# Patient Record
Sex: Female | Born: 1940 | Race: White | Hispanic: No | Marital: Married | Smoking: Former smoker
Health system: Southern US, Community
[De-identification: ages and names within clinical notes are randomized; demographics above are authoritative.]

## PROBLEM LIST (undated history)

## (undated) DIAGNOSIS — E785 Hyperlipidemia, unspecified: Secondary | ICD-10-CM

## (undated) DIAGNOSIS — N200 Calculus of kidney: Secondary | ICD-10-CM

## (undated) DIAGNOSIS — Z87442 Personal history of urinary calculi: Secondary | ICD-10-CM

## (undated) DIAGNOSIS — M8580 Other specified disorders of bone density and structure, unspecified site: Secondary | ICD-10-CM

## (undated) DIAGNOSIS — Z955 Presence of coronary angioplasty implant and graft: Secondary | ICD-10-CM

## (undated) DIAGNOSIS — N8110 Cystocele, unspecified: Secondary | ICD-10-CM

## (undated) DIAGNOSIS — K5792 Diverticulitis of intestine, part unspecified, without perforation or abscess without bleeding: Secondary | ICD-10-CM

## (undated) DIAGNOSIS — T8859XA Other complications of anesthesia, initial encounter: Secondary | ICD-10-CM

## (undated) DIAGNOSIS — M1990 Unspecified osteoarthritis, unspecified site: Secondary | ICD-10-CM

## (undated) DIAGNOSIS — H43819 Vitreous degeneration, unspecified eye: Secondary | ICD-10-CM

## (undated) DIAGNOSIS — T4145XA Adverse effect of unspecified anesthetic, initial encounter: Secondary | ICD-10-CM

## (undated) DIAGNOSIS — C50919 Malignant neoplasm of unspecified site of unspecified female breast: Secondary | ICD-10-CM

## (undated) DIAGNOSIS — G43909 Migraine, unspecified, not intractable, without status migrainosus: Secondary | ICD-10-CM

## (undated) DIAGNOSIS — K2970 Gastritis, unspecified, without bleeding: Secondary | ICD-10-CM

## (undated) DIAGNOSIS — K649 Unspecified hemorrhoids: Secondary | ICD-10-CM

## (undated) DIAGNOSIS — I2510 Atherosclerotic heart disease of native coronary artery without angina pectoris: Secondary | ICD-10-CM

## (undated) DIAGNOSIS — I639 Cerebral infarction, unspecified: Secondary | ICD-10-CM

## (undated) DIAGNOSIS — K219 Gastro-esophageal reflux disease without esophagitis: Secondary | ICD-10-CM

## (undated) DIAGNOSIS — N281 Cyst of kidney, acquired: Secondary | ICD-10-CM

## (undated) DIAGNOSIS — H269 Unspecified cataract: Secondary | ICD-10-CM

## (undated) DIAGNOSIS — D1720 Benign lipomatous neoplasm of skin and subcutaneous tissue of unspecified limb: Secondary | ICD-10-CM

## (undated) DIAGNOSIS — I38 Endocarditis, valve unspecified: Secondary | ICD-10-CM

## (undated) DIAGNOSIS — K529 Noninfective gastroenteritis and colitis, unspecified: Secondary | ICD-10-CM

## (undated) HISTORY — DX: Hyperlipidemia, unspecified: E78.5

## (undated) HISTORY — DX: Unspecified hemorrhoids: K64.9

## (undated) HISTORY — PX: PARTIAL HYSTERECTOMY: SHX80

## (undated) HISTORY — DX: Cerebral infarction, unspecified: I63.9

## (undated) HISTORY — PX: EYE SURGERY: SHX253

## (undated) HISTORY — DX: Other specified disorders of bone density and structure, unspecified site: M85.80

## (undated) HISTORY — DX: Malignant neoplasm of unspecified site of unspecified female breast: C50.919

## (undated) HISTORY — PX: CORONARY ANGIOPLASTY: SHX604

## (undated) HISTORY — DX: Cystocele, unspecified: N81.10

## (undated) HISTORY — DX: Vitreous degeneration, unspecified eye: H43.819

## (undated) HISTORY — PX: UPPER GASTROINTESTINAL ENDOSCOPY: SHX188

## (undated) HISTORY — PX: CARDIAC CATHETERIZATION: SHX172

## (undated) HISTORY — DX: Migraine, unspecified, not intractable, without status migrainosus: G43.909

## (undated) HISTORY — DX: Unspecified cataract: H26.9

## (undated) HISTORY — DX: Noninfective gastroenteritis and colitis, unspecified: K52.9

## (undated) HISTORY — PX: ABDOMINAL HYSTERECTOMY: SHX81

## (undated) HISTORY — DX: Calculus of kidney: N20.0

## (undated) HISTORY — PX: BREAST CYST ASPIRATION: SHX578

## (undated) HISTORY — PX: COLONOSCOPY: SHX174

## (undated) HISTORY — DX: Presence of coronary angioplasty implant and graft: Z95.5

## (undated) HISTORY — PX: FOOT FRACTURE SURGERY: SHX645

## (undated) HISTORY — DX: Gastritis, unspecified, without bleeding: K29.70

## (undated) HISTORY — DX: Diverticulitis of intestine, part unspecified, without perforation or abscess without bleeding: K57.92

## (undated) HISTORY — PX: CATARACT EXTRACTION: SUR2

## (undated) HISTORY — DX: Benign lipomatous neoplasm of skin and subcutaneous tissue of unspecified limb: D17.20

## (undated) HISTORY — DX: Unspecified osteoarthritis, unspecified site: M19.90

## (undated) HISTORY — DX: Gastro-esophageal reflux disease without esophagitis: K21.9

## (undated) HISTORY — PX: BREAST SURGERY: SHX581

## (undated) HISTORY — DX: Cyst of kidney, acquired: N28.1

## (undated) HISTORY — DX: Endocarditis, valve unspecified: I38

---

## 2004-07-28 ENCOUNTER — Ambulatory Visit: Payer: Self-pay | Admitting: Internal Medicine

## 2004-12-07 ENCOUNTER — Ambulatory Visit: Payer: Self-pay

## 2005-06-08 ENCOUNTER — Ambulatory Visit: Payer: Self-pay

## 2005-06-22 ENCOUNTER — Ambulatory Visit: Payer: Self-pay | Admitting: General Surgery

## 2005-08-14 DIAGNOSIS — C50919 Malignant neoplasm of unspecified site of unspecified female breast: Secondary | ICD-10-CM

## 2005-08-14 HISTORY — PX: OTHER SURGICAL HISTORY: SHX169

## 2005-08-14 HISTORY — DX: Malignant neoplasm of unspecified site of unspecified female breast: C50.919

## 2005-08-14 HISTORY — PX: BREAST BIOPSY: SHX20

## 2005-10-11 ENCOUNTER — Ambulatory Visit: Payer: Self-pay | Admitting: General Surgery

## 2006-01-10 ENCOUNTER — Ambulatory Visit: Payer: Self-pay | Admitting: General Surgery

## 2006-01-30 ENCOUNTER — Ambulatory Visit: Payer: Self-pay | Admitting: Orthopedic Surgery

## 2006-02-15 ENCOUNTER — Ambulatory Visit: Payer: Self-pay

## 2006-08-13 ENCOUNTER — Ambulatory Visit: Payer: Self-pay | Admitting: Internal Medicine

## 2007-08-15 DIAGNOSIS — D1720 Benign lipomatous neoplasm of skin and subcutaneous tissue of unspecified limb: Secondary | ICD-10-CM

## 2007-08-15 HISTORY — DX: Benign lipomatous neoplasm of skin and subcutaneous tissue of unspecified limb: D17.20

## 2007-08-15 HISTORY — PX: BREAST LUMPECTOMY: SHX2

## 2007-09-05 ENCOUNTER — Ambulatory Visit: Payer: Self-pay | Admitting: Internal Medicine

## 2007-10-09 ENCOUNTER — Ambulatory Visit: Payer: Self-pay | Admitting: Unknown Physician Specialty

## 2008-01-01 ENCOUNTER — Ambulatory Visit: Payer: Self-pay | Admitting: Physician Assistant

## 2008-01-23 ENCOUNTER — Ambulatory Visit: Payer: Self-pay | Admitting: Surgery

## 2008-02-10 ENCOUNTER — Ambulatory Visit: Payer: Self-pay | Admitting: General Surgery

## 2008-02-12 ENCOUNTER — Ambulatory Visit: Payer: Self-pay | Admitting: Internal Medicine

## 2008-03-02 ENCOUNTER — Ambulatory Visit: Payer: Self-pay | Admitting: Internal Medicine

## 2008-03-14 ENCOUNTER — Ambulatory Visit: Payer: Self-pay | Admitting: Internal Medicine

## 2008-03-26 ENCOUNTER — Other Ambulatory Visit: Payer: Self-pay

## 2008-03-27 ENCOUNTER — Ambulatory Visit: Payer: Self-pay | Admitting: Vascular Surgery

## 2008-04-14 ENCOUNTER — Ambulatory Visit: Payer: Self-pay | Admitting: Internal Medicine

## 2008-05-14 ENCOUNTER — Ambulatory Visit: Payer: Self-pay | Admitting: Internal Medicine

## 2008-06-14 ENCOUNTER — Ambulatory Visit: Payer: Self-pay | Admitting: Internal Medicine

## 2008-07-14 ENCOUNTER — Ambulatory Visit: Payer: Self-pay | Admitting: Internal Medicine

## 2008-08-14 ENCOUNTER — Ambulatory Visit: Payer: Self-pay | Admitting: Internal Medicine

## 2008-09-14 ENCOUNTER — Ambulatory Visit: Payer: Self-pay | Admitting: Internal Medicine

## 2008-10-12 ENCOUNTER — Ambulatory Visit: Payer: Self-pay | Admitting: Internal Medicine

## 2008-11-12 ENCOUNTER — Ambulatory Visit: Payer: Self-pay | Admitting: Internal Medicine

## 2008-12-12 ENCOUNTER — Ambulatory Visit: Payer: Self-pay | Admitting: Internal Medicine

## 2009-01-12 ENCOUNTER — Ambulatory Visit: Payer: Self-pay | Admitting: Internal Medicine

## 2009-02-11 ENCOUNTER — Ambulatory Visit: Payer: Self-pay | Admitting: Internal Medicine

## 2009-03-14 ENCOUNTER — Ambulatory Visit: Payer: Self-pay | Admitting: Internal Medicine

## 2009-04-14 ENCOUNTER — Ambulatory Visit: Payer: Self-pay | Admitting: Internal Medicine

## 2009-05-14 ENCOUNTER — Ambulatory Visit: Payer: Self-pay | Admitting: Internal Medicine

## 2009-05-26 ENCOUNTER — Ambulatory Visit: Payer: Self-pay | Admitting: Internal Medicine

## 2009-06-14 ENCOUNTER — Ambulatory Visit: Payer: Self-pay | Admitting: Internal Medicine

## 2009-07-14 ENCOUNTER — Ambulatory Visit: Payer: Self-pay | Admitting: Radiation Oncology

## 2009-07-14 ENCOUNTER — Ambulatory Visit: Payer: Self-pay | Admitting: Internal Medicine

## 2009-08-14 ENCOUNTER — Ambulatory Visit: Payer: Self-pay | Admitting: Radiation Oncology

## 2009-08-26 ENCOUNTER — Ambulatory Visit: Payer: Self-pay | Admitting: Internal Medicine

## 2009-09-14 ENCOUNTER — Ambulatory Visit: Payer: Self-pay | Admitting: Internal Medicine

## 2009-10-12 ENCOUNTER — Ambulatory Visit: Payer: Self-pay | Admitting: Internal Medicine

## 2009-11-12 ENCOUNTER — Ambulatory Visit: Payer: Self-pay | Admitting: Internal Medicine

## 2009-12-12 ENCOUNTER — Ambulatory Visit: Payer: Self-pay | Admitting: Internal Medicine

## 2010-01-12 ENCOUNTER — Ambulatory Visit: Payer: Self-pay | Admitting: Internal Medicine

## 2010-02-11 ENCOUNTER — Ambulatory Visit: Payer: Self-pay | Admitting: Internal Medicine

## 2010-03-14 ENCOUNTER — Ambulatory Visit: Payer: Self-pay | Admitting: Internal Medicine

## 2010-04-14 ENCOUNTER — Ambulatory Visit: Payer: Self-pay | Admitting: Internal Medicine

## 2010-05-14 ENCOUNTER — Ambulatory Visit: Payer: Self-pay | Admitting: Internal Medicine

## 2010-06-14 ENCOUNTER — Ambulatory Visit: Payer: Self-pay | Admitting: Internal Medicine

## 2010-06-16 ENCOUNTER — Ambulatory Visit: Payer: Self-pay | Admitting: Internal Medicine

## 2010-07-14 ENCOUNTER — Ambulatory Visit: Payer: Self-pay | Admitting: Internal Medicine

## 2010-08-14 ENCOUNTER — Ambulatory Visit: Payer: Self-pay | Admitting: Internal Medicine

## 2010-09-14 ENCOUNTER — Ambulatory Visit: Payer: Self-pay | Admitting: Internal Medicine

## 2010-10-13 ENCOUNTER — Ambulatory Visit: Payer: Self-pay | Admitting: Internal Medicine

## 2010-10-26 ENCOUNTER — Ambulatory Visit: Payer: Self-pay | Admitting: Ophthalmology

## 2010-12-07 ENCOUNTER — Ambulatory Visit: Payer: Self-pay | Admitting: Ophthalmology

## 2010-12-27 ENCOUNTER — Ambulatory Visit: Payer: Self-pay | Admitting: Vascular Surgery

## 2011-01-05 ENCOUNTER — Ambulatory Visit: Payer: Self-pay | Admitting: Internal Medicine

## 2011-01-13 ENCOUNTER — Ambulatory Visit: Payer: Self-pay | Admitting: Internal Medicine

## 2011-02-12 ENCOUNTER — Ambulatory Visit: Payer: Self-pay | Admitting: Internal Medicine

## 2011-03-15 ENCOUNTER — Ambulatory Visit: Payer: Self-pay | Admitting: Internal Medicine

## 2011-04-15 ENCOUNTER — Ambulatory Visit: Payer: Self-pay | Admitting: Internal Medicine

## 2011-05-15 ENCOUNTER — Ambulatory Visit: Payer: Self-pay | Admitting: Internal Medicine

## 2011-06-22 ENCOUNTER — Ambulatory Visit: Payer: Self-pay | Admitting: Internal Medicine

## 2011-07-15 ENCOUNTER — Ambulatory Visit: Payer: Self-pay | Admitting: Internal Medicine

## 2011-09-13 ENCOUNTER — Ambulatory Visit: Payer: Self-pay | Admitting: Internal Medicine

## 2011-09-15 ENCOUNTER — Ambulatory Visit: Payer: Self-pay | Admitting: Internal Medicine

## 2011-12-21 ENCOUNTER — Ambulatory Visit: Payer: Self-pay | Admitting: Internal Medicine

## 2012-01-13 ENCOUNTER — Ambulatory Visit: Payer: Self-pay | Admitting: Internal Medicine

## 2012-04-08 ENCOUNTER — Ambulatory Visit: Payer: Self-pay | Admitting: Internal Medicine

## 2012-06-20 ENCOUNTER — Ambulatory Visit: Payer: Self-pay | Admitting: Internal Medicine

## 2012-07-14 ENCOUNTER — Ambulatory Visit: Payer: Self-pay | Admitting: Internal Medicine

## 2012-08-29 ENCOUNTER — Ambulatory Visit: Payer: Self-pay | Admitting: Internal Medicine

## 2012-11-14 ENCOUNTER — Ambulatory Visit: Payer: Self-pay | Admitting: Internal Medicine

## 2012-12-12 ENCOUNTER — Ambulatory Visit: Payer: Self-pay | Admitting: Internal Medicine

## 2013-05-14 ENCOUNTER — Ambulatory Visit: Payer: Self-pay | Admitting: Internal Medicine

## 2013-05-15 ENCOUNTER — Ambulatory Visit: Payer: Self-pay | Admitting: Internal Medicine

## 2013-06-14 ENCOUNTER — Ambulatory Visit: Payer: Self-pay | Admitting: Internal Medicine

## 2013-06-19 DIAGNOSIS — I38 Endocarditis, valve unspecified: Secondary | ICD-10-CM

## 2013-06-19 HISTORY — DX: Endocarditis, valve unspecified: I38

## 2013-08-28 ENCOUNTER — Ambulatory Visit: Payer: Self-pay | Admitting: Internal Medicine

## 2013-09-14 ENCOUNTER — Ambulatory Visit: Payer: Self-pay | Admitting: Internal Medicine

## 2013-10-23 DIAGNOSIS — D649 Anemia, unspecified: Secondary | ICD-10-CM | POA: Insufficient documentation

## 2013-10-23 DIAGNOSIS — B351 Tinea unguium: Secondary | ICD-10-CM | POA: Insufficient documentation

## 2013-10-23 DIAGNOSIS — G609 Hereditary and idiopathic neuropathy, unspecified: Secondary | ICD-10-CM | POA: Insufficient documentation

## 2013-10-23 DIAGNOSIS — I10 Essential (primary) hypertension: Secondary | ICD-10-CM | POA: Insufficient documentation

## 2013-10-23 DIAGNOSIS — E785 Hyperlipidemia, unspecified: Secondary | ICD-10-CM | POA: Insufficient documentation

## 2013-12-26 ENCOUNTER — Ambulatory Visit: Payer: Self-pay | Admitting: Cardiology

## 2013-12-30 ENCOUNTER — Ambulatory Visit: Payer: Self-pay | Admitting: Cardiology

## 2013-12-30 LAB — CK TOTAL AND CKMB (NOT AT ARMC)
CK, Total: 73 U/L
CK-MB: 2.3 ng/mL (ref 0.5–3.6)

## 2013-12-31 LAB — BASIC METABOLIC PANEL
Anion Gap: 4 — ABNORMAL LOW (ref 7–16)
BUN: 10 mg/dL (ref 7–18)
CALCIUM: 8.6 mg/dL (ref 8.5–10.1)
CHLORIDE: 111 mmol/L — AB (ref 98–107)
Co2: 28 mmol/L (ref 21–32)
Creatinine: 0.87 mg/dL (ref 0.60–1.30)
EGFR (African American): >60
EGFR (Non-African Amer.): >60
GLUCOSE: 115 mg/dL — AB (ref 65–99)
OSMOLALITY: 285 (ref 275–301)
POTASSIUM: 4.5 mmol/L (ref 3.5–5.1)
Sodium: 143 mmol/L (ref 136–145)

## 2013-12-31 LAB — FOLATE: FOLIC ACID: 34.8 ng/mL (ref 3.1–100.0)

## 2013-12-31 LAB — SEDIMENTATION RATE: Erythrocyte Sed Rate: 14 mm/hr (ref 0–30)

## 2013-12-31 LAB — TSH: Thyroid Stimulating Horm: 1.7 u[IU]/mL

## 2014-01-17 DIAGNOSIS — I059 Rheumatic mitral valve disease, unspecified: Secondary | ICD-10-CM | POA: Insufficient documentation

## 2014-01-19 ENCOUNTER — Ambulatory Visit: Payer: Self-pay | Admitting: Internal Medicine

## 2014-02-27 ENCOUNTER — Ambulatory Visit: Payer: Self-pay | Admitting: Internal Medicine

## 2014-03-14 ENCOUNTER — Ambulatory Visit: Payer: Self-pay | Admitting: Internal Medicine

## 2014-06-18 ENCOUNTER — Ambulatory Visit: Payer: Self-pay | Admitting: Internal Medicine

## 2014-07-06 ENCOUNTER — Ambulatory Visit: Payer: Self-pay | Admitting: Internal Medicine

## 2014-07-21 ENCOUNTER — Ambulatory Visit: Payer: Self-pay | Admitting: Internal Medicine

## 2014-07-22 DIAGNOSIS — I2510 Atherosclerotic heart disease of native coronary artery without angina pectoris: Secondary | ICD-10-CM | POA: Insufficient documentation

## 2014-08-28 ENCOUNTER — Ambulatory Visit: Payer: Self-pay | Admitting: Internal Medicine

## 2014-09-25 ENCOUNTER — Ambulatory Visit: Payer: Self-pay | Admitting: Internal Medicine

## 2014-10-13 ENCOUNTER — Ambulatory Visit
Admit: 2014-10-13 | Disposition: A | Discharge status: Home / Self Care | Payer: Self-pay | Attending: Internal Medicine | Admitting: Internal Medicine

## 2014-12-05 NOTE — Consult Note (Signed)
Referring Physician:  Teodoro Spray   Primary Care Physician:  Sharol Given, [address], [place], [place] [address], [phone]  Reason for Consult: Admit Date: 30-Dec-2013  Chief Complaint: confusion  Reason for Consult: confusion   History of Present Illness: History of Present Illness:   74 yo RHD F presents to [facility] secondary to chest pain and underwent cardiac cath with stenting.  Pt had confusion after the cath that initially improved but then fluctuated where she was trying to get out of the bed and was looking for people who were not there.  This started immediately after the cath but improved.  Pt has not returned to baseline mental status after cath.  There is no mention of weakness/numbness, loss of consciousness or shaking activity.  ROS:  General denies complaints   HEENT no complaints   Lungs no complaints   Cardiac no complaints   GI no complaints   GU no complaints   Musculoskeletal no complaints   Extremities no complaints   Skin no complaints   Neuro no complaints   Endocrine no complaints   Psych no complaints   Past Medical/Surgical Hx:  colitis:   anxiety:   HTN:   Migrane Headaches:   Subject to Nosebleed during the Winter:   Osteoporosis:   Diverticulitis:   Gastritis:   Hypercholesterolemia:   Cancer, Breast:   lt shoulder lipoma removal:   bladder tack:   hysterectomy:   lt. rotary cuff injury:   Cataract Extraction:   Hysterectomy and Bladder Tack 1998:   Past Medical/ Surgical Hx:  Past Medical History as above   Past Surgical History as above   Home Medications: Medication Instructions Last Modified Date/Time  aspirin 81 mg oral delayed release tablet 1 tab(s) orally once a day 20-May-15 06:38  ticagrelor 90 mg oral tablet 1 tab(s) orally every 12 hours 20-May-15 06:38  ticagrelor 90 mg oral tablet 1 tab(s) orally every 12 hours 20-May-15 06:38  ALPRAZolam 0.5 mg oral tablet 0.5  tab(s) 0.$RemoveB'25mg'NsWzudMy$  orally once (at bedtime) 19-May-15 13:39  gabapentin 300 mg oral capsule 1 cap(s) orally 3 times a day x 30 days 19-May-15 13:39  Fioricet oral tablet 1/2 tablet orally  as needed 19-May-15 13:39  Vitamin D3 1000 intl units oral tablet 1 tab(s) orally once a day  19-May-15 13:39  omeprazole 20 mg oral delayed release capsule 1 cap(s) orally 2 times a day 19-May-15 13:39  Muro 128 5% ophthalmic solution  to each affected eye (left eye at bedtime) 19-May-15 13:39  Zetia 10 mg oral tablet 1 tab(s) orally once a day 19-May-15 13:39   Allergies:  Zithromax: Hives, Rash  Metronidazole: Headaches  Lidocaine: Hives, Rash  Niacin: Headaches  Colestid: Headaches  Crestor: Headaches  Zocor: Headaches  Lipitor: Other  Mevacor: Other  Atelvia: Other  Allergies:  Allergies as above   Social/Family History: Employment Status: retired  Lives With: significant other  Living Arrangements: apartment  Social History: no tob, no EtOH, no illicits  Family History: no seizures or strokes   Vital Signs: **Vital Signs.:   20-May-15 14:00  Vital Signs Type Routine  Pulse Pulse 98  Pulse source if not from Vital Sign Device per cardiac monitor  Respirations Respirations 21  Systolic BP Systolic BP 443  Diastolic BP (mmHg) Diastolic BP (mmHg) 72  Mean BP 113  BP Source  if not from Vital Sign Device non-invasive  Pulse Ox % Pulse Ox % 96  Pulse  Ox Activity Level  At rest  Oxygen Delivery Room Air/ 21 %  Pulse Ox Heart Rate 96   Physical Exam: General: thin, anxious  HEENT: normocephalic, sclera nonicteric, oropharynx clear  Neck: supple, no JVD, no bruits  Chest: CTA B, no wheezing, good movement  Cardiac: RRR, no murmurs, no edema, 2+ pulses  Extremities: no C/C/E, FROM   Neurologic Exam: Mental Status: alert and oriented x 3, normal speech and language, follows complex commands  Cranial Nerves: PERRLA, EOMI, nl VF, face symmetric, tongue midline, shoulder shrug equal   Motor Exam: 5/5 B normal, tone, no tremor, mild head titubation  Deep Tendon Reflexes: 2+/4 B, plantars downgoing B, no Hoffman  Sensory Exam: pinprick, temperature, and vibration intact B  Coordination: FTN and HTS WNL, nl RAM   Lab Results: Routine Chem:  20-May-15 04:06   Glucose, Serum  115  BUN 10  Creatinine (comp) 0.87  Sodium, Serum 143  Potassium, Serum 4.5  Chloride, Serum  111  CO2, Serum 28  Calcium (Total), Serum 8.6  Anion Gap  4  Osmolality (calc) 285  eGFR (African American) >60  eGFR (Non-African American) >60 (eGFR values <86mL/min/1.73 m2 may be an indication of chronic kidney disease (CKD). Calculated eGFR is useful in patients with stable renal function. The eGFR calculation will not be reliable in acutely ill patients when serum creatinine is changing rapidly. It is not useful in  patients on dialysis. The eGFR calculation may not be applicable to patients at the low and high extremes of body sizes, pregnant women, and vegetarians.)  Cardiac:  19-May-15 20:10   CK, Total 73 (26-192 NOTE: NEW REFERENCE RANGE  09/15/2013)  CPK-MB, Serum 2.3 (Result(s) reported on 30 Dec 2013 at 08:38PM.)   Impression/Recommendations: Recommendations:   previous notes reviewed by me reviewed by me   Encephalopathy-  no clear etiology but it has improved,  concern for stroke after stenting or even seizure activity from sedation.   Will r/o toxic/metabolic causes as well. CAD-  stable after stenting MRI of brain w/o contrast EEG check TSH, B12/folate, esr continue re-assurance will follow  Electronic Signatures: Jamison Neighbor (MD)  (Signed 20-May-15 15:04)  Authored: REFERRING PHYSICIAN, Primary Care Physician, Consult, History of Present Illness, Review of Systems, PAST MEDICAL/SURGICAL HISTORY, HOME MEDICATIONS, ALLERGIES, Social/Family History, NURSING VITAL SIGNS, Physical Exam-, LAB RESULTS, Recommendations   Last Updated: 20-May-15 15:04 by Jamison Neighbor  (MD)

## 2015-03-02 ENCOUNTER — Encounter: Payer: Self-pay | Admitting: Obstetrics and Gynecology

## 2015-03-02 ENCOUNTER — Ambulatory Visit (INDEPENDENT_AMBULATORY_CARE_PROVIDER_SITE_OTHER): Payer: Medicare Other | Admitting: Obstetrics and Gynecology

## 2015-03-02 VITALS — BP 112/67 | HR 70 | Ht 63.0 in | Wt 93.7 lb

## 2015-03-02 DIAGNOSIS — K648 Other hemorrhoids: Secondary | ICD-10-CM | POA: Diagnosis not present

## 2015-03-02 DIAGNOSIS — K644 Residual hemorrhoidal skin tags: Secondary | ICD-10-CM

## 2015-03-03 NOTE — Progress Notes (Signed)
GYNECOLOGY PROGRESS NOTE  Subjective:    Patient ID: Kathleen Walls, female    DOB: 1941/01/13, 74 y.o.   MRN: [id number]  HPI  Patient is a 74 y.o. [id number] female who presents for complaints of hemorrhoid and possible vaginal bleeding.  Notes that she had been sick several weeks ago and was taking antibiotics and steroid taper.  Afterwards began noticing having to strain with stools.  Began taking stool softeners which alleviated constipation, however began noticing spotting when wiping. Denies dysuria, hematuria.  Notes that the tried to look with a mirror to see where blood was coming from and looked as though it was either coming from the perineum or anal region.  However was unsure if it could have been vaginal.  The following portions of the patient's history were reviewed and updated as appropriate: allergies, current medications, past family history, past medical history, past social history, past surgical history and problem list.  Review of Systems Pertinent items are noted in HPI.     Objective:   Blood pressure 112/67, pulse 70, height 5\' 3"  (1.6 m), weight 93 lb 11.2 oz (42.502 kg). General appearance: alert and no distress Abdomen: soft, non-tender; bowel sounds normal; no masses,  no organomegaly Pelvic: external genitalia normal, uterus surgically absent and vagina normal without discharge. Vagina with atrophic mucosa, but no blood in vault, no lacerations or masses.  Rectal: small external hemorrhoid noted. Extremities: extremities normal, atraumatic, no cyanosis or edema Neurologic: Grossly normal   Assessment:   Hemorrhoid  Plan:   Constipation currently relieved by stool softener.  Advised on increased fiber and Preparation H/Tuck's pads for hemorrhoid.  No vaginal bleeding noted.  Given reassurance.  Patient can f/u as needed.    Rubie Maid, MD [facility]

## 2015-05-06 ENCOUNTER — Inpatient Hospital Stay: Payer: Self-pay | Admitting: Internal Medicine

## 2015-05-06 ENCOUNTER — Ambulatory Visit: Payer: Self-pay

## 2015-05-11 ENCOUNTER — Encounter: Payer: Self-pay | Admitting: *Deleted

## 2015-05-13 ENCOUNTER — Inpatient Hospital Stay: Payer: Medicare Other | Admitting: Internal Medicine

## 2015-05-14 ENCOUNTER — Inpatient Hospital Stay: Payer: Medicare Other

## 2015-05-14 ENCOUNTER — Encounter: Payer: Self-pay | Admitting: Emergency Medicine

## 2015-05-14 ENCOUNTER — Emergency Department: Payer: Medicare Other

## 2015-05-14 ENCOUNTER — Inpatient Hospital Stay
Admission: EM | Admit: 2015-05-14 | Discharge: 2015-05-14 | DRG: 311 | Disposition: A | Discharge status: Home / Self Care | Payer: Medicare Other | Attending: Internal Medicine | Admitting: Internal Medicine

## 2015-05-14 ENCOUNTER — Inpatient Hospital Stay
Admit: 2015-05-14 | Discharge: 2015-05-14 | Disposition: A | Discharge status: Home / Self Care | Payer: Medicare Other | Attending: Internal Medicine | Admitting: Internal Medicine

## 2015-05-14 ENCOUNTER — Other Ambulatory Visit: Payer: Self-pay

## 2015-05-14 DIAGNOSIS — M8580 Other specified disorders of bone density and structure, unspecified site: Secondary | ICD-10-CM | POA: Diagnosis present

## 2015-05-14 DIAGNOSIS — G43909 Migraine, unspecified, not intractable, without status migrainosus: Secondary | ICD-10-CM | POA: Diagnosis present

## 2015-05-14 DIAGNOSIS — Z888 Allergy status to other drugs, medicaments and biological substances status: Secondary | ICD-10-CM

## 2015-05-14 DIAGNOSIS — F419 Anxiety disorder, unspecified: Secondary | ICD-10-CM | POA: Diagnosis present

## 2015-05-14 DIAGNOSIS — Z79899 Other long term (current) drug therapy: Secondary | ICD-10-CM

## 2015-05-14 DIAGNOSIS — Z853 Personal history of malignant neoplasm of breast: Secondary | ICD-10-CM | POA: Diagnosis not present

## 2015-05-14 DIAGNOSIS — K219 Gastro-esophageal reflux disease without esophagitis: Secondary | ICD-10-CM | POA: Diagnosis present

## 2015-05-14 DIAGNOSIS — Z881 Allergy status to other antibiotic agents status: Secondary | ICD-10-CM | POA: Diagnosis not present

## 2015-05-14 DIAGNOSIS — I208 Other forms of angina pectoris: Secondary | ICD-10-CM

## 2015-05-14 DIAGNOSIS — E785 Hyperlipidemia, unspecified: Secondary | ICD-10-CM | POA: Diagnosis present

## 2015-05-14 DIAGNOSIS — G609 Hereditary and idiopathic neuropathy, unspecified: Secondary | ICD-10-CM | POA: Diagnosis present

## 2015-05-14 DIAGNOSIS — H269 Unspecified cataract: Secondary | ICD-10-CM | POA: Diagnosis present

## 2015-05-14 DIAGNOSIS — I10 Essential (primary) hypertension: Secondary | ICD-10-CM | POA: Diagnosis present

## 2015-05-14 DIAGNOSIS — Z955 Presence of coronary angioplasty implant and graft: Secondary | ICD-10-CM | POA: Diagnosis not present

## 2015-05-14 DIAGNOSIS — I2510 Atherosclerotic heart disease of native coronary artery without angina pectoris: Secondary | ICD-10-CM | POA: Diagnosis present

## 2015-05-14 DIAGNOSIS — I25119 Atherosclerotic heart disease of native coronary artery with unspecified angina pectoris: Secondary | ICD-10-CM | POA: Diagnosis present

## 2015-05-14 DIAGNOSIS — Z7982 Long term (current) use of aspirin: Secondary | ICD-10-CM | POA: Diagnosis not present

## 2015-05-14 DIAGNOSIS — I248 Other forms of acute ischemic heart disease: Secondary | ICD-10-CM | POA: Diagnosis not present

## 2015-05-14 DIAGNOSIS — M1990 Unspecified osteoarthritis, unspecified site: Secondary | ICD-10-CM | POA: Diagnosis present

## 2015-05-14 DIAGNOSIS — I2089 Other forms of angina pectoris: Secondary | ICD-10-CM

## 2015-05-14 DIAGNOSIS — Z87891 Personal history of nicotine dependence: Secondary | ICD-10-CM | POA: Diagnosis not present

## 2015-05-14 DIAGNOSIS — R079 Chest pain, unspecified: Secondary | ICD-10-CM | POA: Diagnosis present

## 2015-05-14 DIAGNOSIS — I214 Non-ST elevation (NSTEMI) myocardial infarction: Secondary | ICD-10-CM | POA: Diagnosis not present

## 2015-05-14 HISTORY — DX: Atherosclerotic heart disease of native coronary artery without angina pectoris: I25.10

## 2015-05-14 LAB — NM MYOCAR MULTI W/SPECT W/WALL MOTION / EF
CHL CUP NUCLEAR SDS: 0
CHL CUP RESTING HR STRESS: 70 {beats}/min
LV dias vol: 35 mL
LV sys vol: 23 mL
Peak HR: 99 {beats}/min
SRS: 1
SSS: 0
TID: 0.7

## 2015-05-14 LAB — BASIC METABOLIC PANEL
Anion gap: 6 (ref 5–15)
BUN: 18 mg/dL (ref 6–20)
CHLORIDE: 106 mmol/L (ref 101–111)
CO2: 29 mmol/L (ref 22–32)
CREATININE: 0.77 mg/dL (ref 0.44–1.00)
Calcium: 9.7 mg/dL (ref 8.9–10.3)
GFR calc Af Amer: >60 mL/min (ref >60)
GFR calc non Af Amer: >60 mL/min (ref >60)
Glucose, Bld: 99 mg/dL (ref 65–99)
POTASSIUM: 4.1 mmol/L (ref 3.5–5.1)
SODIUM: 141 mmol/L (ref 135–145)

## 2015-05-14 LAB — TROPONIN I
TROPONIN I: 0.06 ng/mL — AB (ref <0.031)
TROPONIN I: 0.05 ng/mL — AB (ref <0.031)
Troponin I: 0.07 ng/mL — ABNORMAL HIGH (ref <0.031)

## 2015-05-14 LAB — CBC
HEMATOCRIT: 44.2 % (ref 35.0–47.0)
Hemoglobin: 14.1 g/dL (ref 12.0–16.0)
MCH: 25.7 pg — AB (ref 26.0–34.0)
MCHC: 31.9 g/dL — ABNORMAL LOW (ref 32.0–36.0)
MCV: 80.5 fL (ref 80.0–100.0)
Platelets: 251 10*3/uL (ref 150–440)
RBC: 5.49 MIL/uL — AB (ref 3.80–5.20)
RDW: 15.2 % — ABNORMAL HIGH (ref 11.5–14.5)
WBC: 7.3 10*3/uL (ref 3.6–11.0)

## 2015-05-14 LAB — FIBRIN DERIVATIVES D-DIMER (ARMC ONLY): Fibrin derivatives D-dimer (ARMC): 349 (ref 0–499)

## 2015-05-14 MED ORDER — ADULT MULTIVITAMIN W/MINERALS CH
1.0000 | ORAL_TABLET | Freq: Every day | ORAL | Status: DC
  Administered 2015-05-14: 1 via ORAL
  Filled 2015-05-14: qty 1

## 2015-05-14 MED ORDER — TECHNETIUM TC 99M SESTAMIBI - CARDIOLITE
30.0000 | Freq: Once | INTRAVENOUS | Status: AC | PRN
  Administered 2015-05-14: 30.42 via INTRAVENOUS

## 2015-05-14 MED ORDER — ASPIRIN EC 81 MG PO TBEC
81.0000 mg | DELAYED_RELEASE_TABLET | Freq: Every day | ORAL | Status: DC
  Administered 2015-05-14: 81 mg via ORAL
  Filled 2015-05-14: qty 1

## 2015-05-14 MED ORDER — ACETAMINOPHEN 325 MG PO TABS: 650.0000 mg | ORAL_TABLET | Freq: Four times a day (QID) | ORAL | Status: DC | PRN

## 2015-05-14 MED ORDER — PANTOPRAZOLE SODIUM 40 MG PO TBEC
40.0000 mg | DELAYED_RELEASE_TABLET | Freq: Every day | ORAL | Status: DC
  Administered 2015-05-14: 40 mg via ORAL
  Filled 2015-05-14: qty 1

## 2015-05-14 MED ORDER — ENOXAPARIN SODIUM 60 MG/0.6ML ~~LOC~~ SOLN: 1.0000 mg/kg | Freq: Two times a day (BID) | SUBCUTANEOUS | Status: DC

## 2015-05-14 MED ORDER — METOPROLOL TARTRATE 25 MG PO TABS
12.5000 mg | ORAL_TABLET | Freq: Two times a day (BID) | ORAL | Status: DC
  Administered 2015-05-14: 12.5 mg via ORAL
  Filled 2015-05-14: qty 1

## 2015-05-14 MED ORDER — DOCUSATE SODIUM 100 MG PO CAPS
100.0000 mg | ORAL_CAPSULE | Freq: Every day | ORAL | Status: DC
  Administered 2015-05-14: 100 mg via ORAL
  Filled 2015-05-14: qty 1

## 2015-05-14 MED ORDER — SODIUM CHLORIDE 0.9 % IJ SOLN: 3.0000 mL | INTRAMUSCULAR | Status: DC | PRN

## 2015-05-14 MED ORDER — ALPRAZOLAM 0.25 MG PO TABS: 0.1250 mg | ORAL_TABLET | Freq: Every day | ORAL | Status: DC

## 2015-05-14 MED ORDER — ENOXAPARIN SODIUM 100 MG/ML ~~LOC~~ SOLN
SUBCUTANEOUS | Status: AC
  Filled 2015-05-14: qty 1

## 2015-05-14 MED ORDER — NITROGLYCERIN 0.4 MG SL SUBL
SUBLINGUAL_TABLET | SUBLINGUAL | Status: AC
  Administered 2015-05-14: 0.4 mg via SUBLINGUAL
  Filled 2015-05-14: qty 1

## 2015-05-14 MED ORDER — CALCIUM CARBONATE ANTACID 500 MG PO CHEW
1.0000 | CHEWABLE_TABLET | Freq: Every day | ORAL | Status: DC
  Administered 2015-05-14: 200 mg via ORAL
  Filled 2015-05-14: qty 1

## 2015-05-14 MED ORDER — SODIUM CHLORIDE 0.9 % IJ SOLN: 3.0000 mL | Freq: Two times a day (BID) | INTRAMUSCULAR | Status: DC

## 2015-05-14 MED ORDER — EZETIMIBE 10 MG PO TABS
10.0000 mg | ORAL_TABLET | Freq: Every day | ORAL | Status: DC
  Administered 2015-05-14: 10 mg via ORAL
  Filled 2015-05-14 (×2): qty 1

## 2015-05-14 MED ORDER — ASPIRIN 81 MG PO CHEW
324.0000 mg | CHEWABLE_TABLET | Freq: Once | ORAL | Status: AC
  Administered 2015-05-14: 324 mg via ORAL

## 2015-05-14 MED ORDER — NITROGLYCERIN 0.4 MG SL SUBL
0.4000 mg | SUBLINGUAL_TABLET | SUBLINGUAL | Status: DC | PRN
  Administered 2015-05-14: 0.4 mg via SUBLINGUAL

## 2015-05-14 MED ORDER — ASPIRIN 81 MG PO CHEW
CHEWABLE_TABLET | ORAL | Status: AC
  Administered 2015-05-14: 324 mg via ORAL
  Filled 2015-05-14: qty 4

## 2015-05-14 MED ORDER — SODIUM CHLORIDE 0.9 % IJ SOLN
3.0000 mL | Freq: Two times a day (BID) | INTRAMUSCULAR | Status: DC
  Administered 2015-05-14: 3 mL via INTRAVENOUS

## 2015-05-14 MED ORDER — ASPIRIN EC 81 MG PO TBEC: 81.0000 mg | DELAYED_RELEASE_TABLET | Freq: Every day | ORAL | Status: DC

## 2015-05-14 MED ORDER — INFLUENZA VAC SPLIT QUAD 0.5 ML IM SUSY: 0.5000 mL | PREFILLED_SYRINGE | INTRAMUSCULAR | Status: DC

## 2015-05-14 MED ORDER — GABAPENTIN 300 MG PO CAPS
300.0000 mg | ORAL_CAPSULE | Freq: Three times a day (TID) | ORAL | Status: DC
  Administered 2015-05-14 (×2): 300 mg via ORAL
  Filled 2015-05-14 (×2): qty 1

## 2015-05-14 MED ORDER — REGADENOSON 0.4 MG/5ML IV SOLN
0.4000 mg | Freq: Once | INTRAVENOUS | Status: AC
  Administered 2015-05-14: 0.4 mg via INTRAVENOUS
  Filled 2015-05-14: qty 5

## 2015-05-14 MED ORDER — SODIUM CHLORIDE 0.9 % IV SOLN: 250.0000 mL | INTRAVENOUS | Status: DC | PRN

## 2015-05-14 MED ORDER — TECHNETIUM TC 99M SESTAMIBI - CARDIOLITE
13.0000 | Freq: Once | INTRAVENOUS | Status: AC | PRN
  Administered 2015-05-14: 12.04 via INTRAVENOUS

## 2015-05-14 MED ORDER — ACETAMINOPHEN 650 MG RE SUPP: 650.0000 mg | Freq: Four times a day (QID) | RECTAL | Status: DC | PRN

## 2015-05-14 MED ORDER — ENOXAPARIN SODIUM 40 MG/0.4ML ~~LOC~~ SOLN
40.0000 mg | Freq: Once | SUBCUTANEOUS | Status: AC
  Administered 2015-05-14: 40 mg via SUBCUTANEOUS
  Filled 2015-05-14: qty 0.4

## 2015-05-14 NOTE — ED Provider Notes (Signed)
[facility] Emergency Department Provider Note  ____________________________________________  Time seen: 4:00 AM  I have reviewed the triage vital signs and the nursing notes.   HISTORY  Chief Complaint Chest Pain      HPI Kathleen Walls is a 74 y.o. female presents with acute onset of left chest pain on awakening approximately one hour ago. Patient states that the pain is nonradiating. Patient does admit to dyspnea no diaphoresis no nausea or vomiting. Patient states current pain score is 7 out of 10. Of note patient has a history of a cardiac stent performed by Dr. Ubaldo Glassing as well as valvular heart disease.     Past Medical History  Diagnosis Date  . Migraine   . Diverticulitis   . Gastritis   . Hyperlipemia   . Colitis   . GERD (gastroesophageal reflux disease)   . Osteoarthritis   . Hemorrhoid   . 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 Kidney cysts     Left  . Kidney stones   . 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 Breast cancer 2009     2009 breast cancer stage IIA T2 N0, ER positive, HER-2 negative, 2.5 cm primary tumor, poorly differentiated, status post wide local excision, one sentinel lymph node negative, high-risk Oncotype score 48  . Lipoma of shoulder 2009    left  . Hemorrhoids   . Arthritis   . Osteopenia   . Cataracts, both eyes   . Detached vitreous humor     Patient Active Problem List   Diagnosis Date Noted  . Arteriosclerosis of coronary artery 07/22/2014  . Disorder of mitral valve 01/17/2014  . Absolute anemia 10/23/2013  . Benign hypertension 10/23/2013  . Dermatophytic onychia 10/23/2013  . HLD (hyperlipidemia) 10/23/2013  . Idiopathic peripheral neuropathy 10/23/2013    Past Surgical History  Procedure Laterality Date  . Benign breast biopsy  2007  . Partial hysterectomy    . Upper gastrointestinal endoscopy    . Colonoscopy       Current Outpatient Rx  Name  Route  Sig  Dispense  Refill  . ALPRAZolam (XANAX) 0.25 MG tablet      TAKE ONE-HALF TABLET BY MOUTH AT BEDTIME         . aspirin EC 81 MG tablet   Oral   Take by mouth.         . Azelaic Acid 15 % cream   Topical   Apply topically.         . butalbital-aspirin-caffeine (FIORINAL) 50-325-40 MG per capsule   Oral   Take by mouth.         . calcium carbonate (TUMS - DOSED IN MG ELEMENTAL CALCIUM) 500 MG chewable tablet   Oral   Chew by mouth.         . Cholecalciferol (VITAMIN D-1000 MAX ST) 1000 UNITS tablet   Oral   Take by mouth.         . docusate sodium (STOOL SOFTENER) 100 MG capsule   Oral   Take by mouth.         . ezetimibe (ZETIA) 10 MG tablet   Oral   Take by mouth.         . gabapentin (NEURONTIN) 300 MG capsule      TAKE ONE CAPSULE BY MOUTH THREE TIMES DAILY         .  Multiple Vitamin (MULTI-VITAMINS) TABS   Oral   Take by mouth.         Marland Kitchen omeprazole (PRILOSEC) 20 MG capsule   Oral   Take 20 mg by mouth 2 (two) times daily before a meal.           Allergies Alendronate sodium; Atorvastatin; Azithromycin; Ciprofloxacin; Colestipol; Lidocaine; Lovastatin; Metronidazole; Niacin; Risedronate sodium; Rosuvastatin; and Simvastatin  Family History  Problem Relation Age of Onset  . Heart disease Mother   . Diabetes Father   . Heart disease Father   . Diabetes Sister   . Diabetes Brother   . Cervical cancer Cousin   . Lung cancer Paternal Aunt   . Prostate cancer Maternal Uncle   . Leukemia    . Hypertension      Social History Social History  Substance Use Topics  . Smoking status: Former Smoker    Types: Cigarettes    Quit date: 08/14/1993  . Smokeless tobacco: Never Used  . Alcohol Use: No    Review of Systems  Constitutional: Negative for fever. Eyes: Negative for visual changes. ENT: Negative for sore throat. Cardiovascular: Positive for chest pain. Respiratory: Negative  for shortness of breath. Gastrointestinal: Negative for abdominal pain, vomiting and diarrhea. Genitourinary: Negative for dysuria. Musculoskeletal: Negative for back pain. Skin: Negative for rash. Neurological: Negative for headaches, focal weakness or numbness.   10-point ROS otherwise negative.  ____________________________________________   PHYSICAL EXAM:  VITAL SIGNS: ED Triage Vitals  Enc Vitals Group     BP 05/14/15 0407 150/98 mmHg     Pulse Rate 05/14/15 0407 72     Resp 05/14/15 0407 18     Temp 05/14/15 0407 97.6 F (36.4 C)     Temp Source 05/14/15 0407 Oral     SpO2 05/14/15 0407 98 %     Weight 05/14/15 0404 93 lb (42.185 kg)     Height 05/14/15 0404 $RemoveBefor'5\' 3"'WHQKsqSEecaH$  (1.6 m)     Head Cir --      Peak Flow --      Pain Score 05/14/15 0407 5     Pain Loc --      Pain Edu? --      Excl. in [place]? --      Constitutional: Alert and oriented. Well appearing and in no distress. Eyes: Conjunctivae are normal. PERRL. Normal extraocular movements. ENT   Head: Normocephalic and atraumatic.   Nose: No congestion/rhinnorhea.   Mouth/Throat: Mucous membranes are moist.   Neck: No stridor. Cardiovascular: Normal rate, regular rhythm. Normal and symmetric distal pulses are present in all extremities. No murmurs, rubs, or gallops. Respiratory: Normal respiratory effort without tachypnea nor retractions. Breath sounds are clear and equal bilaterally. No wheezes/rales/rhonchi. Gastrointestinal: Soft and nontender. No distention. There is no CVA tenderness. Genitourinary: deferred Musculoskeletal: Nontender with normal range of motion in all extremities. No joint effusions.  No lower extremity tenderness nor edema. Neurologic:  Normal speech and language. No gross focal neurologic deficits are appreciated. Speech is normal.  Skin:  Skin is warm, dry and intact. No rash noted. Psychiatric: Mood and affect are normal. Speech and behavior are normal. Patient exhibits  appropriate insight and judgment.  ____________________________________________    LABS (pertinent positives/negatives)  Labs Reviewed  CBC - Abnormal; Notable for the following:    RBC 5.49 (*)    MCH 25.7 (*)    MCHC 31.9 (*)    RDW 15.2 (*)    All other components within normal limits  TROPONIN I - Abnormal; Notable for the following:    Troponin I 0.07 (*)    All other components within normal limits  BASIC METABOLIC PANEL  FIBRIN DERIVATIVES D-DIMER ([facility] ONLY)     ____________________________________________   EKG  ED ECG REPORT I, BROWN, [place] N, the attending physician, personally viewed and interpreted this ECG.   Date: 05/14/2015  EKG Time: 4:07 AM  Rate: 77  Rhythm: Normal sinus rhythm  Axis: Normal  Intervals: Normal  ST&T Change: None   ____________________________________________    RADIOLOGY      DG Chest Port 1 View (Final result) Result time: 05/14/15 04:42:34   Final result by Rad Results In Interface (05/14/15 04:42:34)   Narrative:   CLINICAL DATA: Chest pain  EXAM: PORTABLE CHEST 1 VIEW  COMPARISON: 07/21/2014 CT  FINDINGS: Normal heart size and aortic contour. Mass at the right cardiophrenic sulcus is an incidental pericardial cyst based on 2015 CT. Generous lung volumes with no emphysema on previous CT. There is no edema, consolidation, effusion, or pneumothorax.  There are 2 rounded densities overlapping the proximal right humerus which are not seen on 2011 chest x-ray. These are favored artifactual but indeterminate.  IMPRESSION: 1. No evidence of acute cardiopulmonary disease. 2. 2 densities over the right humerus are favored artifactual, but dedicated humerus radiography is recommended to exclude new sclerotic lesions.   Electronically Signed By: Monte Fantasia M.D. On: 05/14/2015 04:42       ____________________________________________   PROCEDURES    Critical Care performed: CRITICAL  CARE Performed by: Marjean Donna N   Total critical care time: 30 minutes  Critical care time was exclusive of separately billable procedures and treating other patients.  Critical care was necessary to treat or prevent imminent or life-threatening deterioration.  Critical care was time spent personally by me on the following activities: development of treatment plan with patient and/or surrogate as well as nursing, discussions with consultants, evaluation of patient's response to treatment, examination of patient, obtaining history from patient or surrogate, ordering and performing treatments and interventions, ordering and review of laboratory studies, ordering and review of radiographic studies, pulse oximetry and re-evaluation of patient's condition.   ____________________________________________   INITIAL IMPRESSION / ASSESSMENT AND PLAN / ED COURSE  Pertinent labs & imaging results that were available during my care of the patient were reviewed by me and considered in my medical decision making (see chart for details).    ____________________________________________   FINAL CLINICAL IMPRESSION(S) / ED DIAGNOSES  Final diagnoses:  NSTEMI (non-ST elevated myocardial infarction)        Gregor Hams, MD 05/14/15 [id number]

## 2015-05-14 NOTE — Progress Notes (Signed)
Kathleen Walls is a 74 y.o. female  Chest pain   SUBJECTIVE:  Pt admitted with CP and SOB with troponin=0.07. Currently pain-free. Has hx of PTCA. EKG non-acute. CXR stable.  ______________________________________________________________________  ROS: Review of systems is unremarkable for any active cardiac,respiratory, GI, GU, hematologic, neurologic or psychiatric systems, 10 systems reviewed.  $RemoveBe'@CMEDLIST'IlwHktCjj$ @  Past Medical History  Diagnosis Date  . Migraine   . Diverticulitis   . Gastritis   . Hyperlipemia   . Colitis   . GERD (gastroesophageal reflux disease)   . Osteoarthritis   . Hemorrhoid   . 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 Kidney cysts     Left  . Kidney stones   . 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 Breast cancer 2009     2009 breast cancer stage IIA T2 N0, ER positive, HER-2 negative, 2.5 cm primary tumor, poorly differentiated, status post wide local excision, one sentinel lymph node negative, high-risk Oncotype score 48  . Lipoma of shoulder 2009    left  . Hemorrhoids   . Arthritis   . Osteopenia   . Cataracts, both eyes   . Detached vitreous humor   . Coronary artery disease   . Hypertension     Past Surgical History  Procedure Laterality Date  . Benign breast biopsy  2007  . Partial hysterectomy    . Upper gastrointestinal endoscopy    . Colonoscopy    . Abdominal hysterectomy    . Breast surgery    . Eye surgery    . Coronary angioplasty      PHYSICAL EXAM:  BP 159/70 mmHg  Pulse 72  Temp(Src) 98 F (36.7 C) (Oral)  Resp 18  Ht $R'5\' 3"'or$  (1.6 m)  Wt 43.3 kg (95 lb 7.4 oz)  BMI 16.91 kg/m2  SpO2 99%  Wt Readings from Last 3 Encounters:  05/14/15 43.3 kg (95 lb 7.4 oz)  03/02/15 42.502 kg (93 lb 11.2 oz)            Constitutional: NAD Neck: supple, no thyromegaly Respiratory: CTA, no rales or wheezes Cardiovascular: RRR, no murmur, no gallop Abdomen:  soft, good BS, nontender Extremities: no edema Neuro: alert and oriented, no focal motor or sensory deficits  ASSESSMENT/PLAN:  Labs and imaging studies were reviewed  Will follow enzymes. Echo and Cardiology consult today. Plans per Cardiology.

## 2015-05-14 NOTE — Progress Notes (Signed)
Discharge instructions explained to pt/ verbalized an understanding/ iv and tele removed/ transported off uint via wheelchair.

## 2015-05-14 NOTE — H&P (Signed)
[facility] NAME: Kathleen Walls    MR#:  [id number]  DATE OF BIRTH:  29-Nov-1940  DATE OF ADMISSION:  05/14/2015  PRIMARY CARE PHYSICIAN: Idelle Crouch, MD   REQUESTING/REFERRING PHYSICIAN: Dr. Owens Shark  CHIEF COMPLAINT:   Chief Complaint  Patient presents with  . Chest Pain    HISTORY OF PRESENT ILLNESS:  Kathleen Walls  is a 74 y.o. female with a known history of coronary artery disease status post RCA stent, hypertension, hyperlipidemia, idiopathic peripheral neuropathy presents to the emergency room with the complaints of acute onset of chest pain that woke her up from sleep. Chest pain is pressure type in nature, no radiation of pain, associated with shortness of breath and nausea. No associated palpitations, dizziness, vomitings. Evaluation in the ED revealed stable vital signs and lab work was significant for elevated troponin of 0.07. Chest x-ray negative for acute cardiopulmonary pathology. EKG normal sinus rhythm with ventricular rate of 77 bpm. Patient received aspirin and sublingual nitroglycerin following which her chest pain resolved completely and remained chest pain-free at this time. Patient was also started on therapeutic dose of subcutaneous Lovenox and hospitalist service was consulted for further management.  PAST MEDICAL HISTORY:   Past Medical History  Diagnosis Date  . Migraine   . Diverticulitis   . Gastritis   . Hyperlipemia   . Colitis   . GERD (gastroesophageal reflux disease)   . Osteoarthritis   . Hemorrhoid   . 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 Kidney cysts     Left  . Kidney stones   . 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 Breast cancer 2009     2009 breast cancer stage IIA T2 N0, ER positive, HER-2 negative, 2.5 cm primary tumor, poorly differentiated, status post wide local excision, one sentinel lymph node  negative, high-risk Oncotype score 48  . Lipoma of shoulder 2009    left  . Hemorrhoids   . Arthritis   . Osteopenia   . Cataracts, both eyes   . Detached vitreous humor   . Coronary artery disease   . Hypertension     PAST SURGICAL HISTORY:   Past Surgical History  Procedure Laterality Date  . Benign breast biopsy  2007  . Partial hysterectomy    . Upper gastrointestinal endoscopy    . Colonoscopy    . Abdominal hysterectomy    . Breast surgery    . Eye surgery    . Coronary angioplasty      SOCIAL HISTORY:   Social History  Substance Use Topics  . Smoking status: Former Smoker    Types: Cigarettes    Quit date: 08/14/1993  . Smokeless tobacco: Never Used  . Alcohol Use: No    FAMILY HISTORY:   Family History  Problem Relation Age of Onset  . Heart disease Mother   . Diabetes Father   . Heart disease Father   . Diabetes Sister   . Diabetes Brother   . Cervical cancer Cousin   . Lung cancer Paternal Aunt   . Prostate cancer Maternal Uncle   . Leukemia    . Hypertension      DRUG ALLERGIES:   Allergies  Allergen Reactions  . Alendronate Sodium     Other reaction(s): Unknown  . Atorvastatin     Other reaction(s): Muscle Pain  .  Azithromycin Hives  . Ciprofloxacin     Other reaction(s): Headache  . Colestipol     Other reaction(s): Unknown  . Lidocaine Hives  . Lovastatin     Other reaction(s): Headache  . Metronidazole     Other reaction(s): Headache  . Niacin     Other reaction(s): Unknown  . Risedronate Sodium Other (See Comments)  . Rosuvastatin     Other reaction(s): Unknown  . Simvastatin     Other reaction(s): Headache    REVIEW OF SYSTEMS:   Review of Systems  Constitutional: Negative for fever, chills and malaise/fatigue.  HENT: Negative for ear pain, hearing loss, nosebleeds, sore throat and tinnitus.   Eyes: Negative for blurred vision, double vision, pain, discharge and redness.  Respiratory: Positive for shortness of  breath. Negative for cough, hemoptysis, sputum production and wheezing.   Cardiovascular: Positive for chest pain. Negative for palpitations, orthopnea and leg swelling.  Gastrointestinal: Positive for nausea. Negative for vomiting, abdominal pain, diarrhea, constipation, blood in stool and melena.  Genitourinary: Negative for dysuria, urgency, frequency and hematuria.  Musculoskeletal: Negative for back pain, joint pain and neck pain.  Skin: Negative for itching and rash.  Neurological: Negative for dizziness, tingling, sensory change, focal weakness and seizures.  Endo/Heme/Allergies: Does not bruise/bleed easily.  Psychiatric/Behavioral: Negative for depression. The patient is not nervous/anxious.     MEDICATIONS AT HOME:   Prior to Admission medications   Medication Sig Start Date End Date Taking? Authorizing Arraya Buck  ALPRAZolam Duanne Moron) 0.25 MG tablet TAKE ONE-HALF TABLET BY MOUTH AT BEDTIME 01/17/15  Yes Historical Deziyah Arvin, MD  aspirin EC 81 MG tablet Take by mouth.   Yes Historical Svetlana Bagby, MD  Azelaic Acid 15 % cream Apply topically.   Yes Historical Trianna Lupien, MD  butalbital-aspirin-caffeine [facility]) 50-325-40 MG per capsule Take by mouth.   Yes Historical Zohaib Heeney, MD  calcium carbonate (TUMS - DOSED IN MG ELEMENTAL CALCIUM) 500 MG chewable tablet Chew by mouth.   Yes Historical Sani Madariaga, MD  Cholecalciferol (VITAMIN D-1000 MAX ST) 1000 UNITS tablet Take by mouth.   Yes Historical Seleena Reimers, MD  docusate sodium (STOOL SOFTENER) 100 MG capsule Take by mouth.   Yes Historical Nyjai Graff, MD  ezetimibe (ZETIA) 10 MG tablet Take by mouth. 03/01/15  Yes Historical Jamyron Redd, MD  gabapentin (NEURONTIN) 300 MG capsule TAKE ONE CAPSULE BY MOUTH THREE TIMES DAILY 01/09/15  Yes Historical Law Corsino, MD  Multiple Vitamin (MULTI-VITAMINS) TABS Take by mouth.   Yes Historical Rocco Kerkhoff, MD  omeprazole (PRILOSEC) 20 MG capsule Take 20 mg by mouth 2 (two) times daily before a meal.   Yes Historical  Kalasia Crafton, MD      VITAL SIGNS:  Blood pressure 185/71, pulse 69, temperature 97.6 F (36.4 C), temperature source Oral, resp. rate 21, height $RemoveBe'5\' 3"'GKHarbUkU$  (1.6 m), weight 43.3 kg (95 lb 7.4 oz), SpO2 100 %.  PHYSICAL EXAMINATION:  Physical Exam  Constitutional: She is oriented to person, place, and time. She appears well-developed and well-nourished. No distress.  HENT:  Head: Normocephalic and atraumatic.  Right Ear: External ear normal.  Left Ear: External ear normal.  Nose: Nose normal.  Mouth/Throat: Oropharynx is clear and moist. No oropharyngeal exudate.  Eyes: EOM are normal. Pupils are equal, round, and reactive to light. No scleral icterus.  Neck: Normal range of motion. Neck supple. No JVD present. No thyromegaly present.  Cardiovascular: Normal rate, regular rhythm, normal heart sounds and intact distal pulses.  Exam reveals no friction rub.   No murmur heard. Respiratory:  Effort normal and breath sounds normal. No respiratory distress. She has no wheezes. She has no rales. She exhibits no tenderness.  GI: Soft. Bowel sounds are normal. She exhibits no distension and no mass. There is no tenderness. There is no rebound and no guarding.  Musculoskeletal: Normal range of motion. She exhibits no edema.  Lymphadenopathy:    She has no cervical adenopathy.  Neurological: She is alert and oriented to person, place, and time. She has normal reflexes. She displays normal reflexes. No cranial nerve deficit. She exhibits normal muscle tone.  Skin: Skin is warm. No rash noted. No erythema.  Psychiatric: She has a normal mood and affect. Her behavior is normal. Thought content normal.   LABORATORY PANEL:   CBC  Recent Labs Lab 05/14/15 0415  WBC 7.3  HGB 14.1  HCT 44.2  PLT 251   ------------------------------------------------------------------------------------------------------------------  Chemistries   Recent Labs Lab 05/14/15 0415  NA 141  K 4.1  CL 106  CO2 29   GLUCOSE 99  BUN 18  CREATININE 0.77  CALCIUM 9.7   ------------------------------------------------------------------------------------------------------------------  Cardiac Enzymes  Recent Labs Lab 05/14/15 0415  TROPONINI 0.07*   ------------------------------------------------------------------------------------------------------------------  RADIOLOGY:  Dg Chest Port 1 View  05/14/2015   CLINICAL DATA:  Chest pain  EXAM: PORTABLE CHEST 1 VIEW  COMPARISON:  07/21/2014 CT  FINDINGS: Normal heart size and aortic contour. Mass at the right cardiophrenic sulcus is an incidental pericardial cyst based on 2015 CT. Generous lung volumes with no emphysema on previous CT. There is no edema, consolidation, effusion, or pneumothorax.  There are 2 rounded densities overlapping the proximal right humerus which are not seen on 2011 chest x-ray. These are favored artifactual but indeterminate.  IMPRESSION: 1. No evidence of acute cardiopulmonary disease. 2. 2 densities over the right humerus are favored artifactual, but dedicated humerus radiography is recommended to exclude new sclerotic lesions.   Electronically Signed   By: Monte Fantasia M.D.   On: 05/14/2015 04:42    EKG:   Orders placed or performed during the hospital encounter of 05/14/15  . ED EKG  . ED EKG  Normal sinus rhythm with ventricular rate of 77 bpm.  IMPRESSION AND PLAN:   1. Left-sided chest pain with associated shortness of breath, elevated troponin of 0.07-non-STEMI. 2. Coronary artery disease status post RCA stent in the past. 3. Hypertension. 4. Hyperlipidemia, on Zetia. History of statin intolerance. 5. Idiopathic peripheral neuropathy, on gabapentin. Plan: Admit to telemetry, continue therapeutic Lovenox, aspirin, nitroglycerin when necessary, start low-dose metoprolol, cycle cardiac enzymes. Order echocardiogram and cardiology consultation for further evaluation and advice. Continue Zetia and other home  medications.  DVT prophylaxis: Subcutaneous Lovenox    All the records are reviewed and case discussed with ED Somaly Marteney. Management plans discussed with the patient, family and they are in agreement.  CODE STATUS: Full code  TOTAL TIME TAKING CARE OF THIS PATIENT: 50 minutes.    Azucena Freed N M.D on 05/14/2015 at 6:12 AM  Between 7am to 6pm - Pager - [phone]  After 6pm go to [email or web address] - password [email or web address]  Office  [phone]  CC: Primary care physician; Idelle Crouch, MD

## 2015-05-14 NOTE — Progress Notes (Signed)
Skin checked w Clois Dupes RN

## 2015-05-14 NOTE — Progress Notes (Signed)
Per Dr. Clayborn Bigness - stress test neg- Dr. Doy Hutching paged to make aware- waiting for call back

## 2015-05-14 NOTE — Discharge Instructions (Signed)
For any further symptoms contact Dr Doy Hutching

## 2015-05-14 NOTE — Care Management (Signed)
Order present for care management consult.  Patient presents from home where she lives with her husband.   Prior to this admission, patient independent in all adls, denies  problems accessing medical care or obtaining  medications.  Occasionally drives.  Does not have chronic home 02.  Has history of cardiac stents and has a leaky valve.  Cardiology consult is pending.  Currently on heparin drip for slightly elevated troponin.  At present there are no discharge needs.

## 2015-05-14 NOTE — Progress Notes (Signed)
Dr. Sabra Heck paged to make aware of neg stress test- states he would look at it

## 2015-05-14 NOTE — Consult Note (Signed)
Reason for Consult: angina coronary disease borderline Referring Physician: Dr  Kathleen Walls [name] is an 74 y.o. female.  HPI:  Patient who known coronary disease history of PCI and stent to the RCA hypertension hyperlipidemia in the previous breast cancer with chemotherapy subsequent idiopathic peripheral neuropathy presents with chest pressure tightness shortness of breath. The patient came to emergency room was found have borderline troponins EKG was nondiagnostic patient subsequently became pain free on nitroglycerin and was admitted for further evaluation placed on anticoagulation cardiologist recommend further evaluation.  Past Medical History  Diagnosis Date  . Migraine   . Diverticulitis   . Gastritis   . Hyperlipemia   . Colitis   . GERD (gastroesophageal reflux disease)   . Osteoarthritis   . Hemorrhoid   . 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 Kidney cysts     Left  . Kidney stones   . 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 Breast cancer 2009     2009 breast cancer stage IIA T2 N0, ER positive, HER-2 negative, 2.5 cm primary tumor, poorly differentiated, status post wide local excision, one sentinel lymph node negative, high-risk Oncotype score 48  . Lipoma of shoulder 2009    left  . Hemorrhoids   . Arthritis   . Osteopenia   . Cataracts, both eyes   . Detached vitreous humor   . Coronary artery disease   . Hypertension     Past Surgical History  Procedure Laterality Date  . Benign breast biopsy  2007  . Partial hysterectomy    . Upper gastrointestinal endoscopy    . Colonoscopy    . Abdominal hysterectomy    . Breast surgery    . Eye surgery    . Coronary angioplasty      Family History  Problem Relation Age of Onset  . Heart disease Mother   . Diabetes Father   . Heart disease Father   . Diabetes Sister   . Diabetes Brother   . Cervical cancer Cousin   . Lung  cancer Paternal Aunt   . Prostate cancer Maternal Uncle   . Leukemia    . Hypertension      Social History:  reports that she quit smoking about 21 years ago. Her smoking use included Cigarettes. She has never used smokeless tobacco. She reports that she does not drink alcohol or use illicit drugs.  Allergies:  Allergies  Allergen Reactions  . Alendronate Sodium     Other reaction(s): Unknown  . Atorvastatin     Other reaction(s): Muscle Pain  . Azithromycin Hives  . Ciprofloxacin     Other reaction(s): Headache  . Colestipol     Other reaction(s): Unknown  . Lidocaine Hives  . Lovastatin     Other reaction(s): Headache  . Metronidazole     Other reaction(s): Headache  . Niacin     Other reaction(s): Unknown  . Risedronate Sodium Other (See Comments)  . Rosuvastatin     Other reaction(s): Unknown  . Simvastatin     Other reaction(s): Headache    Medications: I have reviewed the patient's current medications.  Results for orders placed or performed during the hospital encounter of 05/14/15 (from the past 48 hour(s))  Basic metabolic panel     Status: None   Collection Time: 05/14/15  4:15 AM  Result Value Ref Range  Sodium 141 135 - 145 mmol/L   Potassium 4.1 3.5 - 5.1 mmol/L   Chloride 106 101 - 111 mmol/L   CO2 29 22 - 32 mmol/L   Glucose, Bld 99 65 - 99 mg/dL   BUN 18 6 - 20 mg/dL   Creatinine, Ser 0.77 0.44 - 1.00 mg/dL   Calcium 9.7 8.9 - 10.3 mg/dL   GFR calc non Af Amer >60 >60 mL/min   GFR calc Af Amer >60 >60 mL/min    Comment: (NOTE) The eGFR has been calculated using the CKD EPI equation. This calculation has not been validated in all clinical situations. eGFR's persistently <60 mL/min signify possible Chronic Kidney Disease.    Anion gap 6 5 - 15  CBC     Status: Abnormal   Collection Time: 05/14/15  4:15 AM  Result Value Ref Range   WBC 7.3 3.6 - 11.0 K/uL   RBC 5.49 (H) 3.80 - 5.20 MIL/uL   Hemoglobin 14.1 12.0 - 16.0 g/dL   HCT 44.2 35.0 -  47.0 %   MCV 80.5 80.0 - 100.0 fL   MCH 25.7 (L) 26.0 - 34.0 pg   MCHC 31.9 (L) 32.0 - 36.0 g/dL   RDW 15.2 (H) 11.5 - 14.5 %   Platelets 251 150 - 440 K/uL  Troponin I     Status: Abnormal   Collection Time: 05/14/15  4:15 AM  Result Value Ref Range   Troponin I 0.07 (H) <0.031 ng/mL    Comment: READ BACK AND VERIFIED Kathleen Walls 05/14/2015 0447 [facility]        PERSISTENTLY INCREASED TROPONIN VALUES IN THE RANGE OF 0.04-0.49 ng/mL CAN BE SEEN IN:       -UNSTABLE ANGINA       -CONGESTIVE HEART FAILURE       -MYOCARDITIS       -CHEST TRAUMA       -ARRYHTHMIAS       -LATE PRESENTING MYOCARDIAL INFARCTION       -COPD   CLINICAL FOLLOW-UP RECOMMENDED.   Fibrin derivatives D-Dimer ([facility] only)     Status: None   Collection Time: 05/14/15  4:15 AM  Result Value Ref Range   Fibrin derivatives D-dimer ([facility]) 349 0 - 499    Comment: <> Exclusion of Venous Thromboembolism (VTE) - OUTPATIENTS ONLY        (Emergency Department or [place])             0-499 ng/ml (FEU)  : With a low to intermediate pretest                                        probability for VTE this test result                                        excludes the diagnosis of VTE.           > 499 ng/ml (FEU)  : VTE not excluded.  Additional work up                                   for VTE is required.   <>  Testing on Inpatients and Evaluation of Disseminated Intravascular  Coagulation (DIC)             Reference Range:   0-499 ng/ml (FEU)   Troponin I     Status: Abnormal   Collection Time: 05/14/15  7:07 AM  Result Value Ref Range   Troponin I 0.06 (H) <0.031 ng/mL    Comment: RESULTS PREVIOUSLY CALLED TO Kathleen Walls AT 0447 ON 05/14/15 BY [facility]...[place]        PERSISTENTLY INCREASED TROPONIN VALUES IN THE RANGE OF 0.04-0.49 ng/mL CAN BE SEEN IN:       -UNSTABLE ANGINA       -CONGESTIVE HEART FAILURE       -MYOCARDITIS       -CHEST TRAUMA       -ARRYHTHMIAS       -LATE PRESENTING MYOCARDIAL INFARCTION        -COPD   CLINICAL FOLLOW-UP RECOMMENDED.     Dg Chest Port 1 View  05/14/2015   CLINICAL DATA:  Chest pain  EXAM: PORTABLE CHEST 1 VIEW  COMPARISON:  07/21/2014 CT  FINDINGS: Normal heart size and aortic contour. Mass at the right cardiophrenic sulcus is an incidental pericardial cyst based on 2015 CT. Generous lung volumes with no emphysema on previous CT. There is no edema, consolidation, effusion, or pneumothorax.  There are 2 rounded densities overlapping the proximal right humerus which are not seen on 2011 chest x-ray. These are favored artifactual but indeterminate.  IMPRESSION: 1. No evidence of acute cardiopulmonary disease. 2. 2 densities over the right humerus are favored artifactual, but dedicated humerus radiography is recommended to exclude new sclerotic lesions.   Electronically Signed   By: Monte Fantasia M.D.   On: 05/14/2015 04:42    Review of Systems  HENT: Positive for congestion.   Eyes: Negative.   Respiratory: Positive for shortness of breath.   Cardiovascular: Positive for chest pain, orthopnea and leg swelling.  Gastrointestinal: Positive for heartburn.  Genitourinary: Negative.   Musculoskeletal: Negative.   Skin: Negative.   Neurological: Positive for weakness.  Endo/Heme/Allergies: Negative.   Psychiatric/Behavioral: Negative.    Blood pressure 130/64, pulse 63, temperature 98.5 F (36.9 C), temperature source Oral, resp. rate 18, height _0  (1.6 m), weight 41.776 kg (92 lb 1.6 oz), SpO2 93 %. Physical Exam  Constitutional: She is oriented to person, place, and time. She appears well-developed and well-nourished.  HENT:  Head: Normocephalic and atraumatic.  Eyes: Conjunctivae and EOM are normal. Pupils are equal, round, and reactive to light.  Neck: Normal range of motion. Neck supple.  Cardiovascular: Normal rate and regular rhythm.   Murmur heard. Respiratory: Effort normal and breath sounds normal.  GI: Soft. Bowel sounds are normal.   Musculoskeletal: Normal range of motion.  Neurological: She is alert and oriented to person, place, and time. She has normal reflexes.  Skin: Skin is warm and dry.  Psychiatric: She has a normal mood and affect.    Assessment/Plan:  angina  GERD  hyperlipidemia  coronary artery disease  history of PCI and stent  history of breast cancer  anxiety  borderline troponin  hypertension  neuropathy . PLAN  agree with admit for rule out for myocardial infarction  follow-up borderline troponins  continue medical therapy including nitroglycerin for symptoms beta-blockers aspirin  continue Zetia for lipid management  Protonix for reflux symptoms  hypertension continue metoprolol  agree with gabapentin for neuropathy  history of breast cancer appears to be reasonably stable  agree with echocardiogram for assessment of LV function  [place] scan Myoview for evaluation of ischemia.  CALLWOOD,DWAYNE D. 05/14/2015, 1:28 PM

## 2015-05-14 NOTE — Progress Notes (Signed)
*  PRELIMINARY RESULTS* Echocardiogram 2D Echocardiogram has been performed.  Laqueta Jean Hege 05/14/2015, 8:47 AM

## 2015-05-14 NOTE — ED Notes (Signed)
Pt in with co chest pain tonight that woke her up

## 2015-05-17 NOTE — Discharge Summary (Signed)
Kathleen Walls, is a 74 y.o. female  DOB [birth year]  MRN [id number].  Admission date:  05/14/2015  Admitting Physician  Juluis Mire, MD  Discharge Date:  05/17/2015   Primary MD  Elisheba Mcdonnell D, MD  Recommendations for primary care physician for things to follow:     Admission Diagnosis  NSTEMI (non-ST elevated myocardial infarction) [facility]) [I21.4]   Discharge Diagnosis  Non-cardiac CP Principal Problem:   Chest pain Active Problems:   Benign hypertension   Arteriosclerosis of coronary artery   HLD (hyperlipidemia)      Past Medical History  Diagnosis Date  . Migraine   . Diverticulitis   . Gastritis   . Hyperlipemia   . Colitis   . GERD (gastroesophageal reflux disease)   . Osteoarthritis   . Hemorrhoid   . 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 Kidney cysts     Left  . Kidney stones   . 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 Breast cancer 2009     2009 breast cancer stage IIA T2 N0, ER positive, HER-2 negative, 2.5 cm primary tumor, poorly differentiated, status post wide local excision, one sentinel lymph node negative, high-risk Oncotype score 48  . Lipoma of shoulder 2009    left  . Hemorrhoids   . Arthritis   . Osteopenia   . Cataracts, both eyes   . Detached vitreous humor   . Coronary artery disease   . Hypertension     Past Surgical History  Procedure Laterality Date  . Benign breast biopsy  2007  . Partial hysterectomy    . Upper gastrointestinal endoscopy    . Colonoscopy    . Abdominal hysterectomy    . Breast surgery    . Eye surgery    . Coronary angioplasty         History of present illness and  Hospital Course:     Kindly see H&P for history of present illness and admission details, please review complete Labs, Consult reports and Test reports for all details in  brief  HPI  from the history and physical done on the day of admission    Hospital Course    Pt admitted with CP. Troponin slightly elevated. Felt to be from demand ischemia, not NSTEMI. Myoview negative. Cleared by Cardiology.  Discharge Condition: stable   Follow UP  Follow-up Information    Follow up with Vanetta Rule D, MD In 1 week.   Specialty:  Internal Medicine   Contact information:   [place] [address] [phone]         Discharge Instructions  and  Discharge Medications     Discharge Instructions    Diet - low sodium heart healthy    Complete by:  As directed      Increase activity slowly    Complete by:  As directed  Medication List    TAKE these medications        ALPRAZolam 0.25 MG tablet  Commonly known as:  XANAX  TAKE ONE-HALF TABLET BY MOUTH AT BEDTIME     aspirin EC 81 MG tablet  Take by mouth.     Azelaic Acid 15 % cream  Apply topically.     butalbital-aspirin-caffeine 50-325-40 MG capsule  Commonly known as:  FIORINAL  Take by mouth.     calcium carbonate 500 MG chewable tablet  Commonly known as:  TUMS - dosed in mg elemental calcium  Chew by mouth.     gabapentin 300 MG capsule  Commonly known as:  NEURONTIN  TAKE ONE CAPSULE BY MOUTH THREE TIMES DAILY     MULTI-VITAMINS Tabs  Take by mouth.     omeprazole 20 MG capsule  Commonly known as:  PRILOSEC  Take 20 mg by mouth 2 (two) times daily before a meal.     STOOL SOFTENER 100 MG capsule  Generic drug:  docusate sodium  Take by mouth.     VITAMIN D-1000 MAX ST 1000 UNITS tablet  Generic drug:  Cholecalciferol  Take by mouth.     ZETIA 10 MG tablet  Generic drug:  ezetimibe  Take by mouth.          Diet and Activity recommendation: See Discharge Instructions above   Consults obtained - Cardiology   Major procedures and Radiology Reports - PLEASE review detailed and final reports for all details, in brief -       Nm Myocar Multi W/spect W/wall Motion / Ef  05/14/2015    There was no ST segment deviation noted during stress.  Blood pressure demonstrated a blunted response to exercise.  No evidence of stress-induced myocardial ischemia  Ejection fraction greater than 75%  Borderline apical defect with evidence of coronary ischemia  This is a low risk study.  The left ventricular ejection fraction is hyperdynamic (>65%).  Nuclear stress EF: 75%.   No evidence of stress-induced myocardial ischemia with this Lexiscan  consider further evaluation possibly cardiac catheter of the patient's  symptoms persist or worsen Have the patient follow-up with primary cardiologist   Dg Chest Port 1 View  05/14/2015   CLINICAL DATA:  Chest pain  EXAM: PORTABLE CHEST 1 VIEW  COMPARISON:  07/21/2014 CT  FINDINGS: Normal heart size and aortic contour. Mass at the right cardiophrenic sulcus is an incidental pericardial cyst based on 2015 CT. Generous lung volumes with no emphysema on previous CT. There is no edema, consolidation, effusion, or pneumothorax.  There are 2 rounded densities overlapping the proximal right humerus which are not seen on 2011 chest x-ray. These are favored artifactual but indeterminate.  IMPRESSION: 1. No evidence of acute cardiopulmonary disease. 2. 2 densities over the right humerus are favored artifactual, but dedicated humerus radiography is recommended to exclude new sclerotic lesions.   Electronically Signed   By: Monte Fantasia M.D.   On: 05/14/2015 04:42    Micro Results     No results found for this or any previous visit (from the past 240 hour(s)).     Today   Subjective:   Kathleen Walls today has no headache,no chest abdominal pain,no new weakness tingling or numbness, feels much better wants to go home today.   Objective:   Blood pressure 134/63, pulse 78, temperature 98.5 F (36.9 C), temperature source Oral, resp. rate 18, height $RemoveBe'5\' 3"'adcNYKCGW$  (1.6 m), weight 41.776 kg (92  lb 1.6  oz), SpO2 96 %.  No intake or output data in the 24 hours ending 05/17/15 1257  Exam Awake Alert, Oriented x 3, No new F.N deficits, Normal affect [place].AT,PERRAL Supple Neck,No JVD, No cervical lymphadenopathy appriciated.  Symmetrical Chest wall movement, Good air movement bilaterally, CTAB RRR,No Gallops,Rubs or new Murmurs, No Parasternal Heave +ve B.Sounds, Abd Soft, Non tender, No organomegaly appriciated, No rebound -guarding or rigidity. No Cyanosis, Clubbing or edema, No new Rash or bruise  Data Review   CBC w Diff:  Lab Results  Component Value Date   WBC 7.3 05/14/2015   HGB 14.1 05/14/2015   HCT 44.2 05/14/2015   PLT 251 05/14/2015    CMP:  Lab Results  Component Value Date   NA 141 05/14/2015   NA 143 12/31/2013   K 4.1 05/14/2015   K 4.5 12/31/2013   CL 106 05/14/2015   CL 111* 12/31/2013   CO2 29 05/14/2015   CO2 28 12/31/2013   BUN 18 05/14/2015   BUN 10 12/31/2013   CREATININE 0.77 05/14/2015   CREATININE 0.87 12/31/2013  .   Total Time in preparing paper work, data evaluation and todays exam - 35 minutes  Erendira Crabtree D M.D on 05/17/2015 at 12:57 PM

## 2015-05-20 ENCOUNTER — Encounter: Payer: Self-pay | Admitting: Internal Medicine

## 2015-05-20 ENCOUNTER — Inpatient Hospital Stay: Payer: Medicare Other | Attending: Internal Medicine | Admitting: Internal Medicine

## 2015-05-20 VITALS — BP 139/72 | HR 64 | Temp 97.0°F | Resp 16 | Wt 93.7 lb

## 2015-05-20 DIAGNOSIS — Z87891 Personal history of nicotine dependence: Secondary | ICD-10-CM | POA: Diagnosis not present

## 2015-05-20 DIAGNOSIS — E785 Hyperlipidemia, unspecified: Secondary | ICD-10-CM | POA: Diagnosis not present

## 2015-05-20 DIAGNOSIS — I2510 Atherosclerotic heart disease of native coronary artery without angina pectoris: Secondary | ICD-10-CM | POA: Insufficient documentation

## 2015-05-20 DIAGNOSIS — Z853 Personal history of malignant neoplasm of breast: Secondary | ICD-10-CM | POA: Insufficient documentation

## 2015-05-20 DIAGNOSIS — M8580 Other specified disorders of bone density and structure, unspecified site: Secondary | ICD-10-CM | POA: Insufficient documentation

## 2015-05-20 DIAGNOSIS — Z170 Estrogen receptor positive status [ER+]: Secondary | ICD-10-CM | POA: Diagnosis not present

## 2015-05-20 DIAGNOSIS — I10 Essential (primary) hypertension: Secondary | ICD-10-CM | POA: Diagnosis not present

## 2015-05-20 DIAGNOSIS — Z79899 Other long term (current) drug therapy: Secondary | ICD-10-CM | POA: Insufficient documentation

## 2015-05-20 DIAGNOSIS — K219 Gastro-esophageal reflux disease without esophagitis: Secondary | ICD-10-CM | POA: Insufficient documentation

## 2015-05-20 DIAGNOSIS — C50412 Malignant neoplasm of upper-outer quadrant of left female breast: Secondary | ICD-10-CM

## 2015-05-20 NOTE — Progress Notes (Signed)
[place] OFFICE PROGRESS NOTE  Patient Care Team: Idelle Crouch, MD as PCP - General (Internal Medicine)   SUMMARY OF ONCOLOGIC HISTORY:  2009- LEFT BREAST CA STAGE II A [T2-2.5CM; N0] ER/PR POS; Her- 2 NEU NEG ONCOTYPE- 48 [high risk];s/p Lumepc & SLNBx-NEG; s/p RT; s/p chemo;  AI x 3 years [stopped in 2012- intolerance]  INTERVAL HISTORY:  This is my first interaction with the patient since I joined the practice September 2016. I reviewed the patient's prior chart/pertinent labs/imaging in detail; findings are summarized.  A very pleasant 74 year old female patient with above history of stage II breast cancer is here for follow-up. Patient is not currently on any adjuvant antihormone therapy.  Patient had a recent visit to the emergency room for chest pain; no cardiac etiology was found.  Patient denies any unusual shortness of breath or cough or unusual bone pain. Her appetite is fair. No unusual weight loss.  REVIEW OF SYSTEMS:  A complete 10 point review of system is done which is negative except mentioned above/history of present illness.   PAST MEDICAL HISTORY :  Past Medical History  Diagnosis Date  . Migraine   . Diverticulitis   . Gastritis   . Hyperlipemia   . Colitis   . GERD (gastroesophageal reflux disease)   . Osteoarthritis   . Hemorrhoid   . 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 Kidney cysts     Left  . Kidney stones   . 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 Breast cancer [facility]) 2009     2009 breast cancer stage IIA T2 N0, ER positive, HER-2 negative, 2.5 cm primary tumor, poorly differentiated, status post wide local excision, one sentinel lymph node negative, high-risk Oncotype score 48  . Lipoma of shoulder 2009    left  . Hemorrhoids   . Arthritis   . Osteopenia   . Cataracts, both eyes   . Detached vitreous humor   . Coronary artery disease   .  Hypertension   . Stented coronary artery     PAST SURGICAL HISTORY :   Past Surgical History  Procedure Laterality Date  . Benign breast biopsy  2007  . Partial hysterectomy    . Upper gastrointestinal endoscopy    . Colonoscopy    . Abdominal hysterectomy    . Breast surgery    . Eye surgery    . Coronary angioplasty      FAMILY HISTORY :   Family History  Problem Relation Age of Onset  . Heart disease Mother   . Diabetes Father   . Heart disease Father   . Diabetes Sister   . Diabetes Brother   . Cervical cancer Cousin   . Lung cancer Paternal Aunt   . Prostate cancer Maternal Uncle   . Leukemia    . Hypertension      SOCIAL HISTORY:   Social History  Substance Use Topics  . Smoking status: Former Smoker    Types: Cigarettes    Quit date: 08/14/1993  . Smokeless tobacco: Never Used  . Alcohol Use: No    ALLERGIES:  is allergic to alendronate sodium; atorvastatin; azithromycin; ciprofloxacin; colestipol; lidocaine; lovastatin; metronidazole; niacin; risedronate sodium; rosuvastatin; and simvastatin.  MEDICATIONS:  Current Outpatient Prescriptions  Medication Sig Dispense Refill  . ALPRAZolam (XANAX) 0.25 MG tablet TAKE ONE-HALF TABLET BY MOUTH AT  BEDTIME    . aspirin EC 81 MG tablet Take by mouth.    . Azelaic Acid 15 % cream Apply topically.    . butalbital-aspirin-caffeine (FIORINAL) 50-325-40 MG per capsule Take by mouth.    . calcium carbonate (TUMS - DOSED IN MG ELEMENTAL CALCIUM) 500 MG chewable tablet Chew by mouth.    . Cholecalciferol (VITAMIN D-1000 MAX ST) 1000 UNITS tablet Take by mouth.    . docusate sodium (STOOL SOFTENER) 100 MG capsule Take by mouth.    . ezetimibe (ZETIA) 10 MG tablet Take by mouth.    . gabapentin (NEURONTIN) 300 MG capsule TAKE ONE CAPSULE BY MOUTH THREE TIMES DAILY    . Multiple Vitamin (MULTI-VITAMINS) TABS Take by mouth.    Marland Kitchen omeprazole (PRILOSEC) 20 MG capsule Take 20 mg by mouth 2 (two) times daily before a meal.      No current facility-administered medications for this visit.    PHYSICAL EXAMINATION: ECOG PERFORMANCE STATUS: 0 - Asymptomatic  BP 139/72 mmHg  Pulse 64  Temp(Src) 97 F (36.1 C) (Tympanic)  Resp 16  Wt 93 lb 11.1 oz (42.5 kg)  Filed Weights   05/20/15 1046  Weight: 93 lb 11.1 oz (42.5 kg)    GENERAL: Well-nourished well-developed; Alert, no distress and comfortable.  Thin built. She is accompanied by her husband. EYES: no pallor or icterus OROPHARYNX: no thrush or ulceration; good dentition  NECK: supple, no masses felt LYMPH:  no palpable lymphadenopathy in the cervical, axillary or inguinal regions LUNGS: clear to auscultation and  No wheeze or crackles HEART/CVS: regular rate & rhythm and no murmurs; No lower extremity edema ABDOMEN:abdomen soft, non-tender and normal bowel sounds Musculoskeletal:no cyanosis of digits and no clubbing  PSYCH: alert & oriented x 3 with fluent speech NEURO: no focal motor/sensory deficits SKIN:  no rashes or significant lesions Breast exam [in presence of husband]- right breast- no skin changes no nipple changes no masses felt. Left breast- lumpectomy scar noted otherwise no masses felt.  LABORATORY DATA:  I have reviewed the data as listed    Component Value Date/Time   NA 141 05/14/2015 0415   NA 143 12/31/2013 0406   K 4.1 05/14/2015 0415   K 4.5 12/31/2013 0406   CL 106 05/14/2015 0415   CL 111* 12/31/2013 0406   CO2 29 05/14/2015 0415   CO2 28 12/31/2013 0406   GLUCOSE 99 05/14/2015 0415   GLUCOSE 115* 12/31/2013 0406   BUN 18 05/14/2015 0415   BUN 10 12/31/2013 0406   CREATININE 0.77 05/14/2015 0415   CREATININE 0.87 12/31/2013 0406   CALCIUM 9.7 05/14/2015 0415   CALCIUM 8.6 12/31/2013 0406   GFRNONAA >60 05/14/2015 0415   GFRNONAA >60 12/31/2013 0406   GFRAA >60 05/14/2015 0415   GFRAA >60 12/31/2013 0406    No results found for: SPEP, UPEP  Lab Results  Component Value Date   WBC 7.3 05/14/2015   HGB 14.1  05/14/2015   HCT 44.2 05/14/2015   MCV 80.5 05/14/2015   PLT 251 05/14/2015      Chemistry      Component Value Date/Time   NA 141 05/14/2015 0415   NA 143 12/31/2013 0406   K 4.1 05/14/2015 0415   K 4.5 12/31/2013 0406   CL 106 05/14/2015 0415   CL 111* 12/31/2013 0406   CO2 29 05/14/2015 0415   CO2 28 12/31/2013 0406   BUN 18 05/14/2015 0415   BUN 10 12/31/2013 0406   CREATININE  0.77 05/14/2015 0415   CREATININE 0.87 12/31/2013 0406      Component Value Date/Time   CALCIUM 9.7 05/14/2015 0415   CALCIUM 8.6 12/31/2013 0406       RADIOGRAPHIC STUDIES: I have personally reviewed the radiological images as listed and agreed with the findings in the report. No results found.   ASSESSMENT & PLAN:   # Breast cancer- stage II ER/PR positive HER-2/neu negative high risk Oncotype status post chemotherapy [2009]; adjuvant hormone therapy until 2012. Clinically, based on history/review of system and physical findings; no evidence of any recurrence noted. Given the poor tolerance to antihormone therapy, patient currently is not on any antihormone therapy.   # I reviewed the most recent mammogram December 2015 no evidence of any recurrence. Patient will keep up with a mammogram again December 2016.   Follow-up in 6 months.  No orders of the defined types were placed in this encounter.   All questions were answered. The patient knows to call the clinic with any problems, questions or concerns. No barriers to learning was detected. I spent 15 minutes counseling the patient face to face. The total time spent in the appointment was 30 minutes and more than 50% was on counseling and review of test results     Cammie Sickle, MD 05/20/2015 11:09 AM

## 2015-06-22 ENCOUNTER — Encounter: Payer: Self-pay | Admitting: Certified Registered Nurse Anesthetist

## 2015-06-22 ENCOUNTER — Encounter
Admission: RE | Disposition: A | Discharge status: Home / Self Care | Payer: Self-pay | Source: Ambulatory Visit | Attending: Cardiology

## 2015-06-22 ENCOUNTER — Ambulatory Visit
Admission: RE | Admit: 2015-06-22 | Discharge: 2015-06-22 | Disposition: A | Discharge status: Home / Self Care | Payer: Medicare Other | Source: Ambulatory Visit | Attending: Cardiology | Admitting: Cardiology

## 2015-06-22 ENCOUNTER — Encounter: Payer: Self-pay | Admitting: *Deleted

## 2015-06-22 DIAGNOSIS — I2510 Atherosclerotic heart disease of native coronary artery without angina pectoris: Secondary | ICD-10-CM | POA: Diagnosis not present

## 2015-06-22 DIAGNOSIS — Z87891 Personal history of nicotine dependence: Secondary | ICD-10-CM | POA: Insufficient documentation

## 2015-06-22 DIAGNOSIS — I119 Hypertensive heart disease without heart failure: Secondary | ICD-10-CM | POA: Diagnosis not present

## 2015-06-22 DIAGNOSIS — E785 Hyperlipidemia, unspecified: Secondary | ICD-10-CM | POA: Insufficient documentation

## 2015-06-22 DIAGNOSIS — Z79899 Other long term (current) drug therapy: Secondary | ICD-10-CM | POA: Diagnosis not present

## 2015-06-22 DIAGNOSIS — Z955 Presence of coronary angioplasty implant and graft: Secondary | ICD-10-CM | POA: Diagnosis not present

## 2015-06-22 DIAGNOSIS — Z9010 Acquired absence of unspecified breast and nipple: Secondary | ICD-10-CM | POA: Insufficient documentation

## 2015-06-22 DIAGNOSIS — Z9849 Cataract extraction status, unspecified eye: Secondary | ICD-10-CM | POA: Insufficient documentation

## 2015-06-22 DIAGNOSIS — K5730 Diverticulosis of large intestine without perforation or abscess without bleeding: Secondary | ICD-10-CM | POA: Diagnosis not present

## 2015-06-22 DIAGNOSIS — R079 Chest pain, unspecified: Secondary | ICD-10-CM | POA: Diagnosis present

## 2015-06-22 DIAGNOSIS — R0602 Shortness of breath: Secondary | ICD-10-CM | POA: Insufficient documentation

## 2015-06-22 DIAGNOSIS — M810 Age-related osteoporosis without current pathological fracture: Secondary | ICD-10-CM | POA: Diagnosis not present

## 2015-06-22 DIAGNOSIS — Z90710 Acquired absence of both cervix and uterus: Secondary | ICD-10-CM | POA: Diagnosis not present

## 2015-06-22 DIAGNOSIS — I259 Chronic ischemic heart disease, unspecified: Secondary | ICD-10-CM | POA: Diagnosis not present

## 2015-06-22 DIAGNOSIS — E010 Iodine-deficiency related diffuse (endemic) goiter: Secondary | ICD-10-CM | POA: Diagnosis not present

## 2015-06-22 DIAGNOSIS — K219 Gastro-esophageal reflux disease without esophagitis: Secondary | ICD-10-CM | POA: Diagnosis not present

## 2015-06-22 DIAGNOSIS — F419 Anxiety disorder, unspecified: Secondary | ICD-10-CM | POA: Diagnosis not present

## 2015-06-22 DIAGNOSIS — G629 Polyneuropathy, unspecified: Secondary | ICD-10-CM | POA: Insufficient documentation

## 2015-06-22 DIAGNOSIS — Z7982 Long term (current) use of aspirin: Secondary | ICD-10-CM | POA: Insufficient documentation

## 2015-06-22 DIAGNOSIS — K210 Gastro-esophageal reflux disease with esophagitis: Secondary | ICD-10-CM | POA: Diagnosis not present

## 2015-06-22 DIAGNOSIS — I739 Peripheral vascular disease, unspecified: Secondary | ICD-10-CM | POA: Insufficient documentation

## 2015-06-22 DIAGNOSIS — Z833 Family history of diabetes mellitus: Secondary | ICD-10-CM | POA: Insufficient documentation

## 2015-06-22 DIAGNOSIS — Z8249 Family history of ischemic heart disease and other diseases of the circulatory system: Secondary | ICD-10-CM | POA: Insufficient documentation

## 2015-06-22 DIAGNOSIS — Z9889 Other specified postprocedural states: Secondary | ICD-10-CM | POA: Insufficient documentation

## 2015-06-22 DIAGNOSIS — Z853 Personal history of malignant neoplasm of breast: Secondary | ICD-10-CM | POA: Insufficient documentation

## 2015-06-22 HISTORY — PX: CARDIAC CATHETERIZATION: SHX172

## 2015-06-22 SURGERY — LEFT HEART CATH
Anesthesia: Moderate Sedation

## 2015-06-22 MED ORDER — SODIUM CHLORIDE 0.9 % IJ SOLN: 3.0000 mL | INTRAMUSCULAR | Status: DC | PRN

## 2015-06-22 MED ORDER — MIDAZOLAM HCL 2 MG/2ML IJ SOLN
INTRAMUSCULAR | Status: AC
Start: 2015-06-22 — End: 2015-06-22
  Filled 2015-06-22: qty 2

## 2015-06-22 MED ORDER — IOHEXOL 300 MG/ML  SOLN
INTRAMUSCULAR | Status: DC | PRN
  Administered 2015-06-22: 80 mL via INTRA_ARTERIAL
  Administered 2015-06-22: 30 mL via INTRA_ARTERIAL

## 2015-06-22 MED ORDER — SODIUM CHLORIDE 0.9 % IV SOLN
INTRAVENOUS | Status: DC
  Administered 2015-06-22: 07:00:00 via INTRAVENOUS

## 2015-06-22 MED ORDER — SODIUM CHLORIDE 0.9 % IJ SOLN
10.0000 mL | Freq: Once | INTRAMUSCULAR | Status: AC
  Administered 2015-06-22: 10 mL via INTRAVENOUS

## 2015-06-22 MED ORDER — BUTALBITAL-APAP-CAFFEINE 50-325-40 MG PO TABS
1.0000 | ORAL_TABLET | Freq: Once | ORAL | Status: DC
  Filled 2015-06-22: qty 1

## 2015-06-22 MED ORDER — HEPARIN (PORCINE) IN NACL 2-0.9 UNIT/ML-% IJ SOLN
INTRAMUSCULAR | Status: AC
  Filled 2015-06-22: qty 1000

## 2015-06-22 MED ORDER — SODIUM CHLORIDE 0.9 % IJ SOLN: 3.0000 mL | Freq: Two times a day (BID) | INTRAMUSCULAR | Status: DC

## 2015-06-22 MED ORDER — SODIUM CHLORIDE 0.9 % WEIGHT BASED INFUSION: 3.0000 mL/kg/h | INTRAVENOUS | Status: DC

## 2015-06-22 MED ORDER — SODIUM CHLORIDE 0.9 % IV SOLN: 250.0000 mL | INTRAVENOUS | Status: DC | PRN

## 2015-06-22 MED ORDER — MIDAZOLAM HCL 2 MG/2ML IJ SOLN
INTRAMUSCULAR | Status: DC | PRN
  Administered 2015-06-22: 0.5 mg via INTRAVENOUS

## 2015-06-22 MED ORDER — FENTANYL CITRATE (PF) 100 MCG/2ML IJ SOLN
INTRAMUSCULAR | Status: AC
  Filled 2015-06-22: qty 2

## 2015-06-22 SURGICAL SUPPLY — 9 items
CATH INFINITI 5FR ANG PIGTAIL (CATHETERS) ×3 IMPLANT
CATH INFINITI 5FR JL4 (CATHETERS) ×3 IMPLANT
CATH INFINITI JR4 5F (CATHETERS) ×3 IMPLANT
DEVICE CLOSURE MYNXGRIP 5F (Vascular Products) ×3 IMPLANT
KIT MANI 3VAL PERCEP (MISCELLANEOUS) ×3 IMPLANT
NEEDLE PERC 18GX7CM (NEEDLE) ×3 IMPLANT
PACK CARDIAC CATH (CUSTOM PROCEDURE TRAY) ×3 IMPLANT
SHEATH AVANTI 5FR X 11CM (SHEATH) ×3 IMPLANT
WIRE EMERALD 3MM-J .035X150CM (WIRE) ×3 IMPLANT

## 2015-06-22 NOTE — OR Nursing (Signed)
Pt complaining of right behind eye flashing, resembles migraine, hasn't had migraine in years. Dr Ubaldo Glassing notified, fiorinal ordered. encouraged to eat waiting for med from pharmacy.

## 2015-06-22 NOTE — OR Nursing (Signed)
Pt reports flashing gone after ate. Declined fioicet. Ambulated to bathroom with assistance. Voided without difficulty.

## 2015-06-22 NOTE — Discharge Instructions (Signed)
Angiogram, Care After °Refer to this sheet in the next few weeks. These instructions provide you with information about caring for yourself after your procedure. Your health care provider may also give you more specific instructions. Your treatment has been planned according to current medical practices, but problems sometimes occur. Call your health care provider if you have any problems or questions after your procedure. °WHAT TO EXPECT AFTER THE PROCEDURE °After your procedure, it is typical to have the following: °· Bruising at the catheter insertion site that usually fades within 1-2 weeks. °· Blood collecting in the tissue (hematoma) that may be painful to the touch. It should usually decrease in size and tenderness within 1-2 weeks. °HOME CARE INSTRUCTIONS °· Take medicines only as directed by your health care provider. °· You may shower 24-48 hours after the procedure or as directed by your health care provider. Remove the bandage (dressing) and gently wash the site with plain soap and water. Pat the area dry with a clean towel. Do not rub the site, because this may cause bleeding. °· Do not take baths, swim, or use a hot tub until your health care provider approves. °· Check your insertion site every day for redness, swelling, or drainage. °· Do not apply powder or lotion to the site. °· Do not lift over 10 lb (4.5 kg) for 5 days after your procedure or as directed by your health care provider. °· Ask your health care provider when it is okay to: °¨ Return to work or school. °¨ Resume usual physical activities or sports. °¨ Resume sexual activity. °· Do not drive home if you are discharged the same day as the procedure. Have someone else drive you. °· You may drive 24 hours after the procedure unless otherwise instructed by your health care provider. °· Do not operate machinery or power tools for 24 hours after the procedure or as directed by your health care provider. °· If your procedure was done as an  outpatient procedure, which means that you went home the same day as your procedure, a responsible adult should be with you for the first 24 hours after you arrive home. °· Keep all follow-up visits as directed by your health care provider. This is important. °SEEK MEDICAL CARE IF: °· You have a fever. °· You have chills. °· You have increased bleeding from the catheter insertion site. Hold pressure on the site. °SEEK IMMEDIATE MEDICAL CARE IF: °· You have unusual pain at the catheter insertion site. °· You have redness, warmth, or swelling at the catheter insertion site. °· You have drainage (other than a small amount of blood on the dressing) from the catheter insertion site. °· The catheter insertion site is bleeding, and the bleeding does not stop after 30 minutes of holding steady pressure on the site. °· The area near or just beyond the catheter insertion site becomes pale, cool, tingly, or numb. °  °This information is not intended to replace advice given to you by your health care provider. Make sure you discuss any questions you have with your health care provider. °  °Document Released: 02/16/2005 Document Revised: 08/21/2014 Document Reviewed: 01/01/2013 °[employer]vier Interactive Patient Education ©2016 [employer] ° °

## 2015-06-22 NOTE — H&P (Signed)
Chief Complaint  Patient presents with  . Follow-up  4 weeks  . Shortness of Breath  had it 2x one pretty bad  Date of Service: 06/21/2015 Date of Birth: [birth year] PCP: Idelle Crouch, MD, MD  History of Present Illness: Kathleen Walls is a 74 y.o.female patient who returns for follow-up visit. Has a history of coronary disease status post PCI. She also has some peripheral vascular disease and is being followed by vascular surgery for this. She is currently on aspirin after being taken off of tachy had were lower. She has been evaluated for episodic shortness of breath episodes with a functional study an echocardiogram. These have not show significant change. She continues to have episodic symptoms. She is had several in the last several weeks will somewhat which were pretty bad. We had discussed previously S consideration the going the back to cardiac catheterization to further evaluate her coronary anatomy given her persistent episodic symptoms which were concerning for possible ischemia. The Past Medical and Surgical History  Past Medical History Past Medical History  Diagnosis Date  . Anxiety  . Breast cancer  Radiation therapy, chemotherapy  . Chronic gastritis  . Coronary artery disease  pci of rca with des  . Diverticulosis of sigmoid colon  per colonoscopy 2009  . Duodenitis  per EGD 2009  . Erosive gastritis  per EGD 2009  . GERD (gastroesophageal reflux disease)  . History of colitis  . History of hemorrhoids  . Hyperlipidemia  . Injury of left rotator cuff  . Internal hemorrhoids  per colonoscopy 2009  . Migraine headache  . Mitral regurgitation  per echocardiogram read by Dr. Rusty Aus 07/02/2013  . Neuropathy  . Osteoporosis  Atelvia  . Other esophagitis  LA grade A reflux esophagitis per EGD 2009  . Thyromegaly  History of   Past Surgical History She has a past surgical history that includes Hysterectomy Vaginal; SHOULDER LIPOMA REMOVAL (Left); BLADDER  TACK; Mastectomy partial / lumpectomy; Cataract extraction; Hysterectomy; stent placed Rt aortic artery; and Coronary angioplasty.   Medications and Allergies  Current Medications  Current Outpatient Prescriptions  Medication Sig Dispense Refill  . ALPRAZolam (XANAX) 0.25 MG tablet TAKE ONE-HALF TABLET BY MOUTH AT BEDTIME 45 tablet 0  . aspirin 81 MG EC tablet Take 81 mg by mouth once daily.  Marland Kitchen azelaic acid (FINACEA) 15 % topical gel Apply topically 2 (two) times daily. After skin is thoroughly washed and patted dry, gently but thoroughly massage a thin film of azelaic acid cream into the affected area twice daily, in the morning and evening.  . butalbital-aspirin-caffeine [facility]) 50-325-40 mg capsule Take 1 capsule by mouth every 4 (four) hours as needed for Headache.  . calcium carbonate 500 mg calcium (1,250 mg) chewable tablet Take 1,000 mg of elemental by mouth once daily.  . cholecalciferol (CHOLECALCIFEROL) 1,000 unit tablet Take 1,000 Units by mouth once daily.  Marland Kitchen docusate (COLACE) 100 MG capsule Take 100 mg by mouth once daily as needed for Constipation.  Marland Kitchen ezetimibe (ZETIA) 10 mg tablet Take 1 tablet (10 mg total) by mouth once daily. 90 tablet 2  . gabapentin (NEURONTIN) 300 MG capsule TAKE ONE CAPSULE BY MOUTH THREE TIMES DAILY 270 capsule 0  . metoprolol succinate (TOPROL-XL) 25 MG XL tablet Take 1 tablet (25 mg total) by mouth once daily. 30 tablet 1  . multivitamin tablet Take 1 tablet by mouth once daily.  . nitroGLYcerin (NITROSTAT) 0.4 MG SL tablet Place 1 tablet (0.4 mg  total) under the tongue every 5 (five) minutes as needed for Chest pain. May take up to 3 doses. 25 tablet 0  . omeprazole (PRILOSEC) 20 MG DR capsule TAKE ONE CAPSULE BY MOUTH TWICE DAILY 180 capsule 0   No current facility-administered medications for this visit.   Allergies: Atelvia [risedronate]; Azithromycin; Ciprofloxacin; Colestipol; Crestor [rosuvastatin]; Fosamax [alendronate]; Lidocaine; Lipitor  [atorvastatin]; Metronidazole; Mevacor [lovastatin]; Niacin; and Zocor [simvastatin]  Social and Family History  Social History reports that she has quit smoking. She does not have any smokeless tobacco history on file. She reports that she does not drink alcohol.  Family History Family History  Problem Relation Age of Onset  . Coronary artery disease  . Diabetes mellitus  . Aneurysm Mother  . Heart disease Mother  . Diabetes type II Father  . Heart disease Father  . Diabetes type II Sister  . Diabetes type II Brother   Review of Systems  Review of Systems  Constitutional: Negative for chills, diaphoresis, fever, malaise/fatigue and weight loss.  HENT: Negative for congestion, ear discharge, hearing loss and tinnitus.  Eyes: Negative for blurred vision.  Respiratory: Positive for shortness of breath. Negative for cough, hemoptysis, sputum production and wheezing.  Cardiovascular: Negative for palpitations, orthopnea, claudication, leg swelling and PND.  Gastrointestinal: Negative for abdominal pain, blood in stool, constipation, diarrhea, heartburn, melena, nausea and vomiting.  Genitourinary: Negative for dysuria, frequency, hematuria and urgency.  Musculoskeletal: Negative for back pain, falls, joint pain and myalgias.  Skin: Negative for itching and rash.  Neurological: Negative for dizziness, tingling, focal weakness, loss of consciousness, weakness and headaches.  Endo/Heme/Allergies: Negative for polydipsia. Does not bruise/bleed easily.  Psychiatric/Behavioral: Negative for depression, memory loss and substance abuse. The patient is nervous/anxious.    Physical Examination   Vitals: Visit Vitals  . BP 120/70 (BP Location: Left upper arm, Patient Position: Sitting, BP Cuff Size: Child)  . Pulse 72  . Resp 12  . Ht 160 cm (5\' 3" )  . Wt (!) 43.6 kg (96 lb 3.2 oz)  . LMP (LMP Unknown)  . BMI 17.04 kg/m2   Ht:160 cm (5\' 3" ) Wt:(!) 43.6 kg (96 lb 3.2 oz) HYI:FOYD  surface area is 1.39 meters squared. Body mass index is 17.04 kg/(m^2).  Wt Readings from Last 3 Encounters:  06/21/15 (!) 43.6 kg (96 lb 3.2 oz)  05/20/15 (!) (P) 43.1 kg (95 lb)  05/18/15 (!) 42.2 kg (93 lb)   BP Readings from Last 3 Encounters:  06/21/15 120/70  05/20/15 (P) 124/70  05/18/15 110/70  general: Caucasian female in no acute distress  LUNGS Breath Sounds: Normal Percussion: Normal  CARDIOVASCULAR JVP CV wave: no HJR: no Elevation at 90 degrees: None Carotid Pulse: normal pulsation bilaterally Bruit: None Apex: apical impulse normal  Auscultation Rhythm: normal sinus rhythm S1: normal S2: normal Clicks: no Rub: no Murmurs: no murmurs  Gallop: None ABDOMEN Liver enlargement: no Pulsatile aorta: no Ascites: no Bruits: no  EXTREMITIES Clubbing: no Edema: trace to 1+ bilateral pedal edema Pulses: peripheral pulses symmetrical Femoral Bruits: no Amputation: no SKIN Rash: no Cyanosis: no Embolic phemonenon: no Bruising: no NEURO Alert and Oriented to person, place and time: yes Non focal: yes LABS Last 3 CBC results: Lab Results  Component Value Date  WBC 7.3 06/21/2015  WBC 5.7 01/06/2015  WBC 8.0 08/31/2014   Lab Results  Component Value Date  HGB 13.3 06/21/2015  HGB 13.0 01/06/2015  HGB 13.4 08/31/2014   Lab Results  Component Value Date  HCT 43.2 06/21/2015  HCT 40.4 01/06/2015  HCT 41.8 08/31/2014   Lab Results  Component Value Date  PLT 253 06/21/2015  PLT 249 01/06/2015  PLT 271 08/31/2014   Lab Results  Component Value Date  CREATININE 0.8 01/06/2015  BUN 14 01/06/2015  NA 142 01/06/2015  K 4.1 01/06/2015  CL 106 01/06/2015  CO2 34.1 (H) 01/06/2015   Lab Results  Component Value Date  HDL 64.5 01/06/2015  HDL 66.5 08/31/2014  HDL 61.4 02/16/2014   Lab Results  Component Value Date  LDLCALC 177 (H) 01/06/2015  LDLCALC 190 (H) 08/31/2014  LDLCALC 173 (H) 02/16/2014   Lab Results  Component Value  Date  TRIG 86 01/06/2015  TRIG 136 08/31/2014  TRIG 170 02/16/2014   Lab Results  Component Value Date  ALT 15 01/06/2015  AST 19 01/06/2015  ALKPHOS 56 01/06/2015   Lab Results  Component Value Date  TSH 1.246 07/22/2014     Assessment and Plan   74 y.o. female with  ICD-10-CM ICD-9-CM  1. Benign hypertension-blood pressure currently controlled with current regimen. Will follow I10 401.1  2. Coronary artery disease involving native coronary artery of native heart without angina pectoris-status post PCI. Continues to have further symptoms. Functional study was unremarkable bladder symptoms persist limiting her activity. Risk and benefits of relook cardiac catheterization were explained to the patient and she agrees to proceed. Will proceed left cardiac catheterization to evaluate coronary anatomy. I25.10 414.01  3. Hyperlipidemia, unspecified hyperlipidemia type-continue with Zetia E78.5 272.4   Return in about 1 week (around 06/28/2015).  These notes generated with voice recognition software. I apologize for typographical errors.  Sydnee Levans, MD

## 2015-07-06 ENCOUNTER — Other Ambulatory Visit: Payer: Self-pay | Admitting: Internal Medicine

## 2015-07-06 DIAGNOSIS — R0789 Other chest pain: Secondary | ICD-10-CM

## 2015-07-07 ENCOUNTER — Other Ambulatory Visit: Payer: Self-pay | Admitting: Internal Medicine

## 2015-07-07 DIAGNOSIS — Z853 Personal history of malignant neoplasm of breast: Secondary | ICD-10-CM

## 2015-07-21 ENCOUNTER — Ambulatory Visit
Admission: RE | Admit: 2015-07-21 | Discharge: 2015-07-21 | Disposition: A | Discharge status: Home / Self Care | Payer: Medicare Other | Source: Ambulatory Visit | Attending: Internal Medicine | Admitting: Internal Medicine

## 2015-07-21 DIAGNOSIS — R0789 Other chest pain: Secondary | ICD-10-CM | POA: Diagnosis present

## 2015-07-21 DIAGNOSIS — R911 Solitary pulmonary nodule: Secondary | ICD-10-CM | POA: Diagnosis not present

## 2015-07-28 ENCOUNTER — Ambulatory Visit
Admission: RE | Admit: 2015-07-28 | Discharge: 2015-07-28 | Disposition: A | Discharge status: Home / Self Care | Payer: Medicare Other | Source: Ambulatory Visit | Attending: Internal Medicine | Admitting: Internal Medicine

## 2015-07-28 ENCOUNTER — Other Ambulatory Visit: Payer: Self-pay | Admitting: Internal Medicine

## 2015-07-28 DIAGNOSIS — Z853 Personal history of malignant neoplasm of breast: Secondary | ICD-10-CM

## 2015-09-29 ENCOUNTER — Encounter: Payer: Self-pay | Admitting: Obstetrics and Gynecology

## 2015-10-27 ENCOUNTER — Encounter: Payer: Self-pay | Admitting: Obstetrics and Gynecology

## 2015-10-27 ENCOUNTER — Ambulatory Visit (INDEPENDENT_AMBULATORY_CARE_PROVIDER_SITE_OTHER): Payer: Medicare Other | Admitting: Obstetrics and Gynecology

## 2015-10-27 VITALS — BP 107/62 | HR 67 | Ht 63.0 in | Wt 97.8 lb

## 2015-10-27 DIAGNOSIS — Z853 Personal history of malignant neoplasm of breast: Secondary | ICD-10-CM | POA: Diagnosis not present

## 2015-10-27 DIAGNOSIS — N8110 Cystocele, unspecified: Secondary | ICD-10-CM

## 2015-10-27 DIAGNOSIS — N816 Rectocele: Secondary | ICD-10-CM | POA: Diagnosis not present

## 2015-10-27 DIAGNOSIS — Z01419 Encounter for gynecological examination (general) (routine) without abnormal findings: Secondary | ICD-10-CM | POA: Diagnosis not present

## 2015-10-27 DIAGNOSIS — R636 Underweight: Secondary | ICD-10-CM | POA: Diagnosis not present

## 2015-10-27 DIAGNOSIS — N819 Female genital prolapse, unspecified: Secondary | ICD-10-CM

## 2015-10-27 NOTE — Progress Notes (Signed)
ANNUAL PREVENTATIVE CARE GYNECOLOGY  ENCOUNTER NOTE  Subjective:       Kathleen Walls is a 75 y.o. G50P2002 female here for a routine annual gynecologic exam. The patient is sexually active. The patient is not taking hormone replacement therapy. Patient denies post-menopausal vaginal bleeding. The patient wears seatbelts: yes. The patient participates in regular exercise: no. Has the patient ever been transfused or tattooed?: no. The patient reports that there is not domestic violence in her life. Current complaints: 1.  None.  Does report recent cardiac stent placement several months ago.    Gynecologic History No LMP recorded. Patient has had a hysterectomy. Contraception: status post hysterectomy Last Pap: 08/2012, normal.  Last mammogram: 07/2015. Results were: BIRADS 2 - Benign Last Colonoscopy: 2009. Diverticulosis in right sigmoid colon found.  Last Dexa Scan: patient unsure, but states it has been over 2-3 years.    Obstetric History OB History  Gravida Para Term Preterm AB SAB TAB Ectopic Multiple Living  _0 # Outcome Date GA Lbr Len/2nd Weight Sex Delivery Anes PTL Lv  2 Term 1966 [redacted]w[redacted]d  M Vag-Spont     1 Term [id number] M Vag-Spont         Past Medical History  Diagnosis Date  . Migraine   . Diverticulitis   . Gastritis   . Hyperlipemia   . Colitis   . GERD (gastroesophageal reflux disease)   . Osteoarthritis   . Hemorrhoid   . 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 Kidney cysts     Left  . Kidney stones   . Lipoma of shoulder 2009    left  . Hemorrhoids   . Arthritis   . Osteopenia   . Cataracts, both eyes   . Detached vitreous humor   . Coronary artery disease   . Hypertension   . Stented coronary artery   . 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     2009 breast cancer stage IIA T2 N0, ER positive, HER-2 negative, 2.5 cm  primary tumor, poorly differentiated, status post wide local excision, one sentinel lymph node negative, high-risk Oncotype score 48    Family History  Problem Relation Age of Onset  . Heart disease Mother   . Diabetes Father   . Heart disease Father   . Diabetes Sister   . Diabetes Brother   . Cervical cancer Cousin   . Breast cancer Cousin   . Lung cancer Paternal Aunt   . Prostate cancer Maternal Uncle   . Leukemia    . Hypertension      Past Surgical History  Procedure Laterality Date  . Benign breast biopsy  2007  . Partial hysterectomy    . Upper gastrointestinal endoscopy    . Colonoscopy    . Abdominal hysterectomy    . Breast surgery    . Eye surgery    . Coronary angioplasty    . Cardiac catheterization N/A 06/22/2015    Procedure: Left Heart Cath and Coronary Angiography;  Surgeon: KeTeodoro SprayMD;  [facility];  Service: Cardiovascular;  Laterality: N/A;  . Breast biopsy Left 2007    positive  . Breast cyst aspiration Right     neg  . Foot fracture surgery  Left   . Cardiac catheterization    . Cataract extraction      Social History   Social History  . Marital Status: Married    Spouse Name: N/A  . Number of Children: N/A  . Years of Education: N/A   Occupational History  . Not on file.   Social History Main Topics  . Smoking status: Former Smoker    Types: Cigarettes    Quit date: 08/14/1993  . Smokeless tobacco: Never Used  . Alcohol Use: No  . Drug Use: No  . Sexual Activity: Yes    Birth Control/ Protection: None, Post-menopausal   Other Topics Concern  . Not on file   Social History Narrative    Current Outpatient Prescriptions on File Prior to Visit  Medication Sig Dispense Refill  . ALPRAZolam (XANAX) 0.25 MG tablet TAKE ONE-HALF TABLET BY MOUTH AT BEDTIME    . aspirin EC 81 MG tablet Take by mouth.    . Azelaic Acid 15 % cream Apply topically.    . butalbital-aspirin-caffeine (FIORINAL) 50-325-40 MG per  capsule Take by mouth.    . calcium carbonate (TUMS - DOSED IN MG ELEMENTAL CALCIUM) 500 MG chewable tablet Chew by mouth.    . Cholecalciferol (VITAMIN D-1000 MAX ST) 1000 UNITS tablet Take by mouth.    . docusate sodium (STOOL SOFTENER) 100 MG capsule Take by mouth.    . ezetimibe (ZETIA) 10 MG tablet Take by mouth.    . gabapentin (NEURONTIN) 300 MG capsule TAKE ONE CAPSULE BY MOUTH THREE TIMES DAILY    . Multiple Vitamin (MULTI-VITAMINS) TABS Take by mouth.    Marland Kitchen omeprazole (PRILOSEC) 20 MG capsule Take 20 mg by mouth 2 (two) times daily before a meal.     No current facility-administered medications on file prior to visit.    Allergies  Allergen Reactions  . Alendronate     Other reaction(s): Unknown  . Alendronate Sodium     Other reaction(s): Unknown  . Atorvastatin     Other reaction(s): Muscle Pain Other reaction(s): Muscle Pain  . Azithromycin Hives  . Ciprofloxacin     Other reaction(s): Headache  . Colestipol     Other reaction(s): Unknown  . Lidocaine Hives  . Lovastatin     Other reaction(s): Headache Other reaction(s): Headache  . Metronidazole     Other reaction(s): Headache  . Niacin     Other reaction(s): Unknown  . Risedronate     Other reaction(s): Unknown  . Risedronate Sodium Other (See Comments)  . Rosuvastatin     Other reaction(s): Unknown Other reaction(s): Unknown  . Simvastatin     Other reaction(s): Headache Other reaction(s): Headache     Review of Systems ROS Review of Systems - General ROS: negative for - chills, fatigue, fever, hot flashes, night sweats, weight gain or weight loss Psychological ROS: negative for - anxiety, decreased libido, depression, mood swings, physical abuse or sexual abuse Ophthalmic ROS: negative for - blurry vision, eye pain or loss of vision ENT ROS: negative for - headaches, hearing change, visual changes or vocal changes Allergy and Immunology ROS: negative for - hives, itchy/watery eyes or seasonal  allergies Hematological and Lymphatic ROS: negative for - bleeding problems, bruising, swollen lymph nodes or weight loss Endocrine ROS: negative for - galactorrhea, hair pattern changes, hot flashes, malaise/lethargy, mood swings, palpitations, polydipsia/polyuria, skin changes, temperature intolerance or unexpected weight changes Breast ROS: negative for - new or changing breast lumps or nipple discharge Respiratory ROS: negative  for - cough or shortness of breath Cardiovascular ROS: negative for - chest pain, irregular heartbeat, palpitations or shortness of breath Gastrointestinal ROS: no abdominal pain, change in bowel habits, or black or bloody stools Genito-Urinary ROS: no dysuria, trouble voiding, or hematuria Musculoskeletal ROS: negative for - joint pain or joint stiffness Neurological ROS: negative for - bowel and bladder control changes Dermatological ROS: negative for rash and skin lesion changes   Objective:   BP 107/62 mmHg  Pulse 67  Ht _0  (1.6 m)  Wt 97 lb 12.8 oz (44.362 kg)  BMI 17.33 kg/m2 CONSTITUTIONAL: Well-developed, well-nourished female in no acute distress.  PSYCHIATRIC: Normal mood and affect. Normal behavior. Normal judgment and thought content. [place]: Alert and oriented to person, place, and time. Normal muscle tone coordination. No cranial nerve deficit noted. HENT:  Normocephalic, atraumatic, External right and left ear normal. Oropharynx is clear and moist EYES: Conjunctivae and EOM are normal. Pupils are equal, round, and reactive to light. No scleral icterus.  NECK: Normal range of motion, supple, no masses.  Normal thyroid.  SKIN: Skin is warm and dry. No rash noted. Not diaphoretic. No erythema. No pallor. CARDIOVASCULAR: Normal heart rate noted, regular rhythm, no murmur. RESPIRATORY: Clear to auscultation bilaterally. Effort and breath sounds normal, no problems with respiration noted. BREASTS: Symmetric in size. No masses, skin changes,  nipple drainage, or lymphadenopathy. ABDOMEN: Soft, normal bowel sounds, no distention noted.  No tenderness, rebound or guarding.  BLADDER: Normal PELVIC:  Bladder no bladder distension noted  Urethra: normal appearing urethra with no masses, tenderness or lesions (small caruncle noted)  Vulva: normal appearing vulva with no masses, tenderness or lesions  Vagina: atrophic and Pelvic Floor Exam cystocele Grade 1-2, rectocele Grade 1, vaginal prolapse Grade 2  Cervix: not indicated and surgically absent  Uterus: not indicated and surgically absent, vaginal cuff well healed  Adnexa: normal adnexa in size, nontender and no masses  RV: External Exam NormaI, No Rectal Masses and Normal Sphincter tone  MUSCULOSKELETAL: Normal range of motion. No tenderness.  No cyanosis, clubbing, or edema.  2+ distal pulses. LYMPHATIC: No Axillary, Supraclavicular, or Inguinal Adenopathy.    Assessment:   Annual gynecologic examination 75 y.o. Underweight Vaginal vault prolapse (cystocele and rectocele) Vaginal atrophy H/o breast cancer  Plan:  Pap: Not needed.  Patient beyond age 63, and h/o hysterectomy.  Mammogram: completed 07/2015. Benign.  Stool Guaiac Testing:  Not Ordered.  Has h/o Colonoscopy in 2009.  Labs: To be performed by PCP (patient notes in upcoming future appointment) Routine preventative health maintenance measures emphasized: Exercise/Diet/Weight control, Alcohol/Substance use risks and Stress Management  Vaginal vault prolapse - patient relatively asymptomatic (no bowel or urinary disturbances, incontinence, vaginal bulge).  No treatment desired currently.  Vaginal atrophy - patient without complaints, no intervention necessary currently.    Return to [place], MD  [facility]

## 2015-10-27 NOTE — Patient Instructions (Signed)
Health Maintenance, Female Adopting a healthy lifestyle and getting preventive care can go a long way to promote health and wellness. Talk with your health care provider about what schedule of regular examinations is right for you. This is a good chance for you to check in with your provider about disease prevention and staying healthy. In between checkups, there are plenty of things you can do on your own. Experts have done a lot of research about which lifestyle changes and preventive measures are most likely to keep you healthy. Ask your health care provider for more information. WEIGHT AND DIET  Eat a healthy diet  Be sure to include plenty of vegetables, fruits, low-fat dairy products, and lean protein.  Do not eat a lot of foods high in solid fats, added sugars, or salt.  Get regular exercise. This is one of the most important things you can do for your health.  Most adults should exercise for at least 150 minutes each week. The exercise should increase your heart rate and make you sweat (moderate-intensity exercise).  Most adults should also do strengthening exercises at least twice a week. This is in addition to the moderate-intensity exercise.  Maintain a healthy weight  Body mass index (BMI) is a measurement that can be used to identify possible weight problems. It estimates body fat based on height and weight. Your health care provider can help determine your BMI and help you achieve or maintain a healthy weight.  For females 20 years of age and older:   A BMI below 18.5 is considered underweight.  A BMI of 18.5 to 24.9 is normal.  A BMI of 25 to 29.9 is considered overweight.  A BMI of 30 and above is considered obese.  Watch levels of cholesterol and blood lipids  You should start having your blood tested for lipids and cholesterol at 75 years of age, then have this test every 5 years.  You may need to have your cholesterol levels checked more often if:  Your lipid  or cholesterol levels are high.  You are older than 75 years of age.  You are at high risk for heart disease.  CANCER SCREENING   Lung Cancer  Lung cancer screening is recommended for adults 55-80 years old who are at high risk for lung cancer because of a history of smoking.  A yearly low-dose CT scan of the lungs is recommended for people who:  Currently smoke.  Have quit within the past 15 years.  Have at least a 30-pack-year history of smoking. A pack year is smoking an average of one pack of cigarettes a day for 1 year.  Yearly screening should continue until it has been 15 years since you quit.  Yearly screening should stop if you develop a health problem that would prevent you from having lung cancer treatment.  Breast Cancer  Practice breast self-awareness. This means understanding how your breasts normally appear and feel.  It also means doing regular breast self-exams. Let your health care provider know about any changes, no matter how small.  If you are in your 20s or 30s, you should have a clinical breast exam (CBE) by a health care provider every 1-3 years as part of a regular health exam.  If you are 40 or older, have a CBE every year. Also consider having a breast X-ray (mammogram) every year.  If you have a family history of breast cancer, talk to your health care provider about genetic screening.  If you   are at high risk for breast cancer, talk to your health care provider about having an MRI and a mammogram every year.  Breast cancer gene (BRCA) assessment is recommended for women who have family members with BRCA-related cancers. BRCA-related cancers include:  Breast.  Ovarian.  Tubal.  Peritoneal cancers.  Results of the assessment will determine the need for genetic counseling and BRCA1 and BRCA2 testing. Cervical Cancer Your health care provider may recommend that you be screened regularly for cancer of the pelvic organs (ovaries, uterus, and  vagina). This screening involves a pelvic examination, including checking for microscopic changes to the surface of your cervix (Pap test). You may be encouraged to have this screening done every 3 years, beginning at age 29.  For women ages 87-65, health care providers may recommend pelvic exams and Pap testing every 3 years, or they may recommend the Pap and pelvic exam, combined with testing for human papilloma virus (HPV), every 5 years. Some types of HPV increase your risk of cervical cancer. Testing for HPV may also be done on women of any age with unclear Pap test results.  Other health care providers may not recommend any screening for nonpregnant women who are considered low risk for pelvic cancer and who do not have symptoms. Ask your health care provider if a screening pelvic exam is right for you.  If you have had past treatment for cervical cancer or a condition that could lead to cancer, you need Pap tests and screening for cancer for at least 20 years after your treatment. If Pap tests have been discontinued, your risk factors (such as having a new sexual partner) need to be reassessed to determine if screening should resume. Some women have medical problems that increase the chance of getting cervical cancer. In these cases, your health care provider may recommend more frequent screening and Pap tests. Colorectal Cancer  This type of cancer can be detected and often prevented.  Routine colorectal cancer screening usually begins at 75 years of age and continues through 75 years of age.  Your health care provider may recommend screening at an earlier age if you have risk factors for colon cancer.  Your health care provider may also recommend using home test kits to check for hidden blood in the stool.  A small camera at the end of a tube can be used to examine your colon directly (sigmoidoscopy or colonoscopy). This is done to check for the earliest forms of colorectal  cancer.  Routine screening usually begins at age 55.  Direct examination of the colon should be repeated every 5-10 years through 75 years of age. However, you may need to be screened more often if early forms of precancerous polyps or small growths are found. Skin Cancer  Check your skin from head to toe regularly.  Tell your health care provider about any new moles or changes in moles, especially if there is a change in a mole's shape or color.  Also tell your health care provider if you have a mole that is larger than the size of a pencil eraser.  Always use sunscreen. Apply sunscreen liberally and repeatedly throughout the day.  Protect yourself by wearing long sleeves, pants, a wide-brimmed hat, and sunglasses whenever you are outside. HEART DISEASE, DIABETES, AND HIGH BLOOD PRESSURE   High blood pressure causes heart disease and increases the risk of stroke. High blood pressure is more likely to develop in:  People who have blood pressure in the high end  of the normal range (130-139/85-89 mm Hg).  People who are overweight or obese.  People who are African American.  If you are 38-23 years of age, have your blood pressure checked every 3-5 years. If you are 61 years of age or older, have your blood pressure checked every year. You should have your blood pressure measured twice--once when you are at a hospital or clinic, and once when you are not at a hospital or clinic. Record the average of the two measurements. To check your blood pressure when you are not at a hospital or clinic, you can use:  An automated blood pressure machine at a pharmacy.  A home blood pressure monitor.  If you are between 45 years and 39 years old, ask your health care provider if you should take aspirin to prevent strokes.  Have regular diabetes screenings. This involves taking a blood sample to check your fasting blood sugar level.  If you are at a normal weight and have a low risk for diabetes,  have this test once every three years after 75 years of age.  If you are overweight and have a high risk for diabetes, consider being tested at a younger age or more often. PREVENTING INFECTION  Hepatitis B  If you have a higher risk for hepatitis B, you should be screened for this virus. You are considered at high risk for hepatitis B if:  You were born in a country where hepatitis B is common. Ask your health care provider which countries are considered high risk.  Your parents were born in a high-risk country, and you have not been immunized against hepatitis B (hepatitis B vaccine).  You have HIV or AIDS.  You use needles to inject street drugs.  You live with someone who has hepatitis B.  You have had sex with someone who has hepatitis B.  You get hemodialysis treatment.  You take certain medicines for conditions, including cancer, organ transplantation, and autoimmune conditions. Hepatitis C  Blood testing is recommended for:  Everyone born from [birth year] through 1965.  Anyone with known risk factors for hepatitis C. Sexually transmitted infections (STIs)  You should be screened for sexually transmitted infections (STIs) including gonorrhea and chlamydia if:  You are sexually active and are younger than 75 years of age.  You are older than 75 years of age and your health care provider tells you that you are at risk for this type of infection.  Your sexual activity has changed since you were last screened and you are at an increased risk for chlamydia or gonorrhea. Ask your health care provider if you are at risk.  If you do not have HIV, but are at risk, it may be recommended that you take a prescription medicine daily to prevent HIV infection. This is called pre-exposure prophylaxis (PrEP). You are considered at risk if:  You are sexually active and do not regularly use condoms or know the HIV status of your partner(s).  You take drugs by injection.  You are sexually  active with a partner who has HIV. Talk with your health care provider about whether you are at high risk of being infected with HIV. If you choose to begin PrEP, you should first be tested for HIV. You should then be tested every 3 months for as long as you are taking PrEP.  PREGNANCY   If you are premenopausal and you may become pregnant, ask your health care provider about preconception counseling.  If you may  become pregnant, take 400 to 800 micrograms (mcg) of folic acid every day.  If you want to prevent pregnancy, talk to your health care provider about birth control (contraception). OSTEOPOROSIS AND MENOPAUSE   Osteoporosis is a disease in which the bones lose minerals and strength with aging. This can result in serious bone fractures. Your risk for osteoporosis can be identified using a bone density scan.  If you are 61 years of age or older, or if you are at risk for osteoporosis and fractures, ask your health care provider if you should be screened.  Ask your health care provider whether you should take a calcium or vitamin D supplement to lower your risk for osteoporosis.  Menopause may have certain physical symptoms and risks.  Hormone replacement therapy may reduce some of these symptoms and risks. Talk to your health care provider about whether hormone replacement therapy is right for you.  HOME CARE INSTRUCTIONS   Schedule regular health, dental, and eye exams.  Stay current with your immunizations.   Do not use any tobacco products including cigarettes, chewing tobacco, or electronic cigarettes.  If you are pregnant, do not drink alcohol.  If you are breastfeeding, limit how much and how often you drink alcohol.  Limit alcohol intake to no more than 1 drink per day for nonpregnant women. One drink equals 12 ounces of beer, 5 ounces of wine, or 1 ounces of hard liquor.  Do not use street drugs.  Do not share needles.  Ask your health care provider for help if  you need support or information about quitting drugs.  Tell your health care provider if you often feel depressed.  Tell your health care provider if you have ever been abused or do not feel safe at home.   This information is not intended to replace advice given to you by your health care provider. Make sure you discuss any questions you have with your health care provider.   Document Released: 02/13/2011 Document Revised: 08/21/2014 Document Reviewed: 07/02/2013 Elsevier Interactive Patient Education [employer].

## 2015-11-18 ENCOUNTER — Inpatient Hospital Stay: Payer: Medicare Other | Attending: Internal Medicine | Admitting: Internal Medicine

## 2015-11-18 VITALS — BP 126/62 | HR 58 | Temp 97.0°F | Resp 18 | Ht 63.5 in | Wt 95.9 lb

## 2015-11-18 DIAGNOSIS — Z8669 Personal history of other diseases of the nervous system and sense organs: Secondary | ICD-10-CM | POA: Insufficient documentation

## 2015-11-18 DIAGNOSIS — K219 Gastro-esophageal reflux disease without esophagitis: Secondary | ICD-10-CM | POA: Diagnosis not present

## 2015-11-18 DIAGNOSIS — M1990 Unspecified osteoarthritis, unspecified site: Secondary | ICD-10-CM

## 2015-11-18 DIAGNOSIS — I2510 Atherosclerotic heart disease of native coronary artery without angina pectoris: Secondary | ICD-10-CM | POA: Diagnosis not present

## 2015-11-18 DIAGNOSIS — Z853 Personal history of malignant neoplasm of breast: Secondary | ICD-10-CM

## 2015-11-18 DIAGNOSIS — M8580 Other specified disorders of bone density and structure, unspecified site: Secondary | ICD-10-CM | POA: Diagnosis not present

## 2015-11-18 DIAGNOSIS — Z803 Family history of malignant neoplasm of breast: Secondary | ICD-10-CM

## 2015-11-18 DIAGNOSIS — Z87891 Personal history of nicotine dependence: Secondary | ICD-10-CM | POA: Insufficient documentation

## 2015-11-18 DIAGNOSIS — I10 Essential (primary) hypertension: Secondary | ICD-10-CM | POA: Insufficient documentation

## 2015-11-18 DIAGNOSIS — Z801 Family history of malignant neoplasm of trachea, bronchus and lung: Secondary | ICD-10-CM | POA: Diagnosis not present

## 2015-11-18 DIAGNOSIS — Z79899 Other long term (current) drug therapy: Secondary | ICD-10-CM | POA: Insufficient documentation

## 2015-11-18 DIAGNOSIS — Z9221 Personal history of antineoplastic chemotherapy: Secondary | ICD-10-CM | POA: Insufficient documentation

## 2015-11-18 DIAGNOSIS — Z7982 Long term (current) use of aspirin: Secondary | ICD-10-CM | POA: Diagnosis not present

## 2015-11-18 DIAGNOSIS — Z808 Family history of malignant neoplasm of other organs or systems: Secondary | ICD-10-CM

## 2015-11-18 DIAGNOSIS — C50912 Malignant neoplasm of unspecified site of left female breast: Secondary | ICD-10-CM

## 2015-11-18 DIAGNOSIS — E785 Hyperlipidemia, unspecified: Secondary | ICD-10-CM | POA: Insufficient documentation

## 2015-11-18 DIAGNOSIS — Z923 Personal history of irradiation: Secondary | ICD-10-CM

## 2015-11-18 DIAGNOSIS — Z8719 Personal history of other diseases of the digestive system: Secondary | ICD-10-CM

## 2015-11-18 DIAGNOSIS — Z87442 Personal history of urinary calculi: Secondary | ICD-10-CM | POA: Insufficient documentation

## 2015-11-18 NOTE — Progress Notes (Signed)
RN Chaperoned provider with Breast Exam.   

## 2015-11-18 NOTE — Progress Notes (Signed)
Patient ambulates without assistance, accompanied by husband.  Patient denies  Pain or discomfort, vitals documented.  Medication record updated.

## 2015-11-18 NOTE — Progress Notes (Signed)
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 Patient denies new lumps or bumps.  Patient denies any unusual shortness of breath or cough or unusual bone pain. Her appetite is fair. No unusual weight loss. No nausea no vomiting. Chronic tingling and numbness in the feet.  REVIEW OF SYSTEMS:  A complete 10 point review of system is done which is negative except mentioned above/history of present illness.   PAST MEDICAL HISTORY :  Past Medical History  Diagnosis Date  . Migraine   . Diverticulitis   . Gastritis   . Hyperlipemia   . Colitis   . GERD (gastroesophageal reflux disease)   . Osteoarthritis   . Hemorrhoid   . 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 Kidney cysts     Left  . Kidney stones   . Lipoma of shoulder 2009    left  . Hemorrhoids   . Arthritis   . Osteopenia   . Cataracts, both eyes   . Detached vitreous humor   . Coronary artery disease   . Hypertension   . Stented coronary artery   . 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 Breast cancer [facility]) 2007     2009 breast cancer stage IIA T2 N0, ER positive, HER-2 negative, 2.5 cm primary tumor, poorly differentiated, status post wide local excision, one sentinel lymph node negative, high-risk Oncotype score 48    PAST SURGICAL HISTORY :   Past Surgical History  Procedure Laterality Date  . Benign breast biopsy  2007  . Partial hysterectomy    . Upper  gastrointestinal endoscopy    . Colonoscopy    . Abdominal hysterectomy    . Breast surgery    . Eye surgery    . Coronary angioplasty    . Cardiac catheterization N/A 06/22/2015    Procedure: Left Heart Cath and Coronary Angiography;  Surgeon: Teodoro Spray, MD;  Location: [facility];  Service: Cardiovascular;  Laterality: N/A;  . Breast biopsy Left 2007    positive  . Breast cyst aspiration Right     neg  . Foot fracture surgery Left   . Cardiac catheterization    . Cataract extraction      FAMILY HISTORY :   Family History  Problem Relation Age of Onset  . Heart disease Mother   . Diabetes Father   . Heart disease Father   . Diabetes Sister   . Diabetes Brother   . Cervical cancer Cousin   . Breast cancer Cousin   . Lung cancer Paternal Aunt   . Prostate cancer Maternal Uncle   . Leukemia    . Hypertension      SOCIAL HISTORY:   Social History  Substance Use Topics  . Smoking status: Former Smoker    Types: Cigarettes    Quit date: 08/14/1993  . Smokeless tobacco: Never Used  . Alcohol  Use: No    ALLERGIES:  is allergic to alendronate; alendronate sodium; atorvastatin; azithromycin; ciprofloxacin; colestipol; lidocaine; lovastatin; metronidazole; niacin; risedronate; risedronate sodium; rosuvastatin; and simvastatin.  MEDICATIONS:  Current Outpatient Prescriptions  Medication Sig Dispense Refill  . ALPRAZolam (XANAX) 0.25 MG tablet TAKE ONE-HALF TABLET BY MOUTH AT BEDTIME    . aspirin EC 81 MG tablet Take by mouth.    . Azelaic Acid 15 % cream Apply topically. Reported on 11/18/2015    . butalbital-aspirin-caffeine (FIORINAL) 50-325-40 MG per capsule Take by mouth.    . calcium carbonate (TUMS - DOSED IN MG ELEMENTAL CALCIUM) 500 MG chewable tablet Chew by mouth.    . Cholecalciferol (VITAMIN D-1000 MAX ST) 1000 UNITS tablet Take by mouth.    . docusate sodium (STOOL SOFTENER) 100 MG capsule Take by mouth.    . ezetimibe (ZETIA) 10 MG tablet Take by  mouth.    . gabapentin (NEURONTIN) 300 MG capsule TAKE ONE CAPSULE BY MOUTH THREE TIMES DAILY    . metoprolol succinate (TOPROL-XL) 25 MG 24 hr tablet Take by mouth.    . Multiple Vitamin (MULTI-VITAMINS) TABS Take by mouth.    . nitroGLYCERIN (NITROSTAT) 0.4 MG SL tablet Place under the tongue.    Marland Kitchen omeprazole (PRILOSEC) 20 MG capsule Take 20 mg by mouth 2 (two) times daily before a meal.     No current facility-administered medications for this visit.    PHYSICAL EXAMINATION: ECOG PERFORMANCE STATUS: 0 - Asymptomatic  BP 126/62 mmHg  Pulse 58  Temp(Src) 97 F (36.1 C) (Tympanic)  Ht 5' 3.5" (1.613 m)  Wt 95 lb 14.4 oz (43.5 kg)  BMI 16.72 kg/m2  Filed Weights   11/18/15 0955  Weight: 95 lb 14.4 oz (43.5 kg)    GENERAL: Well-nourished well-developed; Alert, no distress and comfortable.  Thin built. She is accompanied by her husband. EYES: no pallor or icterus OROPHARYNX: no thrush or ulceration; good dentition  NECK: supple, no masses felt LYMPH:  no palpable lymphadenopathy in the cervical, axillary or inguinal regions LUNGS: clear to auscultation and  No wheeze or crackles HEART/CVS: regular rate & rhythm and no murmurs; No lower extremity edema ABDOMEN:abdomen soft, non-tender and normal bowel sounds Musculoskeletal:no cyanosis of digits and no clubbing  PSYCH: alert & oriented x 3 with fluent speech NEURO: no focal motor/sensory deficits SKIN:  no rashes or significant lesions Breast exam [in presence of husband]- right breast- no skin changes no nipple changes no masses felt. Left breast- lumpectomy scar noted otherwise no masses felt.  LABORATORY DATA:  I have reviewed the data as listed    Component Value Date/Time   NA 141 05/14/2015 0415   NA 143 12/31/2013 0406   K 4.1 05/14/2015 0415   K 4.5 12/31/2013 0406   CL 106 05/14/2015 0415   CL 111* 12/31/2013 0406   CO2 29 05/14/2015 0415   CO2 28 12/31/2013 0406   GLUCOSE 99 05/14/2015 0415   GLUCOSE 115*  12/31/2013 0406   BUN 18 05/14/2015 0415   BUN 10 12/31/2013 0406   CREATININE 0.77 05/14/2015 0415   CREATININE 0.87 12/31/2013 0406   CALCIUM 9.7 05/14/2015 0415   CALCIUM 8.6 12/31/2013 0406   GFRNONAA >60 05/14/2015 0415   GFRNONAA >60 12/31/2013 0406   GFRAA >60 05/14/2015 0415   GFRAA >60 12/31/2013 0406    No results found for: SPEP, UPEP  Lab Results  Component Value Date   WBC 7.3 05/14/2015   HGB 14.1 05/14/2015  HCT 44.2 05/14/2015   MCV 80.5 05/14/2015   PLT 251 05/14/2015      Chemistry      Component Value Date/Time   NA 141 05/14/2015 0415   NA 143 12/31/2013 0406   K 4.1 05/14/2015 0415   K 4.5 12/31/2013 0406   CL 106 05/14/2015 0415   CL 111* 12/31/2013 0406   CO2 29 05/14/2015 0415   CO2 28 12/31/2013 0406   BUN 18 05/14/2015 0415   BUN 10 12/31/2013 0406   CREATININE 0.77 05/14/2015 0415   CREATININE 0.87 12/31/2013 0406      Component Value Date/Time   CALCIUM 9.7 05/14/2015 0415   CALCIUM 8.6 12/31/2013 0406     ASSESSMENT & PLAN:   # Breast cancer- stage II ER/PR positive HER-2/neu negative high risk Oncotype status post chemotherapy [2009]; adjuvant hormone therapy until 2012.   Clinically, based on history/review of system and physical findings; no evidence of any recurrence noted.  Mammogram 2016 December negative- reviewed the mammogram with the patient.  # Recommend follow-up in one year; mammogram will be ordered through her primary care physician. She'll call us if she has any concerning signs and symptoms in between.  No labs.      Cammie Sickle, MD 11/18/2015 10:06 AM

## 2015-12-22 DIAGNOSIS — E010 Iodine-deficiency related diffuse (endemic) goiter: Secondary | ICD-10-CM | POA: Insufficient documentation

## 2016-04-26 ENCOUNTER — Ambulatory Visit: Payer: Medicare Other | Attending: Orthopedic Surgery | Admitting: Occupational Therapy

## 2016-04-26 DIAGNOSIS — M25541 Pain in joints of right hand: Secondary | ICD-10-CM | POA: Insufficient documentation

## 2016-04-26 DIAGNOSIS — M6281 Muscle weakness (generalized): Secondary | ICD-10-CM | POA: Diagnosis present

## 2016-04-26 DIAGNOSIS — M65331 Trigger finger, right middle finger: Secondary | ICD-10-CM | POA: Diagnosis not present

## 2016-04-26 NOTE — Patient Instructions (Signed)
Contrast  Wear MC block splint on 3rd to wear with sustained and tight grip - repetitive grip  Ice massage if needed ofr 3rd A1 pulley   Ed on joint protection principles - hand out provided

## 2016-04-26 NOTE — Therapy (Signed)
[place] [facility] [address] [place], [address] Phone: [phone]   Fax:  [phone]  Occupational Therapy Treatment  Patient Details  Name: Kathleen Walls MRN: [id number] Date of Birth: 1941/04/17 Referring Provider: Rudene Christians  Encounter Date: 04/26/2016      OT End of Session - 04/26/16 1120    Visit Number 1   Number of Visits 6   Date for OT Re-Evaluation 05/17/16   OT Start Time 0808   OT Stop Time 0905   OT Time Calculation (min) 57 min   Activity Tolerance Patient tolerated treatment well   Behavior During Therapy [facility] for tasks assessed/performed      Past Medical History:  Diagnosis Date  . Arthritis   . Breast cancer [facility]) 2007    2009 breast cancer stage IIA T2 N0, ER positive, HER-2 negative, 2.5 cm primary tumor, poorly differentiated, status post wide local excision, one sentinel lymph node negative, high-risk Oncotype score 48  . Cataracts, both eyes   . Colitis   . Coronary artery disease   . Detached vitreous humor   . Diverticulitis   . 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 Gastritis   . GERD (gastroesophageal reflux disease)   . 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 Hemorrhoid   . Hemorrhoids   . Hyperlipemia   . Hypertension   . 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 Kidney cysts    Left  . Kidney stones   . Lipoma of shoulder 2009   left  . Migraine   . Osteoarthritis   . Osteopenia   . Stented coronary artery     Past Surgical History:  Procedure Laterality Date  . ABDOMINAL HYSTERECTOMY    . benign breast biopsy  2007  . BREAST BIOPSY Left 2007   positive  . BREAST CYST ASPIRATION Right    neg  . BREAST SURGERY    . CARDIAC CATHETERIZATION N/A 06/22/2015   Procedure: Left Heart Cath and Coronary Angiography;  Surgeon: Teodoro Spray, MD;  Location: [facility];  Service: Cardiovascular;  Laterality: N/A;  .  CARDIAC CATHETERIZATION    . CATARACT EXTRACTION    . COLONOSCOPY    . CORONARY ANGIOPLASTY    . EYE SURGERY    . FOOT FRACTURE SURGERY Left   . PARTIAL HYSTERECTOMY    . UPPER GASTROINTESTINAL ENDOSCOPY      There were no vitals filed for this visit.      Subjective Assessment - 04/26/16 1111    Subjective  My finger pain started about middle July - we came back  for 2 month trip with RV , was unpacking , cleaning , pulling weed on garden - probable using my hand a lot - and I have the new shelve in my closet and I hit my middle knuckle a lot agains it - hurts on outside - after shot better - but pain still there    Patient Stated Goals Want the pain better - that I can use my hand to wring washcloth , make tight grip , vacuum, unpack dishwasher, open carton   Currently in Pain? Yes   Pain Score 3    Pain Location Hand   Pain Orientation Right   Pain Descriptors / Indicators Aching            [facility] OT Assessment - 04/26/16 0001  Assessment   Diagnosis R 2nd and 3rd digit trigger finger   Referring Provider Rudene Christians   Onset Date 02/26/16     Home  Environment   Lives With Spouse     Prior Function   Vocation Retired   Leisure R hand dominant , like to travel with RV, read,  cooking, on tablet      AROM   Overall AROM Comments R hand MC's and PIP's AROM WNL but pain at 3rd dorsal MC during composite fist and MC flexion and PIP extention      Strength   Right Hand Grip (lbs) 25   Right Hand Lateral Pinch 7 lbs   Right Hand 3 Point Pinch 6 lbs   Left Hand Grip (lbs) 28   Left Hand Lateral Pinch 9 lbs   Left Hand 3 Point Pinch 7 lbs             Paraifin done to R hand to decrease pain with AROM - did show decrease pain  Reviewed home program  And fitted and custom MC block splint for 3rd to wear during composite tight or repetitive grip              OT Education - 04/26/16 1119    Education provided Yes   Education Details finding and HEP    Person(s) Educated Patient   Methods Explanation;Demonstration;Tactile cues;Verbal cues;Handout   Comprehension Verbal cues required;Returned demonstration;Verbalized understanding          OT Short Term Goals - 04/26/16 1130      OT SHORT TERM GOAL #1   Title Pt albe to make full fist with no increase pain or symptoms at 3rd digit   Baseline pain about 3/10    Time 2   Period Weeks   Status New     OT SHORT TERM GOAL #2   Title Pt verbalize 3 joint protection adaptations she is using during ADL's and IADL's to decrease symptoms    Baseline no knowledge on modifcations    Time 2   Period Weeks   Status New           OT Long Term Goals - 04/26/16 1132      OT LONG TERM GOAL #1   Title Quickdash score improve with 5-10 points    Baseline Quidkdash score 25    Time 3   Period Weeks   Status New     OT LONG TERM GOAL #2   Title Grip and prehension strenght in R hand improve with at least 3-5 lbs to use hand to wring washcloth without pain    Baseline Grip 25 R , L 28 ; Lat R7, L 9; 3pointg R 6 , L 7 - with ulnar deviation of 2nd and 3rd    Time 3   Period Weeks   Status New               Plan - 04/26/16 1120    Clinical Impression Statement Pt present with diagnosis of R 2nd and 3rd trigger finger - pt had pain and tenderness over 3rd A1pulley - but report pain with AROM and composite fist over dorsal 3rd MC and proximal phalanges - pt report she has been hitting that pard of her hand  a few times on new shef  at home - during assessment pt  pain with composite fist - but AROM WNL - pt do show ulnar deivation  out of MC at 2nd and 3rd  digist - and increase deviation with loading prehension strength  - Pt R hand dominant but grip and prehension strength is decrease compare to L - and  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  - pt can benefit from OT /hand therapy    Rehab Potential Good   OT Frequency 2x / week   OT Duration 4 weeks   OT  Treatment/Interventions Self-care/ADL training;Contrast Bath;Parrafin;Iontophoresis;Ultrasound;Splinting;Patient/family education;Therapeutic exercises;Manual Therapy  Ionto with dexamethazone    Plan assess use of splint , modifying activities    OT Home Exercise Plan see pt instruction    Consulted and Agree with Plan of Care Patient      Patient will benefit from skilled therapeutic intervention in order to improve the following deficits and impairments:  Decreased knowledge of precautions, Pain, Impaired UE functional use, Decreased knowledge of use of DME, Decreased strength  Visit Diagnosis: Trigger middle finger, right - Plan: Ot plan of care cert/re-cert  Pain in joint of right hand - Plan: Ot plan of care cert/re-cert  Muscle weakness (generalized) - Plan: Ot plan of care cert/re-cert    Problem List Patient Active Problem List   Diagnosis Date Noted  . Breast cancer, left breast ([place]) 05/20/2015  . Chest pain 05/14/2015  . Arteriosclerosis of coronary artery 07/22/2014  . Disorder of mitral valve 01/17/2014  . Absolute anemia 10/23/2013  . Benign hypertension 10/23/2013  . Dermatophytic onychia 10/23/2013  . HLD (hyperlipidemia) 10/23/2013  . Idiopathic peripheral neuropathy ([place]) 10/23/2013    Rosalyn Gess OTR/L,CLT  04/26/2016, 11:39 AM  [place] [facility] [address] [place], [address] Phone: [phone]   Fax:  [phone]  Name: Kathleen Walls MRN: [id number] Date of Birth: March 19, 1941

## 2016-05-02 ENCOUNTER — Ambulatory Visit: Payer: Medicare Other | Admitting: Occupational Therapy

## 2016-05-02 DIAGNOSIS — M6281 Muscle weakness (generalized): Secondary | ICD-10-CM

## 2016-05-02 DIAGNOSIS — M25541 Pain in joints of right hand: Secondary | ICD-10-CM

## 2016-05-02 DIAGNOSIS — M65331 Trigger finger, right middle finger: Secondary | ICD-10-CM | POA: Diagnosis not present

## 2016-05-02 NOTE — Patient Instructions (Signed)
Contrast  Composite extention PROM done 10 reps  Tendon glides - but only gentle AROM - not force Still cont with joint  Protection - use larger joints, enlarge handles and avoid tight and sustained grip     Use  custom MC block splint for 3rd to  during composite tight or repetitive grip

## 2016-05-02 NOTE — Therapy (Signed)
[place] [facility] [address], [place], [address] Phone: [phone]   Fax:  [phone]  Occupational Therapy Treatment  Patient Details  Name: Kathleen Walls MRN: [id number] Date of Birth: 1940-09-20 Referring Provider: Rudene Christians  Encounter Date: 05/02/2016      OT End of Session - 05/02/16 1420    Visit Number 2   Number of Visits 6   Date for OT Re-Evaluation 05/17/16   OT Start Time 1219   OT Stop Time 1257   OT Time Calculation (min) 38 min   Activity Tolerance Patient tolerated treatment well   Behavior During Therapy [facility] for tasks assessed/performed      Past Medical History:  Diagnosis Date  . Arthritis   . Breast cancer [facility]) 2007    2009 breast cancer stage IIA T2 N0, ER positive, HER-2 negative, 2.5 cm primary tumor, poorly differentiated, status post wide local excision, one sentinel lymph node negative, high-risk Oncotype score 48  . Cataracts, both eyes   . Colitis   . Coronary artery disease   . Detached vitreous humor   . Diverticulitis   . 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 Gastritis   . GERD (gastroesophageal reflux disease)   . 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 Hemorrhoid   . Hemorrhoids   . Hyperlipemia   . Hypertension   . 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 Kidney cysts    Left  . Kidney stones   . Lipoma of shoulder 2009   left  . Migraine   . Osteoarthritis   . Osteopenia   . Stented coronary artery     Past Surgical History:  Procedure Laterality Date  . ABDOMINAL HYSTERECTOMY    . benign breast biopsy  2007  . BREAST BIOPSY Left 2007   positive  . BREAST CYST ASPIRATION Right    neg  . BREAST SURGERY    . CARDIAC CATHETERIZATION N/A 06/22/2015   Procedure: Left Heart Cath and Coronary Angiography;  Surgeon: Teodoro Spray, MD;  Location: [place] [facility];  Service: Cardiovascular;  Laterality: N/A;  .  CARDIAC CATHETERIZATION    . CATARACT EXTRACTION    . COLONOSCOPY    . CORONARY ANGIOPLASTY    . EYE SURGERY    . FOOT FRACTURE SURGERY Left   . PARTIAL HYSTERECTOMY    . UPPER GASTROINTESTINAL ENDOSCOPY      There were no vitals filed for this visit.      Subjective Assessment - 05/02/16 1412    Subjective  I could not wear the splint - made it feel worse I thought - but I did it some - still feeling pull over the top of my knuckle - this ring finger started bending while back and have this nodule on the  furthest  joint - that is tender - I try not to hit my middle knuckle on the shelves in cabinets  - not as tender in the palm I think - and no triggering    Patient Stated Goals Want the pain better - that I can use my hand to wring washcloth , make tight grip , vacuum, unpack dishwasher, open carton   Currently in Pain? Yes   Pain Score 3    Pain Location Finger (Comment which one)   Pain Orientation Right   Pain Descriptors / Indicators Aching   Aggravating Factors  making tight fist                      OT Treatments/Exercises (OP) - 05/02/16 0001      RUE Paraffin   Number Minutes Paraffin 10 Minutes   RUE Paraffin Location Hand   Comments At [facility] to decrease pain and increase ROM     Assess grip , prehension and AROM for digits flexion  AROM WNL , grip and lat grip same - 3 point increase with 2 lbs - not as tender over A1 pulley at 3rd , and also not over dorsal proximal phalanges   After Paraifin  Did Graston tools brushing , sweeping over palm , over A1 pulleys of all digits and  Volar digits - tightness over 5th and 2nd A1 pulleys , and proximal palm - but improve - and  Had less tightness with composite extention of digits and wrist   Composite extention PROM done 10 reps  Tendon glides - but only gentle AROM - not force Still cont with joint  Protection - use larger joints, enlarge handles and avoid tight and sustained grip  [place] at 20% , 3.3MHZ, and  1.0 intensity for 4 min over dorsal proximal phalanges of 3rd to decrease pain   Use  custom MC block splint for 3rd to  during composite tight or repetitive grip                OT Education - 05/02/16 1420    Education provided Yes   Education Details HEP   Person(s) Educated Patient;Spouse   Methods Explanation;Demonstration;Tactile cues;Verbal cues   Comprehension Verbal cues required;Returned demonstration;Verbalized understanding          OT Short Term Goals - 04/26/16 1130      OT SHORT TERM GOAL #1   Title Pt albe to make full fist with no increase pain or symptoms at 3rd digit   Baseline pain about 3/10    Time 2   Period Weeks   Status New     OT SHORT TERM GOAL #2   Title Pt verbalize 3 joint protection adaptations she is using during ADL's and IADL's to decrease symptoms    Baseline no knowledge on modifcations    Time 2   Period Weeks   Status New           OT Long Term Goals - 04/26/16 1132      OT LONG TERM GOAL #1   Title Quickdash score improve with 5-10 points    Baseline Quidkdash score 25    Time 3   Period Weeks   Status New     OT LONG TERM GOAL #2   Title Grip and prehension strenght in R hand improve with at least 3-5 lbs to use hand to wring washcloth without pain    Baseline Grip 25 R , L 28 ; Lat R7, L 9; 3pointg R 6 , L 7 - with ulnar deviation of 2nd and 3rd    Time 3   Period Weeks   Status New               Plan - 05/02/16 1421    Clinical Impression Statement Pt cont to have pain over dorsal 3rd MC during composite fist - show decrease extention on 4th at PIP and tender nodule on DIP - tenderness over 3rd A1pulley is better and 3 point grip on the R - pt to cont with same HEP to  decrease pain  - avoid tight or sustained grip ,  and bumping 3rd MC    Rehab Potential Good   OT Frequency 2x / week   OT Duration 4 weeks   OT Treatment/Interventions Self-care/ADL training;Contrast  Bath;Parrafin;Iontophoresis;Ultrasound;Splinting;Patient/family education;Therapeutic exercises;Manual Therapy   Plan assess how doing with HEP    OT Home Exercise Plan see pt instruction    Consulted and Agree with Plan of Care Patient      Patient will benefit from skilled therapeutic intervention in order to improve the following deficits and impairments:  Decreased knowledge of precautions, Pain, Impaired UE functional use, Decreased knowledge of use of DME, Decreased strength  Visit Diagnosis: Trigger middle finger, right  Pain in joint of right hand  Muscle weakness (generalized)    Problem List Patient Active Problem List   Diagnosis Date Noted  . Breast cancer, left breast ([place]) 05/20/2015  . Chest pain 05/14/2015  . Arteriosclerosis of coronary artery 07/22/2014  . Disorder of mitral valve 01/17/2014  . Absolute anemia 10/23/2013  . Benign hypertension 10/23/2013  . Dermatophytic onychia 10/23/2013  . HLD (hyperlipidemia) 10/23/2013  . Idiopathic peripheral neuropathy ([employer]) 10/23/2013    Markee Matera, Gwenette Greet 05/02/2016, 2:25 PM  [place] [facility] [address], [place], [address] Phone: [phone]   Fax:  [phone]  Name: Kathleen Walls MRN: [id number] Date of Birth: 03-Mar-1941

## 2016-05-04 ENCOUNTER — Ambulatory Visit: Payer: Medicare Other | Admitting: Occupational Therapy

## 2016-05-04 DIAGNOSIS — M25541 Pain in joints of right hand: Secondary | ICD-10-CM

## 2016-05-04 DIAGNOSIS — M65331 Trigger finger, right middle finger: Secondary | ICD-10-CM

## 2016-05-04 DIAGNOSIS — M6281 Muscle weakness (generalized): Secondary | ICD-10-CM

## 2016-05-04 NOTE — Therapy (Signed)
[place] [facility] [address] [place], [address] Phone: [phone]   Fax:  [phone]  Occupational Therapy Treatment  Patient Details  Name: Kathleen Walls MRN: [id number] Date of Birth: [birth year] Referring Provider: Rudene Christians  Encounter Date: 05/04/2016      OT End of Session - 05/04/16 0825    Visit Number 3   Number of Visits 6   Date for OT Re-Evaluation 05/17/16   OT Start Time 0810   OT Stop Time 0850   OT Time Calculation (min) 40 min   Activity Tolerance Patient tolerated treatment well   Behavior During Therapy [facility] for tasks assessed/performed      Past Medical History:  Diagnosis Date  . Arthritis   . Breast cancer [facility]) 2007    2009 breast cancer stage IIA T2 N0, ER positive, HER-2 negative, 2.5 cm primary tumor, poorly differentiated, status post wide local excision, one sentinel lymph node negative, high-risk Oncotype score 48  . Cataracts, both eyes   . Colitis   . Coronary artery disease   . Detached vitreous humor   . Diverticulitis   . 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 Gastritis   . GERD (gastroesophageal reflux disease)   . 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 Hemorrhoid   . Hemorrhoids   . Hyperlipemia   . Hypertension   . 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 Kidney cysts    Left  . Kidney stones   . Lipoma of shoulder 2009   left  . Migraine   . Osteoarthritis   . Osteopenia   . Stented coronary artery     Past Surgical History:  Procedure Laterality Date  . ABDOMINAL HYSTERECTOMY    . benign breast biopsy  2007  . BREAST BIOPSY Left 2007   positive  . BREAST CYST ASPIRATION Right    neg  . BREAST SURGERY    . CARDIAC CATHETERIZATION N/A 06/22/2015   Procedure: Left Heart Cath and Coronary Angiography;  Surgeon: Teodoro Spray, MD;  Location: [place] [facility];  Service: Cardiovascular;  Laterality: N/A;  .  CARDIAC CATHETERIZATION    . CATARACT EXTRACTION    . COLONOSCOPY    . CORONARY ANGIOPLASTY    . EYE SURGERY    . FOOT FRACTURE SURGERY Left   . PARTIAL HYSTERECTOMY    . UPPER GASTROINTESTINAL ENDOSCOPY      There were no vitals filed for this visit.      Subjective Assessment - 05/04/16 0824    Subjective  I think the pain is better - more stiffness - I think the knot  on the tip of finger even better today - still if I used it a lot still some pain    Patient Stated Goals Want the pain better - that I can use my hand to wring washcloth , make tight grip , vacuum, unpack dishwasher, open carton   Currently in Pain? Yes   Pain Score 2    Pain Orientation Right   Pain Descriptors / Indicators Aching                      OT Treatments/Exercises (OP) - 05/04/16 0001      RUE Paraffin   Number Minutes Paraffin 10 Minutes   RUE Paraffin Location Hand   Comments At [facility] to decrease pain and increase  ROM in 3rd digit and hand       After Paraifin  Tapping of digits - not increase pull - felt strain on 3rd - but no pain   Did Graston tools brushing , sweeping over palm , over A1 pulleys of all digits and  Volar digits - some tenderness ove  2nd A1 pulley this date    Digits extention tapping AROM done 5 reps  Tendon glides - but only gentle AROM - not force  Fabricated new MC block splint for 3rd - with splint wrap around volar aspect of 3rd proximal phalanges , and over 2nd prox phalanges - blocking composite fist - not cauring irriation to dorsal 3rd prox phalanges Still cont with joint  Protection - use larger joints, enlarge handles and avoid tight and sustained grip  [place] at 20% , 3.3MHZ, and 1.0 intensity for 5 min over dorsal proximal phalanges of 3rd  And 3rd A1 pulley to decrease pain   Use  custom MC block splint for 3rd to  during composite tight or repetitive grip - alternate between 2 - see what one helps the most              OT Education -  05/04/16 0825    Education provided Yes   Education Details HEP    Person(s) Educated Patient   Methods Demonstration;Explanation;Tactile cues   Comprehension Verbalized understanding;Returned demonstration          OT Short Term Goals - 04/26/16 1130      OT SHORT TERM GOAL #1   Title Pt albe to make full fist with no increase pain or symptoms at 3rd digit   Baseline pain about 3/10    Time 2   Period Weeks   Status New     OT SHORT TERM GOAL #2   Title Pt verbalize 3 joint protection adaptations she is using during ADL's and IADL's to decrease symptoms    Baseline no knowledge on modifcations    Time 2   Period Weeks   Status New           OT Long Term Goals - 04/26/16 1132      OT LONG TERM GOAL #1   Title Quickdash score improve with 5-10 points    Baseline Quidkdash score 25    Time 3   Period Weeks   Status New     OT LONG TERM GOAL #2   Title Grip and prehension strenght in R hand improve with at least 3-5 lbs to use hand to wring washcloth without pain    Baseline Grip 25 R , L 28 ; Lat R7, L 9; 3pointg R 6 , L 7 - with ulnar deviation of 2nd and 3rd    Time 3   Period Weeks   Status New               Plan - 05/04/16 1818    Clinical Impression Statement Pt pain in R 3rd MC improved but if she used her hand a while - pain still occur and if she put on MC block splint - pain better - was little tender over A1pulley at 3rd - did change her MC block splint and to use it before pain starts - and reinforce importance to use large joints , and avoid tight and sustained grips   Rehab Potential Good   OT Frequency 2x / week   OT Duration 2 weeks   OT Treatment/Interventions Self-care/ADL training;Contrast Bath;Parrafin;Iontophoresis;Ultrasound;Splinting;Patient/family  education;Therapeutic exercises;Manual Therapy   Plan assess how doing with splint use ,pain ?    OT Home Exercise Plan see pt instruction    Consulted and Agree with Plan of Care Patient       Patient will benefit from skilled therapeutic intervention in order to improve the following deficits and impairments:  Decreased knowledge of precautions, Pain, Impaired UE functional use, Decreased knowledge of use of DME, Decreased strength  Visit Diagnosis: Trigger middle finger, right  Pain in joint of right hand  Muscle weakness (generalized)    Problem List Patient Active Problem List   Diagnosis Date Noted  . Breast cancer, left breast ([place]) 05/20/2015  . Chest pain 05/14/2015  . Arteriosclerosis of coronary artery 07/22/2014  . Disorder of mitral valve 01/17/2014  . Absolute anemia 10/23/2013  . Benign hypertension 10/23/2013  . Dermatophytic onychia 10/23/2013  . HLD (hyperlipidemia) 10/23/2013  . Idiopathic peripheral neuropathy ([place]) 10/23/2013    Rosalyn Gess OTR/L,CLT 05/04/2016, 6:27 PM  [place] [facility] [address] [place], [address] Phone: [phone]   Fax:  [phone]  Name: CAMRON ESSMAN MRN: [id number] Date of Birth: 1940-10-10

## 2016-05-04 NOTE — Patient Instructions (Addendum)
Cont with contrast    Digits extention tapping AROM done 5 reps  Tendon glides - but only gentle AROM - not force   Still cont with joint  Protection - use larger joints, enlarge handles and avoid tight and sustained grip  Use  custom MC block splint for 3rd to  during composite tight or repetitive grip - alternate between 2 - see what one helps the most

## 2016-05-08 ENCOUNTER — Ambulatory Visit: Payer: Medicare Other | Admitting: Occupational Therapy

## 2016-05-08 DIAGNOSIS — M65331 Trigger finger, right middle finger: Secondary | ICD-10-CM

## 2016-05-08 DIAGNOSIS — M6281 Muscle weakness (generalized): Secondary | ICD-10-CM

## 2016-05-08 DIAGNOSIS — M25541 Pain in joints of right hand: Secondary | ICD-10-CM

## 2016-05-08 NOTE — Therapy (Signed)
[facility] [address] [place], [address] Phone: [phone]   Fax:  [phone]  Occupational Therapy Treatment  Patient Details  Name: Kathleen Walls MRN: [id number] Date of Birth: [birth year] Referring Provider: Rudene Christians  Encounter Date: 05/08/2016      OT End of Session - 05/08/16 1002    Visit Number 4   Number of Visits 6   Date for OT Re-Evaluation 05/17/16   OT Start Time 0947   OT Stop Time 1030   OT Time Calculation (min) 43 min   Activity Tolerance Patient tolerated treatment well   Behavior During Therapy [facility] for tasks assessed/performed      Past Medical History:  Diagnosis Date  . Arthritis   . Breast cancer [facility]) 2007    2009 breast cancer stage IIA T2 N0, ER positive, HER-2 negative, 2.5 cm primary tumor, poorly differentiated, status post wide local excision, one sentinel lymph node negative, high-risk Oncotype score 48  . Cataracts, both eyes   . Colitis   . Coronary artery disease   . Detached vitreous humor   . Diverticulitis   . 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 Gastritis   . GERD (gastroesophageal reflux disease)   . 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 Hemorrhoid   . Hemorrhoids   . Hyperlipemia   . Hypertension   . 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 Kidney cysts    Left  . Kidney stones   . Lipoma of shoulder 2009   left  . Migraine   . Osteoarthritis   . Osteopenia   . Stented coronary artery     Past Surgical History:  Procedure Laterality Date  . ABDOMINAL HYSTERECTOMY    . benign breast biopsy  2007  . BREAST BIOPSY Left 2007   positive  . BREAST CYST ASPIRATION Right    neg  . BREAST SURGERY    . CARDIAC CATHETERIZATION N/A 06/22/2015   Procedure: Left Heart Cath and Coronary Angiography;  Surgeon: Teodoro Spray, MD;  Location: [facility];  Service: Cardiovascular;  Laterality: N/A;  .  CARDIAC CATHETERIZATION    . CATARACT EXTRACTION    . COLONOSCOPY    . CORONARY ANGIOPLASTY    . EYE SURGERY    . FOOT FRACTURE SURGERY Left   . PARTIAL HYSTERECTOMY    . UPPER GASTROINTESTINAL ENDOSCOPY      There were no vitals filed for this visit.      Subjective Assessment - 05/08/16 1001    Subjective  Pain is better - I like the splint you made first for me - I did my exercises more since last time - so maybe that helped-    Patient Stated Goals Want the pain better - that I can use my hand to wring washcloth , make tight grip , vacuum, unpack dishwasher, open carton   Currently in Pain? No/denies            [facility] OT Assessment - 05/08/16 0001      Strength   Right Hand Grip (lbs) 25   Right Hand Lateral Pinch 10 lbs   Right Hand 3 Point Pinch 7 lbs   Left Hand Grip (lbs) 28   Left Hand Lateral Pinch 10 lbs   Left Hand 3 Point Pinch 7 lbs  OT Treatments/Exercises (OP) - 05/08/16 0001      RUE Paraffin   Number Minutes Paraffin 10 Minutes   RUE Paraffin Location Hand   Comments at [facility] to decrease pain       After Paraifin  Tapping of digits - no pain - and pull   Did Graston tools brushing , sweeping over palm , over A1 pulleys of all digits and Volar digits - no tenderness this date    Digits extention tapping AROM done 5 reps  Tendon glides - but only gentle AROM - not force - no pull this date and had full 90 degrees MC flexion with PIP extention - before could not isolate 90 degrees flexion  Pt to cont with wearing of  MC block splint for 3rd - blocking composite fist  Still cont with joint Protection - use larger joints, enlarge handles and avoid tight and sustained grip  [place] at 20% , 3.3MHZ, and 1.0 intensity for 5 min over dorsal proximal phalanges of 3rd  And 3rd A1 pulley to decrease pain              OT Education - 05/08/16 1002    Education provided Yes   Education Details HEP update    Person(s)  Educated Patient   Methods Explanation;Demonstration;Tactile cues;Verbal cues   Comprehension Verbalized understanding;Returned demonstration;Verbal cues required          OT Short Term Goals - 04/26/16 1130      OT SHORT TERM GOAL #1   Title Pt albe to make full fist with no increase pain or symptoms at 3rd digit   Baseline pain about 3/10    Time 2   Period Weeks   Status New     OT SHORT TERM GOAL #2   Title Pt verbalize 3 joint protection adaptations she is using during ADL's and IADL's to decrease symptoms    Baseline no knowledge on modifcations    Time 2   Period Weeks   Status New           OT Long Term Goals - 04/26/16 1132      OT LONG TERM GOAL #1   Title Quickdash score improve with 5-10 points    Baseline Quidkdash score 25    Time 3   Period Weeks   Status New     OT LONG TERM GOAL #2   Title Grip and prehension strenght in R hand improve with at least 3-5 lbs to use hand to wring washcloth without pain    Baseline Grip 25 R , L 28 ; Lat R7, L 9; 3pointg R 6 , L 7 - with ulnar deviation of 2nd and 3rd    Time 3   Period Weeks   Status New               Plan - 05/08/16 1003    Clinical Impression Statement Pt show decrease pain and tenderness over dorsal 3rd MC and proximal phalanges - and A1pulley at 3rd - pt showed increase lat and 3 point grip in R hand -  pt using 3rd MC block splint more but still using hands a lot - pt to cont with HEP and joint protection    Rehab Potential Good   OT Frequency 2x / week   OT Duration 2 weeks   OT Treatment/Interventions Self-care/ADL training;Contrast Bath;Parrafin;Iontophoresis;Ultrasound;Splinting;Patient/family education;Therapeutic exercises;Manual Therapy   OT Home Exercise Plan see pt instruction    Consulted and Agree with Plan  of Care Patient      Patient will benefit from skilled therapeutic intervention in order to improve the following deficits and impairments:  Decreased knowledge of  precautions, Pain, Impaired UE functional use, Decreased knowledge of use of DME, Decreased strength  Visit Diagnosis: Trigger middle finger, right  Pain in joint of right hand  Muscle weakness (generalized)    Problem List Patient Active Problem List   Diagnosis Date Noted  . Breast cancer, left breast ([place]) 05/20/2015  . Chest pain 05/14/2015  . Arteriosclerosis of coronary artery 07/22/2014  . Disorder of mitral valve 01/17/2014  . Absolute anemia 10/23/2013  . Benign hypertension 10/23/2013  . Dermatophytic onychia 10/23/2013  . HLD (hyperlipidemia) 10/23/2013  . Idiopathic peripheral neuropathy ([place]) 10/23/2013    Rosalyn Gess OTR/L,CLT  05/08/2016, 10:51 AM  [facility] [address] [place], [address] Phone: [phone]   Fax:  [phone]  Name: LARAY RIVKIN MRN: [id number] Date of Birth: 18-Jul-1941

## 2016-05-08 NOTE — Patient Instructions (Addendum)
   Digits extention tapping AROM done 5 reps  Tendon glides - but only gentle AROM - not force   Pt to cont with wearing of  MC block splint for 3rd - blocking composite fist  Still cont with joint Protection - use larger joints, enlarge handles and avoid tight and sustained grip

## 2016-05-11 ENCOUNTER — Ambulatory Visit: Payer: Medicare Other | Admitting: Occupational Therapy

## 2016-05-11 DIAGNOSIS — M65331 Trigger finger, right middle finger: Secondary | ICD-10-CM | POA: Diagnosis not present

## 2016-05-11 DIAGNOSIS — M25541 Pain in joints of right hand: Secondary | ICD-10-CM

## 2016-05-11 DIAGNOSIS — M6281 Muscle weakness (generalized): Secondary | ICD-10-CM

## 2016-05-11 NOTE — Patient Instructions (Addendum)
Contrast  Digits extention tapping Tendon glides -  not force - MC 90 and PIP 100   Pt to cont with wearing of  MC block splint for 3rd - blocking composite fist  Still cont with joint Protection - use larger joints, enlarge handles and avoid tight and sustained grip - reinforce for pt to cont 3-4 more wks

## 2016-05-11 NOTE — Therapy (Signed)
[facility] [address], [place], [address] Phone: [phone]   Fax:  [phone]  Occupational Therapy Treatment  Patient Details  Name: Kathleen Walls MRN: [id number] Date of Birth: 1940-11-07 Referring Provider: Rudene Christians  Encounter Date: 05/11/2016      OT End of Session - 05/11/16 1418    Visit Number 5   Number of Visits 6   Date for OT Re-Evaluation 05/17/16   OT Start Time 0950   OT Stop Time 1030   OT Time Calculation (min) 40 min   Activity Tolerance Patient tolerated treatment well   Behavior During Therapy [facility] for tasks assessed/performed      Past Medical History:  Diagnosis Date  . Arthritis   . Breast cancer [facility]) 2007    2009 breast cancer stage IIA T2 N0, ER positive, HER-2 negative, 2.5 cm primary tumor, poorly differentiated, status post wide local excision, one sentinel lymph node negative, high-risk Oncotype score 48  . Cataracts, both eyes   . Colitis   . Coronary artery disease   . Detached vitreous humor   . Diverticulitis   . 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 Gastritis   . GERD (gastroesophageal reflux disease)   . 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 Hemorrhoid   . Hemorrhoids   . Hyperlipemia   . Hypertension   . 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 Kidney cysts    Left  . Kidney stones   . Lipoma of shoulder 2009   left  . Migraine   . Osteoarthritis   . Osteopenia   . Stented coronary artery     Past Surgical History:  Procedure Laterality Date  . ABDOMINAL HYSTERECTOMY    . benign breast biopsy  2007  . BREAST BIOPSY Left 2007   positive  . BREAST CYST ASPIRATION Right    neg  . BREAST SURGERY    . CARDIAC CATHETERIZATION N/A 06/22/2015   Procedure: Left Heart Cath and Coronary Angiography;  Surgeon: Teodoro Spray, MD;  Location: [facility];  Service: Cardiovascular;  Laterality: N/A;  .  CARDIAC CATHETERIZATION    . CATARACT EXTRACTION    . COLONOSCOPY    . CORONARY ANGIOPLASTY    . EYE SURGERY    . FOOT FRACTURE SURGERY Left   . PARTIAL HYSTERECTOMY    . UPPER GASTROINTESTINAL ENDOSCOPY      There were no vitals filed for this visit.      Subjective Assessment - 05/11/16 0944    Subjective  Pain is better - here and there I feel it over the knuckle but also did feel it in L - did  a lot of packing RV - were leaving Sat - no tenderness in palm - and like to use little splint - trying to grip and pick up objects like you told me - I just do not like to sit still - I am always busy    Patient Stated Goals Want the pain better - that I can use my hand to wring washcloth , make tight grip , vacuum, unpack dishwasher, open carton   Currently in Pain? Yes   Pain Score 1    Pain Location Finger (Comment which one)   Pain Orientation Right   Pain Descriptors / Indicators Sore   Pain Frequency Occasional  [facility] OT Assessment - 05/11/16 0001      Strength   Right Hand Grip (lbs) 30   Right Hand Lateral Pinch 9 lbs   Right Hand 3 Point Pinch 7 lbs   Left Hand Grip (lbs) 35   Left Hand Lateral Pinch 11 lbs   Left Hand 3 Point Pinch 7 lbs     Right Hand AROM   R Long  MCP 0-90 90 Degrees   R Long PIP 0-100 100 Degrees                  OT Treatments/Exercises (OP) - 05/11/16 0001      RUE Paraffin   Number Minutes Paraffin 10 Minutes   RUE Paraffin Location Hand   Comments At [facility] to decrease pain       No pain over A1pulley at 3rd - and very little tenderness over 3rd [facility] /proximal phalanges No pain with tapping - only little strain [place] done  After Paraifin  Did Graston tools brushing , sweeping over palm , over A1 pulleys of all digits and Volar digits - no tenderness this date  Was with PROM behind 4th PIP for extention  AROM pushing into table - can get full extention   Digits extention tapping AROM done 5 reps  Tendon  glides -  not force - MC 90 and PIP 100   Pt to cont with wearing of  MC block splint for 3rd - blocking composite fist  Still cont with joint Protection - use larger joints, enlarge handles and avoid tight and sustained grip - reinforce for pt to cont 3-4 more wks  [place] at 20% , 3.3MHZ, and 1.0 intensity for 5 min over dorsal proximal phalanges of 3rd And 3rd A1 pulley to decrease pain              OT Education - 05/11/16 1418    Education provided Yes   Education Details discharge instruction and HEP    Person(s) Educated Patient;Spouse   Methods Explanation;Demonstration;Tactile cues;Verbal cues   Comprehension Verbal cues required;Returned demonstration;Verbalized understanding          OT Short Term Goals - 05/11/16 1420      OT SHORT TERM GOAL #1   Title Pt albe to make full fist with no increase pain or symptoms at 3rd digit   Baseline occasional slight pull 1/10    Status Partially Met     OT SHORT TERM GOAL #2   Title Pt verbalize 3 joint protection adaptations she is using during ADL's and IADL's to decrease symptoms    Status Achieved           OT Long Term Goals - 05/11/16 1420      OT LONG TERM GOAL #1   Title Quickdash score improve with 5-10 points    Baseline Quidkdash score 25 at eval - and now 13/50   Status Achieved     OT LONG TERM GOAL #2   Title Grip and prehension strenght in R hand improve with at least 3-5 lbs to use hand to wring washcloth without pain    Baseline see flowsheet - improve 10 lbs on R for grip    Status Achieved               Plan - 05/11/16 1419    Rehab Potential Good   OT Frequency Monthly   OT Duration Other (comment)   OT Treatment/Interventions Self-care/ADL training;Contrast Bath;Parrafin;Iontophoresis;Ultrasound;Splinting;Patient/family education;Therapeutic exercises;Manual Therapy  Plan pt out of town for month - pt to [place] if need to check on    [place] see pt instruction     Consulted and Agree with Plan of Care Patient      Patient will benefit from skilled therapeutic intervention in order to improve the following deficits and impairments:  Decreased knowledge of precautions, Pain, Impaired UE functional use, Decreased knowledge of use of DME, Decreased strength  Visit Diagnosis: Trigger middle finger, right  Pain in joint of right hand  Muscle weakness (generalized)    Problem List Patient Active Problem List   Diagnosis Date Noted  . Breast cancer, left breast ([place]) 05/20/2015  . Chest pain 05/14/2015  . Arteriosclerosis of coronary artery 07/22/2014  . Disorder of mitral valve 01/17/2014  . Absolute anemia 10/23/2013  . Benign hypertension 10/23/2013  . Dermatophytic onychia 10/23/2013  . HLD (hyperlipidemia) 10/23/2013  . Idiopathic peripheral neuropathy ([place]) 10/23/2013    Rosalyn Gess OTR/L,CLT 05/11/2016, 2:24 PM  [facility] [address], [place], [address] Phone: [phone]   Fax:  [phone]  Name: Kathleen Walls MRN: [id number] Date of Birth: [birth year]

## 2016-06-19 ENCOUNTER — Other Ambulatory Visit: Payer: Self-pay | Admitting: Internal Medicine

## 2016-07-14 ENCOUNTER — Other Ambulatory Visit: Payer: Self-pay | Admitting: Internal Medicine

## 2016-07-14 DIAGNOSIS — Z1231 Encounter for screening mammogram for malignant neoplasm of breast: Secondary | ICD-10-CM

## 2016-07-26 ENCOUNTER — Ambulatory Visit
Admission: RE | Admit: 2016-07-26 | Discharge: 2016-07-26 | Disposition: A | Discharge status: Home / Self Care | Payer: Medicare Other | Source: Ambulatory Visit | Attending: Internal Medicine | Admitting: Internal Medicine

## 2016-07-26 ENCOUNTER — Encounter: Payer: Self-pay | Admitting: Emergency Medicine

## 2016-07-26 ENCOUNTER — Observation Stay
Admission: EM | Admit: 2016-07-26 | Discharge: 2016-07-29 | Disposition: A | Discharge status: Home / Self Care | Payer: Medicare Other | Attending: Surgery | Admitting: Surgery

## 2016-07-26 ENCOUNTER — Other Ambulatory Visit: Payer: Self-pay | Admitting: Internal Medicine

## 2016-07-26 DIAGNOSIS — N8110 Cystocele, unspecified: Secondary | ICD-10-CM | POA: Insufficient documentation

## 2016-07-26 DIAGNOSIS — I10 Essential (primary) hypertension: Secondary | ICD-10-CM | POA: Diagnosis not present

## 2016-07-26 DIAGNOSIS — Z87891 Personal history of nicotine dependence: Secondary | ICD-10-CM | POA: Diagnosis not present

## 2016-07-26 DIAGNOSIS — Z881 Allergy status to other antibiotic agents status: Secondary | ICD-10-CM | POA: Diagnosis not present

## 2016-07-26 DIAGNOSIS — K219 Gastro-esophageal reflux disease without esophagitis: Secondary | ICD-10-CM | POA: Insufficient documentation

## 2016-07-26 DIAGNOSIS — R1031 Right lower quadrant pain: Secondary | ICD-10-CM

## 2016-07-26 DIAGNOSIS — K3580 Unspecified acute appendicitis: Principal | ICD-10-CM

## 2016-07-26 DIAGNOSIS — Z7982 Long term (current) use of aspirin: Secondary | ICD-10-CM | POA: Insufficient documentation

## 2016-07-26 DIAGNOSIS — Z923 Personal history of irradiation: Secondary | ICD-10-CM | POA: Diagnosis not present

## 2016-07-26 DIAGNOSIS — I2510 Atherosclerotic heart disease of native coronary artery without angina pectoris: Secondary | ICD-10-CM | POA: Insufficient documentation

## 2016-07-26 DIAGNOSIS — M8580 Other specified disorders of bone density and structure, unspecified site: Secondary | ICD-10-CM | POA: Insufficient documentation

## 2016-07-26 DIAGNOSIS — Z9221 Personal history of antineoplastic chemotherapy: Secondary | ICD-10-CM | POA: Insufficient documentation

## 2016-07-26 DIAGNOSIS — M1990 Unspecified osteoarthritis, unspecified site: Secondary | ICD-10-CM | POA: Diagnosis not present

## 2016-07-26 DIAGNOSIS — Z888 Allergy status to other drugs, medicaments and biological substances status: Secondary | ICD-10-CM | POA: Insufficient documentation

## 2016-07-26 DIAGNOSIS — Z853 Personal history of malignant neoplasm of breast: Secondary | ICD-10-CM | POA: Diagnosis not present

## 2016-07-26 DIAGNOSIS — Z884 Allergy status to anesthetic agent status: Secondary | ICD-10-CM | POA: Diagnosis not present

## 2016-07-26 DIAGNOSIS — D72829 Elevated white blood cell count, unspecified: Secondary | ICD-10-CM

## 2016-07-26 DIAGNOSIS — I700 Atherosclerosis of aorta: Secondary | ICD-10-CM | POA: Diagnosis not present

## 2016-07-26 DIAGNOSIS — Z87442 Personal history of urinary calculi: Secondary | ICD-10-CM | POA: Insufficient documentation

## 2016-07-26 DIAGNOSIS — K5730 Diverticulosis of large intestine without perforation or abscess without bleeding: Secondary | ICD-10-CM | POA: Diagnosis not present

## 2016-07-26 DIAGNOSIS — E785 Hyperlipidemia, unspecified: Secondary | ICD-10-CM | POA: Diagnosis not present

## 2016-07-26 DIAGNOSIS — Z955 Presence of coronary angioplasty implant and graft: Secondary | ICD-10-CM | POA: Diagnosis not present

## 2016-07-26 LAB — CBC
HCT: 36.9 % (ref 35.0–47.0)
HEMOGLOBIN: 12.1 g/dL (ref 12.0–16.0)
MCH: 26.5 pg (ref 26.0–34.0)
MCHC: 32.7 g/dL (ref 32.0–36.0)
MCV: 80.9 fL (ref 80.0–100.0)
Platelets: 240 10*3/uL (ref 150–440)
RBC: 4.56 MIL/uL (ref 3.80–5.20)
RDW: 14.9 % — ABNORMAL HIGH (ref 11.5–14.5)
WBC: 11.8 10*3/uL — ABNORMAL HIGH (ref 3.6–11.0)

## 2016-07-26 LAB — CREATININE, SERUM
CREATININE: 0.75 mg/dL (ref 0.44–1.00)
GFR calc Af Amer: >60 mL/min (ref >60)
GFR calc non Af Amer: >60 mL/min (ref >60)

## 2016-07-26 MED ORDER — ACETAMINOPHEN 325 MG PO TABS: 650.0000 mg | ORAL_TABLET | Freq: Four times a day (QID) | ORAL | Status: DC | PRN

## 2016-07-26 MED ORDER — ONDANSETRON 4 MG PO TBDP
4.0000 mg | ORAL_TABLET | Freq: Four times a day (QID) | ORAL | Status: DC | PRN
  Administered 2016-07-28: 4 mg via ORAL
  Filled 2016-07-26: qty 1

## 2016-07-26 MED ORDER — MORPHINE SULFATE (PF) 4 MG/ML IV SOLN: 4.0000 mg | INTRAVENOUS | Status: DC | PRN

## 2016-07-26 MED ORDER — KCL IN DEXTROSE-NACL 20-5-0.45 MEQ/L-%-% IV SOLN
INTRAVENOUS | Status: DC
  Administered 2016-07-27: via INTRAVENOUS
  Filled 2016-07-26 (×4): qty 1000

## 2016-07-26 MED ORDER — ASPIRIN EC 81 MG PO TBEC
81.0000 mg | DELAYED_RELEASE_TABLET | Freq: Every day | ORAL | Status: DC
  Administered 2016-07-27 – 2016-07-29 (×3): 81 mg via ORAL
  Filled 2016-07-26 (×3): qty 1

## 2016-07-26 MED ORDER — EZETIMIBE 10 MG PO TABS
10.0000 mg | ORAL_TABLET | Freq: Every day | ORAL | Status: DC
  Administered 2016-07-27 – 2016-07-29 (×3): 10 mg via ORAL
  Filled 2016-07-26 (×3): qty 1

## 2016-07-26 MED ORDER — ACETAMINOPHEN 650 MG RE SUPP: 650.0000 mg | Freq: Four times a day (QID) | RECTAL | Status: DC | PRN

## 2016-07-26 MED ORDER — HYDROCODONE-ACETAMINOPHEN 5-325 MG PO TABS: 1.0000 | ORAL_TABLET | ORAL | Status: DC | PRN

## 2016-07-26 MED ORDER — ALPRAZOLAM 0.25 MG PO TABS
0.1250 mg | ORAL_TABLET | Freq: Every day | ORAL | Status: DC
  Administered 2016-07-27 – 2016-07-28 (×3): 0.125 mg via ORAL
  Filled 2016-07-26 (×3): qty 1

## 2016-07-26 MED ORDER — PIPERACILLIN-TAZOBACTAM 3.375 G IVPB
3.3750 g | Freq: Three times a day (TID) | INTRAVENOUS | Status: DC
  Administered 2016-07-27 – 2016-07-29 (×8): 3.375 g via INTRAVENOUS
  Filled 2016-07-26 (×8): qty 50

## 2016-07-26 MED ORDER — CEFOXITIN SODIUM 2 G IV SOLR
2.0000 g | Freq: Once | INTRAVENOUS | Status: DC
  Filled 2016-07-26: qty 2

## 2016-07-26 MED ORDER — GABAPENTIN 300 MG PO CAPS
300.0000 mg | ORAL_CAPSULE | Freq: Three times a day (TID) | ORAL | Status: DC
  Administered 2016-07-27 – 2016-07-29 (×6): 300 mg via ORAL
  Filled 2016-07-26 (×6): qty 1

## 2016-07-26 MED ORDER — ENOXAPARIN SODIUM 40 MG/0.4ML ~~LOC~~ SOLN
40.0000 mg | SUBCUTANEOUS | Status: DC
  Administered 2016-07-26 – 2016-07-28 (×3): 40 mg via SUBCUTANEOUS
  Filled 2016-07-26 (×3): qty 0.4

## 2016-07-26 MED ORDER — PANTOPRAZOLE SODIUM 40 MG IV SOLR
40.0000 mg | Freq: Every day | INTRAVENOUS | Status: DC
  Administered 2016-07-26: 40 mg via INTRAVENOUS
  Filled 2016-07-26 (×2): qty 40

## 2016-07-26 MED ORDER — IOPAMIDOL (ISOVUE-300) INJECTION 61%
75.0000 mL | Freq: Once | INTRAVENOUS | Status: AC | PRN
  Administered 2016-07-26: 75 mL via INTRAVENOUS

## 2016-07-26 MED ORDER — METOPROLOL SUCCINATE ER 50 MG PO TB24
25.0000 mg | ORAL_TABLET | Freq: Every day | ORAL | Status: DC
  Administered 2016-07-27 – 2016-07-29 (×3): 25 mg via ORAL
  Filled 2016-07-26 (×3): qty 1

## 2016-07-26 MED ORDER — ONDANSETRON HCL 4 MG/2ML IJ SOLN
4.0000 mg | Freq: Four times a day (QID) | INTRAMUSCULAR | Status: DC | PRN
  Administered 2016-07-27: 4 mg via INTRAVENOUS
  Filled 2016-07-26: qty 2

## 2016-07-26 NOTE — H&P (Signed)
Kathleen Walls is a 75 y.o. female  with right lower quadrant abdominal pain.  HPI: She was in her usual state of good health until approximately 48 hours ago when she developed generalized abdominal pain. She had some mild anorexia but no nausea or vomiting. Her symptoms persisted with intermittent abdominal pain both right and left lower quadrants but today localized to the right lower quadrant. She presented to her primary care physician for further evaluation. Workup revealed slightly elevated white blood cell count of 16,000 and CT scan revealed a dilated appendix at the base with no appendicolith and some mild surrounding fluid. The question of early appendicitis was raised and the patient referred to the emergency room for further evaluation.  The patient has no previous similar symptoms. She does have history of diverticulitis on some abdominal discomfort in the past but she's not had any surgical intervention for that problem. She denies any history of hepatitis yellow jaundice pancreatitis peptic ulcer disease. She's never had any gallbladder problems that she is aware of. She has had hysterectomy in the past. She is multiple medical problems including breast cancer requiring lumpectomy radiation and chemotherapy. She's had coronary artery disease with stent placed in 2016. She is regularly followed by both cardiology and her internal medicine service. 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  She denies any fever or chills. She has been mildly constipated recently. The surgical service was consulted for possible early appendicitis.  Past Medical History:  Diagnosis Date  . Arthritis   . Breast cancer [facility]) 2007    2009 breast cancer stage IIA T2 N0, ER positive, HER-2 negative, 2.5 cm primary tumor, poorly differentiated, status post wide local excision, one sentinel lymph node negative, high-risk Oncotype score 48  . Cataracts, both eyes   . Colitis   . Coronary artery  disease   . Detached vitreous humor   . Diverticulitis   . 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 Gastritis   . GERD (gastroesophageal reflux disease)   . 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 Hemorrhoid   . Hemorrhoids   . Hyperlipemia   . Hypertension   . 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 Kidney cysts    Left  . Kidney stones   . Lipoma of shoulder 2009   left  . Migraine   . Osteoarthritis   . Osteopenia   . Stented coronary artery    Past Surgical History:  Procedure Laterality Date  . ABDOMINAL HYSTERECTOMY    . benign breast biopsy  2007  . BREAST BIOPSY Left 2007   positive  . BREAST CYST ASPIRATION Right    neg  . BREAST SURGERY    . CARDIAC CATHETERIZATION N/A 06/22/2015   Procedure: Left Heart Cath and Coronary Angiography;  Surgeon: Teodoro Spray, MD;  Location: [facility];  Service: Cardiovascular;  Laterality: N/A;  . CARDIAC CATHETERIZATION    . CATARACT EXTRACTION    . COLONOSCOPY    . CORONARY ANGIOPLASTY    . EYE SURGERY    . FOOT FRACTURE SURGERY Left   . PARTIAL HYSTERECTOMY    . UPPER GASTROINTESTINAL ENDOSCOPY     Social History   Social History  . Marital status: Married    Spouse name: N/A  . Number of children: N/A  . Years of education: N/A   Social History Main Topics  . Smoking status: Former Smoker  Types: Cigarettes    Quit date: 08/14/1993  . Smokeless tobacco: Never Used  . Alcohol use No  . Drug use: No  . Sexual activity: Yes    Birth control/ protection: None, Post-menopausal   Other Topics Concern  . None   Social History Narrative  . None     Review of Systems  Constitutional: Negative for chills, diaphoresis and fever.  HENT: Negative.   Eyes: Negative.   Respiratory: Negative for cough, sputum production, shortness of breath and wheezing.   Cardiovascular: Negative for chest pain, palpitations and orthopnea.  Gastrointestinal: Positive for abdominal  pain and constipation. Negative for diarrhea, heartburn, nausea and vomiting.  Genitourinary: Negative.   Musculoskeletal: Negative.   Skin: Negative.   Neurological: Negative for weakness.       Neuropathy in her hands and feet.  Psychiatric/Behavioral: Negative.       PHYSICAL EXAM: BP (!) 141/70 (BP Location: Right Arm)   Pulse 67   Temp 98 F (36.7 C) (Oral)   Resp 16   Ht '5\' 4"'$  (1.626 m)   Wt 41.7 kg (92 lb)   SpO2 99%   BMI 15.79 kg/m   Physical Exam  Constitutional: She is oriented to person, place, and time. She appears well-developed and well-nourished. No distress.  HENT:  Head: Normocephalic and atraumatic.  Eyes: EOM are normal. Pupils are equal, round, and reactive to light.  Neck: Normal range of motion. Neck supple.  Cardiovascular: Normal rate, regular rhythm and normal heart sounds.   No murmur heard. Pulmonary/Chest: Effort normal and breath sounds normal. No respiratory distress. She has no wheezes.  Abdominal: Soft. Bowel sounds are normal. She exhibits no distension and no mass. There is tenderness. There is guarding. There is no rebound.  Musculoskeletal: Normal range of motion. She exhibits no edema or deformity.  Neurological: She is alert and oriented to person, place, and time.  Skin: Skin is warm and dry. She is not diaphoretic.  Psychiatric: Her behavior is normal. Thought content normal.   Her abdomen is moderately tender in right lower quadrant. She has guarding but no rebound. She has active bowel sounds.  Impression/Plan: I have independently reviewed her CT scan. She does have some mild dilatation of the base of the appendix had almost 11 mm. There is some thickness in the base. There is no significant surrounding fluid free air or an appendicolith. She has no fever and her symptoms are minimal at the present time.  We discussed the options available to her. In this setting with no appendicolith and controllable symptoms of equal started on  antibiotic therapy and observe her for the next 12-24 hours. I discussed risk of perforation and the need for surgical intervention. The patient and her family are comfortable with this approach are in agreement with our plan. We will recheck her white blood cell count in the morning.   Dia Crawford III, MD  07/26/2016, 10:15 PM

## 2016-07-26 NOTE — ED Triage Notes (Signed)
Pt sent over from outpatient CT via Dr. Doy Hutching due to appendicitis.  Pt with complaints of RLQ radiating into LLQ since yesterday.  Pt denies any N/V/D.  Pt A/Ox4, vitals WDL, no immediate distress noted at this time.

## 2016-07-26 NOTE — ED Provider Notes (Signed)
[facility] Emergency Department Provider Note  ____________________________________________   First MD Initiated Contact with Patient 07/26/16 2104     (approximate)  I have reviewed the triage vital signs and the nursing notes.   HISTORY  Chief Complaint Abdominal Pain   HPI Kathleen Walls is a 75 y.o. female with a history of coronary artery disease on a daily aspirin who is presenting to the emergency department with 2 days of right lower quadrant abdominal pain. She says that the pain is intermittent and most that time she is just aware of the pain but is not in any sort of distress or severe discomfort. She denies any nausea vomiting or diarrhea. She was seen as an outpatient earlier today where she had blood work and a CAT scan which showed an early appendicitis. She was then told to come to the emergency department for further evaluation and likely admission and surgery.   Past Medical History:  Diagnosis Date  . Arthritis   . Breast cancer [facility]) 2007    2009 breast cancer stage IIA T2 N0, ER positive, HER-2 negative, 2.5 cm primary tumor, poorly differentiated, status post wide local excision, one sentinel lymph node negative, high-risk Oncotype score 48  . Cataracts, both eyes   . Colitis   . Coronary artery disease   . Detached vitreous humor   . Diverticulitis   . 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 Gastritis   . GERD (gastroesophageal reflux disease)   . 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 Hemorrhoid   . Hemorrhoids   . Hyperlipemia   . Hypertension   . 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 Kidney cysts    Left  . Kidney stones   . Lipoma of shoulder 2009   left  . Migraine   . Osteoarthritis   . Osteopenia   . Stented coronary artery     Patient Active Problem List   Diagnosis Date Noted  . Breast cancer, left breast ([place]) 05/20/2015  . Chest pain 05/14/2015  .  Arteriosclerosis of coronary artery 07/22/2014  . Disorder of mitral valve 01/17/2014  . Absolute anemia 10/23/2013  . Benign hypertension 10/23/2013  . Dermatophytic onychia 10/23/2013  . HLD (hyperlipidemia) 10/23/2013  . Idiopathic peripheral neuropathy 10/23/2013    Past Surgical History:  Procedure Laterality Date  . ABDOMINAL HYSTERECTOMY    . benign breast biopsy  2007  . BREAST BIOPSY Left 2007   positive  . BREAST CYST ASPIRATION Right    neg  . BREAST SURGERY    . CARDIAC CATHETERIZATION N/A 06/22/2015   Procedure: Left Heart Cath and Coronary Angiography;  Surgeon: Teodoro Spray, MD;  Location: [facility];  Service: Cardiovascular;  Laterality: N/A;  . CARDIAC CATHETERIZATION    . CATARACT EXTRACTION    . COLONOSCOPY    . CORONARY ANGIOPLASTY    . EYE SURGERY    . FOOT FRACTURE SURGERY Left   . PARTIAL HYSTERECTOMY    . UPPER GASTROINTESTINAL ENDOSCOPY      Prior to Admission medications   Medication Sig Start Date End Date Taking? Authorizing Provider  ALPRAZolam Duanne Moron) 0.25 MG tablet TAKE ONE-HALF TABLET BY MOUTH AT BEDTIME 01/17/15   Historical Provider, MD  aspirin EC 81 MG tablet Take by mouth.    Historical Provider, MD  Azelaic Acid 15 % cream Apply topically. Reported on 11/18/2015  Historical Provider, MD  butalbital-aspirin-caffeine [facility]) 50-325-40 MG per capsule Take by mouth.    Historical Provider, MD  calcium carbonate (TUMS - DOSED IN MG ELEMENTAL CALCIUM) 500 MG chewable tablet Chew by mouth.    Historical Provider, MD  Cholecalciferol (VITAMIN D-1000 MAX ST) 1000 UNITS tablet Take by mouth.    Historical Provider, MD  docusate sodium (STOOL SOFTENER) 100 MG capsule Take by mouth.    Historical Provider, MD  ezetimibe (ZETIA) 10 MG tablet Take by mouth. 03/01/15   Historical Provider, MD  gabapentin (NEURONTIN) 300 MG capsule TAKE ONE CAPSULE BY MOUTH THREE TIMES DAILY 01/09/15   Historical Provider, MD  metoprolol succinate (TOPROL-XL)  25 MG 24 hr tablet Take by mouth. 09/06/15   Historical Provider, MD  Multiple Vitamin (MULTI-VITAMINS) TABS Take by mouth.    Historical Provider, MD  omeprazole (PRILOSEC) 20 MG capsule Take 20 mg by mouth 2 (two) times daily before a meal.    Historical Provider, MD    Allergies Alendronate; Alendronate sodium; Atorvastatin; Azithromycin; Ciprofloxacin; Colestipol; Lidocaine; Lovastatin; Metronidazole; Niacin; Risedronate; Risedronate sodium; Rosuvastatin; and Simvastatin  Family History  Problem Relation Age of Onset  . Heart disease Mother   . Diabetes Father   . Heart disease Father   . Diabetes Sister   . Diabetes Brother   . Cervical cancer Cousin   . Breast cancer Cousin   . Lung cancer Paternal Aunt   . Prostate cancer Maternal Uncle   . Leukemia    . Hypertension      Social History Social History  Substance Use Topics  . Smoking status: Former Smoker    Types: Cigarettes    Quit date: 08/14/1993  . Smokeless tobacco: Never Used  . Alcohol use No    Review of Systems Constitutional: No fever/chills Eyes: No visual changes. ENT: No sore throat. Cardiovascular: Denies chest pain. Respiratory: Denies shortness of breath. Gastrointestinal: No nausea, no vomiting.  No diarrhea.  No constipation. Genitourinary: Negative for dysuria. Musculoskeletal: Negative for back pain. Skin: Negative for rash. Neurological: Negative for headaches, focal weakness or numbness.  10-point ROS otherwise negative.  ____________________________________________   PHYSICAL EXAM:  VITAL SIGNS: ED Triage Vitals  Enc Vitals Group     BP      Pulse      Resp      Temp      Temp src      SpO2      Weight      Height      Head Circumference      Peak Flow      Pain Score      Pain Loc      Pain Edu?      Excl. in [place]?     Constitutional: Alert and oriented. Well appearing and in no acute distress. Eyes: Conjunctivae are normal. PERRL. EOMI. Head: Atraumatic. Nose: No  congestion/rhinnorhea. Mouth/Throat: Mucous membranes are moist.   Neck: No stridor.   Cardiovascular: Normal rate, regular rhythm. Grossly normal heart sounds.  Good peripheral circulation. Respiratory: Normal respiratory effort.  No retractions. Lungs CTAB. Gastrointestinal: Soft  with moderate tenderness to palpation to the right lower quadrant over McBurney's point.  Musculoskeletal: No lower extremity tenderness nor edema.  No joint effusions. Neurologic:  Normal speech and language. No gross focal neurologic deficits are appreciated.  Skin:  Skin is warm, dry and intact. No rash noted. Psychiatric: Mood and affect are normal. Speech and behavior are normal.  ____________________________________________  LABS (all labs ordered are listed, but only abnormal results are displayed)  Labs Reviewed - No data to display ____________________________________________  EKG   ____________________________________________  RADIOLOGY  Outpatient ultrasound showing early appendicitis. ____________________________________________   PROCEDURES  Procedure(s) performed:   Procedures  Critical Care performed:   ____________________________________________   INITIAL IMPRESSION / ASSESSMENT AND PLAN / ED COURSE  Pertinent labs & imaging results that were available during my care of the patient were reviewed by me and considered in my medical decision making (see chart for details).    Clinical Course   ----------------------------------------- 9:14 PM on 07/26/2016 -----------------------------------------  Discussed case with Dr. Pat Patrick of the surgical service will evaluate the patient. The patient that she'll need to be admitted to the hospital for further management and likely surgery. She is understanding of this plan and willing to comply. Antibiotics ordered.   ____________________________________________   FINAL CLINICAL IMPRESSION(S) / ED DIAGNOSES  Acute  appendicitis.    NEW MEDICATIONS STARTED DURING THIS VISIT:  New Prescriptions   No medications on file     Note:  This document was prepared using Dragon voice recognition software and may include unintentional dictation errors.    Orbie Pyo, MD 07/26/16 2114

## 2016-07-26 NOTE — ED Notes (Signed)
Called floor to let them know patient on the way 

## 2016-07-27 DIAGNOSIS — K353 Acute appendicitis with localized peritonitis: Secondary | ICD-10-CM | POA: Diagnosis not present

## 2016-07-27 LAB — CBC
HEMATOCRIT: 37.3 % (ref 35.0–47.0)
HEMOGLOBIN: 12.3 g/dL (ref 12.0–16.0)
MCH: 26.7 pg (ref 26.0–34.0)
MCHC: 32.9 g/dL (ref 32.0–36.0)
MCV: 81.1 fL (ref 80.0–100.0)
Platelets: 242 10*3/uL (ref 150–440)
RBC: 4.6 MIL/uL (ref 3.80–5.20)
RDW: 14.9 % — AB (ref 11.5–14.5)
WBC: 10.1 10*3/uL (ref 3.6–11.0)

## 2016-07-27 LAB — BASIC METABOLIC PANEL
Anion gap: 6 (ref 5–15)
BUN: 12 mg/dL (ref 6–20)
CHLORIDE: 107 mmol/L (ref 101–111)
CO2: 29 mmol/L (ref 22–32)
Calcium: 8.9 mg/dL (ref 8.9–10.3)
Creatinine, Ser: 0.77 mg/dL (ref 0.44–1.00)
GFR calc Af Amer: >60 mL/min (ref >60)
GFR calc non Af Amer: >60 mL/min (ref >60)
GLUCOSE: 93 mg/dL (ref 65–99)
POTASSIUM: 3.9 mmol/L (ref 3.5–5.1)
SODIUM: 142 mmol/L (ref 135–145)

## 2016-07-27 MED ORDER — FAMOTIDINE 20 MG PO TABS
20.0000 mg | ORAL_TABLET | Freq: Every day | ORAL | Status: DC
  Administered 2016-07-27 – 2016-07-29 (×3): 20 mg via ORAL
  Filled 2016-07-27 (×3): qty 1

## 2016-07-27 NOTE — Care Management Obs Status (Signed)
[facility] NOTIFICATION   Patient Details  Name: Kathleen Walls MRN: [id number] Date of Birth: 26-Aug-1940   Medicare Observation Status Notification Given:  Yes    Jolly Mango, RN 07/27/2016, 2:58 PM

## 2016-07-27 NOTE — Progress Notes (Signed)
75 year old female with acute appendicitis. The patient had a long discussion with Dr. Pat Patrick and given her age and other comorbidities discussed attempting antibiotic treatment. The patient states that she feels much better the last night. She states she is not having any pain issues just sits here but still some pain when she gets up and moves around. She denies any nausea or vomiting and is passing gas and even had a soft normal bowel movement today.  Vitals:   07/27/16 0840 07/27/16 1154  BP: (!) 107/58 (!) 105/48  Pulse: 67 (!) 58  Resp: 18 18  Temp: 97.7 F (36.5 C) 98 F (36.7 C)   I/O last 3 completed shifts: In: 267.5 [I.V.:217.5; IV Piggyback:50] Out: -  No intake/output data recorded.   PE:  Gen: NAD Res: CTAB/L Cardio: RRR Abd: soft, non-distended, minimally tender in RLQ Ext: 2+ pulses, no edema  CBC Latest Ref Rng & Units 07/27/2016 07/26/2016 05/14/2015  WBC 3.6 - 11.0 K/uL 10.1 11.8(H) 7.3  Hemoglobin 12.0 - 16.0 g/dL 12.3 12.1 14.1  Hematocrit 35.0 - 47.0 % 37.3 36.9 44.2  Platelets 150 - 440 K/uL 242 240 251   CMP Latest Ref Rng & Units 07/27/2016 07/26/2016 05/14/2015  Glucose 65 - 99 mg/dL 93 - 99  BUN 6 - 20 mg/dL 12 - 18  Creatinine 0.44 - 1.00 mg/dL 0.77 0.75 0.77  Sodium 135 - 145 mmol/L 142 - 141  Potassium 3.5 - 5.1 mmol/L 3.9 - 4.1  Chloride 101 - 111 mmol/L 107 - 106  CO2 22 - 32 mmol/L 29 - 29  Calcium 8.9 - 10.3 mg/dL 8.9 - 9.48   A/P:  75 year old female with acute appendicitis started on Zosyn last night. She seems to be having some improvement with antibiotic treatment alone.The risks, benefits, complications, treatment options, and expected outcomes were discussed with the patient and her family.  The treatment of antibiotics alone was discussed giving a 20% chance that this could fail and surgery would be necessary.  Since that seems to be some improvement give her a clear liquid diet antibiotics. She was instructed if she were to start to have  increased pain in the right lower quadrant or to worsen that she would have to go to operating room.

## 2016-07-27 NOTE — Progress Notes (Signed)
Initial Nutrition Assessment  DOCUMENTATION CODES:   Not applicable  INTERVENTION:  1. Monitor for diet advancement per MD/NP/PA  NUTRITION DIAGNOSIS:   Inadequate oral intake related to poor appetite as evidenced by per patient/family report.  GOAL:   Patient will meet greater than or equal to 90% of their needs  MONITOR:   PO intake, I & O's, Labs, Weight trends, Supplement acceptance  REASON FOR ASSESSMENT:   Low [place]    ASSESSMENT:   She was in her usual state of good health until approximately 48 hours ago when she developed generalized abdominal pain. She had some mild anorexia but no nausea or vomiting. Her symptoms persisted with intermittent abdominal pain both right and left lower quadrants but today localized to the right lower quadrant.  Spoke with pt this morning. States she is feeling better. Denies any recent wt loss, reports this is her normal weight since she had chemo + XRT years ago. Per chart, exhibits a 5#/5.1% insignificant wt loss over 8 months. Advanced to clears now - tolerating. Reports eating 3 meals a day when she is in normal health - she and her husband often eat out. Nutrition-Focused physical exam completed. Findings are mild fat depletion, no muscle depletion, and no edema.   Labs and medications reviewed: D5 + 1/2 NS + KCL @ 23mL/hr --> 204 calories  Diet Order:  Diet clear liquid Room service appropriate? Yes; Fluid consistency: Thin  Skin:  Reviewed, no issues  Last BM:  12/13  Height:   Ht Readings from Last 1 Encounters:  07/26/16 5\' 4"  (1.626 m)    Weight:   Wt Readings from Last 1 Encounters:  07/26/16 92 lb (41.7 kg)    Ideal Body Weight:  54.54 kg  BMI:  Body mass index is 15.79 kg/m.  Estimated Nutritional Needs:   Kcal:  [id number] (MSJ x1.2-1.4)  Protein:  42-50 gm  Fluid:  >/= 1L  EDUCATION NEEDS:   No education needs identified at this time  Satira Anis. Eilan Mcinerny, MS, RD LDN Inpatient Clinical  Dietitian Pager [phone]

## 2016-07-27 NOTE — Progress Notes (Signed)
Subjective:   She feels better this evening. She's not been nauseated and did have a bowel movement. She's had no significant fever. White blood cell count is down.  Vital signs in last 24 hours: Temp:  [97.7 F (36.5 C)-98.3 F (36.8 C)] 98 F (36.7 C) (12/14 2010) Pulse Rate:  [58-70] 70 (12/14 2010) Resp:  [15-19] 18 (12/14 2010) BP: (102-150)/(46-72) 117/49 (12/14 2010) SpO2:  [94 %-99 %] 96 % (12/14 2010) Weight:  [41.7 kg (92 lb)] 41.7 kg (92 lb) (12/13 2152) Last BM Date: 07/27/16  Intake/Output from previous day: 12/13 0701 - 12/14 0700 In: 267.5 [I.V.:217.5; IV Piggyback:50] Out: -   Exam:  She has less abdominal pain. She has active bowel sounds and no guarding. Chest some minimal tenderness in right lower quadrant.  Lab Results:  CBC  Recent Labs  07/26/16 2254 07/27/16 0414  WBC 11.8* 10.1  HGB 12.1 12.3  HCT 36.9 37.3  PLT 240 242   CMP     Component Value Date/Time   NA 142 07/27/2016 0414   NA 143 12/31/2013 0406   K 3.9 07/27/2016 0414   K 4.5 12/31/2013 0406   CL 107 07/27/2016 0414   CL 111 (H) 12/31/2013 0406   CO2 29 07/27/2016 0414   CO2 28 12/31/2013 0406   GLUCOSE 93 07/27/2016 0414   GLUCOSE 115 (H) 12/31/2013 0406   BUN 12 07/27/2016 0414   BUN 10 12/31/2013 0406   CREATININE 0.77 07/27/2016 0414   CREATININE 0.87 12/31/2013 0406   CALCIUM 8.9 07/27/2016 0414   CALCIUM 8.6 12/31/2013 0406   GFRNONAA >60 07/27/2016 0414   GFRNONAA >60 12/31/2013 0406   GFRAA >60 07/27/2016 0414   GFRAA >60 12/31/2013 0406   PT/INR No results for input(s): LABPROT, INR in the last 72 hours.  Studies/Results: Ct Abdomen Pelvis W Contrast  Result Date: 07/26/2016 CLINICAL DATA:  75 year old female with a history of right lower quadrant pain for 2 days. Leukocytosis. History of breast cancer. EXAM: CT ABDOMEN AND PELVIS WITH CONTRAST TECHNIQUE: Multidetector CT imaging of the abdomen and pelvis was performed using the standard protocol following  bolus administration of intravenous contrast. CONTRAST:  43mL ISOVUE-300 IOPAMIDOL (ISOVUE-300) INJECTION 61% COMPARISON:  Chest CT 07/21/2014, abdominal CT 08/29/2012 FINDINGS: Lower chest: Low-density cystic structure partially imaged in the right lower pericardial region. No pericardial fluid/ thickening. Hepatobiliary: No focal liver abnormality is seen. No gallstones, gallbladder wall thickening, or biliary dilatation.Unremarkable gallbladder. Pancreas: No peripancreatic fluid or inflammatory changes. Spleen: Normal in size without focal abnormality. Adrenals/Urinary Tract: Right adrenal gland unremarkable.  Left adrenal gland unremarkable. Right kidney: No right-sided hydronephrosis. No nephrolithiasis. Left Kidney: No left-sided hydronephrosis or nephrolithiasis. Re- demonstration of parapelvic cysts. There is no evidence of left or right perinephric stranding. Course of the bilateral ureters unremarkable. Unremarkable appearance of the urinary bladder. Stomach/Bowel: Unremarkable appearance of stomach. Unremarkable small bowel. No abnormally distended small bowel or transition point. Colonic diverticula again evident. No with associated inflammatory changes. The base of the appendix is dilated/ thickened measuring as great as 10 mm-11 mm. Mild inflammatory changes at the base within the adjacent fat. Vascular/Lymphatic: Calcifications of the abdominal aorta extending into the iliac vasculature and femoral vasculature. Reproductive: Hysterectomy Other: Small fact containing left femoral hernia. Small fact containing umbilical hernia. Nonspecific pelvic floor laxity. Evidence of cystocele on the sagittal reformatted images. Musculoskeletal: No displaced fracture. Degenerative changes of the visualized thoracolumbar spine. Mild degenerative changes of the bilateral hips. IMPRESSION: Evidence of early  appendicitis with dilated appendix base to 10 mm- 11 mm and associated inflammatory changes. Recommend referral  for surgical evaluation. These results were called by telephone at the time of interpretation on 07/26/2016 at 8:40 pm to Dr. Caryl Comes who verbally acknowledged these results. Nonspecific pelvic floor laxity with evidence of cystocele. Diverticular disease without evidence of acute diverticulitis. Aortic atherosclerosis. Signed, Dulcy Fanny. Earleen Newport, DO [facility] Radiology Electronically Signed   By: Corrie Mckusick D.O.   On: 07/26/2016 20:40    Assessment/Plan: We will continue her IV antibiotics for another 24 hours and hopefully be able to convert to oral antibiotics. If that plan works we can possibly discharge her over the weekend on oral antibiotics for follow-up in the office next week. She is in agreement with this plan.

## 2016-07-28 LAB — CBC
HEMATOCRIT: 36.7 % (ref 35.0–47.0)
HEMOGLOBIN: 12.2 g/dL (ref 12.0–16.0)
MCH: 27.1 pg (ref 26.0–34.0)
MCHC: 33.2 g/dL (ref 32.0–36.0)
MCV: 81.4 fL (ref 80.0–100.0)
Platelets: 231 10*3/uL (ref 150–440)
RBC: 4.5 MIL/uL (ref 3.80–5.20)
RDW: 15.1 % — ABNORMAL HIGH (ref 11.5–14.5)
WBC: 6.6 10*3/uL (ref 3.6–11.0)

## 2016-07-28 NOTE — Progress Notes (Signed)
75 year old female with acute appendicitis. The patient has had much improvement with the antibiotics today. She is not having any pain at this time.  She tolerated the regular diet well.  She is a little confused about where she is and the year, which has happened previously in hospital stays.   Vitals:   07/28/16 0810 07/28/16 1353  BP: 124/67 (!) 104/53  Pulse: 61 80  Resp: 18 18  Temp: 98.1 F (36.7 C) 98.4 F (36.9 C)   I/O last 3 completed shifts: In: 3055 [P.O.:1420; I.V.:1435; IV Piggyback:200] Out: -  Total I/O In: 360 [P.O.:360] Out: -    PE:  Gen: NAD Res: CTAB/L Cardio: RRR Abd: soft, non-distended, non tender Ext: 2+ pulses, no edema  CBC Latest Ref Rng & Units 07/28/2016 07/27/2016 07/26/2016  WBC 3.6 - 11.0 K/uL 6.6 10.1 11.8(H)  Hemoglobin 12.0 - 16.0 g/dL 12.2 12.3 12.1  Hematocrit 35.0 - 47.0 % 36.7 37.3 36.9  Platelets 150 - 440 K/uL 231 242 240   CMP Latest Ref Rng & Units 07/27/2016 07/26/2016 05/14/2015  Glucose 65 - 99 mg/dL 93 - 99  BUN 6 - 20 mg/dL 12 - 18  Creatinine 0.44 - 1.00 mg/dL 0.77 0.75 0.77  Sodium 135 - 145 mmol/L 142 - 141  Potassium 3.5 - 5.1 mmol/L 3.9 - 4.1  Chloride 101 - 111 mmol/L 107 - 106  CO2 22 - 32 mmol/L 29 - 29  Calcium 8.9 - 10.3 mg/dL 8.9 - 9.57   A/P:  75 year old female with acute appendicitis treated with Zosyn.  She is doing well and seems to be resolving with antibiotics.  She is a little confused right now, but likely sundowning.  She will be d/c in AM on po antibiotics if confusion resolves.

## 2016-07-29 MED ORDER — AMOXICILLIN-POT CLAVULANATE 875-125 MG PO TABS: 1.0000 | ORAL_TABLET | Freq: Two times a day (BID) | ORAL | 0 refills | Status: DC

## 2016-07-29 NOTE — Progress Notes (Signed)
Shift assessment completed. Pt is oob to chair, alert and oriented to all but date, husband is present. Pt stating she feels much better, is anticipating discharge today. Lungs are clear bilat, pt is on room air, S1S2 asucultated, abdomen is soft, bs heard. Pt denied pain, stated she had some rectal discomfort from stools. PPP, no edema noted, teds on bilat. PIV #22 intact to RAC, site is free of redness and swelling.  Pt si able to make needs known.

## 2016-07-29 NOTE — Progress Notes (Signed)
This Probation officer gave pt her am meds, and removed piv #20 from [facility] with catheter intact, pt tolerated well. Husband present, this Probation officer briefly reviewed d/c instructions with he and patient, they verbalized understanding, and husband signed, received paperwork. Future follow up appts were discussed as well.

## 2016-07-29 NOTE — Plan of Care (Signed)
Problem: Bowel/Gastric: Goal: Will not experience complications related to bowel motility Outcome: Completed/Met Date Met: 07/29/16 Pt has met all goals for discharge.

## 2016-07-29 NOTE — Discharge Summary (Signed)
Physician Discharge Summary  Patient ID: PANSEY TRANA MRN: [id number] DOB/AGE: 09/23/1940 75 y.o.  Admit date: 07/26/2016 Discharge date: 07/29/2016  Admission Diagnoses: acute appendicitis  Discharge Diagnoses:  Active Problems:   Appendicitis, acute   Discharged Condition: good  Hospital Course: 75 yr old female with acute appendicitis.  She was treated with antibiotics alone and improved.  She is no longer having any pain in the abdomen.  She is up and moving around well and tolerating a regular diet.  She is having some diarrhea and was instructed to start taking some yogurt at home to help.  We will discharge her home today on Augmentin BID for 12 more days to start tonight.  She is to call if she gets fever, chills, N/V or pain comes back in the abdomen.  She will f/u with my partner Dr. Hampton Abbot on 12/19 in the [facility] clinic.   Consults: None  Significant Diagnostic Studies: CT scan  Treatments: antibiotics: Zosyn  Discharge Exam: Blood pressure 107/66, pulse 79, temperature 98.4 F (36.9 C), temperature source Oral, resp. rate 18, height 5\' 4"  (1.626 m), weight 92 lb (41.7 kg), SpO2 96 %. General appearance: alert, cooperative and no distress GI: soft, non-tender; bowel sounds normal; no masses,  no organomegaly Extremities: extremities normal, atraumatic, no cyanosis or edema  Disposition: 01-Home or Self Care  Discharge Instructions    Activity as tolerated - No restrictions    Complete by:  As directed    Call MD for:  persistant nausea and vomiting    Complete by:  As directed    Call MD for:  severe uncontrolled pain    Complete by:  As directed    Call MD for:  temperature >100.4    Complete by:  As directed    Diet general    Complete by:  As directed    Increase activity slowly    Complete by:  As directed    May shower / Bathe    Complete by:  As directed    No wound care    Complete by:  As directed      Allergies as of 07/29/2016    Reactions   Alendronate    Other reaction(s): Unknown   Alendronate Sodium    Other reaction(s): Unknown   Atorvastatin    Other reaction(s): Muscle Pain Other reaction(s): Muscle Pain   Azithromycin Hives   Ciprofloxacin    Other reaction(s): Headache   Colestipol    Other reaction(s): Unknown   Lidocaine Hives   Lovastatin    Other reaction(s): Headache Other reaction(s): Headache   Metronidazole    Other reaction(s): Headache   Niacin    Other reaction(s): Unknown   Risedronate    Other reaction(s): Unknown   Risedronate Sodium Other (See Comments)   Rosuvastatin    Other reaction(s): Unknown Other reaction(s): Unknown   Simvastatin    Other reaction(s): Headache Other reaction(s): Headache      Medication List    TAKE these medications   ALPRAZolam 0.25 MG tablet Commonly known as:  XANAX TAKE ONE-HALF TABLET BY MOUTH AT BEDTIME   amoxicillin-clavulanate 875-125 MG tablet Commonly known as:  AUGMENTIN Take 1 tablet by mouth 2 (two) times daily.   aspirin EC 81 MG tablet Take by mouth.   Azelaic Acid 15 % cream Apply topically. Reported on 11/18/2015   butalbital-aspirin-caffeine 50-325-40 MG capsule Commonly known as:  FIORINAL Take by mouth.   calcium carbonate 500 MG chewable tablet Commonly known  as:  TUMS - dosed in mg elemental calcium Chew by mouth.   gabapentin 300 MG capsule Commonly known as:  NEURONTIN TAKE ONE CAPSULE BY MOUTH THREE TIMES DAILY   metoprolol succinate 25 MG 24 hr tablet Commonly known as:  TOPROL-XL Take by mouth.   MULTI-VITAMINS Tabs Take by mouth.   omeprazole 20 MG capsule Commonly known as:  PRILOSEC Take 20 mg by mouth 2 (two) times daily before a meal.   STOOL SOFTENER 100 MG capsule Generic drug:  docusate sodium Take by mouth.   VITAMIN D-1000 MAX ST 1000 units tablet Generic drug:  Cholecalciferol Take by mouth.   ZETIA 10 MG tablet Generic drug:  ezetimibe Take by mouth.      Follow-up  [place], MD Follow up.   Specialty:  Surgery Why:  f/u Dr. Hampton Abbot on Tuesday 12/19 at 10:45am Contact information: [address]  [address] [place] [address] [phone]           Signed: Hubbard Robinson 07/29/2016, 9:13 AM

## 2016-08-01 ENCOUNTER — Ambulatory Visit (INDEPENDENT_AMBULATORY_CARE_PROVIDER_SITE_OTHER): Payer: Medicare Other | Admitting: Surgery

## 2016-08-01 ENCOUNTER — Encounter: Payer: Self-pay | Admitting: Surgery

## 2016-08-01 VITALS — BP 125/65 | HR 62 | Temp 97.8°F | Ht 63.5 in | Wt 92.0 lb

## 2016-08-01 DIAGNOSIS — Z8719 Personal history of other diseases of the digestive system: Secondary | ICD-10-CM | POA: Insufficient documentation

## 2016-08-01 NOTE — Progress Notes (Signed)
08/01/2016  Reason for Visit:  Follow-up for acute appendicitis  History of Present Illness: Kathleen Walls is a 75 y.o. female who was admitted to the hospital on 12/13 with acute appendicitis that was successfully treated conservatively with antibiotics and she was discharged on 12/16 tolerating a regular diet with a normalized white blood cell count. She presents today for follow-up appointment. She currently denies having any abdominal pain, nausea, vomiting. She has been tolerating a diet with no episodes of pain following her meals. She denies having any fevers at home. She is continued to take her antibiotics and has 10 more days of this.  Past Medical History: Past Medical History:  Diagnosis Date  . Arthritis   . Breast cancer [facility]) 2007    2009 breast cancer stage IIA T2 N0, ER positive, HER-2 negative, 2.5 cm primary tumor, poorly differentiated, status post wide local excision, one sentinel lymph node negative, high-risk Oncotype score 48  . Cataracts, both eyes   . Colitis   . Coronary artery disease   . Detached vitreous humor   . Diverticulitis   . 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 Gastritis   . GERD (gastroesophageal reflux disease)   . 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 Hemorrhoid   . Hemorrhoids   . Hyperlipemia   . Hypertension   . 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 Kidney cysts    Left  . Kidney stones   . Lipoma of shoulder 2009   left  . Migraine   . Osteoarthritis   . Osteopenia   . Stented coronary artery      Past Surgical History: Past Surgical History:  Procedure Laterality Date  . ABDOMINAL HYSTERECTOMY    . benign breast biopsy  2007  . BREAST BIOPSY Left 2007   positive  . BREAST CYST ASPIRATION Right    neg  . BREAST SURGERY    . CARDIAC CATHETERIZATION N/A 06/22/2015   Procedure: Left Heart Cath and Coronary Angiography;  Surgeon: Teodoro Spray, MD;  Location: [place] [facility];   Service: Cardiovascular;  Laterality: N/A;  . CARDIAC CATHETERIZATION    . CATARACT EXTRACTION    . COLONOSCOPY    . CORONARY ANGIOPLASTY    . EYE SURGERY    . FOOT FRACTURE SURGERY Left   . PARTIAL HYSTERECTOMY    . UPPER GASTROINTESTINAL ENDOSCOPY      Home Medications: Prior to Admission medications   Medication Sig Start Date End Date Taking? Authorizing Provider  ALPRAZolam Duanne Moron) 0.25 MG tablet TAKE ONE-HALF TABLET BY MOUTH AT BEDTIME 01/17/15  Yes Historical Provider, MD  amoxicillin-clavulanate (AUGMENTIN) 875-125 MG tablet Take 1 tablet by mouth 2 (two) times daily. 07/29/16  Yes Hubbard Robinson, MD  aspirin EC 81 MG tablet Take by mouth.   Yes Historical Provider, MD  calcium carbonate (TUMS - DOSED IN MG ELEMENTAL CALCIUM) 500 MG chewable tablet Chew by mouth.   Yes Historical Provider, MD  Cholecalciferol (VITAMIN D-1000 MAX ST) 1000 UNITS tablet Take by mouth.   Yes Historical Provider, MD  docusate sodium (STOOL SOFTENER) 100 MG capsule Take by mouth.   Yes Historical Provider, MD  ezetimibe (ZETIA) 10 MG tablet Take by mouth. 03/01/15  Yes Historical Provider, MD  gabapentin (NEURONTIN) 300 MG capsule TAKE ONE CAPSULE BY MOUTH THREE TIMES DAILY 01/09/15  Yes Historical Provider, MD  metoprolol succinate (TOPROL-XL) 25 MG 24 hr  tablet Take by mouth. 09/06/15  Yes Historical Provider, MD  Multiple Vitamin (MULTI-VITAMINS) TABS Take by mouth.   Yes Historical Provider, MD  omeprazole (PRILOSEC) 20 MG capsule Take 20 mg by mouth 2 (two) times daily before a meal.   Yes Historical Provider, MD  Azelaic Acid 15 % cream Apply topically. Reported on 11/18/2015    Historical Provider, MD  butalbital-aspirin-caffeine [facility]) 50-325-40 MG per capsule Take by mouth.    Historical Provider, MD    Allergies: Allergies  Allergen Reactions  . Alendronate     Other reaction(s): Unknown  . Alendronate Sodium     Other reaction(s): Unknown  . Atorvastatin     Other reaction(s):  Muscle Pain Other reaction(s): Muscle Pain  . Azithromycin Hives  . Ciprofloxacin     Other reaction(s): Headache  . Colestipol     Other reaction(s): Unknown  . Lidocaine Hives  . Lovastatin     Other reaction(s): Headache Other reaction(s): Headache  . Metronidazole     Other reaction(s): Headache  . Niacin     Other reaction(s): Unknown  . Risedronate     Other reaction(s): Unknown  . Risedronate Sodium Other (See Comments)  . Rosuvastatin     Other reaction(s): Unknown Other reaction(s): Unknown  . Simvastatin     Other reaction(s): Headache Other reaction(s): Headache    Social History:  reports that she quit smoking about 22 years ago. Her smoking use included Cigarettes. She has never used smokeless tobacco. She reports that she does not drink alcohol or use drugs.   Family History: Family History  Problem Relation Age of Onset  . Heart disease Mother   . Diabetes Father   . Heart disease Father   . Diabetes Sister   . Diabetes Brother   . Cervical cancer Cousin   . Breast cancer Cousin   . Lung cancer Paternal Aunt   . Prostate cancer Maternal Uncle   . Leukemia    . Hypertension      Review of Systems: Review of Systems  Constitutional: Negative for chills and fever.  HENT: Negative for hearing loss.   Eyes: Negative for blurred vision.  Respiratory: Negative for cough and shortness of breath.   Cardiovascular: Negative for chest pain and leg swelling.  Gastrointestinal: Negative for abdominal pain, heartburn, nausea and vomiting.  Genitourinary: Negative for dysuria.  Musculoskeletal: Negative for myalgias.  Skin: Negative for rash.  Neurological: Negative for dizziness.  Psychiatric/Behavioral: Negative for depression.  All other systems reviewed and are negative.   Physical Exam BP 125/65   Pulse 62   Temp 97.8 F (36.6 C) (Oral)   Ht 5' 3.5" (1.613 m)   Wt 41.7 kg (92 lb)   BMI 16.04 kg/m  CONSTITUTIONAL: No acute distress HEENT:   Normocephalic, atraumatic, extraocular motion intact. NECK: Trachea is midline, and there is no jugular venous distension.  RESPIRATORY:  Lungs are clear, and breath sounds are equal bilaterally. Normal respiratory effort without pathologic use of accessory muscles. CARDIOVASCULAR: Heart is regular without murmurs, gallops, or rubs. GI: The abdomen is soft, nondistended, nontender to palpation. MUSCULOSKELETAL:  Normal muscle strength and tone in all four extremities.  No peripheral edema or cyanosis. SKIN: Skin turgor is normal. There are no pathologic skin lesions.  NEUROLOGIC:  Motor and sensation is grossly normal.  Cranial nerves are grossly intact. PSYCH:  Alert and oriented to person, place and time. Affect is normal.  Laboratory Analysis: No results found for this or any previous  visit (from the past 24 hour(s)).  Imaging: No results found.  Assessment and Plan: This is a 75 y.o. female with a history of acute appendicitis, treated conservatively with antibiotics.  -Currently no clinical indications to suspect recurrence or worsening. No imaging or laboratory studies are needed at this point. The patient has been doing well and recovering well from her hospitalization. -Patient at this point is not interested in an interval appendectomy. Patient has been instructed regarding the symptoms and signs to look out for including fevers, chills, right lower quadrant abdominal pain, nausea, vomiting. Any symptoms such as these recur or worsen she should call us right away to be further evaluated. Otherwise at this point it is reasonable to continue with watchful waiting. -Patient may follow-up on an as-needed basis.   Melvyn Neth, [place]

## 2016-08-01 NOTE — Patient Instructions (Signed)
Please call our office if you have any questions or concerns.  

## 2016-08-21 ENCOUNTER — Other Ambulatory Visit: Payer: Self-pay | Admitting: Internal Medicine

## 2016-08-21 ENCOUNTER — Ambulatory Visit
Admission: RE | Admit: 2016-08-21 | Discharge: 2016-08-21 | Disposition: A | Discharge status: Home / Self Care | Payer: Medicare Other | Source: Ambulatory Visit | Attending: Internal Medicine | Admitting: Internal Medicine

## 2016-08-21 DIAGNOSIS — I700 Atherosclerosis of aorta: Secondary | ICD-10-CM | POA: Diagnosis not present

## 2016-08-21 DIAGNOSIS — R1084 Generalized abdominal pain: Secondary | ICD-10-CM | POA: Insufficient documentation

## 2016-08-21 DIAGNOSIS — K5730 Diverticulosis of large intestine without perforation or abscess without bleeding: Secondary | ICD-10-CM | POA: Diagnosis not present

## 2016-08-21 MED ORDER — IOPAMIDOL (ISOVUE-300) INJECTION 61%
75.0000 mL | Freq: Once | INTRAVENOUS | Status: AC | PRN
  Administered 2016-08-21: 75 mL via INTRAVENOUS

## 2016-08-22 ENCOUNTER — Ambulatory Visit
Admission: RE | Admit: 2016-08-22 | Discharge: 2016-08-22 | Disposition: A | Discharge status: Home / Self Care | Payer: Medicare Other | Source: Ambulatory Visit | Attending: Internal Medicine | Admitting: Internal Medicine

## 2016-08-22 DIAGNOSIS — Z1231 Encounter for screening mammogram for malignant neoplasm of breast: Secondary | ICD-10-CM | POA: Diagnosis not present

## 2016-09-11 ENCOUNTER — Ambulatory Visit (INDEPENDENT_AMBULATORY_CARE_PROVIDER_SITE_OTHER): Payer: Medicare Other | Admitting: Vascular Surgery

## 2016-09-11 ENCOUNTER — Encounter (INDEPENDENT_AMBULATORY_CARE_PROVIDER_SITE_OTHER): Payer: Self-pay | Admitting: Vascular Surgery

## 2016-09-11 ENCOUNTER — Ambulatory Visit (INDEPENDENT_AMBULATORY_CARE_PROVIDER_SITE_OTHER): Payer: Medicare Other

## 2016-09-11 ENCOUNTER — Other Ambulatory Visit (INDEPENDENT_AMBULATORY_CARE_PROVIDER_SITE_OTHER): Payer: Self-pay | Admitting: Vascular Surgery

## 2016-09-11 VITALS — BP 119/61 | HR 59 | Resp 15 | Ht 62.0 in | Wt 92.0 lb

## 2016-09-11 DIAGNOSIS — I6523 Occlusion and stenosis of bilateral carotid arteries: Secondary | ICD-10-CM | POA: Diagnosis not present

## 2016-09-11 DIAGNOSIS — I2510 Atherosclerotic heart disease of native coronary artery without angina pectoris: Secondary | ICD-10-CM | POA: Diagnosis not present

## 2016-09-11 DIAGNOSIS — I10 Essential (primary) hypertension: Secondary | ICD-10-CM

## 2016-09-11 DIAGNOSIS — E782 Mixed hyperlipidemia: Secondary | ICD-10-CM | POA: Diagnosis not present

## 2016-09-11 DIAGNOSIS — I6529 Occlusion and stenosis of unspecified carotid artery: Secondary | ICD-10-CM | POA: Insufficient documentation

## 2016-09-11 LAB — VAS US CAROTID
LCCAPDIAS: 28 cm/s
LCCAPSYS: 135 cm/s
LEFT ECA DIAS: -13 cm/s
Left CCA dist dias: 26 cm/s
Left CCA dist sys: 111 cm/s
Left ICA dist dias: -33 cm/s
Left ICA dist sys: -113 cm/s
Left ICA prox dias: -34 cm/s
Left ICA prox sys: -138 cm/s
RCCADSYS: -93 cm/s
RIGHT CCA MID DIAS: 24 cm/s
RIGHT ECA DIAS: -10 cm/s
Right CCA prox dias: 26 cm/s
Right CCA prox sys: 115 cm/s

## 2016-09-11 NOTE — Progress Notes (Signed)
MRN : [id number]  Kathleen Walls is a 76 y.o. (11/15/1940) female who presents with chief complaint of  Chief Complaint  Patient presents with  . Re-evaluation    1 year follow up carotid ultrasound  .  History of Present Illness: The patient is seen for follow up evaluation of carotid stenosis. The carotid stenosis followed by ultrasound.   The patient denies amaurosis fugax. There is no recent history of TIA symptoms or focal motor deficits. There is no prior documented CVA.  The patient is taking enteric-coated aspirin 81 mg daily.  There is no history of migraine headaches. There is no history of seizures.  The patient has a history of coronary artery disease, no recent episodes of angina or shortness of breath. The patient denies PAD or claudication symptoms. There is a history of hyperlipidemia which is being treated with a statin.    Carotid Duplex done today shows a 21-30% RICA, and <86% LICA.  No change compared to last study in 02/18/2015  Current Meds  Medication Sig  . ALPRAZolam (XANAX) 0.25 MG tablet TAKE ONE-HALF TABLET BY MOUTH AT BEDTIME  . amoxicillin-clavulanate (AUGMENTIN) 875-125 MG tablet Take 1 tablet by mouth 2 (two) times daily.  Marland Kitchen aspirin EC 81 MG tablet Take by mouth.  . Azelaic Acid 15 % cream Apply topically. Reported on 11/18/2015  . butalbital-aspirin-caffeine (FIORINAL) 50-325-40 MG per capsule Take by mouth.  . calcium carbonate (TUMS - DOSED IN MG ELEMENTAL CALCIUM) 500 MG chewable tablet Chew by mouth.  . Cholecalciferol (VITAMIN D-1000 MAX ST) 1000 UNITS tablet Take by mouth.  . docusate sodium (STOOL SOFTENER) 100 MG capsule Take by mouth.  . donepezil (ARICEPT) 5 MG tablet   . ezetimibe (ZETIA) 10 MG tablet Take by mouth.  . gabapentin (NEURONTIN) 300 MG capsule TAKE ONE CAPSULE BY MOUTH THREE TIMES DAILY  . metoprolol succinate (TOPROL-XL) 25 MG 24 hr tablet Take by mouth.  . Multiple Vitamin (MULTI-VITAMINS) TABS Take by mouth.  Marland Kitchen  omeprazole (PRILOSEC) 20 MG capsule Take 20 mg by mouth 2 (two) times daily before a meal.  . vancomycin (VANCOCIN) 125 MG capsule     Past Medical History:  Diagnosis Date  . Arthritis   . Breast cancer [facility]) 2009    2009 breast cancer stage IIA T2 N0, ER positive, HER-2 negative, 2.5 cm primary tumor, poorly differentiated, status post wide local excision, one sentinel lymph node negative, high-risk Oncotype score 48  . Cataracts, both eyes   . Colitis   . Coronary artery disease   . Detached vitreous humor   . Diverticulitis   . 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 Gastritis   . GERD (gastroesophageal reflux disease)   . 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 Hemorrhoid   . Hemorrhoids   . Hyperlipemia   . 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 Kidney cysts    Left  . Kidney stones   . Lipoma of shoulder 2009   left  . Migraine   . Osteoarthritis   . Osteopenia   . Stented coronary artery     Past Surgical History:  Procedure Laterality Date  . ABDOMINAL HYSTERECTOMY    . benign breast biopsy  2007  . BREAST BIOPSY Left 2007   positive  . BREAST CYST ASPIRATION Right    neg  . BREAST SURGERY    . CARDIAC CATHETERIZATION N/A 06/22/2015  Procedure: Left Heart Cath and Coronary Angiography;  Surgeon: Teodoro Spray, MD;  Location: [place] [facility];  Service: Cardiovascular;  Laterality: N/A;  . CARDIAC CATHETERIZATION    . CATARACT EXTRACTION    . COLONOSCOPY    . CORONARY ANGIOPLASTY    . EYE SURGERY    . FOOT FRACTURE SURGERY Left   . PARTIAL HYSTERECTOMY    . UPPER GASTROINTESTINAL ENDOSCOPY      Social History Social History  Substance Use Topics  . Smoking status: Former Smoker    Types: Cigarettes    Quit date: 08/14/1993  . Smokeless tobacco: Never Used  . Alcohol use No    Family History Family History  Problem Relation Age of Onset  . Heart disease Mother   . Diabetes Father   . Heart disease  Father   . Diabetes Sister   . Diabetes Brother   . Cervical cancer Cousin   . Breast cancer Cousin   . Lung cancer Paternal Aunt   . Prostate cancer Maternal Uncle   . Leukemia    . Hypertension    No family history of bleeding/clotting disorders, porphyria or autoimmune disease   Allergies  Allergen Reactions  . Alendronate     Other reaction(s): Unknown  . Alendronate Sodium     Other reaction(s): Unknown  . Atorvastatin     Other reaction(s): Muscle Pain Other reaction(s): Muscle Pain  . Azithromycin Hives  . Ciprofloxacin     Other reaction(s): Headache  . Colestipol     Other reaction(s): Unknown  . Lidocaine Hives  . Lovastatin     Other reaction(s): Headache Other reaction(s): Headache  . Metronidazole     Other reaction(s): Headache  . Niacin     Other reaction(s): Unknown  . Risedronate     Other reaction(s): Unknown  . Risedronate Sodium Other (See Comments)  . Rosuvastatin     Other reaction(s): Unknown Other reaction(s): Unknown  . Simvastatin     Other reaction(s): Headache Other reaction(s): Headache     REVIEW OF SYSTEMS (Negative unless checked)  Constitutional: _0 Weight loss  _1 Fever  _2 Chills Cardiac: _3 Chest pain   _4 Chest pressure   _5 Palpitations   _6 Shortness of breath when laying flat   _7 Shortness of breath with exertion. Vascular:  _8 Pain in legs with walking   _9 Pain in legs at rest  _10 History of DVT   _11 Phlebitis   _12 Swelling in legs   _13 Varicose veins   _14 Non-healing ulcers Pulmonary:   _15 Uses home oxygen   _16 Productive cough   _17 Hemoptysis   _18 Wheeze  _19 COPD   _20 Asthma Neurologic:  _21 Dizziness   _22 Seizures   _23 History of stroke   _24 History of TIA  _25 Aphasia   _26 Vissual changes   _27 Weakness or numbness in arm   _28 Weakness or numbness in leg Musculoskeletal:   _29 Joint swelling   _30 Joint pain   _31 Low back pain Hematologic:  _32 Easy bruising  _33 Easy bleeding   _34 Hypercoagulable state   _35 Anemic Gastrointestinal:  _36 Diarrhea   _37 Vomiting   _38 Gastroesophageal reflux/heartburn   _39 Difficulty swallowing. Genitourinary:  _40 Chronic kidney disease   _41 Difficult urination  _42 Frequent urination   _43 Blood in urine Skin:  _44 Rashes   _45 Ulcers  Psychological:  _46 History of anxiety   _47  History of major depression.  Physical Examination  Vitals:   09/11/16 1116 09/11/16 1117  BP: (!) 107/54 119/61  Pulse: 63 (!) 59  Resp: 15   Weight: 92 lb (41.7 kg)   Height: _48  (1.575 m)  Body mass index is 16.83 kg/m. Gen: WD/WN, NAD Head: [place]/AT, No temporalis wasting.  Ear/Nose/Throat: Hearing grossly intact, nares w/o erythema or drainage, poor dentition Eyes: PER, EOMI, sclera nonicteric.  Neck: Supple, no masses.  No bruit or JVD.  Pulmonary:  Good air movement, clear to auscultation bilaterally, no use of accessory muscles.  Cardiac: RRR, normal S1, S2, no Murmurs. Vascular:  Right carotid bruit Vessel Right Left  Radial Palpable Palpable  Ulnar Palpable Palpable  Brachial Palpable Palpable  Carotid Palpable Palpable  Gastrointestinal: soft, non-distended. No guarding/no peritoneal signs.  Musculoskeletal: M/S 5/5 throughout.  No deformity or atrophy.  Neurologic: CN 2-12 intact. Pain and light touch intact in extremities.  Symmetrical.  Speech is fluent. Motor exam as listed above. Psychiatric: Judgment intact, Mood & affect appropriate for pt's clinical situation. Dermatologic: No rashes or ulcers noted.  No changes consistent with cellulitis. Lymph : No Cervical lymphadenopathy, no lichenification or skin changes of chronic lymphedema.  CBC Lab Results  Component Value Date   WBC 6.6 07/28/2016   HGB 12.2 07/28/2016   HCT 36.7 07/28/2016   MCV 81.4 07/28/2016   PLT 231 07/28/2016    BMET    Component Value Date/Time   NA 142 07/27/2016 0414   NA 143 12/31/2013 0406   K 3.9 07/27/2016 0414   K 4.5 12/31/2013 0406   CL 107 07/27/2016 0414   CL 111 (H) 12/31/2013 0406   CO2 29 07/27/2016 0414   CO2 28 12/31/2013  0406   GLUCOSE 93 07/27/2016 0414   GLUCOSE 115 (H) 12/31/2013 0406   BUN 12 07/27/2016 0414   BUN 10 12/31/2013 0406   CREATININE 0.77 07/27/2016 0414   CREATININE 0.87 12/31/2013 0406   CALCIUM 8.9 07/27/2016 0414   CALCIUM 8.6 12/31/2013 0406   GFRNONAA >60 07/27/2016 0414   GFRNONAA >60 12/31/2013 0406   GFRAA >60 07/27/2016 0414   GFRAA >60 12/31/2013 0406   CrCl cannot be calculated (Patient's most recent lab result is older than the maximum 21 days allowed.).  COAG No results found for: INR, PROTIME  Radiology Ct Abdomen Pelvis W Contrast  Result Date: 08/21/2016 CLINICAL DATA:  Generalized abdominal pain with progressive left lower quadrant pain and diarrhea. EXAM: CT ABDOMEN AND PELVIS WITH CONTRAST TECHNIQUE: Multidetector CT imaging of the abdomen and pelvis was performed using the standard protocol following bolus administration of intravenous contrast. CONTRAST:  49m ISOVUE-300 IOPAMIDOL (ISOVUE-300) INJECTION 61% COMPARISON:  CT scan dated 07/26/2016 FINDINGS: Lower chest: Benign stable 5.3 cm right pericardial cyst. Heart appears normal. Lung bases are clear. Hepatobiliary: No focal liver abnormality is seen. No gallstones, gallbladder wall thickening, or biliary dilatation. Prominent right lobe of the liver. Pancreas: Unremarkable. No pancreatic ductal dilatation or surrounding inflammatory changes. Spleen: Normal in size without focal abnormality. Adrenals/Urinary Tract: Bilateral peripelvic cysts in the kidneys without evidence of obstruction. Adrenal glands and bladder are normal. Stomach/Bowel: The appendicitis noted on the prior study has completely resolved. The appendix now partially fills with contrast in the periappendiceal inflammation has completely resolved. The bowel now appears normal except for scattered diverticula in the distal colon. Vascular/Lymphatic: Extensive aortic atherosclerosis. Reproductive: Uterus has been removed. There appear to be small normal  appearing ovaries. Other: No abdominal wall hernia or abnormality. No abdominopelvic ascites. Musculoskeletal: No acute or significant osseous findings. IMPRESSION: 1. Resolution of appendicitis. 2. Diverticulosis of the distal colon.  No acute abnormalities. 3. Aortic atherosclerosis. Electronically Signed   By: JLorriane ShireM.D.  On: 08/21/2016 14:19   Mm Digital Screening Bilateral  Result Date: 08/22/2016 CLINICAL DATA:  Screening. EXAM: DIGITAL SCREENING BILATERAL MAMMOGRAM WITH CAD COMPARISON:  Previous exam(s). ACR Breast Density Category c: The breast tissue is heterogeneously dense, which may obscure small masses. FINDINGS: There are no findings suspicious for malignancy. Images were processed with CAD. IMPRESSION: No mammographic evidence of malignancy. A result letter of this screening mammogram will be mailed directly to the patient. RECOMMENDATION: Screening mammogram in one year. (Code:SM-B-01Y) BI-RADS CATEGORY  1: Negative. Electronically Signed   By: Abelardo Diesel M.D.   On: 08/22/2016 15:08    Assessment/Plan 1. Bilateral carotid artery stenosis Recommend:  Given the patient's asymptomatic subcritical stenosis no further invasive testing or surgery at this time.  Duplex ultrasound shows 09-92% RICA, and <78% LICA stenosis.  Continue antiplatelet therapy as prescribed Continue management of CAD, HTN and Hyperlipidemia Healthy heart diet,  encouraged exercise at least 4 times per week Follow up in 6 months with duplex ultrasound and physical exam based on >50% stenosis of the RICA carotid artery   - VAS US CAROTID; Future  2. Benign hypertension Continue antihypertensive medications as already ordered, these medications have been reviewed and there are no changes at this time.  3. Arteriosclerosis of coronary artery Continue cardiac and antihypertensive medications as already ordered and reviewed, no changes at this time.  Continue statin as ordered and reviewed, no  changes at this time  Nitrates PRN for chest pain  4. Mixed hyperlipidemia Continue statin as ordered and reviewed, no changes at this time     Hortencia Pilar, MD  09/11/2016 1:07 PM

## 2016-11-17 ENCOUNTER — Inpatient Hospital Stay: Payer: Medicare Other | Attending: Internal Medicine | Admitting: Internal Medicine

## 2016-11-17 DIAGNOSIS — Z87891 Personal history of nicotine dependence: Secondary | ICD-10-CM | POA: Diagnosis not present

## 2016-11-17 DIAGNOSIS — Z9223 Personal history of estrogen therapy: Secondary | ICD-10-CM | POA: Diagnosis not present

## 2016-11-17 DIAGNOSIS — M1990 Unspecified osteoarthritis, unspecified site: Secondary | ICD-10-CM | POA: Diagnosis not present

## 2016-11-17 DIAGNOSIS — Z87442 Personal history of urinary calculi: Secondary | ICD-10-CM | POA: Insufficient documentation

## 2016-11-17 DIAGNOSIS — K219 Gastro-esophageal reflux disease without esophagitis: Secondary | ICD-10-CM | POA: Diagnosis not present

## 2016-11-17 DIAGNOSIS — I2510 Atherosclerotic heart disease of native coronary artery without angina pectoris: Secondary | ICD-10-CM | POA: Insufficient documentation

## 2016-11-17 DIAGNOSIS — Z955 Presence of coronary angioplasty implant and graft: Secondary | ICD-10-CM | POA: Insufficient documentation

## 2016-11-17 DIAGNOSIS — Z7982 Long term (current) use of aspirin: Secondary | ICD-10-CM | POA: Diagnosis not present

## 2016-11-17 DIAGNOSIS — Z170 Estrogen receptor positive status [ER+]: Secondary | ICD-10-CM | POA: Diagnosis not present

## 2016-11-17 DIAGNOSIS — E785 Hyperlipidemia, unspecified: Secondary | ICD-10-CM | POA: Insufficient documentation

## 2016-11-17 DIAGNOSIS — Z79899 Other long term (current) drug therapy: Secondary | ICD-10-CM | POA: Insufficient documentation

## 2016-11-17 DIAGNOSIS — Z9221 Personal history of antineoplastic chemotherapy: Secondary | ICD-10-CM | POA: Diagnosis not present

## 2016-11-17 DIAGNOSIS — Z853 Personal history of malignant neoplasm of breast: Secondary | ICD-10-CM

## 2016-11-17 DIAGNOSIS — C50812 Malignant neoplasm of overlapping sites of left female breast: Secondary | ICD-10-CM

## 2016-11-17 NOTE — Progress Notes (Signed)
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 Patient denies new lumps or bumps.  She just returned from [place]. Patient denies any unusual shortness of breath or cough or unusual bone pain. Her appetite is fair. No unusual weight loss. No nausea no vomiting. Chronic tingling and numbness in the feet; This is not any worse.   REVIEW OF SYSTEMS:  A complete 10 point review of system is done which is negative except mentioned above/history of present illness.   PAST MEDICAL HISTORY :  Past Medical History:  Diagnosis Date  . Arthritis   . Breast cancer (HCC) 2009    2009 breast cancer stage IIA T2 N0, ER positive, HER-2 negative, 2.5 cm primary tumor, poorly differentiated, status post wide local excision, one sentinel lymph node negative, high-risk Oncotype score 48  . Cataracts, both eyes   . Colitis   . Coronary artery disease   . Detached vitreous humor   . Diverticulitis   . 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 Gastritis   . GERD (gastroesophageal reflux disease)   . 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 Hemorrhoid   . Hemorrhoids   . Hyperlipemia   . 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 Kidney cysts    Left  . Kidney stones   . Lipoma of shoulder 2009   left  . Migraine   . Osteoarthritis   . Osteopenia   . Stented coronary  artery     PAST SURGICAL HISTORY :   Past Surgical History:  Procedure Laterality Date  . ABDOMINAL HYSTERECTOMY    . benign breast biopsy  2007  . BREAST BIOPSY Left 2007   positive  . BREAST CYST ASPIRATION Right    neg  . BREAST SURGERY    . CARDIAC CATHETERIZATION N/A 06/22/2015   Procedure: Left Heart Cath and Coronary Angiography;  Surgeon: Kenneth A Fath, MD;  Location: [facility] INVASIVE CV LAB;  Service: Cardiovascular;  Laterality: N/A;  . CARDIAC CATHETERIZATION    . CATARACT EXTRACTION    . COLONOSCOPY    . CORONARY ANGIOPLASTY    . EYE SURGERY    . FOOT FRACTURE SURGERY Left   . PARTIAL HYSTERECTOMY    . UPPER GASTROINTESTINAL ENDOSCOPY      FAMILY HISTORY :   Family History  Problem Relation Age of Onset  . Heart disease Mother   . Diabetes Father   . Heart disease Father   . Diabetes Sister   . Diabetes Brother   . Cervical cancer Cousin   . Breast cancer Cousin   . Lung cancer Paternal Aunt   . Prostate cancer Maternal Uncle   . Leukemia    . Hypertension      SOCIAL HISTORY:   Social History  Substance Use Topics  .   Smoking status: Former Smoker    Types: Cigarettes    Quit date: 08/14/1993  . Smokeless tobacco: Never Used  . Alcohol use No    ALLERGIES:  is allergic to alendronate; alendronate sodium; atorvastatin; azithromycin; ciprofloxacin; colestipol; lidocaine; lovastatin; metronidazole; niacin; risedronate; risedronate sodium; rosuvastatin; and simvastatin.  MEDICATIONS:  Current Outpatient Prescriptions  Medication Sig Dispense Refill  . ALPRAZolam (XANAX) 0.25 MG tablet TAKE ONE-HALF TABLET BY MOUTH AT BEDTIME    . aspirin EC 81 MG tablet Take by mouth.    . Azelaic Acid 15 % cream Apply topically. Reported on 11/18/2015    . butalbital-aspirin-caffeine (FIORINAL) 50-325-40 MG per capsule Take by mouth.    . calcium carbonate (TUMS - DOSED IN MG ELEMENTAL CALCIUM) 500 MG chewable tablet Chew by mouth.    . Cholecalciferol (VITAMIN D-1000  MAX ST) 1000 UNITS tablet Take by mouth.    . docusate sodium (STOOL SOFTENER) 100 MG capsule Take by mouth.    . ezetimibe (ZETIA) 10 MG tablet Take by mouth.    . gabapentin (NEURONTIN) 300 MG capsule TAKE ONE CAPSULE BY MOUTH THREE TIMES DAILY    . metoprolol succinate (TOPROL-XL) 25 MG 24 hr tablet Take by mouth.    . Multiple Vitamin (MULTI-VITAMINS) TABS Take by mouth.    . omeprazole (PRILOSEC) 20 MG capsule Take 20 mg by mouth 2 (two) times daily before a meal.    . amoxicillin-clavulanate (AUGMENTIN) 875-125 MG tablet Take 1 tablet by mouth 2 (two) times daily. (Patient not taking: Reported on 11/17/2016) 24 tablet 0  . donepezil (ARICEPT) 5 MG tablet     . vancomycin (VANCOCIN) 125 MG capsule      No current facility-administered medications for this visit.     PHYSICAL EXAMINATION: ECOG PERFORMANCE STATUS: 0 - Asymptomatic  BP (!) 146/67 (BP Location: Right Arm, Patient Position: Sitting)   Pulse (!) 58   Temp (!) 96.6 F (35.9 C) (Tympanic)   Resp 18   Wt 97 lb 2 oz (44.1 kg)   BMI 17.76 kg/m   Filed Weights   11/17/16 1023  Weight: 97 lb 2 oz (44.1 kg)    GENERAL: Well-nourished well-developed; Alert, no distress and comfortable.  Thin built. She is accompanied by her husband. EYES: no pallor or icterus OROPHARYNX: no thrush or ulceration; good dentition  NECK: supple, no masses felt LYMPH:  no palpable lymphadenopathy in the cervical, axillary or inguinal regions LUNGS: clear to auscultation and  No wheeze or crackles HEART/CVS: regular rate & rhythm and no murmurs; No lower extremity edema ABDOMEN:abdomen soft, non-tender and normal bowel sounds Musculoskeletal:no cyanosis of digits and no clubbing  PSYCH: alert & oriented x 3 with fluent speech NEURO: no focal motor/sensory deficits SKIN:  no rashes or significant lesions Breast exam [in presence of husband]- right breast- no skin changes no nipple changes no masses felt. Left breast- lumpectomy scar noted  otherwise no masses felt.  LABORATORY DATA:  I have reviewed the data as listed    Component Value Date/Time   NA 142 07/27/2016 0414   NA 143 12/31/2013 0406   K 3.9 07/27/2016 0414   K 4.5 12/31/2013 0406   CL 107 07/27/2016 0414   CL 111 (H) 12/31/2013 0406   CO2 29 07/27/2016 0414   CO2 28 12/31/2013 0406   GLUCOSE 93 07/27/2016 0414   GLUCOSE 115 (H) 12/31/2013 0406   BUN 12 07/27/2016 0414   BUN 10 12/31/2013 0406   CREATININE 0.77 07/27/2016 0414     CREATININE 0.87 12/31/2013 0406   CALCIUM 8.9 07/27/2016 0414   CALCIUM 8.6 12/31/2013 0406   GFRNONAA >60 07/27/2016 0414   GFRNONAA >60 12/31/2013 0406   GFRAA >60 07/27/2016 0414   GFRAA >60 12/31/2013 0406    No results found for: SPEP, UPEP  Lab Results  Component Value Date   WBC 6.6 07/28/2016   HGB 12.2 07/28/2016   HCT 36.7 07/28/2016   MCV 81.4 07/28/2016   PLT 231 07/28/2016      Chemistry      Component Value Date/Time   NA 142 07/27/2016 0414   NA 143 12/31/2013 0406   K 3.9 07/27/2016 0414   K 4.5 12/31/2013 0406   CL 107 07/27/2016 0414   CL 111 (H) 12/31/2013 0406   CO2 29 07/27/2016 0414   CO2 28 12/31/2013 0406   BUN 12 07/27/2016 0414   BUN 10 12/31/2013 0406   CREATININE 0.77 07/27/2016 0414   CREATININE 0.87 12/31/2013 0406      Component Value Date/Time   CALCIUM 8.9 07/27/2016 0414   CALCIUM 8.6 12/31/2013 0406     ASSESSMENT & PLAN:   Carcinoma of overlapping sites of left breast in female, estrogen receptor positive ([place]) # Breast cancer- stage II ER/PR positive HER-2/neu negative high risk Oncotype status post chemotherapy [2009]; adjuvant hormone therapy until 2012.   Clinically, based on history/review of system and physical findings; no evidence of any recurrence noted.  Mammogram 2018 Jan negative- reviewed the mammogram with the patient.  # Recent appendicitis s/p ABx- monitored. Resolved without surgery.   # Recommend follow-up in one year; mammogram will be ordered  through her PCP [Dr.Sparks]. She'll call us if she has any concerning signs and symptoms in between.  Discussed re: follow up with [place] vs PCP; prefers to keep follow up with [place].       Cammie Sickle, MD 11/17/2016 1:17 PM

## 2016-11-17 NOTE — Progress Notes (Signed)
Patient is here today for one year follow up.

## 2016-11-17 NOTE — Assessment & Plan Note (Addendum)
#  Breast cancer- stage II ER/PR positive HER-2/neu negative high risk Oncotype status post chemotherapy [2009]; adjuvant hormone therapy until 2012.   Clinically, based on history/review of system and physical findings; no evidence of any recurrence noted.  Mammogram 2018 Jan negative- reviewed the mammogram with the patient.  # Recent appendicitis s/p ABx- monitored. Resolved without surgery.   # Recommend follow-up in one year; mammogram will be ordered through her PCP [Dr.Sparks]. She'll call us if she has any concerning signs and symptoms in between.  Discussed re: follow up with [place] vs PCP; prefers to keep follow up with [place].

## 2016-11-21 ENCOUNTER — Encounter: Payer: Self-pay | Admitting: Obstetrics and Gynecology

## 2016-11-21 ENCOUNTER — Ambulatory Visit (INDEPENDENT_AMBULATORY_CARE_PROVIDER_SITE_OTHER): Payer: Medicare Other | Admitting: Obstetrics and Gynecology

## 2016-11-21 ENCOUNTER — Encounter: Payer: Medicare Other | Admitting: Obstetrics and Gynecology

## 2016-11-21 VITALS — BP 148/76 | HR 78 | Ht 62.0 in | Wt 94.4 lb

## 2016-11-21 DIAGNOSIS — R636 Underweight: Secondary | ICD-10-CM

## 2016-11-21 DIAGNOSIS — Z853 Personal history of malignant neoplasm of breast: Secondary | ICD-10-CM

## 2016-11-21 DIAGNOSIS — N993 Prolapse of vaginal vault after hysterectomy: Secondary | ICD-10-CM | POA: Diagnosis not present

## 2016-11-21 DIAGNOSIS — Z0000 Encounter for general adult medical examination without abnormal findings: Secondary | ICD-10-CM | POA: Diagnosis not present

## 2016-11-21 DIAGNOSIS — N952 Postmenopausal atrophic vaginitis: Secondary | ICD-10-CM

## 2016-11-21 NOTE — Patient Instructions (Signed)
Health Maintenance for Postmenopausal Women Menopause is a normal process in which your reproductive ability comes to an end. This process happens gradually over a span of months to years, usually between the ages of 33 and 38. Menopause is complete when you have missed 12 consecutive menstrual periods. It is important to talk with your health care provider about some of the most common conditions that affect postmenopausal women, such as heart disease, cancer, and bone loss (osteoporosis). Adopting a healthy lifestyle and getting preventive care can help to promote your health and wellness. Those actions can also lower your chances of developing some of these common conditions. What should I know about menopause? During menopause, you may experience a number of symptoms, such as:  Moderate-to-severe hot flashes.  Night sweats.  Decrease in sex drive.  Mood swings.  Headaches.  Tiredness.  Irritability.  Memory problems.  Insomnia. Choosing to treat or not to treat menopausal changes is an individual decision that you make with your health care provider. What should I know about hormone replacement therapy and supplements? Hormone therapy products are effective for treating symptoms that are associated with menopause, such as hot flashes and night sweats. Hormone replacement carries certain risks, especially as you become older. If you are thinking about using estrogen or estrogen with progestin treatments, discuss the benefits and risks with your health care provider. What should I know about heart disease and stroke? Heart disease, heart attack, and stroke become more likely as you age. This may be due, in part, to the hormonal changes that your body experiences during menopause. These can affect how your body processes dietary fats, triglycerides, and cholesterol. Heart attack and stroke are both medical emergencies. There are many things that you can do to help prevent heart disease  and stroke:  Have your blood pressure checked at least every 1-2 years. High blood pressure causes heart disease and increases the risk of stroke.  If you are 48-61 years old, ask your health care provider if you should take aspirin to prevent a heart attack or a stroke.  Do not use any tobacco products, including cigarettes, chewing tobacco, or electronic cigarettes. If you need help quitting, ask your health care provider.  It is important to eat a healthy diet and maintain a healthy weight.  Be sure to include plenty of vegetables, fruits, low-fat dairy products, and lean protein.  Avoid eating foods that are high in solid fats, added sugars, or salt (sodium).  Get regular exercise. This is one of the most important things that you can do for your health.  Try to exercise for at least 150 minutes each week. The type of exercise that you do should increase your heart rate and make you sweat. This is known as moderate-intensity exercise.  Try to do strengthening exercises at least twice each week. Do these in addition to the moderate-intensity exercise.  Know your numbers.Ask your health care provider to check your cholesterol and your blood glucose. Continue to have your blood tested as directed by your health care provider. What should I know about cancer screening? There are several types of cancer. Take the following steps to reduce your risk and to catch any cancer development as early as possible. Breast Cancer  Practice breast self-awareness.  This means understanding how your breasts normally appear and feel.  It also means doing regular breast self-exams. Let your health care provider know about any changes, no matter how small.  If you are 40 or older,  have a clinician do a breast exam (clinical breast exam or CBE) every year. Depending on your age, family history, and medical history, it may be recommended that you also have a yearly breast X-ray (mammogram).  If you  have a family history of breast cancer, talk with your health care provider about genetic screening.  If you are at high risk for breast cancer, talk with your health care provider about having an MRI and a mammogram every year.  Breast cancer (BRCA) gene test is recommended for women who have family members with BRCA-related cancers. Results of the assessment will determine the need for genetic counseling and BRCA1 and for BRCA2 testing. BRCA-related cancers include these types:  Breast. This occurs in males or females.  Ovarian.  Tubal. This may also be called fallopian tube cancer.  Cancer of the abdominal or pelvic lining (peritoneal cancer).  Prostate.  Pancreatic. Cervical, Uterine, and Ovarian Cancer  Your health care provider may recommend that you be screened regularly for cancer of the pelvic organs. These include your ovaries, uterus, and vagina. This screening involves a pelvic exam, which includes checking for microscopic changes to the surface of your cervix (Pap test).  For women ages 21-65, health care providers may recommend a pelvic exam and a Pap test every three years. For women ages 23-65, they may recommend the Pap test and pelvic exam, combined with testing for human papilloma virus (HPV), every five years. Some types of HPV increase your risk of cervical cancer. Testing for HPV may also be done on women of any age who have unclear Pap test results.  Other health care providers may not recommend any screening for nonpregnant women who are considered low risk for pelvic cancer and have no symptoms. Ask your health care provider if a screening pelvic exam is right for you.  If you have had past treatment for cervical cancer or a condition that could lead to cancer, you need Pap tests and screening for cancer for at least 20 years after your treatment. If Pap tests have been discontinued for you, your risk factors (such as having a new sexual partner) need to be reassessed  to determine if you should start having screenings again. Some women have medical problems that increase the chance of getting cervical cancer. In these cases, your health care provider may recommend that you have screening and Pap tests more often.  If you have a family history of uterine cancer or ovarian cancer, talk with your health care provider about genetic screening.  If you have vaginal bleeding after reaching menopause, tell your health care provider.  There are currently no reliable tests available to screen for ovarian cancer. Lung Cancer  Lung cancer screening is recommended for adults 99-83 years old who are at high risk for lung cancer because of a history of smoking. A yearly low-dose CT scan of the lungs is recommended if you:  Currently smoke.  Have a history of at least 30 pack-years of smoking and you currently smoke or have quit within the past 15 years. A pack-year is smoking an average of one pack of cigarettes per day for one year. Yearly screening should:  Continue until it has been 15 years since you quit.  Stop if you develop a health problem that would prevent you from having lung cancer treatment. Colorectal Cancer  This type of cancer can be detected and can often be prevented.  Routine colorectal cancer screening usually begins at age 72 and continues  through age 26.  If you have risk factors for colon cancer, your health care provider may recommend that you be screened at an earlier age.  If you have a family history of colorectal cancer, talk with your health care provider about genetic screening.  Your health care provider may also recommend using home test kits to check for hidden blood in your stool.  A small camera at the end of a tube can be used to examine your colon directly (sigmoidoscopy or colonoscopy). This is done to check for the earliest forms of colorectal cancer.  Direct examination of the colon should be repeated every 5-10 years until  age 29. However, if early forms of precancerous polyps or small growths are found or if you have a family history or genetic risk for colorectal cancer, you may need to be screened more often. Skin Cancer  Check your skin from head to toe regularly.  Monitor any moles. Be sure to tell your health care provider:  About any new moles or changes in moles, especially if there is a change in a mole's shape or color.  If you have a mole that is larger than the size of a pencil eraser.  If any of your family members has a history of skin cancer, especially at a young age, talk with your health care provider about genetic screening.  Always use sunscreen. Apply sunscreen liberally and repeatedly throughout the day.  Whenever you are outside, protect yourself by wearing long sleeves, pants, a wide-brimmed hat, and sunglasses. What should I know about osteoporosis? Osteoporosis is a condition in which bone destruction happens more quickly than new bone creation. After menopause, you may be at an increased risk for osteoporosis. To help prevent osteoporosis or the bone fractures that can happen because of osteoporosis, the following is recommended:  If you are 13-75 years old, get at least 1,000 mg of calcium and at least 600 mg of vitamin D per day.  If you are older than age 21 but younger than age 59, get at least 1,200 mg of calcium and at least 600 mg of vitamin D per day.  If you are older than age 59, get at least 1,200 mg of calcium and at least 800 mg of vitamin D per day. Smoking and excessive alcohol intake increase the risk of osteoporosis. Eat foods that are rich in calcium and vitamin D, and do weight-bearing exercises several times each week as directed by your health care provider. What should I know about how menopause affects my mental health? Depression may occur at any age, but it is more common as you become older. Common symptoms of depression include:  Low or sad  mood.  Changes in sleep patterns.  Changes in appetite or eating patterns.  Feeling an overall lack of motivation or enjoyment of activities that you previously enjoyed.  Frequent crying spells. Talk with your health care provider if you think that you are experiencing depression. What should I know about immunizations? It is important that you get and maintain your immunizations. These include:  Tetanus, diphtheria, and pertussis (Tdap) booster vaccine.  Influenza every year before the flu season begins.  Pneumonia vaccine.  Shingles vaccine. Your health care provider may also recommend other immunizations. This information is not intended to replace advice given to you by your health care provider. Make sure you discuss any questions you have with your health care provider. Document Released: 09/22/2005 Document Revised: 02/18/2016 Document Reviewed: 05/04/2015 [employer] Interactive Patient  Education  2017 [employer]  

## 2016-11-21 NOTE — Progress Notes (Signed)
ANNUAL PREVENTATIVE CARE GYNECOLOGY  ENCOUNTER NOTE  Subjective:       Kathleen Walls is a 76 y.o. G83P2002 female with a h/o breast cancerhere for a routine annual gynecologic exam. The patient is sexually active. The patient is not taking hormone replacement therapy. Patient denies post-menopausal vaginal bleeding. The patient wears seatbelts: yes. The patient participates in regular exercise: no. Has the patient ever been transfused or tattooed?: no. The patient reports that there is not domestic violence in her life.    Gynecologic History No LMP recorded. Patient has had a hysterectomy. Contraception: status post hysterectomy Last Pap: 08/2012, normal.  Last mammogram: 08/2016. Results were: BIRADS 1-Negative Last Colonoscopy: 2009. Diverticulosis in right sigmoid colon found.  Last Dexa Scan: 3 years ago   Obstetric History OB History  Gravida Para Term Preterm AB Living  '2 2 2        '$ SAB TAB Ectopic Multiple Live Births               # Outcome Date GA Lbr Len/2nd Weight Sex Delivery Anes PTL Lv  2 Term 1966 [redacted]w[redacted]d  M Vag-Spont     1 Term [id number] M Vag-Spont         Past Medical History:  Diagnosis Date  . Arthritis   . [facility]    2009 breast cancer stage IIA T2 N0, ER positive, HER-2 negative, 2.5 cm primary tumor, poorly differentiated, status post wide local excision, one sentinel lymph node negative, high-risk Oncotype score 48  . Cataracts, both eyes   . Colitis   . Coronary artery disease   . Detached vitreous humor   . Diverticulitis   . 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 Gastritis   . GERD (gastroesophageal reflux disease)   . 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 Hemorrhoid   . Hemorrhoids   . Hyperlipemia   . 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 Kidney cysts    Left  . Kidney stones   . Lipoma of shoulder 2009   left  . Migraine   . Osteoarthritis   . Osteopenia   . Stented  coronary artery     Family History  Problem Relation Age of Onset  . Heart disease Mother   . Diabetes Father   . Heart disease Father   . Diabetes Sister   . Diabetes Brother   . Cervical cancer Cousin   . Breast cancer Cousin   . Lung cancer Paternal Aunt   . Prostate cancer Maternal Uncle   . Leukemia    . Hypertension      Past Surgical History:  Procedure Laterality Date  . ABDOMINAL HYSTERECTOMY    . benign breast biopsy  2007  . BREAST BIOPSY Left 2007   positive  . BREAST CYST ASPIRATION Right    neg  . BREAST SURGERY    . CARDIAC CATHETERIZATION N/A 06/22/2015   Procedure: Left Heart Cath and Coronary Angiography;  Surgeon: KeTeodoro SprayMD;  [facility];  Service: Cardiovascular;  Laterality: N/A;  . CARDIAC CATHETERIZATION    . CATARACT EXTRACTION    . COLONOSCOPY    . CORONARY ANGIOPLASTY    . EYE SURGERY    . FOOT FRACTURE SURGERY Left   . PARTIAL HYSTERECTOMY    . UPPER GASTROINTESTINAL ENDOSCOPY      Social History  Social History  . Marital status: Married    Spouse name: N/A  . Number of children: N/A  . Years of education: N/A   Occupational History  . Not on file.   Social History Main Topics  . Smoking status: Former Smoker    Types: Cigarettes    Quit date: 08/14/1993  . Smokeless tobacco: Never Used  . Alcohol use No  . Drug use: No  . Sexual activity: Yes    Birth control/ protection: None, Post-menopausal   Other Topics Concern  . Not on file   Social History Narrative  . No narrative on file    Current Outpatient Prescriptions on File Prior to Visit  Medication Sig Dispense Refill  . ALPRAZolam (XANAX) 0.25 MG tablet TAKE ONE-HALF TABLET BY MOUTH AT BEDTIME    . aspirin EC 81 MG tablet Take by mouth.    . butalbital-aspirin-caffeine (FIORINAL) 50-325-40 MG per capsule Take by mouth.    . calcium carbonate (TUMS - DOSED IN MG ELEMENTAL CALCIUM) 500 MG chewable tablet Chew by mouth.    . Cholecalciferol  (VITAMIN D-1000 MAX ST) 1000 UNITS tablet Take by mouth.    . docusate sodium (STOOL SOFTENER) 100 MG capsule Take by mouth.    . donepezil (ARICEPT) 5 MG tablet     . ezetimibe (ZETIA) 10 MG tablet Take by mouth.    . gabapentin (NEURONTIN) 300 MG capsule TAKE ONE CAPSULE BY MOUTH THREE TIMES DAILY    . metoprolol succinate (TOPROL-XL) 25 MG 24 hr tablet Take by mouth.    . Multiple Vitamin (MULTI-VITAMINS) TABS Take by mouth.    Marland Kitchen omeprazole (PRILOSEC) 20 MG capsule Take 20 mg by mouth 2 (two) times daily before a meal.    . amoxicillin-clavulanate (AUGMENTIN) 875-125 MG tablet Take 1 tablet by mouth 2 (two) times daily. (Patient not taking: Reported on 11/17/2016) 24 tablet 0  . Azelaic Acid 15 % cream Apply topically. Reported on 11/18/2015    . vancomycin (VANCOCIN) 125 MG capsule      No current facility-administered medications on file prior to visit.     Allergies  Allergen Reactions  . Alendronate     Other reaction(s): Unknown  . Alendronate Sodium     Other reaction(s): Unknown  . Atorvastatin     Other reaction(s): Muscle Pain Other reaction(s): Muscle Pain  . Azithromycin Hives  . Ciprofloxacin     Other reaction(s): Headache  . Colestipol     Other reaction(s): Unknown  . Lidocaine Hives  . Lovastatin     Other reaction(s): Headache Other reaction(s): Headache  . Metronidazole     Other reaction(s): Headache  . Niacin     Other reaction(s): Unknown  . Risedronate     Other reaction(s): Unknown  . Risedronate Sodium Other (See Comments)  . Rosuvastatin     Other reaction(s): Unknown Other reaction(s): Unknown  . Simvastatin     Other reaction(s): Headache Other reaction(s): Headache     Review of Systems ROS Review of Systems - General ROS: negative for - chills, fatigue, fever, hot flashes, night sweats, weight gain or weight loss Psychological ROS: negative for - anxiety, decreased libido, depression, mood swings, physical abuse or sexual  abuse Ophthalmic ROS: negative for - blurry vision, eye pain or loss of vision ENT ROS: negative for - headaches, hearing change, visual changes or vocal changes Allergy and Immunology ROS: negative for - hives, itchy/watery eyes or seasonal allergies Hematological and Lymphatic ROS: negative for -  bleeding problems, bruising, swollen lymph nodes or weight loss Endocrine ROS: negative for - galactorrhea, hair pattern changes, hot flashes, malaise/lethargy, mood swings, palpitations, polydipsia/polyuria, skin changes, temperature intolerance or unexpected weight changes Breast ROS: negative for - new or changing breast lumps or nipple discharge Respiratory ROS: negative for - cough or shortness of breath Cardiovascular ROS: negative for - chest pain, irregular heartbeat, palpitations or shortness of breath Gastrointestinal ROS: no abdominal pain, change in bowel habits, or black or bloody stools Genito-Urinary ROS: no dysuria, trouble voiding, or hematuria Musculoskeletal ROS: negative for - joint pain or joint stiffness Neurological ROS: negative for - bowel and bladder control changes Dermatological ROS: negative for rash and skin lesion changes   Objective:   BP (!) 148/76   Pulse 78   Ht '5\' 2"'$  (1.575 m)   Wt 94 lb 6.4 oz (42.8 kg)   BMI 17.27 kg/m  CONSTITUTIONAL: Well-developed, well-nourished female in no acute distress.  PSYCHIATRIC: Normal mood and affect. Normal behavior. Normal judgment and thought content. [place]: Alert and oriented to person, place, and time. Normal muscle tone coordination. No cranial nerve deficit noted. HENT:  Normocephalic, atraumatic, External right and left ear normal. Oropharynx is clear and moist EYES: Conjunctivae and EOM are normal. Pupils are equal, round, and reactive to light. No scleral icterus.  NECK: Normal range of motion, supple, no masses.  Normal thyroid.  SKIN: Skin is warm and dry. No rash noted. Not diaphoretic. No erythema. No  pallor. CARDIOVASCULAR: Normal heart rate noted, regular rhythm, no murmur. RESPIRATORY: Clear to auscultation bilaterally. Effort and breath sounds normal, no problems with respiration noted. BREASTS: Symmetric in size. No masses, skin changes, nipple drainage, or lymphadenopathy. ABDOMEN: Soft, normal bowel sounds, no distention noted.  No tenderness, rebound or guarding.  BLADDER: Normal PELVIC:  Bladder no bladder distension noted  Urethra: normal appearing urethra with no masses, tenderness or lesions (small caruncle noted)  Vulva: normal appearing vulva with no masses, tenderness or lesions  Vagina: atrophic and Pelvic Floor Exam cystocele Grade 1-2, rectocele Grade 1, vaginal prolapse Grade 2  Cervix: not indicated and surgically absent  Uterus: not indicated and surgically absent, vaginal cuff well healed  Adnexa: normal adnexa in size, nontender and no masses  RV: External Exam NormaI, No Rectal Masses and Normal Sphincter tone  MUSCULOSKELETAL: Normal range of motion. No tenderness.  No cyanosis, clubbing, or edema.  2+ distal pulses. LYMPHATIC: No Axillary, Supraclavicular, or Inguinal Adenopathy.   Labs:  Lab Results  Component Value Date   WBC 6.6 07/28/2016   HGB 12.2 07/28/2016   HCT 36.7 07/28/2016   MCV 81.4 07/28/2016   PLT 231 07/28/2016    Lab Results  Component Value Date   CREATININE 0.77 07/27/2016   BUN 12 07/27/2016   NA 142 07/27/2016   K 3.9 07/27/2016   CL 107 07/27/2016   CO2 29 07/27/2016    No results found for: ALT, AST, GGT, ALKPHOS, BILITOT   No results found for: CHOL, HDL, LDLCALC, LDLDIRECT, TRIG, CHOLHDL  Lab Results  Component Value Date   TSH 1.70 12/31/2013     Assessment:   Annual gynecologic examination 76 y.o. Underweight, BMI 17 Vaginal vault prolapse (cystocele and rectocele) Vaginal atrophy H/o breast cancer  Plan:  Pap: Not needed.  Patient beyond age 58, and h/o hysterectomy.  Mammogram: completed 08/2016.  Benign. Patient for repeat mammogram next year.  Stool Guaiac Testing:  Not Ordered.  Has h/o Colonoscopy in 2009.  Labs: Not  ordered.  Up to date Routine preventative health maintenance measures emphasized: Exercise/Diet/Weight control, Alcohol/Substance use risks and Stress Management.  Empahsized Calcium and Vit D supplementation.    Underweight, notes healthy diet, normal appetite.  Patient with petite frame.  Can increase protein intake, or drink Boost for supplementation.  Vaginal vault prolapse - patient relatively asymptomatic (no bowel or urinary disturbances, incontinence, vaginal bulge).  No treatment desired currently.  Vaginal atrophy - patient without complaints, no intervention necessary currently.    Return to [place], MD  [facility]

## 2017-07-16 ENCOUNTER — Other Ambulatory Visit: Payer: Self-pay | Admitting: Internal Medicine

## 2017-07-16 DIAGNOSIS — Z1231 Encounter for screening mammogram for malignant neoplasm of breast: Secondary | ICD-10-CM

## 2017-08-23 ENCOUNTER — Ambulatory Visit
Admission: RE | Admit: 2017-08-23 | Discharge: 2017-08-23 | Disposition: A | Discharge status: Home / Self Care | Payer: Medicare Other | Source: Ambulatory Visit | Attending: Internal Medicine | Admitting: Internal Medicine

## 2017-08-23 DIAGNOSIS — Z1231 Encounter for screening mammogram for malignant neoplasm of breast: Secondary | ICD-10-CM | POA: Diagnosis not present

## 2017-08-27 ENCOUNTER — Encounter (INDEPENDENT_AMBULATORY_CARE_PROVIDER_SITE_OTHER): Payer: Self-pay | Admitting: Vascular Surgery

## 2017-08-27 ENCOUNTER — Ambulatory Visit (INDEPENDENT_AMBULATORY_CARE_PROVIDER_SITE_OTHER): Payer: Medicare Other | Admitting: Vascular Surgery

## 2017-08-27 ENCOUNTER — Ambulatory Visit (INDEPENDENT_AMBULATORY_CARE_PROVIDER_SITE_OTHER): Payer: Medicare Other

## 2017-08-27 VITALS — BP 123/64 | HR 52 | Resp 17 | Wt 95.8 lb

## 2017-08-27 DIAGNOSIS — I2510 Atherosclerotic heart disease of native coronary artery without angina pectoris: Secondary | ICD-10-CM | POA: Diagnosis not present

## 2017-08-27 DIAGNOSIS — I10 Essential (primary) hypertension: Secondary | ICD-10-CM | POA: Diagnosis not present

## 2017-08-27 DIAGNOSIS — I6523 Occlusion and stenosis of bilateral carotid arteries: Secondary | ICD-10-CM

## 2017-08-27 DIAGNOSIS — E782 Mixed hyperlipidemia: Secondary | ICD-10-CM

## 2017-08-27 NOTE — Progress Notes (Signed)
MRN : [id number]  Kathleen Walls is a 77 y.o. (1941-06-18) female who presents with chief complaint of  Chief Complaint  Patient presents with  . Carotid    6yrfollow up  .  History of Present Illness: The patient is seen for follow up evaluation of carotid stenosis. The carotid stenosis followed by ultrasound.   The patient denies amaurosis fugax. There is no recent history of TIA symptoms or focal motor deficits. There is no prior documented CVA.  The patient is taking enteric-coated aspirin 81 mg daily.  There is no history of migraine headaches. There is no history of seizures.  The patient has a history of coronary artery disease, no recent episodes of angina or shortness of breath. The patient denies PAD or claudication symptoms. There is a history of hyperlipidemia which is being treated with a statin.    Carotid Duplex done today shows RICA 601-02%and LICA <<72%  No change compared to last study in 09/11/2016  Current Meds  Medication Sig  . ALPRAZolam (XANAX) 0.25 MG tablet TAKE ONE-HALF TABLET BY MOUTH AT BEDTIME  . aspirin EC 81 MG tablet Take by mouth.  . calcium carbonate (TUMS - DOSED IN MG ELEMENTAL CALCIUM) 500 MG chewable tablet Chew by mouth.  . Cholecalciferol (VITAMIN D-1000 MAX ST) 1000 UNITS tablet Take by mouth.  . docusate sodium (STOOL SOFTENER) 100 MG capsule Take by mouth.  . donepezil (ARICEPT) 5 MG tablet   . ezetimibe (ZETIA) 10 MG tablet Take by mouth.  . gabapentin (NEURONTIN) 300 MG capsule TAKE ONE CAPSULE BY MOUTH THREE TIMES DAILY  . metoprolol succinate (TOPROL-XL) 25 MG 24 hr tablet Take by mouth.  . Multiple Vitamin (MULTI-VITAMINS) TABS Take by mouth.  .Marland Kitchenomeprazole (PRILOSEC) 20 MG capsule Take 20 mg by mouth 2 (two) times daily before a meal.    Past Medical History:  Diagnosis Date  . Arthritis   . Breast cancer ([facility] 2009    2009 breast cancer stage IIA T2 N0, ER positive, HER-2 negative, 2.5 cm primary tumor, poorly  differentiated, status post wide local excision, one sentinel lymph node negative, high-risk Oncotype score 48  . Cataracts, both eyes   . Colitis   . Coronary artery disease   . Detached vitreous humor   . Diverticulitis   . 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 Gastritis   . GERD (gastroesophageal reflux disease)   . 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 Hemorrhoid   . Hemorrhoids   . Hyperlipemia   . 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 Kidney cysts    Left  . Kidney stones   . Lipoma of shoulder 2009   left  . Migraine   . Osteoarthritis   . Osteopenia   . Stented coronary artery     Past Surgical History:  Procedure Laterality Date  . ABDOMINAL HYSTERECTOMY    . benign breast biopsy  2007  . BREAST BIOPSY Left 2007   positive  . BREAST CYST ASPIRATION Right    neg  . BREAST LUMPECTOMY Left 2009    2009 breast cancer stage IIA T2 N0, ER positive, HER-2 negative, 2.5 cm primary tumor, poorly differentiated, status post wide local excision, one sentinel lymph node negative, high-risk Oncotype score 48  . BREAST SURGERY    . CARDIAC CATHETERIZATION N/A 06/22/2015   Procedure: Left Heart Cath and Coronary Angiography;  Surgeon: KChrissie Noa  Ferd Hibbs, MD;  Location: [place] [facility];  Service: Cardiovascular;  Laterality: N/A;  . CARDIAC CATHETERIZATION    . CATARACT EXTRACTION    . COLONOSCOPY    . CORONARY ANGIOPLASTY    . EYE SURGERY    . FOOT FRACTURE SURGERY Left   . PARTIAL HYSTERECTOMY    . UPPER GASTROINTESTINAL ENDOSCOPY      Social History Social History   Tobacco Use  . Smoking status: Former Smoker    Types: Cigarettes    Last attempt to quit: 08/14/1993    Years since quitting: 24.0  . Smokeless tobacco: Never Used  Substance Use Topics  . Alcohol use: No    Alcohol/week: 0.0 oz  . Drug use: No    Family History Family History  Problem Relation Age of Onset  . Heart disease Mother   . Diabetes Father    . Heart disease Father   . Diabetes Sister   . Diabetes Brother   . Cervical cancer Cousin   . Breast cancer Cousin   . Lung cancer Paternal Aunt   . Prostate cancer Maternal Uncle   . Leukemia Unknown   . Hypertension Unknown     Allergies  Allergen Reactions  . Alendronate     Other reaction(s): Unknown  . Alendronate Sodium     Other reaction(s): Unknown  . Atorvastatin     Other reaction(s): Muscle Pain Other reaction(s): Muscle Pain  . Azithromycin Hives  . Ciprofloxacin     Other reaction(s): Headache  . Colestipol     Other reaction(s): Unknown  . Lidocaine Hives  . Lovastatin     Other reaction(s): Headache Other reaction(s): Headache  . Metronidazole     Other reaction(s): Headache  . Niacin     Other reaction(s): Unknown  . Risedronate     Other reaction(s): Unknown  . Risedronate Sodium Other (See Comments)  . Rosuvastatin     Other reaction(s): Unknown Other reaction(s): Unknown  . Simvastatin     Other reaction(s): Headache Other reaction(s): Headache     REVIEW OF SYSTEMS (Negative unless checked)  Constitutional: '[]'$ Weight loss  '[]'$ Fever  '[]'$ Chills Cardiac: '[]'$ Chest pain   '[]'$ Chest pressure   '[]'$ Palpitations   '[]'$ Shortness of breath when laying flat   '[]'$ Shortness of breath with exertion. Vascular:  '[]'$ Pain in legs with walking   '[]'$ Pain in legs at rest  '[]'$ History of DVT   '[]'$ Phlebitis   '[]'$ Swelling in legs   '[]'$ Varicose veins   '[]'$ Non-healing ulcers Pulmonary:   '[]'$ Uses home oxygen   '[]'$ Productive cough   '[]'$ Hemoptysis   '[]'$ Wheeze  '[]'$ COPD   '[]'$ Asthma Neurologic:  '[]'$ Dizziness   '[]'$ Seizures   '[]'$ History of stroke   '[]'$ History of TIA  '[]'$ Aphasia   '[]'$ Vissual changes   '[]'$ Weakness or numbness in arm   '[]'$ Weakness or numbness in leg Musculoskeletal:   '[]'$ Joint swelling   '[]'$ Joint pain   '[]'$ Low back pain Hematologic:  '[]'$ Easy bruising  '[]'$ Easy bleeding   '[]'$ Hypercoagulable state   '[]'$ Anemic Gastrointestinal:  '[]'$ Diarrhea   '[]'$ Vomiting  '[]'$ Gastroesophageal reflux/heartburn    '[]'$ Difficulty swallowing. Genitourinary:  '[]'$ Chronic kidney disease   '[]'$ Difficult urination  '[]'$ Frequent urination   '[]'$ Blood in urine Skin:  '[]'$ Rashes   '[]'$ Ulcers  Psychological:  '[]'$ History of anxiety   '[]'$  History of major depression.  Physical Examination  Vitals:   08/27/17 1008  BP: 123/64  Pulse: (!) 52  Resp: 17  Weight: 95 lb 12.8 oz (43.5 kg)   Body mass index is 17.52 kg/m. Gen: WD/WN,  NAD Head: [place]/AT, No temporalis wasting.  Ear/Nose/Throat: Hearing grossly intact, nares w/o erythema or drainage Eyes: PER, EOMI, sclera nonicteric.  Neck: Supple, no large masses.   Pulmonary:  Good air movement, no audible wheezing bilaterally, no use of accessory muscles.  Cardiac: RRR, no JVD Vascular:  Bilateral carotid bruit Vessel Right Left  Radial Palpable Palpable  Ulnar Palpable Palpable  Brachial Palpable Palpable  Carotid Palpable Palpable  Gastrointestinal: Non-distended. No guarding/no peritoneal signs.  Musculoskeletal: M/S 5/5 throughout.  No deformity or atrophy.  Neurologic: CN 2-12 intact. Symmetrical.  Speech is fluent. Motor exam as listed above. Psychiatric: Judgment intact, Mood & affect appropriate for pt's clinical situation. Dermatologic: No rashes or ulcers noted.  No changes consistent with cellulitis. Lymph : No lichenification or skin changes of chronic lymphedema.  CBC Lab Results  Component Value Date   WBC 6.6 07/28/2016   HGB 12.2 07/28/2016   HCT 36.7 07/28/2016   MCV 81.4 07/28/2016   PLT 231 07/28/2016    BMET    Component Value Date/Time   NA 142 07/27/2016 0414   NA 143 12/31/2013 0406   K 3.9 07/27/2016 0414   K 4.5 12/31/2013 0406   CL 107 07/27/2016 0414   CL 111 (H) 12/31/2013 0406   CO2 29 07/27/2016 0414   CO2 28 12/31/2013 0406   GLUCOSE 93 07/27/2016 0414   GLUCOSE 115 (H) 12/31/2013 0406   BUN 12 07/27/2016 0414   BUN 10 12/31/2013 0406   CREATININE 0.77 07/27/2016 0414   CREATININE 0.87 12/31/2013 0406   CALCIUM 8.9  07/27/2016 0414   CALCIUM 8.6 12/31/2013 0406   GFRNONAA >60 07/27/2016 0414   GFRNONAA >60 12/31/2013 0406   GFRAA >60 07/27/2016 0414   GFRAA >60 12/31/2013 0406   CrCl cannot be calculated (Patient's most recent lab result is older than the maximum 21 days allowed.).  COAG No results found for: INR, PROTIME  Radiology Mm Digital Screening Bilateral  Result Date: 08/23/2017 CLINICAL DATA:  Screening. EXAM: DIGITAL SCREENING BILATERAL MAMMOGRAM WITH CAD COMPARISON:  Previous exam(s). ACR Breast Density Category c: The breast tissue is heterogeneously dense, which may obscure small masses. FINDINGS: There are no findings suspicious for malignancy. Images were processed with CAD. IMPRESSION: No mammographic evidence of malignancy. A result letter of this screening mammogram will be mailed directly to the patient. RECOMMENDATION: Screening mammogram in one year. (Code:SM-B-01Y) BI-RADS CATEGORY  1: Negative. Electronically Signed   By: Curlene Dolphin M.D.   On: 08/23/2017 14:30    Assessment/Plan 1. Bilateral carotid artery stenosis Recommend:  Given the patient's asymptomatic subcritical stenosis no further invasive testing or surgery at this time.  Continue antiplatelet therapy as prescribed Continue management of CAD, HTN and Hyperlipidemia Healthy heart diet,  encouraged exercise at least 4 times per week Follow up in 12 months with duplex ultrasound and physical exam    2. Benign hypertension Continue antihypertensive medications as already ordered, these medications have been reviewed and there are no changes at this time.   3. Arteriosclerosis of coronary artery Continue cardiac and antihypertensive medications as already ordered and reviewed, no changes at this time.  Continue statin as ordered and reviewed, no changes at this time  Nitrates PRN for chest pain   4. Mixed hyperlipidemia Continue statin as ordered and reviewed, no changes at this time     Hortencia Pilar, MD  08/27/2017 10:14 AM

## 2017-09-02 ENCOUNTER — Encounter (INDEPENDENT_AMBULATORY_CARE_PROVIDER_SITE_OTHER): Payer: Self-pay | Admitting: Vascular Surgery

## 2017-11-16 ENCOUNTER — Encounter: Payer: Self-pay | Admitting: *Deleted

## 2017-11-16 ENCOUNTER — Other Ambulatory Visit: Payer: Self-pay

## 2017-11-16 ENCOUNTER — Inpatient Hospital Stay
Admission: EM | Admit: 2017-11-16 | Discharge: 2017-11-17 | DRG: 065 | Disposition: A | Discharge status: Home / Self Care | Payer: Medicare Other | Attending: Internal Medicine | Admitting: Internal Medicine

## 2017-11-16 ENCOUNTER — Inpatient Hospital Stay: Payer: Medicare Other

## 2017-11-16 ENCOUNTER — Emergency Department: Payer: Medicare Other

## 2017-11-16 ENCOUNTER — Observation Stay: Payer: Medicare Other

## 2017-11-16 DIAGNOSIS — Z79899 Other long term (current) drug therapy: Secondary | ICD-10-CM

## 2017-11-16 DIAGNOSIS — I6789 Other cerebrovascular disease: Secondary | ICD-10-CM | POA: Diagnosis not present

## 2017-11-16 DIAGNOSIS — I2510 Atherosclerotic heart disease of native coronary artery without angina pectoris: Secondary | ICD-10-CM | POA: Diagnosis present

## 2017-11-16 DIAGNOSIS — G459 Transient cerebral ischemic attack, unspecified: Secondary | ICD-10-CM | POA: Diagnosis present

## 2017-11-16 DIAGNOSIS — Z7982 Long term (current) use of aspirin: Secondary | ICD-10-CM

## 2017-11-16 DIAGNOSIS — I10 Essential (primary) hypertension: Secondary | ICD-10-CM | POA: Diagnosis not present

## 2017-11-16 DIAGNOSIS — G8194 Hemiplegia, unspecified affecting left nondominant side: Secondary | ICD-10-CM | POA: Diagnosis present

## 2017-11-16 DIAGNOSIS — Z8249 Family history of ischemic heart disease and other diseases of the circulatory system: Secondary | ICD-10-CM

## 2017-11-16 DIAGNOSIS — Z881 Allergy status to other antibiotic agents status: Secondary | ICD-10-CM | POA: Diagnosis not present

## 2017-11-16 DIAGNOSIS — I63411 Cerebral infarction due to embolism of right middle cerebral artery: Principal | ICD-10-CM | POA: Diagnosis present

## 2017-11-16 DIAGNOSIS — I6523 Occlusion and stenosis of bilateral carotid arteries: Secondary | ICD-10-CM | POA: Diagnosis present

## 2017-11-16 DIAGNOSIS — Z853 Personal history of malignant neoplasm of breast: Secondary | ICD-10-CM

## 2017-11-16 DIAGNOSIS — Z833 Family history of diabetes mellitus: Secondary | ICD-10-CM | POA: Diagnosis not present

## 2017-11-16 DIAGNOSIS — R29702 NIHSS score 2: Secondary | ICD-10-CM | POA: Diagnosis present

## 2017-11-16 DIAGNOSIS — Z9849 Cataract extraction status, unspecified eye: Secondary | ICD-10-CM

## 2017-11-16 DIAGNOSIS — Z955 Presence of coronary angioplasty implant and graft: Secondary | ICD-10-CM

## 2017-11-16 DIAGNOSIS — E119 Type 2 diabetes mellitus without complications: Secondary | ICD-10-CM | POA: Diagnosis present

## 2017-11-16 DIAGNOSIS — Z888 Allergy status to other drugs, medicaments and biological substances status: Secondary | ICD-10-CM

## 2017-11-16 DIAGNOSIS — K219 Gastro-esophageal reflux disease without esophagitis: Secondary | ICD-10-CM | POA: Diagnosis present

## 2017-11-16 DIAGNOSIS — Z884 Allergy status to anesthetic agent status: Secondary | ICD-10-CM | POA: Diagnosis not present

## 2017-11-16 DIAGNOSIS — I639 Cerebral infarction, unspecified: Secondary | ICD-10-CM | POA: Diagnosis present

## 2017-11-16 DIAGNOSIS — G250 Essential tremor: Secondary | ICD-10-CM | POA: Diagnosis present

## 2017-11-16 DIAGNOSIS — Z90710 Acquired absence of both cervix and uterus: Secondary | ICD-10-CM | POA: Diagnosis not present

## 2017-11-16 DIAGNOSIS — Z87891 Personal history of nicotine dependence: Secondary | ICD-10-CM | POA: Diagnosis not present

## 2017-11-16 DIAGNOSIS — M1990 Unspecified osteoarthritis, unspecified site: Secondary | ICD-10-CM | POA: Diagnosis present

## 2017-11-16 DIAGNOSIS — M8580 Other specified disorders of bone density and structure, unspecified site: Secondary | ICD-10-CM | POA: Diagnosis present

## 2017-11-16 LAB — URINALYSIS, ROUTINE W REFLEX MICROSCOPIC
BACTERIA UA: NONE SEEN
BILIRUBIN URINE: NEGATIVE
Glucose, UA: NEGATIVE mg/dL
HGB URINE DIPSTICK: NEGATIVE
Ketones, ur: NEGATIVE mg/dL
Leukocytes, UA: NEGATIVE
NITRITE: NEGATIVE
PH: 7 (ref 5.0–8.0)
Protein, ur: NEGATIVE mg/dL
RBC / HPF: NONE SEEN RBC/hpf (ref 0–5)
Specific Gravity, Urine: 1.003 — ABNORMAL LOW (ref 1.005–1.030)

## 2017-11-16 LAB — COMPREHENSIVE METABOLIC PANEL
ALT: 28 U/L (ref 14–54)
AST: 30 U/L (ref 15–41)
Albumin: 4 g/dL (ref 3.5–5.0)
Alkaline Phosphatase: 76 U/L (ref 38–126)
Anion gap: 6 (ref 5–15)
BUN: 19 mg/dL (ref 6–20)
CHLORIDE: 105 mmol/L (ref 101–111)
CO2: 31 mmol/L (ref 22–32)
Calcium: 9.5 mg/dL (ref 8.9–10.3)
Creatinine, Ser: 0.9 mg/dL (ref 0.44–1.00)
GFR calc non Af Amer: >60 mL/min (ref >60)
Glucose, Bld: 107 mg/dL — ABNORMAL HIGH (ref 65–99)
POTASSIUM: 4.5 mmol/L (ref 3.5–5.1)
SODIUM: 142 mmol/L (ref 135–145)
Total Bilirubin: 0.5 mg/dL (ref 0.3–1.2)
Total Protein: 7.3 g/dL (ref 6.5–8.1)

## 2017-11-16 LAB — MRSA PCR SCREENING: MRSA by PCR: NEGATIVE

## 2017-11-16 LAB — URINE DRUG SCREEN, QUALITATIVE (ARMC ONLY)
Amphetamines, Ur Screen: NOT DETECTED
BARBITURATES, UR SCREEN: NOT DETECTED
Benzodiazepine, Ur Scrn: NOT DETECTED
COCAINE METABOLITE, UR ~~LOC~~: NOT DETECTED
Cannabinoid 50 Ng, Ur ~~LOC~~: NOT DETECTED
MDMA (Ecstasy)Ur Screen: NOT DETECTED
Methadone Scn, Ur: NOT DETECTED
Opiate, Ur Screen: NOT DETECTED
Phencyclidine (PCP) Ur S: NOT DETECTED
TRICYCLIC, UR SCREEN: NOT DETECTED

## 2017-11-16 LAB — DIFFERENTIAL
BASOS PCT: 0 %
Basophils Absolute: 0 10*3/uL (ref 0–0.1)
EOS ABS: 0.2 10*3/uL (ref 0–0.7)
EOS PCT: 2 %
Lymphocytes Relative: 21 %
Lymphs Abs: 1.5 10*3/uL (ref 1.0–3.6)
MONO ABS: 0.9 10*3/uL (ref 0.2–0.9)
Monocytes Relative: 13 %
NEUTROS ABS: 4.4 10*3/uL (ref 1.4–6.5)
Neutrophils Relative %: 64 %

## 2017-11-16 LAB — CBC
HCT: 44.2 % (ref 35.0–47.0)
Hemoglobin: 14.2 g/dL (ref 12.0–16.0)
MCH: 25.7 pg — AB (ref 26.0–34.0)
MCHC: 32 g/dL (ref 32.0–36.0)
MCV: 80.3 fL (ref 80.0–100.0)
PLATELETS: 237 10*3/uL (ref 150–440)
RBC: 5.5 MIL/uL — ABNORMAL HIGH (ref 3.80–5.20)
RDW: 15.6 % — AB (ref 11.5–14.5)
WBC: 7 10*3/uL (ref 3.6–11.0)

## 2017-11-16 LAB — TROPONIN I: Troponin I: <0.03 ng/mL (ref <0.03)

## 2017-11-16 LAB — ETHANOL

## 2017-11-16 LAB — PROTIME-INR
INR: 0.9
PROTHROMBIN TIME: 12.1 s (ref 11.4–15.2)

## 2017-11-16 LAB — GLUCOSE, CAPILLARY
Glucose-Capillary: 91 mg/dL (ref 65–99)
Glucose-Capillary: 85 mg/dL (ref 65–99)

## 2017-11-16 LAB — APTT: aPTT: 24 seconds (ref 24–36)

## 2017-11-16 MED ORDER — IOHEXOL 350 MG/ML SOLN
75.0000 mL | Freq: Once | INTRAVENOUS | Status: AC | PRN
  Administered 2017-11-16: 75 mL via INTRAVENOUS

## 2017-11-16 MED ORDER — DONEPEZIL HCL 5 MG PO TABS
5.0000 mg | ORAL_TABLET | Freq: Every day | ORAL | Status: DC
  Administered 2017-11-16: 5 mg via ORAL
  Filled 2017-11-16 (×2): qty 1

## 2017-11-16 MED ORDER — ASPIRIN 300 MG RE SUPP: 300.0000 mg | Freq: Every day | RECTAL | Status: DC

## 2017-11-16 MED ORDER — CLOPIDOGREL BISULFATE 75 MG PO TABS
75.0000 mg | ORAL_TABLET | Freq: Every day | ORAL | Status: DC
  Administered 2017-11-16 – 2017-11-17 (×2): 75 mg via ORAL
  Filled 2017-11-16 (×2): qty 1

## 2017-11-16 MED ORDER — EZETIMIBE 10 MG PO TABS
10.0000 mg | ORAL_TABLET | Freq: Every day | ORAL | Status: DC
  Administered 2017-11-16 – 2017-11-17 (×2): 10 mg via ORAL
  Filled 2017-11-16 (×2): qty 1

## 2017-11-16 MED ORDER — PANTOPRAZOLE SODIUM 40 MG PO TBEC
40.0000 mg | DELAYED_RELEASE_TABLET | Freq: Every day | ORAL | Status: DC
  Administered 2017-11-16: 40 mg via ORAL
  Filled 2017-11-16 (×2): qty 1

## 2017-11-16 MED ORDER — DOCUSATE SODIUM 100 MG PO CAPS: 100.0000 mg | ORAL_CAPSULE | Freq: Every day | ORAL | Status: DC | PRN

## 2017-11-16 MED ORDER — ASPIRIN 325 MG PO TABS
325.0000 mg | ORAL_TABLET | Freq: Every day | ORAL | Status: DC
  Administered 2017-11-16 – 2017-11-17 (×2): 325 mg via ORAL
  Filled 2017-11-16 (×2): qty 1

## 2017-11-16 MED ORDER — STROKE: EARLY STAGES OF RECOVERY BOOK: Freq: Once | Status: DC

## 2017-11-16 MED ORDER — AZELAIC ACID 15 % EX GEL: 1.0000 "application " | Freq: Every day | CUTANEOUS | Status: DC

## 2017-11-16 MED ORDER — ENOXAPARIN SODIUM 30 MG/0.3ML ~~LOC~~ SOLN
30.0000 mg | SUBCUTANEOUS | Status: DC
  Administered 2017-11-16: 30 mg via SUBCUTANEOUS
  Filled 2017-11-16: qty 0.3

## 2017-11-16 MED ORDER — ALPRAZOLAM 0.25 MG PO TABS
0.1250 mg | ORAL_TABLET | Freq: Every day | ORAL | Status: DC
  Administered 2017-11-16: 0.125 mg via ORAL
  Filled 2017-11-16: qty 1

## 2017-11-16 MED ORDER — CALCIUM CARBONATE ANTACID 500 MG PO CHEW
2.0000 | CHEWABLE_TABLET | Freq: Every day | ORAL | Status: DC
  Administered 2017-11-17: 400 mg via ORAL
  Filled 2017-11-16: qty 2

## 2017-11-16 MED ORDER — SODIUM CHLORIDE (HYPERTONIC) 5 % OP SOLN
1.0000 [drp] | OPHTHALMIC | Status: DC | PRN
  Filled 2017-11-16: qty 15

## 2017-11-16 MED ORDER — GABAPENTIN 300 MG PO CAPS
300.0000 mg | ORAL_CAPSULE | Freq: Three times a day (TID) | ORAL | Status: DC
  Administered 2017-11-16 – 2017-11-17 (×2): 300 mg via ORAL
  Filled 2017-11-16 (×2): qty 1

## 2017-11-16 MED ORDER — SENNOSIDES-DOCUSATE SODIUM 8.6-50 MG PO TABS: 1.0000 | ORAL_TABLET | Freq: Every evening | ORAL | Status: DC | PRN

## 2017-11-16 MED ORDER — BUTALBITAL-APAP-CAFFEINE 50-325-40 MG PO TABS
1.0000 | ORAL_TABLET | ORAL | Status: DC | PRN
  Filled 2017-11-16: qty 1

## 2017-11-16 MED ORDER — ASPIRIN 81 MG PO CHEW
324.0000 mg | CHEWABLE_TABLET | Freq: Once | ORAL | Status: AC
  Administered 2017-11-16: 324 mg via ORAL
  Filled 2017-11-16: qty 4

## 2017-11-16 MED ORDER — ASPIRIN EC 81 MG PO TBEC
81.0000 mg | DELAYED_RELEASE_TABLET | Freq: Every day | ORAL | Status: DC
  Filled 2017-11-16: qty 1

## 2017-11-16 MED ORDER — NITROGLYCERIN 0.4 MG SL SUBL: 0.4000 mg | SUBLINGUAL_TABLET | SUBLINGUAL | Status: DC | PRN

## 2017-11-16 MED ORDER — VITAMIN D 1000 UNITS PO TABS
1000.0000 [IU] | ORAL_TABLET | Freq: Every day | ORAL | Status: DC
  Administered 2017-11-17: 1000 [IU] via ORAL
  Filled 2017-11-16: qty 1

## 2017-11-16 NOTE — ED Notes (Signed)
Attempted to call CCU for report.  Bed assignment not ready.  Will attempt to call back in 30 minutes.

## 2017-11-16 NOTE — Consult Note (Signed)
Referring Physician: Gouru    Chief Complaint: Left hand weakness  HPI: Kathleen Walls is an 77 y.o. female who reports awakening in the middle of the night to use the bathroom and being at baseline.  When she attempted to get up this morning was unable to get out of bed and noted that her left hand was weak.  EMS was called at that time and patient was brought in for evaluation. Initial NIHSS of 2.  Date last known well: Date: 11/16/2017 Time last known well: Time: 01:00 tPA Given: No: Outside time window  Past Medical History:  Diagnosis Date  . Arthritis   . Breast cancer [facility]) 2009    2009 breast cancer stage IIA T2 N0, ER positive, HER-2 negative, 2.5 cm primary tumor, poorly differentiated, status post wide local excision, one sentinel lymph node negative, high-risk Oncotype score 48  . Cataracts, both eyes   . Colitis   . Coronary artery disease   . Detached vitreous humor   . Diverticulitis   . 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 Gastritis   . GERD (gastroesophageal reflux disease)   . 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 Hemorrhoid   . Hemorrhoids   . Hyperlipemia   . 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 Kidney cysts    Left  . Kidney stones   . Lipoma of shoulder 2009   left  . Migraine   . Osteoarthritis   . Osteopenia   . Stented coronary artery     Past Surgical History:  Procedure Laterality Date  . ABDOMINAL HYSTERECTOMY    . benign breast biopsy  2007  . BREAST BIOPSY Left 2007   positive  . BREAST CYST ASPIRATION Right    neg  . BREAST LUMPECTOMY Left 2009    2009 breast cancer stage IIA T2 N0, ER positive, HER-2 negative, 2.5 cm primary tumor, poorly differentiated, status post wide local excision, one sentinel lymph node negative, high-risk Oncotype score 48  . BREAST SURGERY    . CARDIAC CATHETERIZATION N/A 06/22/2015   Procedure: Left Heart Cath and Coronary Angiography;  Surgeon: Teodoro Spray,  MD;  Location: [facility] [facility];  Service: Cardiovascular;  Laterality: N/A;  . CARDIAC CATHETERIZATION    . CATARACT EXTRACTION    . COLONOSCOPY    . CORONARY ANGIOPLASTY    . EYE SURGERY    . FOOT FRACTURE SURGERY Left   . PARTIAL HYSTERECTOMY    . UPPER GASTROINTESTINAL ENDOSCOPY      Family History  Problem Relation Age of Onset  . Heart disease Mother   . Diabetes Father   . Heart disease Father   . Diabetes Sister   . Diabetes Brother   . Cervical cancer Cousin   . Breast cancer Cousin   . Lung cancer Paternal Aunt   . Prostate cancer Maternal Uncle   . Leukemia Unknown   . Hypertension Unknown    Social History:  reports that she quit smoking about 24 years ago. Her smoking use included cigarettes. She has never used smokeless tobacco. She reports that she does not drink alcohol or use drugs.  Allergies:  Allergies  Allergen Reactions  . Alendronate     Other reaction(s): Unknown  . Alendronate Sodium     Other reaction(s): Unknown  . Atorvastatin     Other reaction(s): Muscle Pain Other reaction(s): Muscle Pain  . Azithromycin Hives  .  Ciprofloxacin     Other reaction(s): Headache  . Colestipol     Other reaction(s): Unknown  . Lidocaine Hives  . Lovastatin     Other reaction(s): Headache Other reaction(s): Headache  . Metronidazole     Other reaction(s): Headache  . Niacin     Other reaction(s): Unknown  . Risedronate     Other reaction(s): Unknown  . Risedronate Sodium Other (See Comments)  . Rosuvastatin     Other reaction(s): Unknown Other reaction(s): Unknown  . Simvastatin     Other reaction(s): Headache Other reaction(s): Headache    Medications: I have reviewed the patient's current medications. Prior to Admission:  Prior to Admission medications   Medication Sig Start Date End Date Taking? Authorizing Provider  ALPRAZolam Duanne Moron) 0.25 MG tablet TAKE ONE-HALF TABLET BY MOUTH AT BEDTIME 01/17/15  Yes [provider]  aspirin  EC 81 MG tablet Take 81 mg by mouth daily.    Yes [provider]  Azelaic Acid 15 % cream Apply 1 application topically daily. Reported on 11/18/2015   Yes [provider]  butalbital-aspirin-caffeine [facility]) 50-325-40 MG per capsule Take 1 capsule by mouth every 4 (four) hours as needed for headache.    Yes [provider]  calcium carbonate (TUMS - DOSED IN MG ELEMENTAL CALCIUM) 500 MG chewable tablet Chew 2 tablets by mouth daily.    Yes [provider]  Cholecalciferol (VITAMIN D-1000 MAX ST) 1000 UNITS tablet Take 1,000 Units by mouth daily.    Yes [provider]  docusate sodium (STOOL SOFTENER) 100 MG capsule Take 100 mg by mouth daily as needed for mild constipation.    Yes [provider]  donepezil (ARICEPT) 5 MG tablet Take 5 mg by mouth at bedtime.  08/09/16  Yes [provider]  ezetimibe (ZETIA) 10 MG tablet Take 10 mg by mouth daily.  03/01/15  Yes [provider]  gabapentin (NEURONTIN) 300 MG capsule TAKE ONE CAPSULE BY MOUTH THREE TIMES DAILY 01/09/15  Yes [provider]  metoprolol succinate (TOPROL-XL) 25 MG 24 hr tablet Take 25 mg by mouth daily.  09/06/15  Yes [provider]  nitroGLYCERIN (NITROSTAT) 0.4 MG SL tablet Place 0.4 mg under the tongue every 5 (five) minutes as needed for chest pain.   Yes [provider]  omeprazole (PRILOSEC) 20 MG capsule Take 20 mg by mouth 2 (two) times daily before a meal.   Yes [provider]  sodium chloride (MURO 128) 5 % ophthalmic solution Place 1 drop into the left eye as needed for eye irritation.   Yes [provider]  amoxicillin-clavulanate (AUGMENTIN) 875-125 MG tablet Take 1 tablet by mouth 2 (two) times daily. Patient not taking: Reported on 11/17/2016 07/29/16   Hubbard Robinson, MD   ROS: History obtained from the patient  General ROS: negative for - chills, fatigue, fever, night sweats, weight gain or  weight loss Psychological ROS: negative for - behavioral disorder, hallucinations, memory difficulties, mood swings or suicidal ideation Ophthalmic ROS: negative for - blurry vision, double vision, eye pain or loss of vision ENT ROS: negative for - epistaxis, nasal discharge, oral lesions, sore throat, tinnitus or vertigo Allergy and Immunology ROS: negative for - hives or itchy/watery eyes Hematological and Lymphatic ROS: negative for - bleeding problems, bruising or swollen lymph nodes Endocrine ROS: negative for - galactorrhea, hair pattern changes, polydipsia/polyuria or temperature intolerance Respiratory ROS: negative for - cough, hemoptysis, shortness of breath or wheezing Cardiovascular ROS: negative  for - chest pain, dyspnea on exertion, edema or irregular heartbeat Gastrointestinal ROS: negative for - abdominal pain, diarrhea, hematemesis, nausea/vomiting or stool incontinence Genito-Urinary ROS: negative for - dysuria, hematuria, incontinence or urinary frequency/urgency Musculoskeletal ROS: negative for - joint swelling or muscular weakness Neurological ROS: as noted in HPI Dermatological ROS: negative for rash and skin lesion changes  Physical Examination: Blood pressure (!) 149/77, pulse 63, temperature 98.1 F (36.7 C), resp. rate 14, height 5' 3" (1.6 m), weight 45.4 kg (100 lb), SpO2 100 %.  HEENT-  Normocephalic, no lesions, without obvious abnormality.  Normal external eye and conjunctiva.  Normal TM's bilaterally.  Normal auditory canals and external ears. Normal external nose, mucus membranes and septum.  Normal pharynx. Cardiovascular- S1, S2 normal, pulses palpable throughout   Lungs- chest clear, no wheezing, rales, normal symmetric air entry Abdomen- soft, non-tender; bowel sounds normal; no masses,  no organomegaly Extremities- no edema Lymph-no adenopathy palpable Musculoskeletal-no joint tenderness, deformity or swelling Skin-warm and dry, no hyperpigmentation,  vitiligo, or suspicious lesions  Neurological Examination   Mental Status: Alert, oriented, thought content appropriate.  Speech fluent without evidence of aphasia.  Able to follow 3 step commands without difficulty. Cranial Nerves: II: Discs flat bilaterally; Visual fields grossly normal, pupils equal, round, reactive to light and accommodation III,IV, VI: ptosis not present, extra-ocular motions intact bilaterally V,VII: smile symmetric, facial light touch sensation normal bilaterally VIII: hearing normal bilaterally IX,X: gag reflex present XI: bilateral shoulder shrug XII: midline tongue extension Motor: Right : Upper extremity   5/5    Left:     Upper extremity   5-/5  Lower extremity   5/5     Lower extremity   5/5 Tone and bulk:normal tone throughout; no atrophy noted Sensory: Pinprick and light touch intact throughout, bilaterally Deep Tendon Reflexes: 2+ and symmetric with absent AJ's bilaterally Plantars: Right: downgoing   Left: downgoing Cerebellar: Normal finger-to-nose and normal heel-to-shin testing bilaterally Gait: not tested due to safety concerns   Laboratory Studies:  Basic Metabolic Panel: Recent Labs  Lab 11/16/17 0806  NA 142  K 4.5  CL 105  CO2 31  GLUCOSE 107*  BUN 19  CREATININE 0.90  CALCIUM 9.5    Liver Function Tests: Recent Labs  Lab 11/16/17 0806  AST 30  ALT 28  ALKPHOS 76  BILITOT 0.5  PROT 7.3  ALBUMIN 4.0   No results for input(s): LIPASE, AMYLASE in the last 168 hours. No results for input(s): AMMONIA in the last 168 hours.  CBC: Recent Labs  Lab 11/16/17 0806  WBC 7.0  NEUTROABS 4.4  HGB 14.2  HCT 44.2  MCV 80.3  PLT 237    Cardiac Enzymes: Recent Labs  Lab 11/16/17 0806  TROPONINI <0.03    BNP: Invalid input(s): POCBNP  CBG: Recent Labs  Lab 11/16/17 4008  QPYPPJ 09    Microbiology: No results found for this or any previous visit.  Coagulation Studies: Recent Labs    11/16/17 0806  LABPROT  12.1  INR 0.90    Urinalysis:  Recent Labs  Lab 11/16/17 0806  COLORURINE COLORLESS*  LABSPEC 1.003*  PHURINE 7.0  GLUCOSEU NEGATIVE  HGBUR NEGATIVE  BILIRUBINUR NEGATIVE  KETONESUR NEGATIVE  PROTEINUR NEGATIVE  NITRITE NEGATIVE  LEUKOCYTESUR NEGATIVE    Lipid Panel: No results found for: CHOL, TRIG, HDL, CHOLHDL, VLDL, LDLCALC  HgbA1C: No results found for: HGBA1C  Urine Drug Screen:      Component Value Date/Time   LABOPIA NONE  DETECTED 11/16/2017 0806   COCAINSCRNUR NONE DETECTED 11/16/2017 0806   LABBENZ NONE DETECTED 11/16/2017 0806   AMPHETMU NONE DETECTED 11/16/2017 0806   THCU NONE DETECTED 11/16/2017 0806   LABBARB NONE DETECTED 11/16/2017 0806    Alcohol Level:  Recent Labs  Lab 11/16/17 0806  ETH <10    Other results: EKG: sinus rhythm at 63 bpm.  Imaging: Mr Brain Wo Contrast (neuro Protocol)  Result Date: 11/16/2017 CLINICAL DATA:  77 year old female. Code stroke. Abnormal left upper extremity sensation this morning. EXAM: MRI HEAD WITHOUT CONTRAST TECHNIQUE: Multiplanar, multiecho pulse sequences of the brain and surrounding structures were obtained without intravenous contrast. COMPARISON:  Head CT without contrast 0741 hours. Brain MRI 12/31/2013. FINDINGS: Brain: There are scattered small cortical and subcortical white matter foci of restricted diffusion in the right frontal lobe middle and superior frontal gyri as well as the anterior superior parietal lobe. Peri rolandic cortex in the region of left upper extremity representation is involved on series 100, image 46. There is minimal associated T2 and FLAIR hyperintensity. No hemorrhage or mass effect. No contralateral left hemisphere, posterior circulation, or posterior fossa restricted diffusion. Mild patchy T2 hyperintensity in the left corona radiata most resembles of a chronic lacunar infarct. No cortical encephalomalacia or chronic cerebral blood products identified. Negative bilateral deep gray  matter nuclei, brainstem, and cerebellum. No midline shift, mass effect, evidence of mass lesion, ventriculomegaly, extra-axial collection or acute intracranial hemorrhage. Cervicomedullary junction and pituitary are within normal limits. Vascular: Major intracranial vascular flow voids are preserved with generalized intracranial artery ectasia. The distal left vertebral artery is dominant. However, there is asymmetric increased STIR signal within a posterior sylvian right MCA branch on series 8, image 29 which is suspicious for slow flow or occlusion. Skull and upper cervical spine: Negative visible cervical spine. Normal bone marrow signal. Sinuses/Orbits: Postoperative changes to both globes. Paranasal sinuses and mastoids are stable and well pneumatized. Other: Visible internal auditory structures appear normal. Scalp and face soft tissues appear negative. IMPRESSION: 1. Positive for scattered small acute infarcts in the Right MCA territory, including the superior right peri rolandic cortex in the area of left upper extremity representation. No associated hemorrhage or mass effect. 2. Possible associated slow flow or occlusion of a posterior right MCA distal M2 or M3 branch. MRA or CTA could evaluate further. 3. Underlying small chronic lacunar infarct of the left corona radiata, but no other chronic ischemic changes identified in the brain. Electronically Signed   By: Genevie Ann M.D.   On: 11/16/2017 11:07   Ct Head Code Stroke Wo Contrast  Addendum Date: 11/16/2017   ADDENDUM REPORT: 11/16/2017 08:21 ADDENDUM: Study discussed by telephone with Dr. Darel Hong on 11/16/2017 at 0803 hours. Electronically Signed   By: Genevie Ann M.D.   On: 11/16/2017 08:21   Result Date: 11/16/2017 CLINICAL DATA:  Code stroke. 77 year old female with abnormal left upper extremity sensation this morning. EXAM: CT HEAD WITHOUT CONTRAST TECHNIQUE: Contiguous axial images were obtained from the base of the skull through the vertex  without intravenous contrast. COMPARISON:  Head CT without contrast 01/19/2014. FINDINGS: Brain: Cerebral volume remains normal for age. No midline shift, ventriculomegaly, mass effect, evidence of mass lesion, intracranial hemorrhage or evidence of cortically based acute infarction. Gray-white matter differentiation is within normal limits throughout the brain. Punctate dystrophic or atherosclerotic calcification along the right middle frontal gyrus (sagittal image 12). Vascular: Calcified atherosclerosis at the skull base. No suspicious intracranial vascular hyperdensity. Skull: Stable, negative.  Sinuses : Remain clear. Other: Stable and negative visible orbit and scalp soft tissues. ASPECTS (Wisner Stroke Program Early CT Score) - Ganglionic level infarction (caudate, lentiform nuclei, internal capsule, insula, M1-M3 cortex): 7 - Supraganglionic infarction (M4-M6 cortex): 3 Total score (0-10 with 10 being normal): 10 IMPRESSION: 1. Stable and normal for age noncontrast CT appearance of the brain. 2. ASPECTS is 10. Electronically Signed: By: Genevie Ann M.D. On: 11/16/2017 07:56    Assessment: 77 y.o. female presenting with LUE weakness and numbness.  MRI of the brain reviewed and reveals small, acute right MCA distribution infarcts.  There is possible right M2/M3 occlusion as well.  Patient on ASA at home.  With a history of carotid stenosis.  Last doppler performed in January showed 60-79% RICA stenosis and <75% LICA stenosis.  Suspect infarct from emboli from RICA atherosclerotic disease.  Further work up recommended.    Stroke Risk Factors - carotid stenosis and hyperlipidemia  Plan: 1. HgbA1c, fasting lipid panel 2. PT consult, OT consult, Speech consult 3. Echocardiogram 4. CTA of the head and neck 5. Prophylactic therapy-ASA 6m, Plavix 717mdaily 6. NPO until RN stroke swallow screen 7. Telemetry monitoring 8. Frequent neuro checks   LeAlexis GoodellMD Neurology [id number]/12/2017,  2:19 PM

## 2017-11-16 NOTE — ED Triage Notes (Signed)
Pt presents via [facility] from home. Pt complains of left arm weakness after waking from sleep. Grips are equal.

## 2017-11-16 NOTE — Consult Note (Signed)
[facility] Consultation    ASSESSMENT/PLAN   77 year old female with acute left upper extremity weakness and numbness.  MRI revealed small acute right MCA distribution infarcts along with possible right M2/M3 occlusions.  Carotid studies reveal evidence of occlusion.  Per neurology will continue in the intensive care unit for monitoring, on aspirin and Plavix.  Will advance diet as tolerated.  Name: Kathleen Walls MRN: [id number] DOB: 25-Apr-1941    ADMISSION DATE:  11/16/2017 CONSULTATION DATE:  11/16/2017  REFERRING MD :  Dr. Margaretmary Walls  CHIEF COMPLAINT: Left hand weakness   HISTORY OF PRESENT ILLNESS: Kathleen Walls is a very pleasant 77 year old female with a past medical history remarkable for arthritis, breast cancer, coronary artery disease, postchemotherapy peripheral neuropathy, hyperlipidemia, nephrolithiasis, gastroesophageal reflux disease who upon awakening this morning noted that her left hand was weak and uncoordinated.  Husband called EMS and she was brought into the emergency department for acute stroke evaluation.  She has been seen by neurology, is status post MRI of the brain, CT angiogram of the head and neck.  Was not felt to be a TPA or anticoagulation candidate and is presently on aspirin and Plavix.  In the intensive care unit she is feeling well, has improved symptomatology with her left hand.  Is hungry, swallowing adequately on bedside evaluation with stable hemodynamics.  PAST MEDICAL HISTORY :  Past Medical History:  Diagnosis Date  . Arthritis   . Breast cancer [facility]) 2009    2009 breast cancer stage IIA T2 N0, ER positive, HER-2 negative, 2.5 cm primary tumor, poorly differentiated, status post wide local excision, one sentinel lymph node negative, high-risk Oncotype score 48  . Cataracts, both eyes   . Colitis   . Coronary artery disease   . Detached vitreous humor   . Diverticulitis   . 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 Gastritis   . GERD  (gastroesophageal reflux disease)   . 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 Hemorrhoid   . Hemorrhoids   . Hyperlipemia   . 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 Kidney cysts    Left  . Kidney stones   . Lipoma of shoulder 2009   left  . Migraine   . Osteoarthritis   . Osteopenia   . Stented coronary artery    Past Surgical History:  Procedure Laterality Date  . ABDOMINAL HYSTERECTOMY    . benign breast biopsy  2007  . BREAST BIOPSY Left 2007   positive  . BREAST CYST ASPIRATION Right    neg  . BREAST LUMPECTOMY Left 2009    2009 breast cancer stage IIA T2 N0, ER positive, HER-2 negative, 2.5 cm primary tumor, poorly differentiated, status post wide local excision, one sentinel lymph node negative, high-risk Oncotype score 48  . BREAST SURGERY    . CARDIAC CATHETERIZATION N/A 06/22/2015   Procedure: Left Heart Cath and Coronary Angiography;  Surgeon: Kathleen Spray, MD;  Location: [facility];  Service: Cardiovascular;  Laterality: N/A;  . CARDIAC CATHETERIZATION    . CATARACT EXTRACTION    . COLONOSCOPY    . CORONARY ANGIOPLASTY    . EYE SURGERY    . FOOT FRACTURE SURGERY Left   . PARTIAL HYSTERECTOMY    . UPPER GASTROINTESTINAL ENDOSCOPY     Prior to Admission medications   Medication Sig Start Date End Date Taking? Authorizing Provider  ALPRAZolam Kathleen Walls) 0.25  MG tablet TAKE ONE-HALF TABLET BY MOUTH AT BEDTIME 01/17/15  Yes [provider]  aspirin EC 81 MG tablet Take 81 mg by mouth daily.    Yes [provider]  Azelaic Acid 15 % cream Apply 1 application topically daily. Reported on 11/18/2015   Yes [provider]  butalbital-aspirin-caffeine [facility]) 50-325-40 MG per capsule Take 1 capsule by mouth every 4 (four) hours as needed for headache.    Yes [provider]  calcium carbonate (TUMS - DOSED IN MG ELEMENTAL CALCIUM) 500 MG chewable tablet Chew 2 tablets by mouth  daily.    Yes [provider]  Cholecalciferol (VITAMIN D-1000 MAX ST) 1000 UNITS tablet Take 1,000 Units by mouth daily.    Yes [provider]  docusate sodium (STOOL SOFTENER) 100 MG capsule Take 100 mg by mouth daily as needed for mild constipation.    Yes [provider]  donepezil (ARICEPT) 5 MG tablet Take 5 mg by mouth at bedtime.  08/09/16  Yes [provider]  ezetimibe (ZETIA) 10 MG tablet Take 10 mg by mouth daily.  03/01/15  Yes [provider]  gabapentin (NEURONTIN) 300 MG capsule TAKE ONE CAPSULE BY MOUTH THREE TIMES DAILY 01/09/15  Yes [provider]  metoprolol succinate (TOPROL-XL) 25 MG 24 hr tablet Take 25 mg by mouth daily.  09/06/15  Yes [provider]  nitroGLYCERIN (NITROSTAT) 0.4 MG SL tablet Place 0.4 mg under the tongue every 5 (five) minutes as needed for chest pain.   Yes [provider]  omeprazole (PRILOSEC) 20 MG capsule Take 20 mg by mouth 2 (two) times daily before a meal.   Yes [provider]  sodium chloride (MURO 128) 5 % ophthalmic solution Place 1 drop into the left eye as needed for eye irritation.   Yes [provider]  amoxicillin-clavulanate (AUGMENTIN) 875-125 MG tablet Take 1 tablet by mouth 2 (two) times daily. Patient not taking: Reported on 11/17/2016 07/29/16   Kathleen Robinson, MD   Allergies  Allergen Reactions  . Alendronate     Other reaction(s): Unknown  . Alendronate Sodium     Other reaction(s): Unknown  . Atorvastatin     Other reaction(s): Muscle Pain Other reaction(s): Muscle Pain  . Azithromycin Hives  . Ciprofloxacin     Other reaction(s): Headache  . Colestipol     Other reaction(s): Unknown  . Lidocaine Hives  . Lovastatin     Other reaction(s): Headache Other reaction(s): Headache  . Metronidazole     Other reaction(s): Headache  . Niacin     Other reaction(s): Unknown  . Risedronate     Other reaction(s): Unknown  .  Risedronate Sodium Other (See Comments)  . Rosuvastatin     Other reaction(s): Unknown Other reaction(s): Unknown  . Simvastatin     Other reaction(s): Headache Other reaction(s): Headache    FAMILY HISTORY:  Family History  Problem Relation Age of Onset  . Heart disease Mother   . Diabetes Father   . Heart disease Father   . Diabetes Sister   . Diabetes Brother   . Cervical cancer Cousin   . Breast cancer Cousin   . Lung cancer Paternal Aunt   . Prostate cancer Maternal Uncle   . Leukemia Unknown   . Hypertension Unknown    SOCIAL HISTORY:  reports that she quit smoking about 24 years ago. Her smoking use included cigarettes. She has never used smokeless tobacco. She reports that she does not  drink alcohol or use drugs.  REVIEW OF SYSTEMS:   Constitutional: Feels well. Cardiovascular: No chest pain.  Pulmonary: Denies dyspnea.   The remainder of systems were reviewed and were found to be negative other than what is documented in the HPI.    VITAL SIGNS: Temp:  [98.1 F (36.7 C)-98.3 F (36.8 C)] 98.3 F (36.8 C) (04/05 1644) Pulse Rate:  [56-72] 71 (04/05 1644) Resp:  [13-22] 13 (04/05 1644) BP: (127-188)/(51-87) 174/73 (04/05 1644) SpO2:  [96 %-100 %] 99 % (04/05 1644) Weight:  [42 kg (92 lb 9.5 oz)-45.4 kg (100 lb)] 42 kg (92 lb 9.5 oz) (04/05 1644) HEMODYNAMICS:  INTAKE / OUTPUT: No intake or output data in the 24 hours ending 11/16/17 1745  Physical Examination:   VS: BP (!) 174/73   Pulse 71   Temp 98.3 F (36.8 C) (Oral)   Resp 13   Ht _0  (1.6 m)   Wt 42 kg (92 lb 9.5 oz)   SpO2 99%   BMI 16.40 kg/m   General Appearance: No distress  Neuro:without focal findings, mental status, speech normal,.  At the present time she has coordination and muscle strength in the left arm and hand HEENT: PERRLA, EOM intact, no ptosis, no other lesions noticed; bilateral carotid bruits appreciated Pulmonary: normal breath sounds., diaphragmatic excursion  normal. CardiovascularNormal S1,S2.  No m/r/g.    Abdomen: Benign, Soft, non-tender, No masses, hepatosplenomegaly, No lymphadenopathy Skin:   warm, no rashes, no ecchymosis  Extremities: normal, no cyanosis, clubbing, no edema, warm with normal capillary refill.    LABS: Reviewed   LABORATORY PANEL:   CBC Recent Labs  Lab 11/16/17 0806  WBC 7.0  HGB 14.2  HCT 44.2  PLT 237    Chemistries  Recent Labs  Lab 11/16/17 0806  NA 142  K 4.5  CL 105  CO2 31  GLUCOSE 107*  BUN 19  CREATININE 0.90  CALCIUM 9.5  AST 30  ALT 28  ALKPHOS 76  BILITOT 0.5    Recent Labs  Lab 11/16/17 0739 11/16/17 1645  GLUCAP 91 85   No results for input(s): PHART, PCO2ART, PO2ART in the last 168 hours. Recent Labs  Lab 11/16/17 0806  AST 30  ALT 28  ALKPHOS 76  BILITOT 0.5  ALBUMIN 4.0    Cardiac Enzymes Recent Labs  Lab 11/16/17 0806  TROPONINI <0.03    RADIOLOGY:  Ct Angio Head W Or Wo Contrast  Result Date: 11/16/2017 CLINICAL DATA:  Stroke EXAM: CT ANGIOGRAPHY HEAD AND NECK TECHNIQUE: Multidetector CT imaging of the head and neck was performed using the standard protocol during bolus administration of intravenous contrast. Multiplanar CT image reconstructions and MIPs were obtained to evaluate the vascular anatomy. Carotid stenosis measurements (when applicable) are obtained utilizing NASCET criteria, using the distal internal carotid diameter as the denominator. CONTRAST:  44m OMNIPAQUE IOHEXOL 350 MG/ML SOLN COMPARISON:  MRI head 11/16/2017 FINDINGS: CTA NECK FINDINGS Aortic arch: Moderate atherosclerotic calcification in the aortic arch and proximal great vessels. Mild stenosis left subclavian artery. Remaining great vessels widely patent. Right carotid system: Right common carotid artery patent. Calcified and noncalcified plaque at the right carotid bifurcation. 75% diameter stenosis proximal right internal carotid artery. 50% diameter stenosis proximal right external  carotid artery. Left carotid system: Left common carotid artery widely patent. Calcified plaque at the left carotid bifurcation narrowing the lumen by 50% diameter stenosis. Vertebral arteries: Moderate to severe stenosis origin of right vertebral artery. Rest of the right  vertebral artery widely patent to the basilar Mild stenosis origin of left vertebral artery. Left vertebral artery dominant without additional stenosis. Skeleton: Cervical spondylosis.  No acute skeletal abnormality. Other neck: Negative for mass or adenopathy. Upper chest: Negative Review of the MIP images confirms the above findings CTA HEAD FINDINGS Anterior circulation: Atherosclerotic calcification in the cavernous carotid bilaterally without significant stenosis. Anterior and middle cerebral arteries patent without significant stenosis or large vessel occlusion Posterior circulation: Both vertebral arteries contribute to the basilar without stenosis. Basilar widely patent. PICA, superior cerebellar, and posterior cerebral arteries widely patent without stenosis. Venous sinuses: Patent Anatomic variants: None Delayed phase: Normal enhancement on delayed imaging Review of the MIP images confirms the above findings IMPRESSION: 75% diameter stenosis proximal right internal carotid artery due to atherosclerotic stenosis 50% diameter stenosis proximal left internal carotid artery due to atherosclerotic stenosis Moderate stenosis origin right vertebral artery and mild stenosis origin left vertebral artery No emergent large vessel intracranial occlusion. Electronically Signed   By: Franchot Gallo M.D.   On: 11/16/2017 14:23   Ct Angio Neck W Or Wo Contrast  Result Date: 11/16/2017 CLINICAL DATA:  Stroke EXAM: CT ANGIOGRAPHY HEAD AND NECK TECHNIQUE: Multidetector CT imaging of the head and neck was performed using the standard protocol during bolus administration of intravenous contrast. Multiplanar CT image reconstructions and MIPs were obtained  to evaluate the vascular anatomy. Carotid stenosis measurements (when applicable) are obtained utilizing NASCET criteria, using the distal internal carotid diameter as the denominator. CONTRAST:  28m OMNIPAQUE IOHEXOL 350 MG/ML SOLN COMPARISON:  MRI head 11/16/2017 FINDINGS: CTA NECK FINDINGS Aortic arch: Moderate atherosclerotic calcification in the aortic arch and proximal great vessels. Mild stenosis left subclavian artery. Remaining great vessels widely patent. Right carotid system: Right common carotid artery patent. Calcified and noncalcified plaque at the right carotid bifurcation. 75% diameter stenosis proximal right internal carotid artery. 50% diameter stenosis proximal right external carotid artery. Left carotid system: Left common carotid artery widely patent. Calcified plaque at the left carotid bifurcation narrowing the lumen by 50% diameter stenosis. Vertebral arteries: Moderate to severe stenosis origin of right vertebral artery. Rest of the right vertebral artery widely patent to the basilar Mild stenosis origin of left vertebral artery. Left vertebral artery dominant without additional stenosis. Skeleton: Cervical spondylosis.  No acute skeletal abnormality. Other neck: Negative for mass or adenopathy. Upper chest: Negative Review of the MIP images confirms the above findings CTA HEAD FINDINGS Anterior circulation: Atherosclerotic calcification in the cavernous carotid bilaterally without significant stenosis. Anterior and middle cerebral arteries patent without significant stenosis or large vessel occlusion Posterior circulation: Both vertebral arteries contribute to the basilar without stenosis. Basilar widely patent. PICA, superior cerebellar, and posterior cerebral arteries widely patent without stenosis. Venous sinuses: Patent Anatomic variants: None Delayed phase: Normal enhancement on delayed imaging Review of the MIP images confirms the above findings IMPRESSION: 75% diameter stenosis  proximal right internal carotid artery due to atherosclerotic stenosis 50% diameter stenosis proximal left internal carotid artery due to atherosclerotic stenosis Moderate stenosis origin right vertebral artery and mild stenosis origin left vertebral artery No emergent large vessel intracranial occlusion. Electronically Signed   By: CFranchot GalloM.D.   On: 11/16/2017 14:23   Mr Brain Wo Contrast (neuro Protocol)  Result Date: 11/16/2017 CLINICAL DATA:  77year old female. Code stroke. Abnormal left upper extremity sensation this morning. EXAM: MRI HEAD WITHOUT CONTRAST TECHNIQUE: Multiplanar, multiecho pulse sequences of the brain and surrounding structures were obtained without intravenous contrast. COMPARISON:  Head CT without contrast 0741 hours. Brain MRI 12/31/2013. FINDINGS: Brain: There are scattered small cortical and subcortical white matter foci of restricted diffusion in the right frontal lobe middle and superior frontal gyri as well as the anterior superior parietal lobe. Peri rolandic cortex in the region of left upper extremity representation is involved on series 100, image 46. There is minimal associated T2 and FLAIR hyperintensity. No hemorrhage or mass effect. No contralateral left hemisphere, posterior circulation, or posterior fossa restricted diffusion. Mild patchy T2 hyperintensity in the left corona radiata most resembles of a chronic lacunar infarct. No cortical encephalomalacia or chronic cerebral blood products identified. Negative bilateral deep gray matter nuclei, brainstem, and cerebellum. No midline shift, mass effect, evidence of mass lesion, ventriculomegaly, extra-axial collection or acute intracranial hemorrhage. Cervicomedullary junction and pituitary are within normal limits. Vascular: Major intracranial vascular flow voids are preserved with generalized intracranial artery ectasia. The distal left vertebral artery is dominant. However, there is asymmetric increased STIR  signal within a posterior sylvian right MCA branch on series 8, image 29 which is suspicious for slow flow or occlusion. Skull and upper cervical spine: Negative visible cervical spine. Normal bone marrow signal. Sinuses/Orbits: Postoperative changes to both globes. Paranasal sinuses and mastoids are stable and well pneumatized. Other: Visible internal auditory structures appear normal. Scalp and face soft tissues appear negative. IMPRESSION: 1. Positive for scattered small acute infarcts in the Right MCA territory, including the superior right peri rolandic cortex in the area of left upper extremity representation. No associated hemorrhage or mass effect. 2. Possible associated slow flow or occlusion of a posterior right MCA distal M2 or M3 branch. MRA or CTA could evaluate further. 3. Underlying small chronic lacunar infarct of the left corona radiata, but no other chronic ischemic changes identified in the brain. Electronically Signed   By: Genevie Ann M.D.   On: 11/16/2017 11:07   Ct Head Code Stroke Wo Contrast  Addendum Date: 11/16/2017   ADDENDUM REPORT: 11/16/2017 08:21 ADDENDUM: Study discussed by telephone with Dr. Darel Hong on 11/16/2017 at 0803 hours. Electronically Signed   By: Genevie Ann M.D.   On: 11/16/2017 08:21   Result Date: 11/16/2017 CLINICAL DATA:  Code stroke. 77 year old female with abnormal left upper extremity sensation this morning. EXAM: CT HEAD WITHOUT CONTRAST TECHNIQUE: Contiguous axial images were obtained from the base of the skull through the vertex without intravenous contrast. COMPARISON:  Head CT without contrast 01/19/2014. FINDINGS: Brain: Cerebral volume remains normal for age. No midline shift, ventriculomegaly, mass effect, evidence of mass lesion, intracranial hemorrhage or evidence of cortically based acute infarction. Gray-white matter differentiation is within normal limits throughout the brain. Punctate dystrophic or atherosclerotic calcification along the right middle  frontal gyrus (sagittal image 12). Vascular: Calcified atherosclerosis at the skull base. No suspicious intracranial vascular hyperdensity. Skull: Stable, negative. Sinuses : Remain clear. Other: Stable and negative visible orbit and scalp soft tissues. ASPECTS ([place] Stroke Program Early CT Score) - Ganglionic level infarction (caudate, lentiform nuclei, internal capsule, insula, M1-M3 cortex): 7 - Supraganglionic infarction (M4-M6 cortex): 3 Total score (0-10 with 10 being normal): 10 IMPRESSION: 1. Stable and normal for age noncontrast CT appearance of the brain. 2. ASPECTS is 10. Electronically Signed: By: Genevie Ann M.D. On: 11/16/2017 07:56    Hermelinda Dellen, DO 11/16/2017, 5:45 PM

## 2017-11-16 NOTE — Plan of Care (Signed)
Patient sitting up in bed awake and alert. Husband at bedside. Able to make needs known.  No acute distress noted.  Neuro check completed. No deficiency noted.  No difficulty noted when swallowing, eating.  Will continue to monitor.

## 2017-11-16 NOTE — Progress Notes (Signed)
Patient seen and examined. Doing well with stable vital signs. Neuro exam unremarkable. OK to transfer to telemetry  [place]. [facility] ANP-BC Pulmonary and Critical Care Medicine [facility] Pager [phone] or [phone]  NB: This document was prepared using Dragon voice recognition software and may include unintentional dictation errors.

## 2017-11-16 NOTE — Progress Notes (Signed)
Family Meeting Note  Advance Directive:yes  Today a meeting took place with the Patient , HUSBAND    The following clinical team members were present during this meeting:MD  The following were discussed:Patient's diagnosis: TIA versus CVA, essential hypertension, hyperlipidemia, history of breast cancer, coronary artery disease, treatment plan of care discussed in detail with the patient and her husband at bedside.  They both verbalized understanding of the plan.   Patient's progosis: Unable to determine and Goals for treatment: Full Code, husband is healthcare power of attorney  Additional follow-up to be provided: Hospitalist, neurology  Time spent during discussion:18MIN  Nicholes Mango, MD

## 2017-11-16 NOTE — Progress Notes (Signed)
Chaplain responded to a code stroke. Pt was alert but feeling numbness. Chaplain prayed with the pt and staff.Chaplain waited for husband to come to the room and prayed with him. Chaplain was notified there pastor was on the way. Chaplain let pt and spouse know they are available if needed.    11/16/17 0700  Clinical Encounter Type  Visited With Patient;Family  Visit Type Initial;Spiritual support  Referral From Nurse  Spiritual Encounters  Spiritual Needs Prayer

## 2017-11-16 NOTE — ED Notes (Signed)
Attempted to call report again on pt.  RN unable to take report at this time.  Will call again in 15 minutes.

## 2017-11-16 NOTE — H&P (Signed)
[facility] NAME: Kathleen Walls    MR#:  [id number]  DATE OF BIRTH:  1940/12/22  DATE OF ADMISSION:  11/16/2017  PRIMARY CARE PHYSICIAN: Idelle Crouch, MD   REQUESTING/REFERRING PHYSICIAN: Darel Hong, MD  CHIEF COMPLAINT:  Left-sided weakness  HISTORY OF PRESENT ILLNESS:  Kathleen Walls  is a 77 y.o. female with a known history of coronary artery disease, history of essential hypertension hyperlipidemia carotid artery stenosis and history of breast cancer given to the ED with the left upper extremity and lower extremity weakness.  She was found to be normal until 1 AM today she woke up around 6:00 with left-sided weakness and slurry speech.  Came into the ED CT head is negative seen by telemetry neurologist, not recommending TPA.  MRI of the brain was ordered and hospitalist team was called to admit the patient.  Patient seems to be anxious husband at bedside.  Denies any headache or blurry vision.  Denies any falls.  PAST MEDICAL HISTORY:   Past Medical History:  Diagnosis Date  . Arthritis   . Breast cancer [facility]) 2009    2009 breast cancer stage IIA T2 N0, ER positive, HER-2 negative, 2.5 cm primary tumor, poorly differentiated, status post wide local excision, one sentinel lymph node negative, high-risk Oncotype score 48  . Cataracts, both eyes   . Colitis   . Coronary artery disease   . Detached vitreous humor   . Diverticulitis   . 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 Gastritis   . GERD (gastroesophageal reflux disease)   . 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 Hemorrhoid   . Hemorrhoids   . Hyperlipemia   . 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 Kidney cysts    Left  . Kidney stones   . Lipoma of shoulder 2009   left  . Migraine   . Osteoarthritis   . Osteopenia   . Stented coronary artery     PAST SURGICAL HISTOIRY:   Past Surgical History:  Procedure  Laterality Date  . ABDOMINAL HYSTERECTOMY    . benign breast biopsy  2007  . BREAST BIOPSY Left 2007   positive  . BREAST CYST ASPIRATION Right    neg  . BREAST LUMPECTOMY Left 2009    2009 breast cancer stage IIA T2 N0, ER positive, HER-2 negative, 2.5 cm primary tumor, poorly differentiated, status post wide local excision, one sentinel lymph node negative, high-risk Oncotype score 48  . BREAST SURGERY    . CARDIAC CATHETERIZATION N/A 06/22/2015   Procedure: Left Heart Cath and Coronary Angiography;  Surgeon: Teodoro Spray, MD;  Location: [facility];  Service: Cardiovascular;  Laterality: N/A;  . CARDIAC CATHETERIZATION    . CATARACT EXTRACTION    . COLONOSCOPY    . CORONARY ANGIOPLASTY    . EYE SURGERY    . FOOT FRACTURE SURGERY Left   . PARTIAL HYSTERECTOMY    . UPPER GASTROINTESTINAL ENDOSCOPY      SOCIAL HISTORY:   Social History   Tobacco Use  . Smoking status: Former Smoker    Types: Cigarettes    Last attempt to quit: 08/14/1993    Years since quitting: 24.2  . Smokeless tobacco: Never Used  Substance Use Topics  . Alcohol use: No    Alcohol/week: 0.0 oz    FAMILY HISTORY:   Family History  Problem Relation Age of Onset  . Heart disease Mother   . Diabetes Father   . Heart disease Father   . Diabetes Sister   . Diabetes Brother   . Cervical cancer Cousin   . Breast cancer Cousin   . Lung cancer Paternal Aunt   . Prostate cancer Maternal Uncle   . Leukemia Unknown   . Hypertension Unknown     DRUG ALLERGIES:   Allergies  Allergen Reactions  . Alendronate     Other reaction(s): Unknown  . Alendronate Sodium     Other reaction(s): Unknown  . Atorvastatin     Other reaction(s): Muscle Pain Other reaction(s): Muscle Pain  . Azithromycin Hives  . Ciprofloxacin     Other reaction(s): Headache  . Colestipol     Other reaction(s): Unknown  . Lidocaine Hives  . Lovastatin     Other reaction(s): Headache Other reaction(s): Headache  .  Metronidazole     Other reaction(s): Headache  . Niacin     Other reaction(s): Unknown  . Risedronate     Other reaction(s): Unknown  . Risedronate Sodium Other (See Comments)  . Rosuvastatin     Other reaction(s): Unknown Other reaction(s): Unknown  . Simvastatin     Other reaction(s): Headache Other reaction(s): Headache    REVIEW OF SYSTEMS:  CONSTITUTIONAL: No fever, fatigue  EYES: No blurred or double vision.  EARS, NOSE, AND THROAT: No tinnitus or ear pain.  RESPIRATORY: No cough, shortness of breath, wheezing or hemoptysis.  CARDIOVASCULAR: No chest pain, orthopnea, edema.  GASTROINTESTINAL: No nausea, vomiting, diarrhea or abdominal pain.  GENITOURINARY: No dysuria, hematuria.  ENDOCRINE: No polyuria, nocturia,  HEMATOLOGY: No anemia, easy bruising or bleeding SKIN: No rash or lesion. MUSCULOSKELETAL: No joint pain or arthritis.   NEUROLOGIC: Reporting left-sided weakness and dysarthria no tingling, numbness PSYCHIATRY: No anxiety or depression.   MEDICATIONS AT HOME:   Prior to Admission medications   Medication Sig Start Date End Date Taking? Authorizing Provider  ALPRAZolam Duanne Moron) 0.25 MG tablet TAKE ONE-HALF TABLET BY MOUTH AT BEDTIME 01/17/15  Yes [provider]  aspirin EC 81 MG tablet Take 81 mg by mouth daily.    Yes [provider]  Azelaic Acid 15 % cream Apply 1 application topically daily. Reported on 11/18/2015   Yes [provider]  butalbital-aspirin-caffeine [facility]) 50-325-40 MG per capsule Take 1 capsule by mouth every 4 (four) hours as needed for headache.    Yes [provider]  calcium carbonate (TUMS - DOSED IN MG ELEMENTAL CALCIUM) 500 MG chewable tablet Chew 2 tablets by mouth daily.    Yes [provider]  Cholecalciferol (VITAMIN D-1000 MAX ST) 1000 UNITS tablet Take 1,000 Units by mouth daily.    Yes [provider]  docusate sodium (STOOL SOFTENER) 100 MG capsule Take 100 mg by mouth  daily as needed for mild constipation.    Yes [provider]  donepezil (ARICEPT) 5 MG tablet Take 5 mg by mouth at bedtime.  08/09/16  Yes [provider]  ezetimibe (ZETIA) 10 MG tablet Take 10 mg by mouth daily.  03/01/15  Yes [provider]  gabapentin (NEURONTIN) 300 MG capsule TAKE ONE CAPSULE BY MOUTH THREE TIMES DAILY 01/09/15  Yes [provider]  metoprolol succinate (TOPROL-XL) 25 MG 24 hr tablet Take 25 mg by mouth daily.  09/06/15  Yes [provider]  nitroGLYCERIN (NITROSTAT) 0.4 MG SL tablet Place 0.4 mg under the tongue every 5 (five) minutes  as needed for chest pain.   Yes [provider]  omeprazole (PRILOSEC) 20 MG capsule Take 20 mg by mouth 2 (two) times daily before a meal.   Yes [provider]  sodium chloride (MURO 128) 5 % ophthalmic solution Place 1 drop into the left eye as needed for eye irritation.   Yes [provider]  amoxicillin-clavulanate (AUGMENTIN) 875-125 MG tablet Take 1 tablet by mouth 2 (two) times daily. Patient not taking: Reported on 11/17/2016 07/29/16   Hubbard Robinson, MD      VITAL SIGNS:  Blood pressure (!) 149/77, pulse 63, temperature 98.1 F (36.7 C), resp. rate 14, height '5\' 3"'$  (1.6 m), weight 45.4 kg (100 lb), SpO2 100 %.  PHYSICAL EXAMINATION:  GENERAL:  77 y.o.-year-old patient lying in the bed with no acute distress.  EYES: Pupils equal, round, reactive to light and accommodation. No scleral icterus. Extraocular muscles intact.  HEENT: Head atraumatic, normocephalic. Oropharynx and nasopharynx clear.  NECK:  Supple, no jugular venous distention. No thyroid enlargement, no tenderness.  LUNGS: Normal breath sounds bilaterally, no wheezing, rales,rhonchi or crepitation. No use of accessory muscles of respiration.  CARDIOVASCULAR: S1, S2 normal. No murmurs, rubs, or gallops.  ABDOMEN: Soft, nontender, nondistended. Bowel sounds present. EXTREMITIES: No pedal edema,  cyanosis, or clubbing.  NEUROLOGIC: Cranial nerves II through XII are intact. Muscle strength 5/5 in all extremities except left upper and lower extremity 4 out of 5. Sensation intact. Gait not checked.  Some dysarthria noticed  pSYCHIATRIC: The patient is alert and oriented x 3.  SKIN: No obvious rash, lesion, or ulcer.   LABORATORY PANEL:   CBC Recent Labs  Lab 11/16/17 0806  WBC 7.0  HGB 14.2  HCT 44.2  PLT 237   ------------------------------------------------------------------------------------------------------------------  Chemistries  Recent Labs  Lab 11/16/17 0806  NA 142  K 4.5  CL 105  CO2 31  GLUCOSE 107*  BUN 19  CREATININE 0.90  CALCIUM 9.5  AST 30  ALT 28  ALKPHOS 76  BILITOT 0.5   ------------------------------------------------------------------------------------------------------------------  Cardiac Enzymes Recent Labs  Lab 11/16/17 0806  TROPONINI <0.03   ------------------------------------------------------------------------------------------------------------------  RADIOLOGY:  Ct Angio Head W Or Wo Contrast  Result Date: 11/16/2017 CLINICAL DATA:  Stroke EXAM: CT ANGIOGRAPHY HEAD AND NECK TECHNIQUE: Multidetector CT imaging of the head and neck was performed using the standard protocol during bolus administration of intravenous contrast. Multiplanar CT image reconstructions and MIPs were obtained to evaluate the vascular anatomy. Carotid stenosis measurements (when applicable) are obtained utilizing NASCET criteria, using the distal internal carotid diameter as the denominator. CONTRAST:  55m OMNIPAQUE IOHEXOL 350 MG/ML SOLN COMPARISON:  MRI head 11/16/2017 FINDINGS: CTA NECK FINDINGS Aortic arch: Moderate atherosclerotic calcification in the aortic arch and proximal great vessels. Mild stenosis left subclavian artery. Remaining great vessels widely patent. Right carotid system: Right common carotid artery patent. Calcified and noncalcified  plaque at the right carotid bifurcation. 75% diameter stenosis proximal right internal carotid artery. 50% diameter stenosis proximal right external carotid artery. Left carotid system: Left common carotid artery widely patent. Calcified plaque at the left carotid bifurcation narrowing the lumen by 50% diameter stenosis. Vertebral arteries: Moderate to severe stenosis origin of right vertebral artery. Rest of the right vertebral artery widely patent to the basilar Mild stenosis origin of left vertebral artery. Left vertebral artery dominant without additional stenosis. Skeleton: Cervical spondylosis.  No acute skeletal abnormality. Other neck: Negative for mass or adenopathy. Upper chest: Negative Review of the MIP images  confirms the above findings CTA HEAD FINDINGS Anterior circulation: Atherosclerotic calcification in the cavernous carotid bilaterally without significant stenosis. Anterior and middle cerebral arteries patent without significant stenosis or large vessel occlusion Posterior circulation: Both vertebral arteries contribute to the basilar without stenosis. Basilar widely patent. PICA, superior cerebellar, and posterior cerebral arteries widely patent without stenosis. Venous sinuses: Patent Anatomic variants: None Delayed phase: Normal enhancement on delayed imaging Review of the MIP images confirms the above findings IMPRESSION: 75% diameter stenosis proximal right internal carotid artery due to atherosclerotic stenosis 50% diameter stenosis proximal left internal carotid artery due to atherosclerotic stenosis Moderate stenosis origin right vertebral artery and mild stenosis origin left vertebral artery No emergent large vessel intracranial occlusion. Electronically Signed   By: Franchot Gallo M.D.   On: 11/16/2017 14:23   Ct Angio Neck W Or Wo Contrast  Result Date: 11/16/2017 CLINICAL DATA:  Stroke EXAM: CT ANGIOGRAPHY HEAD AND NECK TECHNIQUE: Multidetector CT imaging of the head and neck was  performed using the standard protocol during bolus administration of intravenous contrast. Multiplanar CT image reconstructions and MIPs were obtained to evaluate the vascular anatomy. Carotid stenosis measurements (when applicable) are obtained utilizing NASCET criteria, using the distal internal carotid diameter as the denominator. CONTRAST:  22m OMNIPAQUE IOHEXOL 350 MG/ML SOLN COMPARISON:  MRI head 11/16/2017 FINDINGS: CTA NECK FINDINGS Aortic arch: Moderate atherosclerotic calcification in the aortic arch and proximal great vessels. Mild stenosis left subclavian artery. Remaining great vessels widely patent. Right carotid system: Right common carotid artery patent. Calcified and noncalcified plaque at the right carotid bifurcation. 75% diameter stenosis proximal right internal carotid artery. 50% diameter stenosis proximal right external carotid artery. Left carotid system: Left common carotid artery widely patent. Calcified plaque at the left carotid bifurcation narrowing the lumen by 50% diameter stenosis. Vertebral arteries: Moderate to severe stenosis origin of right vertebral artery. Rest of the right vertebral artery widely patent to the basilar Mild stenosis origin of left vertebral artery. Left vertebral artery dominant without additional stenosis. Skeleton: Cervical spondylosis.  No acute skeletal abnormality. Other neck: Negative for mass or adenopathy. Upper chest: Negative Review of the MIP images confirms the above findings CTA HEAD FINDINGS Anterior circulation: Atherosclerotic calcification in the cavernous carotid bilaterally without significant stenosis. Anterior and middle cerebral arteries patent without significant stenosis or large vessel occlusion Posterior circulation: Both vertebral arteries contribute to the basilar without stenosis. Basilar widely patent. PICA, superior cerebellar, and posterior cerebral arteries widely patent without stenosis. Venous sinuses: Patent Anatomic  variants: None Delayed phase: Normal enhancement on delayed imaging Review of the MIP images confirms the above findings IMPRESSION: 75% diameter stenosis proximal right internal carotid artery due to atherosclerotic stenosis 50% diameter stenosis proximal left internal carotid artery due to atherosclerotic stenosis Moderate stenosis origin right vertebral artery and mild stenosis origin left vertebral artery No emergent large vessel intracranial occlusion. Electronically Signed   By: CFranchot GalloM.D.   On: 11/16/2017 14:23   Mr Brain Wo Contrast (neuro Protocol)  Result Date: 11/16/2017 CLINICAL DATA:  77year old female. Code stroke. Abnormal left upper extremity sensation this morning. EXAM: MRI HEAD WITHOUT CONTRAST TECHNIQUE: Multiplanar, multiecho pulse sequences of the brain and surrounding structures were obtained without intravenous contrast. COMPARISON:  Head CT without contrast 0741 hours. Brain MRI 12/31/2013. FINDINGS: Brain: There are scattered small cortical and subcortical white matter foci of restricted diffusion in the right frontal lobe middle and superior frontal gyri as well as the anterior superior parietal lobe. Peri rolandic  cortex in the region of left upper extremity representation is involved on series 100, image 46. There is minimal associated T2 and FLAIR hyperintensity. No hemorrhage or mass effect. No contralateral left hemisphere, posterior circulation, or posterior fossa restricted diffusion. Mild patchy T2 hyperintensity in the left corona radiata most resembles of a chronic lacunar infarct. No cortical encephalomalacia or chronic cerebral blood products identified. Negative bilateral deep gray matter nuclei, brainstem, and cerebellum. No midline shift, mass effect, evidence of mass lesion, ventriculomegaly, extra-axial collection or acute intracranial hemorrhage. Cervicomedullary junction and pituitary are within normal limits. Vascular: Major intracranial vascular flow voids  are preserved with generalized intracranial artery ectasia. The distal left vertebral artery is dominant. However, there is asymmetric increased STIR signal within a posterior sylvian right MCA branch on series 8, image 29 which is suspicious for slow flow or occlusion. Skull and upper cervical spine: Negative visible cervical spine. Normal bone marrow signal. Sinuses/Orbits: Postoperative changes to both globes. Paranasal sinuses and mastoids are stable and well pneumatized. Other: Visible internal auditory structures appear normal. Scalp and face soft tissues appear negative. IMPRESSION: 1. Positive for scattered small acute infarcts in the Right MCA territory, including the superior right peri rolandic cortex in the area of left upper extremity representation. No associated hemorrhage or mass effect. 2. Possible associated slow flow or occlusion of a posterior right MCA distal M2 or M3 branch. MRA or CTA could evaluate further. 3. Underlying small chronic lacunar infarct of the left corona radiata, but no other chronic ischemic changes identified in the brain. Electronically Signed   By: Genevie Ann M.D.   On: 11/16/2017 11:07   Ct Head Code Stroke Wo Contrast  Addendum Date: 11/16/2017   ADDENDUM REPORT: 11/16/2017 08:21 ADDENDUM: Study discussed by telephone with Dr. Darel Hong on 11/16/2017 at 0803 hours. Electronically Signed   By: Genevie Ann M.D.   On: 11/16/2017 08:21   Result Date: 11/16/2017 CLINICAL DATA:  Code stroke. 77 year old female with abnormal left upper extremity sensation this morning. EXAM: CT HEAD WITHOUT CONTRAST TECHNIQUE: Contiguous axial images were obtained from the base of the skull through the vertex without intravenous contrast. COMPARISON:  Head CT without contrast 01/19/2014. FINDINGS: Brain: Cerebral volume remains normal for age. No midline shift, ventriculomegaly, mass effect, evidence of mass lesion, intracranial hemorrhage or evidence of cortically based acute infarction.  Gray-white matter differentiation is within normal limits throughout the brain. Punctate dystrophic or atherosclerotic calcification along the right middle frontal gyrus (sagittal image 12). Vascular: Calcified atherosclerosis at the skull base. No suspicious intracranial vascular hyperdensity. Skull: Stable, negative. Sinuses : Remain clear. Other: Stable and negative visible orbit and scalp soft tissues. ASPECTS ([place] Level Stroke Program Early CT Score) - Ganglionic level infarction (caudate, lentiform nuclei, internal capsule, insula, M1-M3 cortex): 7 - Supraganglionic infarction (M4-M6 cortex): 3 Total score (0-10 with 10 being normal): 10 IMPRESSION: 1. Stable and normal for age noncontrast CT appearance of the brain. 2. ASPECTS is 10. Electronically Signed: By: Genevie Ann M.D. On: 11/16/2017 07:56    EKG:   Orders placed or performed during the hospital encounter of 11/16/17  . ED EKG  . ED EKG  . EKG 12-Lead  . EKG 12-Lead    IMPRESSION AND PLAN:   Kathleen Walls  is a 77 y.o. female with a known history of coronary artery disease, history of essential hypertension hyperlipidemia carotid artery stenosis and history of breast cancer given to the ED with the left upper extremity and lower extremity weakness.  She was found to be normal until 1 AM today she woke up around 6:00 with left-sided weakness and slurry speech  #Acute CVA with left-sided weakness and dysarthria Admit to MedSurg unit CT head negative but MRI of the brain with right MCA infarct Patient was taking aspirin 81 mg neurology is recommending aspirin 81 mg and Plavix According to the ED nurse patient has passed bedside swallow evaluation Check TSH, hemoglobin A1c and fasting lipid panel CT angiogram of the head and neck given the history of carotid artery stenosis PT OT evaluation Follow-up with neurology  #Essential hypertension Allow permissive hypertension and hold metoprol  #Hyperlipidemia check fasting lipid panel and  start patient on high intensity Lipitor as patient is tolerating p.o.  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 Management plans discussed with the patient, family and they are in agreement.  CODE STATUS:   TOTAL TIME TAKING CARE OF THIS PATIENT: 63mnutes.   Note: This dictation was prepared with Dragon dictation along with smaller phrase technology. Any transcriptional errors that result from this process are unintentional.  ANicholes MangoM.D on 11/16/2017 at 3:48 PM  Between 7am to 6pm - Pager - [phone] After 6pm go to [email or web address] - password [email or web address]  Office  [phone] CC: Primary care physician; SIdelle Crouch MD

## 2017-11-16 NOTE — ED Provider Notes (Signed)
[facility] Emergency Department Provider Note  ____________________________________________   First MD Initiated Contact with Patient 11/16/17 [phone]     (approximate)  I have reviewed the triage vital signs and the nursing notes.   HISTORY  Chief Complaint Weakness (left side)    HPI Kathleen Walls is a 77 y.o. female who comes to the emergency department via EMS with weakness and clumsiness in her left hand that began when she awoke this morning.  She was last normal around 1 AM when she got up to go to the bathroom.  She then went back to sleep and when she awoke this morning she noted decreased function of her left hand and called 911.  She has no pain.  No double vision or blurred vision.  No chest pain or shortness of breath.  No abdominal pain nausea or vomiting.  She has no history of stroke.  Her symptoms began suddenly and have been constant.  Nothing seems to make them better or worse.  Past Medical History:  Diagnosis Date  . Arthritis   . Breast cancer [facility]) 2009    2009 breast cancer stage IIA T2 N0, ER positive, HER-2 negative, 2.5 cm primary tumor, poorly differentiated, status post wide local excision, one sentinel lymph node negative, high-risk Oncotype score 48  . Cataracts, both eyes   . Colitis   . Coronary artery disease   . Detached vitreous humor   . Diverticulitis   . 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 Gastritis   . GERD (gastroesophageal reflux disease)   . 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 Hemorrhoid   . Hemorrhoids   . Hyperlipemia   . 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 Kidney cysts    Left  . Kidney stones   . Lipoma of shoulder 2009   left  . Migraine   . Osteoarthritis   . Osteopenia   . Stented coronary artery     Patient Active Problem List   Diagnosis Date Noted  . Carcinoma of overlapping sites of left breast in female, estrogen receptor positive ([place])  11/17/2016  . Carotid stenosis 09/11/2016  . H/O appendicitis 08/01/2016  . Thyromegaly 12/22/2015  . Chest pain 05/14/2015  . Arteriosclerosis of coronary artery 07/22/2014  . Disorder of mitral valve 01/17/2014  . Absolute anemia 10/23/2013  . Benign hypertension 10/23/2013  . Dermatophytic onychia 10/23/2013  . HLD (hyperlipidemia) 10/23/2013  . Idiopathic peripheral neuropathy 10/23/2013    Past Surgical History:  Procedure Laterality Date  . ABDOMINAL HYSTERECTOMY    . benign breast biopsy  2007  . BREAST BIOPSY Left 2007   positive  . BREAST CYST ASPIRATION Right    neg  . BREAST LUMPECTOMY Left 2009    2009 breast cancer stage IIA T2 N0, ER positive, HER-2 negative, 2.5 cm primary tumor, poorly differentiated, status post wide local excision, one sentinel lymph node negative, high-risk Oncotype score 48  . BREAST SURGERY    . CARDIAC CATHETERIZATION N/A 06/22/2015   Procedure: Left Heart Cath and Coronary Angiography;  Surgeon: Teodoro Spray, MD;  Location: [place] CV LAB;  Service: Cardiovascular;  Laterality: N/A;  . CARDIAC CATHETERIZATION    . CATARACT EXTRACTION    . COLONOSCOPY    . CORONARY ANGIOPLASTY    . EYE SURGERY    . FOOT FRACTURE SURGERY Left   . PARTIAL HYSTERECTOMY    .  UPPER GASTROINTESTINAL ENDOSCOPY      Prior to Admission medications   Medication Sig Start Date End Date Taking? Authorizing Provider  ALPRAZolam (XANAX) 0.25 MG tablet TAKE ONE-HALF TABLET BY MOUTH AT BEDTIME 01/17/15   [provider]  amoxicillin-clavulanate (AUGMENTIN) 875-125 MG tablet Take 1 tablet by mouth 2 (two) times daily. Patient not taking: Reported on 11/17/2016 07/29/16   Hubbard Robinson, MD  aspirin EC 81 MG tablet Take by mouth.    [provider]  Azelaic Acid 15 % cream Apply topically. Reported on 11/18/2015    [provider]  butalbital-aspirin-caffeine Acquanetta Chain) 50-325-40 MG per capsule Take by mouth.    [provider]   calcium carbonate (TUMS - DOSED IN MG ELEMENTAL CALCIUM) 500 MG chewable tablet Chew by mouth.    [provider]  Cholecalciferol (VITAMIN D-1000 MAX ST) 1000 UNITS tablet Take by mouth.    [provider]  docusate sodium (STOOL SOFTENER) 100 MG capsule Take by mouth.    [provider]  donepezil (ARICEPT) 5 MG tablet 10 mg.  08/09/16   [provider]  ezetimibe (ZETIA) 10 MG tablet Take by mouth. 03/01/15   [provider]  gabapentin (NEURONTIN) 300 MG capsule TAKE ONE CAPSULE BY MOUTH THREE TIMES DAILY 01/09/15   [provider]  metoprolol succinate (TOPROL-XL) 25 MG 24 hr tablet Take by mouth. 09/06/15   [provider]  Multiple Vitamin (MULTI-VITAMINS) TABS Take by mouth.    [provider]  nitroGLYCERIN (NITROSTAT) 0.4 MG SL tablet Place 0.4 mg under the tongue every 5 (five) minutes as needed for chest pain.    [provider]  omeprazole (PRILOSEC) 20 MG capsule Take 20 mg by mouth 2 (two) times daily before a meal.    [provider]  sodium chloride (MURO 128) 5 % ophthalmic solution Place 1 drop into the left eye as needed for eye irritation.    [provider]  vancomycin (VANCOCIN) 125 MG capsule  08/21/16   [provider]    Allergies Alendronate; Alendronate sodium; Atorvastatin; Azithromycin; Ciprofloxacin; Colestipol; Lidocaine; Lovastatin; Metronidazole; Niacin; Risedronate; Risedronate sodium; Rosuvastatin; and Simvastatin  Family History  Problem Relation Age of Onset  . Heart disease Mother   . Diabetes Father   . Heart disease Father   . Diabetes Sister   . Diabetes Brother   . Cervical cancer Cousin   . Breast cancer Cousin   . Lung cancer Paternal Aunt   . Prostate cancer Maternal Uncle   . Leukemia Unknown   . Hypertension Unknown     Social History Social History   Tobacco Use  . Smoking status: Former Smoker    Types: Cigarettes    Last  attempt to quit: 08/14/1993    Years since quitting: 24.2  . Smokeless tobacco: Never Used  Substance Use Topics  . Alcohol use: No    Alcohol/week: 0.0 oz  . Drug use: No    Review of Systems Constitutional: No fever/chills Eyes: No visual changes. ENT: No sore throat. Cardiovascular: Denies chest pain. Respiratory: Denies shortness of breath. Gastrointestinal: No abdominal pain.  No nausea, no vomiting.  No diarrhea.  No constipation. Genitourinary: Negative for dysuria. Musculoskeletal: Negative for back pain. Skin: Negative for rash. Neurological: Positive for focal weakness   ____________________________________________   PHYSICAL EXAM:  VITAL SIGNS: ED Triage Vitals  Enc Vitals Group     BP      Pulse  Resp      Temp      Temp src      SpO2      Weight      Height      Head Circumference      Peak Flow      Pain Score      Pain Loc      Pain Edu?      Excl. in [place]?     Constitutional: Alert and oriented x4 nontoxic pleasant cooperative speaks in full clear sentences no diaphoresis Eyes: PERRL EOMI. mid range and brisk Head: Atraumatic. Nose: No congestion/rhinnorhea. Mouth/Throat: No trismus Neck: No stridor.   Cardiovascular: Normal rate, regular rhythm. Grossly normal heart sounds.  Good peripheral circulation. Respiratory: Normal respiratory effort.  No retractions. Lungs CTAB and moving good air Gastrointestinal: Soft nontender Musculoskeletal: No lower extremity edema   Neurologic:  Normal speech and language.  Cranial nerves II through XII intact slight pronator drift on the left with decreased strength left hand otherwise 5 out of 5 throughout and sensation intact light touch throughout Skin:  Skin is warm, dry and intact. No rash noted. Psychiatric: Mood and affect are normal. Speech and behavior are normal.    ____________________________________________   DIFFERENTIAL includes but not limited to  Stroke, dissection, intracerebral  hemorrhage, peripheral neuropathy ____________________________________________   LABS (all labs ordered are listed, but only abnormal results are displayed)  Labs Reviewed  CBC - Abnormal; Notable for the following components:      Result Value   RBC 5.50 (*)    MCH 25.7 (*)    RDW 15.6 (*)    All other components within normal limits  PROTIME-INR  APTT  DIFFERENTIAL  GLUCOSE, CAPILLARY  ETHANOL  COMPREHENSIVE METABOLIC PANEL  TROPONIN I  URINE DRUG SCREEN, QUALITATIVE ([facility] ONLY)  URINALYSIS, ROUTINE W REFLEX MICROSCOPIC    Lab work reviewed by me with no acute disease __________________________________________  EKG  ED ECG REPORT I, Darel Hong, the attending physician, personally viewed and interpreted this ECG.  Date: 11/16/2017 EKG Time:  Rate: 63 Rhythm: normal sinus rhythm QRS Axis: normal Intervals: normal ST/T Wave abnormalities: normal Narrative Interpretation: no evidence of acute ischemia  ____________________________________________  RADIOLOGY  Head CT reviewed by me with no acute disease  ____________________________________________   PROCEDURES  Procedure(s) performed: no  .Critical Care Performed by: Darel Hong, MD Authorized by: Darel Hong, MD   Critical care provider statement:    Critical care time (minutes):  30   Critical care time was exclusive of:  Separately billable procedures and treating other patients   Critical care was necessary to treat or prevent imminent or life-threatening deterioration of the following conditions:  CNS failure or compromise   Critical care was time spent personally by me on the following activities:  Development of treatment plan with patient or surrogate, discussions with consultants, evaluation of patient's response to treatment, examination of patient, obtaining history from patient or surrogate, ordering and performing treatments and interventions, ordering and review of laboratory  studies, ordering and review of radiographic studies, pulse oximetry, re-evaluation of patient's condition and review of old charts    Critical Care performed: Yes  Observation: no ____________________________________________   INITIAL IMPRESSION / ASSESSMENT AND PLAN / ED COURSE  Pertinent labs & imaging results that were available during my care of the patient were reviewed by me and considered in my medical decision making (see chart for details).  The patient arrives via EMS with an NIH  of 1 or so with a wakeup stroke and a last known well around 1 AM.  It is possible this is a peripheral neuropathy, however she clearly has significant strength in her radial nerve making this less likely.  Code stroke activated.      __________----------------------------------------- 8:23 AM on 11/16/2017 -----------------------------------------  I discussed the case with the telemetry neurologist who feels the patient likely had a lacunar infarct and recommends inpatient admission for stroke workup.  I then discussed with the patient and family who verbalized understanding and agreement with the plan.  324 mg of aspirin now. Discussed with the hospitalist who has graciously agreed to admit the patient to her service.  __________________________________   FINAL CLINICAL IMPRESSION(S) / ED DIAGNOSES  Final diagnoses:  Cerebrovascular accident (CVA), unspecified mechanism ([place])      NEW MEDICATIONS STARTED DURING THIS VISIT:  New Prescriptions   No medications on file     Note:  This document was prepared using Dragon voice recognition software and may include unintentional dictation errors.     Darel Hong, MD 11/16/17 [phone]

## 2017-11-16 NOTE — ED Notes (Signed)
Attempted to call report.  RN unavailable to take report at this time.

## 2017-11-16 NOTE — Care Management (Addendum)
This RNCM met with patient's husband regarding MOON letter and role of RNCM.  Dr. Gouru was doing medical assessment with patient when I rounded.  Patient is from home with husband and has allowed him to assist with medical decisions.  Patient converted to inpatient after MOON was delivered. 

## 2017-11-16 NOTE — Progress Notes (Signed)
Dose for prophylactic lovenox was changed from 40mg  to 30mg  daily given patient weight of 42 kg (< 45kg).   Lendon Ka, PharmD Pharmacy Resident

## 2017-11-16 NOTE — Care Management Obs Status (Signed)
[place] NOTIFICATION   Patient Details  Name: Kathleen Walls MRN: [id number] Date of Birth: 1941-07-01   Medicare Observation Status Notification Given:  Yes    Marshell Garfinkel, RN 11/16/2017, 8:47 AM

## 2017-11-16 NOTE — Consult Note (Signed)
[facility] TeleNeurology Consult Services   Asked to see this patient in telemedicine consultation. Consultation was performed with assistance of ancillary / medical staff at bedside. ?   Verbal consent to perform the examination with telemedicine was obtained. Patient and family agreed to proceed with the consultation.   Impression: LUE numbness/weakness  Somewhat acute onset on waking this morning  Last normal when up in the middle of the night, probably ~0100  NIHSS 2 for isolated LUE numbness/weakess  Stroke v peripheral nerve process  Not a tpa candidate due to:  time  Not an NIR candidate due to:  No cortical symptoms/signs to suggest LVO  Differential Diagnosis:   Stroke  TIA  Peripheral nerve injury  Migraine   Metrics:  Arrival time:  731  [facility] contacted:  759  [facility] at bedside:  803  NIHSS assessment time:  806  Recommendations:    Antiplatelet therapy with ASA  DVT Prophylaxis  Dysphagia Screen  Inpatient diagnostic testing to consider includes:  MRI brain, other at discretion of inpatient neurology consultation  Discussed with ED MD     CC:  Stroke Alert  Date of Consult:  11/16/17    HPI:  Patient in usual state of health going to bed last evening at around 2200  Up to use the bathroom overnight, probably last time up and normal per patient was about 0100  When woke up at 0630 immediately noted LUE not feeling right  She describes feeling as if she had no control of the arm  She denies any leg symptoms, and no speech/vision symptoms  To ER for further evaluation  No similar symptoms in the past  Arrived at 731  telestroke eval at 806  NIHSS 2  No tPA due to time  No intervention as no cortical symptoms/signs to suggest LVO   Past Medical History:  Diagnosis Date  . Arthritis   . Breast cancer [facility]) 2009    2009 breast cancer stage IIA T2 N0, ER positive, HER-2 negative, 2.5 cm  primary tumor, poorly differentiated, status post wide local excision, one sentinel lymph node negative, high-risk Oncotype score 48  . Cataracts, both eyes   . Colitis   . Coronary artery disease   . Detached vitreous humor   . Diverticulitis   . 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 Gastritis   . GERD (gastroesophageal reflux disease)   . 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 Hemorrhoid   . Hemorrhoids   . Hyperlipemia   . 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 Kidney cysts    Left  . Kidney stones   . Lipoma of shoulder 2009   left  . Migraine   . Osteoarthritis   . Osteopenia   . Stented coronary artery     Prior to Admission medications   Medication Sig Start Date End Date Taking? Authorizing Provider  ALPRAZolam (XANAX) 0.25 MG tablet TAKE ONE-HALF TABLET BY MOUTH AT BEDTIME 01/17/15   [provider]  amoxicillin-clavulanate (AUGMENTIN) 875-125 MG tablet Take 1 tablet by mouth 2 (two) times daily. Patient not taking: Reported on 11/17/2016 07/29/16   Hubbard Robinson, MD  aspirin EC 81 MG tablet Take by mouth.    [provider]  Azelaic Acid 15 % cream Apply topically. Reported on 11/18/2015    [provider]  butalbital-aspirin-caffeine Acquanetta Chain) 50-325-40 MG per capsule Take by mouth.  [provider]  calcium carbonate (TUMS - DOSED IN MG ELEMENTAL CALCIUM) 500 MG chewable tablet Chew by mouth.    [provider]  Cholecalciferol (VITAMIN D-1000 MAX ST) 1000 UNITS tablet Take by mouth.    [provider]  docusate sodium (STOOL SOFTENER) 100 MG capsule Take by mouth.    [provider]  donepezil (ARICEPT) 5 MG tablet 10 mg.  08/09/16   [provider]  ezetimibe (ZETIA) 10 MG tablet Take by mouth. 03/01/15   [provider]  gabapentin (NEURONTIN) 300 MG capsule TAKE ONE CAPSULE BY MOUTH THREE TIMES DAILY 01/09/15   [provider]   metoprolol succinate (TOPROL-XL) 25 MG 24 hr tablet Take by mouth. 09/06/15   [provider]  Multiple Vitamin (MULTI-VITAMINS) TABS Take by mouth.    [provider]  nitroGLYCERIN (NITROSTAT) 0.4 MG SL tablet Place 0.4 mg under the tongue every 5 (five) minutes as needed for chest pain.    [provider]  omeprazole (PRILOSEC) 20 MG capsule Take 20 mg by mouth 2 (two) times daily before a meal.    [provider]  sodium chloride (MURO 128) 5 % ophthalmic solution Place 1 drop into the left eye as needed for eye irritation.    [provider]  vancomycin (VANCOCIN) 125 MG capsule  08/21/16   [provider]     Allergies  Allergen Reactions  . Alendronate     Other reaction(s): Unknown  . Alendronate Sodium     Other reaction(s): Unknown  . Atorvastatin     Other reaction(s): Muscle Pain Other reaction(s): Muscle Pain  . Azithromycin Hives  . Ciprofloxacin     Other reaction(s): Headache  . Colestipol     Other reaction(s): Unknown  . Lidocaine Hives  . Lovastatin     Other reaction(s): Headache Other reaction(s): Headache  . Metronidazole     Other reaction(s): Headache  . Niacin     Other reaction(s): Unknown  . Risedronate     Other reaction(s): Unknown  . Risedronate Sodium Other (See Comments)  . Rosuvastatin     Other reaction(s): Unknown Other reaction(s): Unknown  . Simvastatin     Other reaction(s): Headache Other reaction(s): Headache    Social History   Tobacco Use  . Smoking status: Former Smoker    Types: Cigarettes    Last attempt to quit: 08/14/1993    Years since quitting: 24.2  . Smokeless tobacco: Never Used  Substance Use Topics  . Alcohol use: No    Alcohol/week: 0.0 oz  . Drug use: No    Family History  Problem Relation Age of Onset  . Heart disease Mother   . Diabetes Father   . Heart disease Father   . Diabetes Sister   . Diabetes Brother   . Cervical cancer Cousin   . Breast  cancer Cousin   . Lung cancer Paternal Aunt   . Prostate cancer Maternal Uncle   . Leukemia Unknown   . Hypertension Unknown         Physical Exam  Vitals:   11/16/17 0810  BP: (!) 188/66  Pulse: 72  Resp: 15  SpO2: 98%     NIHSS  LOC - 0  LOC questions - 0  LOC commands - 0  EOM - 0  VF - 0  Face - 0  Motor Upper Ext - 1  Motor Lower Ext - 0  Coordination - 0  Sensory - 1    Language - 0  Speech - 0  Extinction - 0  NIHSS total - 2   Lab/Data Review  CT Head - reviewed - no acute process      Medical Decision Making:  - Extensive number of diagnosis or management options are considered above.   - Extensive amount of complex data reviewed.   - High risk of complication and/or morbidity or mortality are associated with differential diagnostic considerations above.  - There may be Uncertain outcome and increased probability of prolonged functional impairment or high probability of severe prolonged functional impairment associated with some of these differential diagnosis.  Medical Data Reviewed:  1.Data reviewed include clinical labs, radiology,  Medical Tests;   2.Tests results discussed w/performing or interpreting physician;   3.Obtaining/reviewing old medical records;  4.Obtaining case history from another source;  5.Independent review of image, tracing or specimen.       

## 2017-11-17 ENCOUNTER — Inpatient Hospital Stay
Admit: 2017-11-17 | Discharge: 2017-11-17 | Disposition: A | Discharge status: Home / Self Care | Payer: Medicare Other | Attending: Internal Medicine | Admitting: Internal Medicine

## 2017-11-17 ENCOUNTER — Inpatient Hospital Stay: Payer: Medicare Other

## 2017-11-17 DIAGNOSIS — I6789 Other cerebrovascular disease: Secondary | ICD-10-CM

## 2017-11-17 DIAGNOSIS — I10 Essential (primary) hypertension: Secondary | ICD-10-CM

## 2017-11-17 LAB — LIPID PANEL
CHOL/HDL RATIO: 4.8 ratio
Cholesterol: 271 mg/dL — ABNORMAL HIGH (ref 0–200)
HDL: 57 mg/dL (ref >40)
LDL Cholesterol: 187 mg/dL — ABNORMAL HIGH (ref 0–99)
Triglycerides: 135 mg/dL (ref <150)
VLDL: 27 mg/dL (ref 0–40)

## 2017-11-17 LAB — HEMOGLOBIN A1C
Hgb A1c MFr Bld: 5.9 % — ABNORMAL HIGH (ref 4.8–5.6)
Mean Plasma Glucose: 122.63 mg/dL

## 2017-11-17 LAB — TSH: TSH: 2.4 u[IU]/mL (ref 0.350–4.500)

## 2017-11-17 MED ORDER — SODIUM CHLORIDE 0.9% FLUSH: 3.0000 mL | Freq: Two times a day (BID) | INTRAVENOUS | Status: DC

## 2017-11-17 MED ORDER — OXYCODONE HCL 5 MG PO TABS
5.0000 mg | ORAL_TABLET | Freq: Four times a day (QID) | ORAL | Status: DC | PRN
  Administered 2017-11-17: 5 mg via ORAL
  Filled 2017-11-17: qty 1

## 2017-11-17 MED ORDER — CLOPIDOGREL BISULFATE 75 MG PO TABS: 75.0000 mg | ORAL_TABLET | Freq: Every day | ORAL | 2 refills | Status: DC

## 2017-11-17 MED ORDER — ACETAMINOPHEN 325 MG PO TABS: 650.0000 mg | ORAL_TABLET | Freq: Four times a day (QID) | ORAL | Status: DC | PRN

## 2017-11-17 NOTE — Discharge Summary (Addendum)
[place] [facility] [place] NAME: Kathleen Walls    MR#:  [id number]  DATE OF BIRTH:  1941-04-30  DATE OF ADMISSION:  11/16/2017   ADMITTING PHYSICIAN: Nicholes Mango, MD  DATE OF DISCHARGE:  11/17/2017 PRIMARY CARE PHYSICIAN: Idelle Crouch, MD   ADMISSION DIAGNOSIS:  Cerebrovascular accident (CVA), unspecified mechanism ([place]) [I63.9] Acute cerebrovascular accident (CVA) ([place]) [I63.9] DISCHARGE DIAGNOSIS:  Active Problems:   TIA (transient ischemic attack)   Acute cerebrovascular accident (CVA) ([place])  SECONDARY DIAGNOSIS:   Past Medical History:  Diagnosis Date  . Arthritis   . Breast cancer [facility]) 2009    2009 breast cancer stage IIA T2 N0, ER positive, HER-2 negative, 2.5 cm primary tumor, poorly differentiated, status post wide local excision, one sentinel lymph node negative, high-risk Oncotype score 48  . Cataracts, both eyes   . Colitis   . Coronary artery disease   . Detached vitreous humor   . Diverticulitis   . 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 Gastritis   . GERD (gastroesophageal reflux disease)   . 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 Hemorrhoid   . Hemorrhoids   . Hyperlipemia   . 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 Kidney cysts    Left  . Kidney stones   . Lipoma of shoulder 2009   left  . Migraine   . Osteoarthritis   . Osteopenia   . Stented coronary artery    HOSPITAL COURSE:   Kathleen Walls  is a 77 y.o. female with a known history of coronary artery disease, history of essential hypertension hyperlipidemia carotid artery stenosis and history of breast cancer given to the ED with the left upper extremity and lower extremity weakness.  She was found to be normal until 1 AM today she woke up around 6:00 with left-sided weakness and slurry speech  #Acute CVA with left-sided weakness and dysarthria CT head negative but MRI of the brain with right MCA  infarct Patient was taking aspirin 81 mg neurology is recommending aspirin 81 mg and Plavix The patient has statin intolerance and allergy. On-call neurologist, Dr. Alvira Philips recommends new PCSK9 inhibitor like Praluent. Recommend she see her primary MD to get prescription. Followup in the future with outpatient neurology in 3 months or call earlier.  #Essential hypertension Resume home medication after discharge.  #Hyperlipidemia. LDL 187.  The patient has statin intolerance and allergy. On-call neurologist, Dr. Alvira Philips recommend new PCSK9 inhibitor like Praluent. Recommend she see her primary MD to get prescription.   #History of carotid artery stenosis CT angiogram of the head and neck: Right ICA stenosis of 75%, left ICA stenosis 50%.  Follow-up vascular surgeon as outpatient.  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 PT and OT evaluation suggest no PT or OT follow-up. DISCHARGE CONDITIONS:  Stable, discharged to home today. CONSULTS OBTAINED:   DRUG ALLERGIES:   Allergies  Allergen Reactions  . Alendronate     Other reaction(s): Unknown  . Alendronate Sodium     Other reaction(s): Unknown  . Atorvastatin     Other reaction(s): Muscle Pain Other reaction(s): Muscle Pain  . Azithromycin Hives  . Ciprofloxacin     Other reaction(s): Headache  . Colestipol     Other reaction(s): Unknown  . Lidocaine Hives  . Lovastatin     Other reaction(s): Headache Other reaction(s): Headache  . Metronidazole  Other reaction(s): Headache  . Niacin     Other reaction(s): Unknown  . Risedronate     Other reaction(s): Unknown  . Risedronate Sodium Other (See Comments)  . Rosuvastatin     Other reaction(s): Unknown Other reaction(s): Unknown  . Simvastatin     Other reaction(s): Headache Other reaction(s): Headache   DISCHARGE MEDICATIONS:   Allergies as of 11/17/2017      Reactions   Alendronate    Other reaction(s): Unknown    Alendronate Sodium    Other reaction(s): Unknown   Atorvastatin    Other reaction(s): Muscle Pain Other reaction(s): Muscle Pain   Azithromycin Hives   Ciprofloxacin    Other reaction(s): Headache   Colestipol    Other reaction(s): Unknown   Lidocaine Hives   Lovastatin    Other reaction(s): Headache Other reaction(s): Headache   Metronidazole    Other reaction(s): Headache   Niacin    Other reaction(s): Unknown   Risedronate    Other reaction(s): Unknown   Risedronate Sodium Other (See Comments)   Rosuvastatin    Other reaction(s): Unknown Other reaction(s): Unknown   Simvastatin    Other reaction(s): Headache Other reaction(s): Headache      Medication List    STOP taking these medications   amoxicillin-clavulanate 875-125 MG tablet Commonly known as:  AUGMENTIN     TAKE these medications   ALPRAZolam 0.25 MG tablet Commonly known as:  XANAX TAKE ONE-HALF TABLET BY MOUTH AT BEDTIME   aspirin EC 81 MG tablet Take 81 mg by mouth daily.   Azelaic Acid 15 % cream Apply 1 application topically daily. Reported on 11/18/2015   butalbital-aspirin-caffeine 50-325-40 MG capsule Commonly known as:  FIORINAL Take 1 capsule by mouth every 4 (four) hours as needed for headache.   calcium carbonate 500 MG chewable tablet Commonly known as:  TUMS - dosed in mg elemental calcium Chew 2 tablets by mouth daily.   clopidogrel 75 MG tablet Commonly known as:  PLAVIX Take 1 tablet (75 mg total) by mouth daily. Start taking on:  11/18/2017   donepezil 5 MG tablet Commonly known as:  ARICEPT Take 5 mg by mouth at bedtime.   gabapentin 300 MG capsule Commonly known as:  NEURONTIN TAKE ONE CAPSULE BY MOUTH THREE TIMES DAILY   metoprolol succinate 25 MG 24 hr tablet Commonly known as:  TOPROL-XL Take 25 mg by mouth daily.   nitroGLYCERIN 0.4 MG SL tablet Commonly known as:  NITROSTAT Place 0.4 mg under the tongue every 5 (five) minutes as needed for chest pain.    omeprazole 20 MG capsule Commonly known as:  PRILOSEC Take 20 mg by mouth 2 (two) times daily before a meal.   sodium chloride 5 % ophthalmic solution Commonly known as:  MURO 128 Place 1 drop into the left eye as needed for eye irritation.   STOOL SOFTENER 100 MG capsule Generic drug:  docusate sodium Take 100 mg by mouth daily as needed for mild constipation.   VITAMIN D-1000 MAX ST 1000 units tablet Generic drug:  Cholecalciferol Take 1,000 Units by mouth daily.   ZETIA 10 MG tablet Generic drug:  ezetimibe Take 10 mg by mouth daily.        DISCHARGE INSTRUCTIONS:  See AVS.  If you experience worsening of your admission symptoms, develop shortness of breath, life threatening emergency, suicidal or homicidal thoughts you must seek medical attention immediately by calling 911 or calling your MD immediately  if symptoms less severe.  You Must  read complete instructions/literature along with all the possible adverse reactions/side effects for all the Medicines you take and that have been prescribed to you. Take any new Medicines after you have completely understood and accpet all the possible adverse reactions/side effects.   Please note  You were cared for by a hospitalist during your hospital stay. If you have any questions about your discharge medications or the care you received while you were in the hospital after you are discharged, you can call the unit and asked to speak with the hospitalist on call if the hospitalist that took care of you is not available. Once you are discharged, your primary care physician will handle any further medical issues. Please note that NO REFILLS for any discharge medications will be authorized once you are discharged, as it is imperative that you return to your primary care physician (or establish a relationship with a primary care physician if you do not have one) for your aftercare needs so that they can reassess your need for medications and  monitor your lab values.    On the day of Discharge:  VITAL SIGNS:  Blood pressure (!) 123/53, pulse 62, temperature 98.2 F (36.8 C), temperature source Oral, resp. rate 16, height '5\' 3"'$  (1.6 m), weight 92 lb 14.4 oz (42.1 kg), SpO2 97 %. PHYSICAL EXAMINATION:  GENERAL:  77 y.o.-year-old patient lying in the bed with no acute distress.  EYES: Pupils equal, round, reactive to light and accommodation. No scleral icterus. Extraocular muscles intact.  HEENT: Head atraumatic, normocephalic. Oropharynx and nasopharynx clear.  NECK:  Supple, no jugular venous distention. No thyroid enlargement, no tenderness.  LUNGS: Normal breath sounds bilaterally, no wheezing, rales,rhonchi or crepitation. No use of accessory muscles of respiration.  CARDIOVASCULAR: S1, S2 normal. No murmurs, rubs, or gallops.  ABDOMEN: Soft, non-tender, non-distended. Bowel sounds present. No organomegaly or mass.  EXTREMITIES: No pedal edema, cyanosis, or clubbing.  NEUROLOGIC: Cranial nerves II through XII are intact. Muscle strength 5/5 in all extremities. Sensation intact. Gait not checked.  PSYCHIATRIC: The patient is alert and oriented x 3.  SKIN: No obvious rash, lesion, or ulcer.  DATA REVIEW:   CBC Recent Labs  Lab 11/16/17 0806  WBC 7.0  HGB 14.2  HCT 44.2  PLT 237    Chemistries  Recent Labs  Lab 11/16/17 0806  NA 142  K 4.5  CL 105  CO2 31  GLUCOSE 107*  BUN 19  CREATININE 0.90  CALCIUM 9.5  AST 30  ALT 28  ALKPHOS 83  BILITOT 0.5     Microbiology Results  Results for orders placed or performed during the hospital encounter of 11/16/17  MRSA PCR Screening     Status: None   Collection Time: 11/16/17  4:51 PM  Result Value Ref Range Status   MRSA by PCR NEGATIVE NEGATIVE Final    Comment:        The GeneXpert MRSA Assay (FDA approved for NASAL specimens only), is one component of a comprehensive MRSA colonization surveillance program. It is not intended to diagnose  MRSA infection nor to guide or monitor treatment for MRSA infections. Performed at [facility], [address] [place], [place] [address]     RADIOLOGY:  US Carotid Bilateral (at [facility] Only)  Result Date: 11/17/2017 CLINICAL DATA:  77 year old female with symptoms of transient ischemic attack and bilateral ICA stenosis as identified on recent neck CTA. EXAM: BILATERAL CAROTID DUPLEX ULTRASOUND TECHNIQUE: Pearline Cables scale imaging, color Doppler and  duplex ultrasound were performed of bilateral carotid and vertebral arteries in the neck. COMPARISON:  CTA neck 11/16/2017 FINDINGS: Criteria: Quantification of carotid stenosis is based on velocity parameters that correlate the residual internal carotid diameter with NASCET-based stenosis levels, using the diameter of the distal internal carotid lumen as the denominator for stenosis measurement. The following velocity measurements were obtained: RIGHT ICA:  229/44 cm/sec CCA:  408/14 cm/sec SYSTOLIC ICA/CCA RATIO:  2.1 DIASTOLIC ICA/CCA RATIO:  2.5 ECA:  201 cm/sec LEFT ICA:  155/29 cm/sec CCA:  481/85 cm/sec SYSTOLIC ICA/CCA RATIO:  1.2 DIASTOLIC ICA/CCA RATIO:  1.8 ECA:  188 cm/sec RIGHT CAROTID ARTERY: Heterogeneous and bulky calcified atherosclerotic plaque in the proximal internal carotid artery. By peak systolic velocity criteria, the estimated stenosis is greater than 70%. There is evidence of spectral broadening, high velocity jetting and aliasing. RIGHT VERTEBRAL ARTERY:  Patent with normal antegrade flow. LEFT CAROTID ARTERY: Focal heterogeneous calcified plaque in the proximal internal carotid artery. By peak systolic velocity criteria, the estimated stenosis falls in the 50-69% diameter range. LEFT VERTEBRAL ARTERY:  Patent with normal antegrade flow. IMPRESSION: 1. Severe (70-99%) stenosis proximal right internal carotid artery secondary to bulky, heterogeneous, irregular and partially calcified atherosclerotic plaque. 2. Moderate  (50-69%) stenosis proximal left internal carotid artery secondary to heterogenous atherosclerotic plaque. 3. Vertebral arteries are patent with normal antegrade flow. 4. Sonographic findings are concordant with recent CTA neck. Signed, Criselda Peaches, MD [facility] Electronically Signed   By: Jacqulynn Cadet M.D.   On: 11/17/2017 08:38     Management plans discussed with the patient, her husband and they are in agreement.  CODE STATUS: Full Code   TOTAL TIME TAKING CARE OF THIS PATIENT: 36 minutes.    Demetrios Loll M.D on 11/17/2017 at 3:50 PM  Between 7am to 6pm - Pager - [phone]  After 6pm go to [email or web address] - Proofreader  [facility]  Office  [phone]  CC: Primary care physician; Idelle Crouch, MD   Note: This dictation was prepared with Dragon dictation along with smaller phrase technology. Any transcriptional errors that result from this process are unintentional.

## 2017-11-17 NOTE — Consult Note (Signed)
[facility] Vascular Consult Note  MRN : [id number]  Kathleen Walls is a 77 y.o. (1940-12-25) female who presents with chief complaint of  Chief Complaint  Patient presents with  . Weakness    left side   History of Present Illness:  The patient is a 77 year old female with a past medical history of anemia, hypertensive, coronary artery disease, hyperlipidemia, neuropathy, breast cancer and carotid stenosis known to our practice.  The patient was last seen in our office on August 27, 2017 for evaluation of her known carotid artery disease.   The patient endorses a history of waking up on friday morning unable to get out of bed noting that her left upper extremity was weak.  The patient called EMS and was evaluated at [facility].  Prior to this occurrence, the patient denies any amaurosis fugax or neurological issues.  MRI conducted on November 15, 2017 was notable for scattered small acute infarcts in the Right MCA territory, including the superior right peri rolandic cortex in the area of left upper extremity representation. No associated hemorrhage or mass effect. Possible associated slow flow or occlusion of a posterior right MCA distal M2 or M3 branch. MRA or CTA could evaluate further. Underlying small chronic lacunar infarct of the left corona radiata, but no other chronic ischemic changes identified in the brain.  CTA of the neck conducted on November 16, 2017 was notable for 75% diameter stenosis proximal right internal carotid artery due to atherosclerotic stenosis. 50% diameter stenosis proximal left internal carotid artery due to atherosclerotic stenosis. Moderate stenosis origin right vertebral artery and mild stenosis origin left vertebral artery. No emergent large vessel intracranial occlusion.  The patient notes her symptoms have since resolved.  The patient continues to deny any amaurosis fugax or any new/worsening neurological  deficits.  Patient denies any shortness of breath or chest pain.  Patient denies any fever, nausea vomiting.  The patient is currently on aspirin and Plavix.  Vascular surgery was consulted by Dr. Bridgett Larsson for further recommendations.  Current Facility-Administered Medications  Medication Dose Route Frequency Provider Last Rate Last Dose  .  stroke: mapping our early stages of recovery book   Does not apply Once Gouru, Aruna, MD      . acetaminophen (TYLENOL) tablet 650 mg  650 mg Oral Q6H PRN Lance Coon, MD      . ALPRAZolam Duanne Moron) tablet 0.125 mg  0.125 mg Oral QHS Gouru, Illene Silver, MD   0.125 mg at 11/16/17 2205  . aspirin EC tablet 81 mg  81 mg Oral Daily Gouru, Aruna, MD      . aspirin suppository 300 mg  300 mg Rectal Daily Gouru, Aruna, MD       Or  . aspirin tablet 325 mg  325 mg Oral Daily Gouru, Aruna, MD   325 mg at 11/17/17 0946  . butalbital-acetaminophen-caffeine (FIORICET, ESGIC) 50-325-40 MG per tablet 1 tablet  1 tablet Oral Q4H PRN Gouru, Aruna, MD      . calcium carbonate (TUMS - dosed in mg elemental calcium) chewable tablet 400 mg of elemental calcium  2 tablet Oral Daily Gouru, Aruna, MD   400 mg of elemental calcium at 11/17/17 0946  . cholecalciferol (VITAMIN D) tablet 1,000 Units  1,000 Units Oral Daily Nicholes Mango, MD   1,000 Units at 11/17/17 0946  . clopidogrel (PLAVIX) tablet 75 mg  75 mg Oral Daily Gouru, Aruna, MD   75 mg at 11/17/17 0946  .  docusate sodium (COLACE) capsule 100 mg  100 mg Oral Daily PRN Gouru, Aruna, MD      . donepezil (ARICEPT) tablet 5 mg  5 mg Oral QHS Gouru, Aruna, MD   5 mg at 11/16/17 2206  . enoxaparin (LOVENOX) injection 30 mg  30 mg Subcutaneous Q24H Gouru, Aruna, MD   30 mg at 11/16/17 2205  . ezetimibe (ZETIA) tablet 10 mg  10 mg Oral Daily Gouru, Aruna, MD   10 mg at 11/17/17 0946  . gabapentin (NEURONTIN) capsule 300 mg  300 mg Oral TID Nicholes Mango, MD   300 mg at 11/17/17 0946  . nitroGLYCERIN (NITROSTAT) SL tablet 0.4 mg  0.4 mg  Sublingual Q5 min PRN Gouru, Aruna, MD      . oxyCODONE (Oxy IR/ROXICODONE) immediate release tablet 5 mg  5 mg Oral Q6H PRN Lance Coon, MD   5 mg at 11/17/17 0223  . pantoprazole (PROTONIX) EC tablet 40 mg  40 mg Oral Daily Gouru, Aruna, MD   40 mg at 11/16/17 1955  . senna-docusate (Senokot-S) tablet 1 tablet  1 tablet Oral QHS PRN Gouru, Aruna, MD      . sodium chloride (MURO 128) 5 % ophthalmic solution 1 drop  1 drop Left Eye PRN Gouru, Aruna, MD      . sodium chloride flush (NS) 0.9 % injection 3 mL  3 mL Intravenous Q12H Demetrios Loll, MD       Past Medical History:  Diagnosis Date  . Arthritis   . Breast cancer [facility]) 2009    2009 breast cancer stage IIA T2 N0, ER positive, HER-2 negative, 2.5 cm primary tumor, poorly differentiated, status post wide local excision, one sentinel lymph node negative, high-risk Oncotype score 48  . Cataracts, both eyes   . Colitis   . Coronary artery disease   . Detached vitreous humor   . Diverticulitis   . 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 Gastritis   . GERD (gastroesophageal reflux disease)   . 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 Hemorrhoid   . Hemorrhoids   . Hyperlipemia   . 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 Kidney cysts    Left  . Kidney stones   . Lipoma of shoulder 2009   left  . Migraine   . Osteoarthritis   . Osteopenia   . Stented coronary artery    Past Surgical History:  Procedure Laterality Date  . ABDOMINAL HYSTERECTOMY    . benign breast biopsy  2007  . BREAST BIOPSY Left 2007   positive  . BREAST CYST ASPIRATION Right    neg  . BREAST LUMPECTOMY Left 2009    2009 breast cancer stage IIA T2 N0, ER positive, HER-2 negative, 2.5 cm primary tumor, poorly differentiated, status post wide local excision, one sentinel lymph node negative, high-risk Oncotype score 48  . BREAST SURGERY    . CARDIAC CATHETERIZATION N/A 06/22/2015   Procedure: Left Heart Cath and Coronary  Angiography;  Surgeon: Teodoro Spray, MD;  Location: [facility];  Service: Cardiovascular;  Laterality: N/A;  . CARDIAC CATHETERIZATION    . CATARACT EXTRACTION    . COLONOSCOPY    . CORONARY ANGIOPLASTY    . EYE SURGERY    . FOOT FRACTURE SURGERY Left   . PARTIAL HYSTERECTOMY    . UPPER GASTROINTESTINAL ENDOSCOPY     Social History Social History   Tobacco  Use  . Smoking status: Former Smoker    Types: Cigarettes    Last attempt to quit: 08/14/1993    Years since quitting: 24.2  . Smokeless tobacco: Never Used  Substance Use Topics  . Alcohol use: No    Alcohol/week: 0.0 oz  . Drug use: No   Family History Family History  Problem Relation Age of Onset  . Heart disease Mother   . Diabetes Father   . Heart disease Father   . Diabetes Sister   . Diabetes Brother   . Cervical cancer Cousin   . Breast cancer Cousin   . Lung cancer Paternal Aunt   . Prostate cancer Maternal Uncle   . Leukemia Unknown   . Hypertension Unknown   Patient denies any family history of peripheral artery disease, venous disease or bleeding clotting disorder.  Allergies  Allergen Reactions  . Alendronate     Other reaction(s): Unknown  . Alendronate Sodium     Other reaction(s): Unknown  . Atorvastatin     Other reaction(s): Muscle Pain Other reaction(s): Muscle Pain  . Azithromycin Hives  . Ciprofloxacin     Other reaction(s): Headache  . Colestipol     Other reaction(s): Unknown  . Lidocaine Hives  . Lovastatin     Other reaction(s): Headache Other reaction(s): Headache  . Metronidazole     Other reaction(s): Headache  . Niacin     Other reaction(s): Unknown  . Risedronate     Other reaction(s): Unknown  . Risedronate Sodium Other (See Comments)  . Rosuvastatin     Other reaction(s): Unknown Other reaction(s): Unknown  . Simvastatin     Other reaction(s): Headache Other reaction(s): Headache   REVIEW OF SYSTEMS (Negative unless checked)  Constitutional: '[]'$ Weight  loss  '[]'$ Fever  '[]'$ Chills Cardiac: '[]'$ Chest pain   '[]'$ Chest pressure   '[]'$ Palpitations   '[]'$ Shortness of breath when laying flat   '[]'$ Shortness of breath at rest   '[]'$ Shortness of breath with exertion. Vascular:  '[]'$ Pain in legs with walking   '[]'$ Pain in legs at rest   '[]'$ Pain in legs when laying flat   '[]'$ Claudication   '[]'$ Pain in feet when walking  '[]'$ Pain in feet at rest  '[]'$ Pain in feet when laying flat   '[]'$ History of DVT   '[]'$ Phlebitis   '[]'$ Swelling in legs   '[]'$ Varicose veins   '[]'$ Non-healing ulcers Pulmonary:   '[]'$ Uses home oxygen   '[]'$ Productive cough   '[]'$ Hemoptysis   '[]'$ Wheeze  '[]'$ COPD   '[]'$ Asthma Neurologic:  '[]'$ Dizziness  '[]'$ Blackouts   '[]'$ Seizures   '[x]'$ History of stroke   '[x]'$ History of TIA  '[]'$ Aphasia   '[]'$ Temporary blindness   '[]'$ Dysphagia   '[x]'$ Weakness or numbness in arms   '[]'$ Weakness or numbness in legs Musculoskeletal:  '[]'$ Arthritis   '[]'$ Joint swelling   '[]'$ Joint pain   '[]'$ Low back pain Hematologic:  '[]'$ Easy bruising  '[]'$ Easy bleeding   '[]'$ Hypercoagulable state   '[]'$ Anemic  '[]'$ Hepatitis Gastrointestinal:  '[]'$ Blood in stool   '[]'$ Vomiting blood  '[]'$ Gastroesophageal reflux/heartburn   '[]'$ Difficulty swallowing. Genitourinary:  '[]'$ Chronic kidney disease   '[]'$ Difficult urination  '[]'$ Frequent urination  '[]'$ Burning with urination   '[]'$ Blood in urine Skin:  '[]'$ Rashes   '[]'$ Ulcers   '[]'$ Wounds Psychological:  '[]'$ History of anxiety   '[]'$  History of major depression.  Physical Examination  Vitals:   11/16/17 2312 11/17/17 0248 11/17/17 0615 11/17/17 0900  BP: (!) 161/62 (!) 111/52 (!) 103/51 (!) 123/53  Pulse: 70 61 65 62  Resp: '17 18 17 16  '$ Temp: 98.5 F (36.9 C) 98.2  F (36.8 C) 98.2 F (36.8 C) 98.2 F (36.8 C)  TempSrc: Oral Oral Oral Oral  SpO2:  97% 95% 97%  Weight: 92 lb 14.4 oz (42.1 kg)     Height: '5\' 3"'$  (1.6 m)      Body mass index is 16.46 kg/m. Gen:  WD/WN, NAD Head: [place]/AT, No temporalis wasting. Prominent temp pulse not noted. Ear/Nose/Throat: Hearing grossly intact, nares w/o erythema or drainage, oropharynx w/o  Erythema/Exudate.  Tongue is midline. Eyes: Sclera non-icteric, conjunctiva clear Neck: Trachea midline.  No JVD.  Mild right-sided bruit noted. Pulmonary:  Good air movement, respirations not labored, equal bilaterally.  Cardiac: RRR, normal S1, S2. Vascular:  Vessel Right Left  Radial Palpable Palpable  Ulnar Palpable Palpable  Brachial Palpable Palpable  Carotid Palpable, without bruit Palpable, without bruit  Aorta Not palpable N/A  Femoral Palpable Palpable  Popliteal Palpable Palpable  PT Palpable Palpable  DP Palpable Palpable   Gastrointestinal: soft, non-tender/non-distended. No guarding/reflex.  Musculoskeletal: M/S 5/5 throughout.  Extremities without ischemic changes.  No deformity or atrophy. No edema. Neurologic: Sensation grossly intact in extremities.  Symmetrical.  Speech is fluent. Motor exam as listed above. Psychiatric: Judgment intact, Mood & affect appropriate for pt's clinical situation. Dermatologic: No rashes or ulcers noted.  No cellulitis or open wounds. Lymph : No Cervical, Axillary, or Inguinal lymphadenopathy.  CBC Lab Results  Component Value Date   WBC 7.0 11/16/2017   HGB 14.2 11/16/2017   HCT 44.2 11/16/2017   MCV 80.3 11/16/2017   PLT 237 11/16/2017   BMET    Component Value Date/Time   NA 142 11/16/2017 0806   NA 143 12/31/2013 0406   K 4.5 11/16/2017 0806   K 4.5 12/31/2013 0406   CL 105 11/16/2017 0806   CL 111 (H) 12/31/2013 0406   CO2 31 11/16/2017 0806   CO2 28 12/31/2013 0406   GLUCOSE 107 (H) 11/16/2017 0806   GLUCOSE 115 (H) 12/31/2013 0406   BUN 19 11/16/2017 0806   BUN 10 12/31/2013 0406   CREATININE 0.90 11/16/2017 0806   CREATININE 0.87 12/31/2013 0406   CALCIUM 9.5 11/16/2017 0806   CALCIUM 8.6 12/31/2013 0406   GFRNONAA >60 11/16/2017 0806   GFRNONAA >60 12/31/2013 0406   GFRAA >60 11/16/2017 0806   GFRAA >60 12/31/2013 0406   Estimated Creatinine Clearance: 35.3 mL/min (by C-G formula based on SCr of 0.9  mg/dL).  COAG Lab Results  Component Value Date   INR 0.90 11/16/2017   Radiology Ct Angio Head W Or Wo Contrast  Result Date: 11/16/2017 CLINICAL DATA:  Stroke EXAM: CT ANGIOGRAPHY HEAD AND NECK TECHNIQUE: Multidetector CT imaging of the head and neck was performed using the standard protocol during bolus administration of intravenous contrast. Multiplanar CT image reconstructions and MIPs were obtained to evaluate the vascular anatomy. Carotid stenosis measurements (when applicable) are obtained utilizing NASCET criteria, using the distal internal carotid diameter as the denominator. CONTRAST:  95m OMNIPAQUE IOHEXOL 350 MG/ML SOLN COMPARISON:  MRI head 11/16/2017 FINDINGS: CTA NECK FINDINGS Aortic arch: Moderate atherosclerotic calcification in the aortic arch and proximal great vessels. Mild stenosis left subclavian artery. Remaining great vessels widely patent. Right carotid system: Right common carotid artery patent. Calcified and noncalcified plaque at the right carotid bifurcation. 75% diameter stenosis proximal right internal carotid artery. 50% diameter stenosis proximal right external carotid artery. Left carotid system: Left common carotid artery widely patent. Calcified plaque at the left carotid bifurcation narrowing the lumen by 50% diameter stenosis.  Vertebral arteries: Moderate to severe stenosis origin of right vertebral artery. Rest of the right vertebral artery widely patent to the basilar Mild stenosis origin of left vertebral artery. Left vertebral artery dominant without additional stenosis. Skeleton: Cervical spondylosis.  No acute skeletal abnormality. Other neck: Negative for mass or adenopathy. Upper chest: Negative Review of the MIP images confirms the above findings CTA HEAD FINDINGS Anterior circulation: Atherosclerotic calcification in the cavernous carotid bilaterally without significant stenosis. Anterior and middle cerebral arteries patent without significant stenosis or  large vessel occlusion Posterior circulation: Both vertebral arteries contribute to the basilar without stenosis. Basilar widely patent. PICA, superior cerebellar, and posterior cerebral arteries widely patent without stenosis. Venous sinuses: Patent Anatomic variants: None Delayed phase: Normal enhancement on delayed imaging Review of the MIP images confirms the above findings IMPRESSION: 75% diameter stenosis proximal right internal carotid artery due to atherosclerotic stenosis 50% diameter stenosis proximal left internal carotid artery due to atherosclerotic stenosis Moderate stenosis origin right vertebral artery and mild stenosis origin left vertebral artery No emergent large vessel intracranial occlusion. Electronically Signed   By: Franchot Gallo M.D.   On: 11/16/2017 14:23   Ct Angio Neck W Or Wo Contrast  Result Date: 11/16/2017 CLINICAL DATA:  Stroke EXAM: CT ANGIOGRAPHY HEAD AND NECK TECHNIQUE: Multidetector CT imaging of the head and neck was performed using the standard protocol during bolus administration of intravenous contrast. Multiplanar CT image reconstructions and MIPs were obtained to evaluate the vascular anatomy. Carotid stenosis measurements (when applicable) are obtained utilizing NASCET criteria, using the distal internal carotid diameter as the denominator. CONTRAST:  68m OMNIPAQUE IOHEXOL 350 MG/ML SOLN COMPARISON:  MRI head 11/16/2017 FINDINGS: CTA NECK FINDINGS Aortic arch: Moderate atherosclerotic calcification in the aortic arch and proximal great vessels. Mild stenosis left subclavian artery. Remaining great vessels widely patent. Right carotid system: Right common carotid artery patent. Calcified and noncalcified plaque at the right carotid bifurcation. 75% diameter stenosis proximal right internal carotid artery. 50% diameter stenosis proximal right external carotid artery. Left carotid system: Left common carotid artery widely patent. Calcified plaque at the left carotid  bifurcation narrowing the lumen by 50% diameter stenosis. Vertebral arteries: Moderate to severe stenosis origin of right vertebral artery. Rest of the right vertebral artery widely patent to the basilar Mild stenosis origin of left vertebral artery. Left vertebral artery dominant without additional stenosis. Skeleton: Cervical spondylosis.  No acute skeletal abnormality. Other neck: Negative for mass or adenopathy. Upper chest: Negative Review of the MIP images confirms the above findings CTA HEAD FINDINGS Anterior circulation: Atherosclerotic calcification in the cavernous carotid bilaterally without significant stenosis. Anterior and middle cerebral arteries patent without significant stenosis or large vessel occlusion Posterior circulation: Both vertebral arteries contribute to the basilar without stenosis. Basilar widely patent. PICA, superior cerebellar, and posterior cerebral arteries widely patent without stenosis. Venous sinuses: Patent Anatomic variants: None Delayed phase: Normal enhancement on delayed imaging Review of the MIP images confirms the above findings IMPRESSION: 75% diameter stenosis proximal right internal carotid artery due to atherosclerotic stenosis 50% diameter stenosis proximal left internal carotid artery due to atherosclerotic stenosis Moderate stenosis origin right vertebral artery and mild stenosis origin left vertebral artery No emergent large vessel intracranial occlusion. Electronically Signed   By: CFranchot GalloM.D.   On: 11/16/2017 14:23   Mr Brain Wo Contrast (neuro Protocol)  Result Date: 11/16/2017 CLINICAL DATA:  77year old female. Code stroke. Abnormal left upper extremity sensation this morning. EXAM: MRI HEAD WITHOUT CONTRAST TECHNIQUE: Multiplanar, multiecho  pulse sequences of the brain and surrounding structures were obtained without intravenous contrast. COMPARISON:  Head CT without contrast 0741 hours. Brain MRI 12/31/2013. FINDINGS: Brain: There are scattered  small cortical and subcortical white matter foci of restricted diffusion in the right frontal lobe middle and superior frontal gyri as well as the anterior superior parietal lobe. Peri rolandic cortex in the region of left upper extremity representation is involved on series 100, image 46. There is minimal associated T2 and FLAIR hyperintensity. No hemorrhage or mass effect. No contralateral left hemisphere, posterior circulation, or posterior fossa restricted diffusion. Mild patchy T2 hyperintensity in the left corona radiata most resembles of a chronic lacunar infarct. No cortical encephalomalacia or chronic cerebral blood products identified. Negative bilateral deep gray matter nuclei, brainstem, and cerebellum. No midline shift, mass effect, evidence of mass lesion, ventriculomegaly, extra-axial collection or acute intracranial hemorrhage. Cervicomedullary junction and pituitary are within normal limits. Vascular: Major intracranial vascular flow voids are preserved with generalized intracranial artery ectasia. The distal left vertebral artery is dominant. However, there is asymmetric increased STIR signal within a posterior sylvian right MCA branch on series 8, image 29 which is suspicious for slow flow or occlusion. Skull and upper cervical spine: Negative visible cervical spine. Normal bone marrow signal. Sinuses/Orbits: Postoperative changes to both globes. Paranasal sinuses and mastoids are stable and well pneumatized. Other: Visible internal auditory structures appear normal. Scalp and face soft tissues appear negative. IMPRESSION: 1. Positive for scattered small acute infarcts in the Right MCA territory, including the superior right peri rolandic cortex in the area of left upper extremity representation. No associated hemorrhage or mass effect. 2. Possible associated slow flow or occlusion of a posterior right MCA distal M2 or M3 branch. MRA or CTA could evaluate further. 3. Underlying small chronic  lacunar infarct of the left corona radiata, but no other chronic ischemic changes identified in the brain. Electronically Signed   By: Genevie Ann M.D.   On: 11/16/2017 11:07   US Carotid Bilateral (at [facility] Only)  Result Date: 11/17/2017 CLINICAL DATA:  77 year old female with symptoms of transient ischemic attack and bilateral ICA stenosis as identified on recent neck CTA. EXAM: BILATERAL CAROTID DUPLEX ULTRASOUND TECHNIQUE: Pearline Cables scale imaging, color Doppler and duplex ultrasound were performed of bilateral carotid and vertebral arteries in the neck. COMPARISON:  CTA neck 11/16/2017 FINDINGS: Criteria: Quantification of carotid stenosis is based on velocity parameters that correlate the residual internal carotid diameter with NASCET-based stenosis levels, using the diameter of the distal internal carotid lumen as the denominator for stenosis measurement. The following velocity measurements were obtained: RIGHT ICA:  229/44 cm/sec CCA:  706/23 cm/sec SYSTOLIC ICA/CCA RATIO:  2.1 DIASTOLIC ICA/CCA RATIO:  2.5 ECA:  201 cm/sec LEFT ICA:  155/29 cm/sec CCA:  762/83 cm/sec SYSTOLIC ICA/CCA RATIO:  1.2 DIASTOLIC ICA/CCA RATIO:  1.8 ECA:  188 cm/sec RIGHT CAROTID ARTERY: Heterogeneous and bulky calcified atherosclerotic plaque in the proximal internal carotid artery. By peak systolic velocity criteria, the estimated stenosis is greater than 70%. There is evidence of spectral broadening, high velocity jetting and aliasing. RIGHT VERTEBRAL ARTERY:  Patent with normal antegrade flow. LEFT CAROTID ARTERY: Focal heterogeneous calcified plaque in the proximal internal carotid artery. By peak systolic velocity criteria, the estimated stenosis falls in the 50-69% diameter range. LEFT VERTEBRAL ARTERY:  Patent with normal antegrade flow. IMPRESSION: 1. Severe (70-99%) stenosis proximal right internal carotid artery secondary to bulky, heterogeneous, irregular and partially calcified atherosclerotic plaque. 2. Moderate  (  50-69%) stenosis proximal left internal carotid artery secondary to heterogenous atherosclerotic plaque. 3. Vertebral arteries are patent with normal antegrade flow. 4. Sonographic findings are concordant with recent CTA neck. Signed, Criselda Peaches, MD [facility] Electronically Signed   By: Jacqulynn Cadet M.D.   On: 11/17/2017 08:38   Ct Head Code Stroke Wo Contrast  Addendum Date: 11/16/2017   ADDENDUM REPORT: 11/16/2017 08:21 ADDENDUM: Study discussed by telephone with Dr. Darel Hong on 11/16/2017 at 0803 hours. Electronically Signed   By: Genevie Ann M.D.   On: 11/16/2017 08:21   Result Date: 11/16/2017 CLINICAL DATA:  Code stroke. 77 year old female with abnormal left upper extremity sensation this morning. EXAM: CT HEAD WITHOUT CONTRAST TECHNIQUE: Contiguous axial images were obtained from the base of the skull through the vertex without intravenous contrast. COMPARISON:  Head CT without contrast 01/19/2014. FINDINGS: Brain: Cerebral volume remains normal for age. No midline shift, ventriculomegaly, mass effect, evidence of mass lesion, intracranial hemorrhage or evidence of cortically based acute infarction. Gray-white matter differentiation is within normal limits throughout the brain. Punctate dystrophic or atherosclerotic calcification along the right middle frontal gyrus (sagittal image 12). Vascular: Calcified atherosclerosis at the skull base. No suspicious intracranial vascular hyperdensity. Skull: Stable, negative. Sinuses : Remain clear. Other: Stable and negative visible orbit and scalp soft tissues. ASPECTS ([place] Stroke Program Early CT Score) - Ganglionic level infarction (caudate, lentiform nuclei, internal capsule, insula, M1-M3 cortex): 7 - Supraganglionic infarction (M4-M6 cortex): 3 Total score (0-10 with 10 being normal): 10 IMPRESSION: 1. Stable and normal for age noncontrast CT appearance of the brain. 2. ASPECTS is  10. Electronically Signed: By: Genevie Ann M.D. On: 11/16/2017 07:56   Assessment/Plan The patient is a 76 year old female with a past medical history of anemia, hypertensive, coronary artery disease, hyperlipidemia, neuropathy, breast cancer and carotid stenosis known to our practice.  The patient was last seen in our office on August 27, 2017 for evaluation of her known carotid artery disease.  Patient was admitted and found to have acute CVA.  1. Carotid Stenosis with acute CVA: CTA of the neck completed on November 16, 2017.  The patient's symptoms have resolved.  The patient is currently on aspirin and Plavix.  The patient has been seen by Dr. Delana Meyer in our office for regular follow-up of her carotid disease. The patient is being discharged today.  I will have the patient follow-up in our office next week to discuss her CTA of the neck with Dr. Delana Meyer and possible options for moving forward with repair.  The patient is to continue taking aspirin and Plavix.  2.  Hyperlipidemia: Encouraged good control as its slows the progression of atherosclerotic disease 3.  Hypertension: Encouraged good control as its slows the progression of atherosclerotic disease  Discussed with Dr. Mayme Genta, PA-C  11/17/2017 4:05 PM  This note was created with Dragon medical transcription system.  Any error is purely unintentional.

## 2017-11-17 NOTE — Evaluation (Signed)
Physical Therapy Evaluation Patient Details Name: Kathleen Walls MRN: [id number] DOB: October 25, 1940 Today's Date: 11/17/2017   History of Present Illness  Kathleen Walls  is a 77 y.o. female with a known history of coronary artery disease, history of essential hypertension hyperlipidemia carotid artery stenosis and history of breast cancer given to the ED with the left upper extremity and lower extremity weakness.  She was found to be normal until 1 AM today she woke up around 6:00 with left-sided weakness and slurry speech.  Came into the ED CT head is negative seen by telemetry neurologist, not recommending TPA.  MRI of the brain was ordered and hospitalist team was called to admit the patient.  Denies any headache or blurry vision.  Denies any falls.  Clinical Impression  Pt admitted with above diagnosis. Pt currently with functional limitations due to the deficits listed below (see "PT Problem List"). Upon entry, the patient is received seated in bed, husband Kathleen Walls present. The pt is awake and agreeable to participate. No acute distress noted at this time.  Functional mobility assessment demonstrates modified independence with all activity (bed mobility, transfers, and AMB in hall up to 454f). Pt has mild gait impairment, which is reported as baseline, without acute deficit. No falls history. No prior use of assistive device. Patient is at baseline, all education completed, and time is given to address all questions/concerns. No additional skilled PT services needed at this time, PT signing off. PT recommends daily ambulation ad lib Walls husband or with nursing staff as needed to prevent deconditioning.      Follow Up Recommendations No PT follow up    Equipment Recommendations  None recommended by PT    Recommendations for Other Services       Precautions / Restrictions Precautions Precautions: Fall Restrictions Weight Bearing Restrictions: No      Mobility  Bed Mobility Overal bed  mobility: Independent                Transfers Overall transfer level: Independent                  Ambulation/Gait Ambulation/Gait assistance: Supervision Ambulation Distance (Feet): 400 Feet Assistive device: None     Gait velocity interpretation: <1.8 ft/sec, indicative of risk for recurrent falls General Gait Details: som eintermittent drifting; pt/husband report as baseline, 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   Stairs            Wheelchair Mobility    Modified Rankin (Stroke Patients Only)       Balance Overall balance assessment: Modified Independent(uses a railing or husabdns hand at baseline)                                           Pertinent Vitals/Pain Pain Assessment: No/denies pain    Home Living Family/patient expects to be discharged to:: Private residence Living Arrangements: Spouse/significant other Available Help at Discharge: Family Type of Home: House Home Access: Stairs to enter   ETechnical brewerof Steps: 2 Home Layout: One level Home Equipment: None      Prior Function Level of Independence: Independent         Comments: Patient does report she has had memory issues for the last couple of years.      Hand Dominance   Dominant Hand: Right    Extremity/Trunk Assessment   Upper Extremity Assessment Upper Extremity Assessment:  Overall Kathleen Walls for tasks assessed    Lower Extremity Assessment Lower Extremity Assessment: Overall WFL for tasks assessed    Cervical / Trunk Assessment Cervical / Trunk Assessment: Normal  Communication   Communication: No difficulties  Cognition Arousal/Alertness: Awake/alert Behavior During Therapy: WFL for tasks assessed/performed Overall Cognitive Status: History of cognitive impairments - at baseline                                 General Comments: Patient with memory impairments prior to event and appears at baseline      General  Comments      Exercises     Assessment/Plan    PT Assessment All further PT needs can be met in the next venue of care  PT Problem List         PT Treatment Interventions      PT Goals (Current goals can be found in the Care Plan section)  Acute Rehab PT Goals Patient Stated Goal: to go home with husband, be independent PT Goal Formulation: All assessment and education complete, DC therapy    Frequency     Barriers to discharge        Co-evaluation               AM-PAC PT "6 Clicks" Daily Activity  Outcome Measure Difficulty turning over in bed (including adjusting bedclothes, sheets and blankets)?: None Difficulty moving from lying on back to sitting on the side of the bed? : A Little Difficulty sitting down on and standing up from a chair with arms (e.g., wheelchair, bedside commode, etc,.)?: A Little Help needed moving to and from a bed to chair (including a wheelchair)?: A Little Help needed walking in Walls room?: A Little Help needed climbing 3-5 steps with a railing? : A Little 6 Click Score: 19    End of Session Equipment Utilized During Treatment: Gait belt Activity Tolerance: Patient tolerated treatment well Patient left: in bed;with family/visitor present;with call bell/phone within reach Nurse Communication: Mobility status PT Visit Diagnosis: Other symptoms and signs involving the nervous system (R29.898)    Time: 4707-6151 PT Time Calculation (min) (ACUTE ONLY): 17 min   Charges:   PT Evaluation $PT Eval Moderate Complexity: 1 Mod     PT G Codes:        1:24 PM, Dec 12, 2017 Kathleen Walls, PT, DPT Physical Therapist - Kathleen Walls  [phone] ([place])     Kathleen Walls Dec 12, 2017, 1:22 PM

## 2017-11-17 NOTE — Discharge Instructions (Signed)
Heart healthy diet. Continue ASA nad Plavix for secondary stroke prevention and maintain strict control of hypertension with blood pressure goal below 130/90, diabetes with hemoglobin A1c goal below 6.5% and lipids with LDL cholesterol goal below 70 mg/dL.Patient has h/o statin intolerance hence recommend new PCSK9 inhibitor like Praluent. Recommend she see her primary MD to get prescription. Followup in the future with outpatient neurology in 3 months or call earlier.

## 2017-11-17 NOTE — Progress Notes (Signed)
Chart reviewed. Spoke to Pt and Nsg. No speech language or swallowing deficits. No ST eval indicated at this time.

## 2017-11-17 NOTE — Evaluation (Signed)
Occupational Therapy Evaluation Patient Details Name: JAMESHIA HAYASHIDA MRN: [id number] DOB: 1941/01/19 Today's Date: 11/17/2017    History of Present Illness Debarah Mccumbers  is a 77 y.o. female with a known history of coronary artery disease, history of essential hypertension hyperlipidemia carotid artery stenosis and history of breast cancer given to the ED with the left upper extremity and lower extremity weakness.  She was found to be normal until 1 AM today she woke up around 6:00 with left-sided weakness and slurry speech.  Came into the ED CT head is negative seen by telemetry neurologist, not recommending TPA.  MRI of the brain was ordered and hospitalist team was called to admit the patient.  Denies any headache or blurry vision.  Denies any falls.   Clinical Impression   Patient is a 77 yo female who reports she woke up yesterday and had an episode of her left arm flailing around and could not control her movement in the arm.  Her husband called EMS and she was transported to [facility] for further evaluation.  Patient seen for OT evaluation this date and appears back to baseline.  Her strength is equal in BUE and within functional limits. Coordination for rapid alternating movements and finger to nose intact as well as oppositional movements of the thumb to each digit.  She is independent with basic self care tasks, could stand at the sink for grooming tasks, independent with bed mobility and transfers.  She does demonstrate some memory deficits however the patient and husband report this is her baseline and she has had difficulty with memory over the last couple of years. She does not have any OT needs at this time, please reconsult if needs arise.      Follow Up Recommendations  No OT follow up    Equipment Recommendations  None recommended by OT    Recommendations for Other Services       Precautions / Restrictions Precautions Precautions: None Restrictions Weight Bearing Restrictions:  No      Mobility Bed Mobility Overal bed mobility: Independent                Transfers Overall transfer level: Independent                    Balance Overall balance assessment: Independent                                         ADL either performed or assessed with clinical judgement   ADL Overall ADL's : Independent                                       General ADL Comments: Patient appears back to baseline, was able to demonstrate LB dressing, grooming in standing at the sink and independent with toilet and bed transfers     Vision Baseline Vision/History: Wears glasses Wears Glasses: Reading only Patient Visual Report: No change from baseline       Perception     Praxis      Pertinent Vitals/Pain Pain Assessment: No/denies pain     Hand Dominance Right   Extremity/Trunk Assessment Upper Extremity Assessment Upper Extremity Assessment: Overall WFL for tasks assessed   Lower Extremity Assessment Lower Extremity Assessment: Overall WFL for tasks assessed   Cervical /  Trunk Assessment Cervical / Trunk Assessment: Normal   Communication Communication Communication: No difficulties   Cognition Arousal/Alertness: Awake/alert Behavior During Therapy: WFL for tasks assessed/performed Overall Cognitive Status: History of cognitive impairments - at baseline                                 General Comments: Patient with memory impairments prior to event and appears at baseline   General Comments       Exercises     Shoulder Instructions      Home Living Family/patient expects to be discharged to:: Private residence Living Arrangements: Spouse/significant other Available Help at Discharge: Family Type of Home: House Home Access: Stairs to enter Technical brewer of Steps: 2   Home Layout: One level     Bathroom Shower/Tub: Corporate investment banker: Handicapped  height Bathroom Accessibility: Yes   Home Equipment: None          Prior Functioning/Environment Level of Independence: Independent        Comments: Patient does report she has had memory issues for the last couple of years.         OT Problem List: Decreased cognition      OT Treatment/Interventions:      OT Goals(Current goals can be found in the care plan section) Acute Rehab OT Goals Patient Stated Goal: to go home with husband, be independent OT Goal Formulation: With patient Time For Goal Achievement: 11/21/17 Potential to Achieve Goals: Good  OT Frequency:     Barriers to D/C:            Co-evaluation              AM-PAC PT "6 Clicks" Daily Activity     Outcome Measure Help from another person eating meals?: None Help from another person taking care of personal grooming?: None Help from another person toileting, which includes using toliet, bedpan, or urinal?: None Help from another person bathing (including washing, rinsing, drying)?: None Help from another person to put on and taking off regular upper body clothing?: None Help from another person to put on and taking off regular lower body clothing?: None 6 Click Score: 24   End of Session Equipment Utilized During Treatment: Gait belt  Activity Tolerance: Patient tolerated treatment well Patient left: in bed;with call bell/phone within reach;with bed alarm set  OT Visit Diagnosis: Muscle weakness (generalized) (M62.81)                Time: 9417-4081 OT Time Calculation (min): 43 min Charges:  OT General Charges $OT Visit: 1 Visit OT Evaluation $OT Eval Low Complexity: 1 Low G-Codes: OT G-codes **NOT FOR INPATIENT CLASS** Functional Assessment Tool Used: AM-PAC 6 Clicks Daily Activity   Amy T Lovett, OTR/L, CLT  Lovett,Amy 11/17/2017, 9:30 AM

## 2017-11-17 NOTE — Consult Note (Signed)
STROKE TEAM PROGRESS NOTE    HPI(from chart):  Kathleen Walls  is a 77 y.o. female with a known history of coronary artery disease, history of essential hypertension hyperlipidemia carotid artery stenosis and history of breast cancer given to the ED with the left upper extremity and lower extremity weakness.  She was found to be normal until 1 AM today she woke up around 6:00 with left-sided weakness and slurry speech.  Came into the ED CT head is negative seen by telemetry neurologist, not recommending TPA.  MRI of the brain was ordered and hospitalist team was called to admit the patient.  Patient seems to be anxious husband at bedside.  Denies any headache or blurry vision.  Denies any falls.   SUBJECTIVE (INTERVAL HISTORY) Her family is at bedside including her daughter-in-law and husband.  Patient had right MCA embolic infarcts in the setting of 75% internal carotid artery stenosis.  Discussed that embolus likely due to stenosis.  Also discussed elevated cholesterol LDL 187, she is intolerant to statins, she should see her physician for the new medication such as Praluent or Repatha.   OBJECTIVE Vitals:   11/16/17 2312 11/17/17 0248 11/17/17 0615 11/17/17 0900  BP: (!) 161/62 (!) 111/52 (!) 103/51 (!) 123/53  Pulse: 70 61 65 62  Resp: 17 18 17 16   Temp: 98.5 F (36.9 C) 98.2 F (36.8 C) 98.2 F (36.8 C) 98.2 F (36.8 C)  TempSrc: Oral Oral Oral Oral  SpO2:  97% 95% 97%  Weight: 92 lb 14.4 oz (42.1 kg)     Height: 5\' 3"  (1.6 m)       CBC:  Recent Labs  Lab 11/16/17 0806  WBC 7.0  NEUTROABS 4.4  HGB 14.2  HCT 44.2  MCV 80.3  PLT 767    Basic Metabolic Panel:  Recent Labs  Lab 11/16/17 0806  NA 142  K 4.5  CL 105  CO2 31  GLUCOSE 107*  BUN 19  CREATININE 0.90  CALCIUM 9.5    Lipid Panel:     Component Value Date/Time   CHOL 271 (H) 11/17/2017 0402   TRIG 135 11/17/2017 0402   HDL 57 11/17/2017 0402   CHOLHDL 4.8 11/17/2017 0402   VLDL 27 11/17/2017 0402    LDLCALC 187 (H) 11/17/2017 0402   HgbA1c:  Lab Results  Component Value Date   HGBA1C 5.9 (H) 11/17/2017   Urine Drug Screen:     Component Value Date/Time   LABOPIA NONE DETECTED 11/16/2017 0806   COCAINSCRNUR NONE DETECTED 11/16/2017 0806   LABBENZ NONE DETECTED 11/16/2017 0806   AMPHETMU NONE DETECTED 11/16/2017 0806   THCU NONE DETECTED 11/16/2017 0806   LABBARB NONE DETECTED 11/16/2017 0806    Alcohol Level     Component Value Date/Time   ETH <10 11/16/2017 0806    IMAGING  Ct Angio Head W Or Wo Contrast  Result Date: 11/16/2017 CLINICAL DATA:  Stroke EXAM: CT ANGIOGRAPHY HEAD AND NECK TECHNIQUE: Multidetector CT imaging of the head and neck was performed using the standard protocol during bolus administration of intravenous contrast. Multiplanar CT image reconstructions and MIPs were obtained to evaluate the vascular anatomy. Carotid stenosis measurements (when applicable) are obtained utilizing NASCET criteria, using the distal internal carotid diameter as the denominator. CONTRAST:  37mL OMNIPAQUE IOHEXOL 350 MG/ML SOLN COMPARISON:  MRI head 11/16/2017 FINDINGS: CTA NECK FINDINGS Aortic arch: Moderate atherosclerotic calcification in the aortic arch and proximal great vessels. Mild stenosis left subclavian artery. Remaining great vessels widely  patent. Right carotid system: Right common carotid artery patent. Calcified and noncalcified plaque at the right carotid bifurcation. 75% diameter stenosis proximal right internal carotid artery. 50% diameter stenosis proximal right external carotid artery. Left carotid system: Left common carotid artery widely patent. Calcified plaque at the left carotid bifurcation narrowing the lumen by 50% diameter stenosis. Vertebral arteries: Moderate to severe stenosis origin of right vertebral artery. Rest of the right vertebral artery widely patent to the basilar Mild stenosis origin of left vertebral artery. Left vertebral artery dominant without  additional stenosis. Skeleton: Cervical spondylosis.  No acute skeletal abnormality. Other neck: Negative for mass or adenopathy. Upper chest: Negative Review of the MIP images confirms the above findings CTA HEAD FINDINGS Anterior circulation: Atherosclerotic calcification in the cavernous carotid bilaterally without significant stenosis. Anterior and middle cerebral arteries patent without significant stenosis or large vessel occlusion Posterior circulation: Both vertebral arteries contribute to the basilar without stenosis. Basilar widely patent. PICA, superior cerebellar, and posterior cerebral arteries widely patent without stenosis. Venous sinuses: Patent Anatomic variants: None Delayed phase: Normal enhancement on delayed imaging Review of the MIP images confirms the above findings IMPRESSION: 75% diameter stenosis proximal right internal carotid artery due to atherosclerotic stenosis 50% diameter stenosis proximal left internal carotid artery due to atherosclerotic stenosis Moderate stenosis origin right vertebral artery and mild stenosis origin left vertebral artery No emergent large vessel intracranial occlusion. Electronically Signed   By: Franchot Gallo M.D.   On: 11/16/2017 14:23   Ct Angio Neck W Or Wo Contrast  Result Date: 11/16/2017 CLINICAL DATA:  Stroke EXAM: CT ANGIOGRAPHY HEAD AND NECK TECHNIQUE: Multidetector CT imaging of the head and neck was performed using the standard protocol during bolus administration of intravenous contrast. Multiplanar CT image reconstructions and MIPs were obtained to evaluate the vascular anatomy. Carotid stenosis measurements (when applicable) are obtained utilizing NASCET criteria, using the distal internal carotid diameter as the denominator. CONTRAST:  51mL OMNIPAQUE IOHEXOL 350 MG/ML SOLN COMPARISON:  MRI head 11/16/2017 FINDINGS: CTA NECK FINDINGS Aortic arch: Moderate atherosclerotic calcification in the aortic arch and proximal great vessels. Mild  stenosis left subclavian artery. Remaining great vessels widely patent. Right carotid system: Right common carotid artery patent. Calcified and noncalcified plaque at the right carotid bifurcation. 75% diameter stenosis proximal right internal carotid artery. 50% diameter stenosis proximal right external carotid artery. Left carotid system: Left common carotid artery widely patent. Calcified plaque at the left carotid bifurcation narrowing the lumen by 50% diameter stenosis. Vertebral arteries: Moderate to severe stenosis origin of right vertebral artery. Rest of the right vertebral artery widely patent to the basilar Mild stenosis origin of left vertebral artery. Left vertebral artery dominant without additional stenosis. Skeleton: Cervical spondylosis.  No acute skeletal abnormality. Other neck: Negative for mass or adenopathy. Upper chest: Negative Review of the MIP images confirms the above findings CTA HEAD FINDINGS Anterior circulation: Atherosclerotic calcification in the cavernous carotid bilaterally without significant stenosis. Anterior and middle cerebral arteries patent without significant stenosis or large vessel occlusion Posterior circulation: Both vertebral arteries contribute to the basilar without stenosis. Basilar widely patent. PICA, superior cerebellar, and posterior cerebral arteries widely patent without stenosis. Venous sinuses: Patent Anatomic variants: None Delayed phase: Normal enhancement on delayed imaging Review of the MIP images confirms the above findings IMPRESSION: 75% diameter stenosis proximal right internal carotid artery due to atherosclerotic stenosis 50% diameter stenosis proximal left internal carotid artery due to atherosclerotic stenosis Moderate stenosis origin right vertebral artery and mild stenosis origin  left vertebral artery No emergent large vessel intracranial occlusion. Electronically Signed   By: Franchot Gallo M.D.   On: 11/16/2017 14:23   Mr Brain Wo Contrast  (neuro Protocol)  Result Date: 11/16/2017 CLINICAL DATA:  77 year old female. Code stroke. Abnormal left upper extremity sensation this morning. EXAM: MRI HEAD WITHOUT CONTRAST TECHNIQUE: Multiplanar, multiecho pulse sequences of the brain and surrounding structures were obtained without intravenous contrast. COMPARISON:  Head CT without contrast 0741 hours. Brain MRI 12/31/2013. FINDINGS: Brain: There are scattered small cortical and subcortical white matter foci of restricted diffusion in the right frontal lobe middle and superior frontal gyri as well as the anterior superior parietal lobe. Peri rolandic cortex in the region of left upper extremity representation is involved on series 100, image 46. There is minimal associated T2 and FLAIR hyperintensity. No hemorrhage or mass effect. No contralateral left hemisphere, posterior circulation, or posterior fossa restricted diffusion. Mild patchy T2 hyperintensity in the left corona radiata most resembles of a chronic lacunar infarct. No cortical encephalomalacia or chronic cerebral blood products identified. Negative bilateral deep gray matter nuclei, brainstem, and cerebellum. No midline shift, mass effect, evidence of mass lesion, ventriculomegaly, extra-axial collection or acute intracranial hemorrhage. Cervicomedullary junction and pituitary are within normal limits. Vascular: Major intracranial vascular flow voids are preserved with generalized intracranial artery ectasia. The distal left vertebral artery is dominant. However, there is asymmetric increased STIR signal within a posterior sylvian right MCA branch on series 8, image 29 which is suspicious for slow flow or occlusion. Skull and upper cervical spine: Negative visible cervical spine. Normal bone marrow signal. Sinuses/Orbits: Postoperative changes to both globes. Paranasal sinuses and mastoids are stable and well pneumatized. Other: Visible internal auditory structures appear normal. Scalp and face  soft tissues appear negative. IMPRESSION: 1. Positive for scattered small acute infarcts in the Right MCA territory, including the superior right peri rolandic cortex in the area of left upper extremity representation. No associated hemorrhage or mass effect. 2. Possible associated slow flow or occlusion of a posterior right MCA distal M2 or M3 branch. MRA or CTA could evaluate further. 3. Underlying small chronic lacunar infarct of the left corona radiata, but no other chronic ischemic changes identified in the brain. Electronically Signed   By: Genevie Ann M.D.   On: 11/16/2017 11:07   US Carotid Bilateral (at [facility] And Ap Only)  Result Date: 11/17/2017 CLINICAL DATA:  77 year old female with symptoms of transient ischemic attack and bilateral ICA stenosis as identified on recent neck CTA. EXAM: BILATERAL CAROTID DUPLEX ULTRASOUND TECHNIQUE: Pearline Cables scale imaging, color Doppler and duplex ultrasound were performed of bilateral carotid and vertebral arteries in the neck. COMPARISON:  CTA neck 11/16/2017 FINDINGS: Criteria: Quantification of carotid stenosis is based on velocity parameters that correlate the residual internal carotid diameter with NASCET-based stenosis levels, using the diameter of the distal internal carotid lumen as the denominator for stenosis measurement. The following velocity measurements were obtained: RIGHT ICA:  229/44 cm/sec CCA:  631/49 cm/sec SYSTOLIC ICA/CCA RATIO:  2.1 DIASTOLIC ICA/CCA RATIO:  2.5 ECA:  201 cm/sec LEFT ICA:  155/29 cm/sec CCA:  702/63 cm/sec SYSTOLIC ICA/CCA RATIO:  1.2 DIASTOLIC ICA/CCA RATIO:  1.8 ECA:  188 cm/sec RIGHT CAROTID ARTERY: Heterogeneous and bulky calcified atherosclerotic plaque in the proximal internal carotid artery. By peak systolic velocity criteria, the estimated stenosis is greater than 70%. There is evidence of spectral broadening, high velocity jetting and aliasing. RIGHT VERTEBRAL ARTERY:  Patent with normal antegrade flow. LEFT CAROTID  ARTERY:  Focal heterogeneous calcified plaque in the proximal internal carotid artery. By peak systolic velocity criteria, the estimated stenosis falls in the 50-69% diameter range. LEFT VERTEBRAL ARTERY:  Patent with normal antegrade flow. IMPRESSION: 1. Severe (70-99%) stenosis proximal right internal carotid artery secondary to bulky, heterogeneous, irregular and partially calcified atherosclerotic plaque. 2. Moderate (50-69%) stenosis proximal left internal carotid artery secondary to heterogenous atherosclerotic plaque. 3. Vertebral arteries are patent with normal antegrade flow. 4. Sonographic findings are concordant with recent CTA neck. Signed, Criselda Peaches, MD [facility] Electronically Signed   By: Jacqulynn Cadet M.D.   On: 11/17/2017 08:38   Ct Head Code Stroke Wo Contrast  Addendum Date: 11/16/2017   ADDENDUM REPORT: 11/16/2017 08:21 ADDENDUM: Study discussed by telephone with Dr. Darel Hong on 11/16/2017 at 0803 hours. Electronically Signed   By: Genevie Ann M.D.   On: 11/16/2017 08:21   Result Date: 11/16/2017 CLINICAL DATA:  Code stroke. 77 year old female with abnormal left upper extremity sensation this morning. EXAM: CT HEAD WITHOUT CONTRAST TECHNIQUE: Contiguous axial images were obtained from the base of the skull through the vertex without intravenous contrast. COMPARISON:  Head CT without contrast 01/19/2014. FINDINGS: Brain: Cerebral volume remains normal for age. No midline shift, ventriculomegaly, mass effect, evidence of mass lesion, intracranial hemorrhage or evidence of cortically based acute infarction. Gray-white matter differentiation is within normal limits throughout the brain. Punctate dystrophic or atherosclerotic calcification along the right middle frontal gyrus (sagittal image 12). Vascular: Calcified atherosclerosis at the skull base. No suspicious intracranial vascular hyperdensity. Skull: Stable, negative. Sinuses  : Remain clear. Other: Stable and negative visible orbit and scalp soft tissues. ASPECTS ([place] Stroke Program Early CT Score) - Ganglionic level infarction (caudate, lentiform nuclei, internal capsule, insula, M1-M3 cortex): 7 - Supraganglionic infarction (M4-M6 cortex): 3 Total score (0-10 with 10 being normal): 10 IMPRESSION: 1. Stable and normal for age noncontrast CT appearance of the brain. 2. ASPECTS is 10. Electronically Signed: By: Genevie Ann M.D. On: 11/16/2017 07:56       PHYSICAL EXAM Physical exam: Exam: Gen: NAD, conversant, well nourised, thin                  CV: RRR, no MRG. No Carotid Bruits. No peripheral edema, warm, nontender Eyes: Conjunctivae clear without exudates or hemorrhage  Neuro: Detailed Neurologic Exam  Speech:    Speech is normal; fluent and spontaneous with normal comprehension.  Cognition:    The patient is oriented to person, place, and time;     recent and remote memory intact;     language fluent;     normal attention, concentration,     fund of knowledge Cranial Nerves:    The pupils are equal, round, and reactive to light.  Visual fields are full to finger confrontation. Extraocular movements are intact. Trigeminal sensation is intact and the muscles of mastication are normal. The face is symmetric. The palate elevates in the midline. Hearing intact. Voice is normal. Shoulder shrug is normal. The tongue has normal motion without fasciculations.   Coordination:    Normal finger to nose   Motor Observation:    No asymmetry, no atrophy, and no involuntary movements noted. Tone:    Normal muscle tone.    Posture:    Posture is normal. normal erect    Strength:    Strength is V/V in the upper and lower limbs.     Sensation: intact to LT  Reflex Exam:  DTR's:    Deep tendon reflexes in the upper and lower extremities are symmetrical bilaterally.   Toes:    The toes are equiv bilaterally.   Clonus:    Clonus is  absent.    ASSESSMENT/PLAN Ms. Kathleen Walls is a 77 y.o. female with history of breast cancer 2009, coronary artery disease, hyperlipidemia, hypertension, migraine, carotid artery stenosis.  Patient presented to the emergency room with left upper and lower extremity weakness.  TPA was not administered due to being out of the time window.MRI revealed a acute right MCA distribution infarcts along with possible right M2 M3 occlusion.  Carotid studies reveal evidence of occlusion, 75% right ICA.  Currently on aspirin and Plavix.    Stroke:  right acute infarcts embolic, secondary to carotid artery stenosis.  Old left lacunar infarct likely due to small vessel disease and atherosclerosis.  Hyperlipidemia  LDL 187, goal < 70  She is intolerant to statins.  She should see her outpatient physician for [place] or Praluent, had a discussion with her her cholesterol management is essential and management of atherosclerosis.  Continue statin at discharge  Diabetes type II  HgbA1c 5.9, goal < 7.0  Other Stroke Risk Factors  Advanced age  Coronary artery disease  Migraines   Plan  Consulted vascular surgery today for evaluation of stenting or carotid endarterectomy  Patient needs to manage her hyperlipidemia outpatient she is intolerant to statins may consider the newer medication such as Repatha or Praluent needs to follow-up outpatient with her physician for this  Stroke will sign off at this time.  Vascular surgery states they will see her today likely outpatient procedure  Discharged on aspirin and Plavix and follow with vascular surgery.  Hospital day # 1  Personally examined patient and images, and have participated in and made any corrections needed to history, physical, neuro exam,assessment and plan as stated above.  I have personally obtained the history, evaluated lab date, reviewed imaging studies and agree with radiology interpretations.   I had a long d/w patient about  her recent stroke, risk for recurrent stroke/TIAs, personally independently reviewed imaging studies and stroke evaluation results and answered questions.Continue ASA nad Plavix for secondary stroke prevention and maintain strict control of hypertension with blood pressure goal below 130/90, diabetes with hemoglobin A1c goal below 6.5% and lipids with LDL cholesterol goal below 70 mg/dL.Patient has h/o statin intolerance hence recommend new PCSK9 inhibitor like Praluent. Recommend she see her primary MD to get prescription. I also advised the patient to eat a healthy diet with plenty of whole grains, cereals, fruits and vegetables, exercise regularly and maintain ideal body weight .Followup in the future with outpatient neurology in 3 months or call earlier.   Sarina Ill, MD Stroke Neurology    To contact Stroke Continuity provider, please refer to [email or web address] After hours, contact General Neurology

## 2017-11-18 LAB — ECHOCARDIOGRAM COMPLETE
HEIGHTINCHES: 63 in
WEIGHTICAEL: 1486.4 [oz_av]

## 2017-11-19 ENCOUNTER — Encounter: Payer: Self-pay | Admitting: Internal Medicine

## 2017-11-19 ENCOUNTER — Inpatient Hospital Stay: Payer: Medicare Other | Attending: Internal Medicine | Admitting: Internal Medicine

## 2017-11-19 VITALS — BP 121/63 | HR 61 | Temp 97.4°F | Resp 16 | Wt 95.3 lb

## 2017-11-19 DIAGNOSIS — C50812 Malignant neoplasm of overlapping sites of left female breast: Secondary | ICD-10-CM

## 2017-11-19 DIAGNOSIS — Z853 Personal history of malignant neoplasm of breast: Secondary | ICD-10-CM

## 2017-11-19 DIAGNOSIS — Z170 Estrogen receptor positive status [ER+]: Secondary | ICD-10-CM

## 2017-11-19 NOTE — Assessment & Plan Note (Addendum)
#  Breast cancer- stage II ER/PR positive HER-2/neu negative high risk Oncotype status post chemotherapy [2009]; adjuvant hormone therapy until 2012.   Clinically, based on history/review of system and physical findings; no evidence of any recurrence noted.  Mammogram 2019 Jan negative- reviewed the mammogram with the patient.   #'Acute stroke-[[facility] 2019]-currently symptoms resolved.  # Recommend follow-up in one year; mammogram will be ordered through her PCP [Dr.Sparks]. Prefers to keep follow up with [place].   # 12 months/cbc/cmp/labs

## 2017-11-19 NOTE — Progress Notes (Signed)
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  Patient was recently discharged from the hospital after being diagnosed with acute stroke involving the left upper extremity.  Her symptoms are currently resolved.  She is currently on Plavix.   Patient denies new lumps or bumps. Patient denies any unusual shortness of breath or cough or unusual bone pain. Her appetite is fair. No unusual weight loss. No nausea no vomiting. Chronic tingling and numbness in the feet; This is not any worse.   REVIEW OF SYSTEMS:  A complete 10 point review of system is done which is negative except mentioned above/history of present illness.   PAST MEDICAL HISTORY :  Past Medical History:  Diagnosis Date  . Arthritis   . Breast cancer [facility]) 2009    2009 breast cancer stage IIA T2 N0, ER positive, HER-2 negative, 2.5 cm primary tumor, poorly differentiated, status post wide local excision, one sentinel lymph node negative, high-risk Oncotype score 48  . Cataracts, both eyes   . Colitis   . Coronary artery disease   . Detached vitreous humor   . Diverticulitis   . 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 Gastritis   . GERD (gastroesophageal reflux disease)   . 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 Hemorrhoid   . Hemorrhoids   . Hyperlipemia   .  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 Kidney cysts    Left  . Kidney stones   . Lipoma of shoulder 2009   left  . Migraine   . Osteoarthritis   . Osteopenia   . Stented coronary artery     PAST SURGICAL HISTORY :   Past Surgical History:  Procedure Laterality Date  . ABDOMINAL HYSTERECTOMY    . benign breast biopsy  2007  . BREAST BIOPSY Left 2007   positive  . BREAST CYST ASPIRATION Right    neg  . BREAST LUMPECTOMY Left 2009    2009 breast cancer stage IIA T2 N0, ER positive, HER-2 negative, 2.5 cm primary tumor, poorly differentiated, status post wide local excision, one sentinel lymph node negative, high-risk Oncotype score 48  . BREAST SURGERY    . CARDIAC CATHETERIZATION N/A 06/22/2015   Procedure: Left Heart Cath and Coronary Angiography;  Surgeon: Teodoro Spray, MD;  Location: [facility];  Service: Cardiovascular;  Laterality: N/A;  . CARDIAC CATHETERIZATION    . CATARACT EXTRACTION    . COLONOSCOPY    . CORONARY ANGIOPLASTY    . EYE SURGERY    . FOOT FRACTURE SURGERY Left   . PARTIAL HYSTERECTOMY    . UPPER GASTROINTESTINAL ENDOSCOPY      FAMILY HISTORY :   Family History  Problem Relation Age of Onset  . Heart disease Mother   . Diabetes Father   . Heart disease Father   . Diabetes Sister   . Diabetes Brother   . Cervical cancer Cousin   . Breast cancer Cousin   . Lung cancer Paternal Aunt   . Prostate cancer Maternal Uncle   . Leukemia Unknown   . Hypertension Unknown     SOCIAL HISTORY:   Social History   Tobacco Use  . Smoking status: Former Smoker    Types: Cigarettes    Last attempt to quit: 08/14/1993    Years since quitting: 24.2  . Smokeless tobacco: Never Used  Substance Use Topics  . Alcohol use: No    Alcohol/week: 0.0 oz  . Drug use: No    ALLERGIES:  is allergic to alendronate; alendronate sodium; atorvastatin; azithromycin; ciprofloxacin; colestipol; lidocaine;  lovastatin; metronidazole; niacin; risedronate; risedronate sodium; rosuvastatin; and simvastatin.  MEDICATIONS:  Current Outpatient Medications  Medication Sig Dispense Refill  . ALPRAZolam (XANAX) 0.25 MG tablet TAKE ONE-HALF TABLET BY MOUTH AT BEDTIME    . aspirin EC 81 MG tablet Take 81 mg by mouth daily.     . Azelaic Acid 15 % cream Apply 1 application topically daily. Reported on 11/18/2015    . butalbital-aspirin-caffeine (FIORINAL) 50-325-40 MG per capsule Take 1 capsule by mouth every 4 (four) hours as needed for headache.     . calcium carbonate (TUMS - DOSED IN MG ELEMENTAL CALCIUM) 500 MG chewable tablet Chew 2 tablets by mouth daily.     . Cholecalciferol (VITAMIN D-1000 MAX ST) 1000 UNITS tablet Take 1,000 Units by mouth daily.     . clopidogrel (PLAVIX) 75 MG tablet Take 1 tablet (75 mg total) by mouth daily. 30 tablet 2  . docusate sodium (STOOL SOFTENER) 100 MG capsule Take 100 mg by mouth daily as needed for mild constipation.     Marland Kitchen donepezil (ARICEPT) 5 MG tablet Take 5 mg by mouth at bedtime.     Marland Kitchen ezetimibe (ZETIA) 10 MG tablet Take 10 mg by mouth daily.     Marland Kitchen gabapentin (NEURONTIN) 300 MG capsule TAKE ONE CAPSULE BY MOUTH THREE TIMES DAILY    . metoprolol succinate (TOPROL-XL) 25 MG 24 hr tablet Take 25 mg by mouth daily.     . nitroGLYCERIN (NITROSTAT) 0.4 MG SL tablet Place 0.4 mg under the tongue every 5 (five) minutes as needed for chest pain.    Marland Kitchen omeprazole (PRILOSEC) 20 MG capsule Take 20 mg by mouth 2 (two) times daily before a meal.    . sodium chloride (MURO 128) 5 % ophthalmic solution Place 1 drop into the left eye as needed for eye irritation.     No current facility-administered medications for this visit.     PHYSICAL EXAMINATION: ECOG PERFORMANCE STATUS: 0 - Asymptomatic  BP 121/63 (BP Location: Right Arm, Patient Position: Sitting)   Pulse 61   Temp (!) 97.4 F (36.3 C) (Tympanic)   Resp 16   Wt 95 lb 4.8 oz (43.2 kg)   BMI 16.88 kg/m   Filed  Weights   11/19/17 1415  Weight: 95 lb 4.8 oz (43.2 kg)    GENERAL: Well-nourished well-developed; Alert, no distress and comfortable.  Thin built. She is accompanied by her husband. EYES: no pallor or icterus OROPHARYNX: no thrush or ulceration; good dentition  NECK: supple, no masses felt LYMPH:  no palpable lymphadenopathy in the cervical, axillary or inguinal regions LUNGS: clear to auscultation  and  No wheeze or crackles HEART/CVS: regular rate & rhythm and no murmurs; No lower extremity edema ABDOMEN:abdomen soft, non-tender and normal bowel sounds Musculoskeletal:no cyanosis of digits and no clubbing  PSYCH: alert & oriented x 3 with fluent speech NEURO: no focal motor/sensory deficits SKIN:  no rashes or significant lesions Breast exam [in presence of husband]- right breast- no skin changes no nipple changes no masses felt. Left breast- lumpectomy scar noted otherwise no masses felt.  LABORATORY DATA:  I have reviewed the data as listed    Component Value Date/Time   NA 142 11/16/2017 0806   NA 143 12/31/2013 0406   K 4.5 11/16/2017 0806   K 4.5 12/31/2013 0406   CL 105 11/16/2017 0806   CL 111 (H) 12/31/2013 0406   CO2 31 11/16/2017 0806   CO2 28 12/31/2013 0406   GLUCOSE 107 (H) 11/16/2017 0806   GLUCOSE 115 (H) 12/31/2013 0406   BUN 19 11/16/2017 0806   BUN 10 12/31/2013 0406   CREATININE 0.90 11/16/2017 0806   CREATININE 0.87 12/31/2013 0406   CALCIUM 9.5 11/16/2017 0806   CALCIUM 8.6 12/31/2013 0406   PROT 7.3 11/16/2017 0806   ALBUMIN 4.0 11/16/2017 0806   AST 30 11/16/2017 0806   ALT 28 11/16/2017 0806   ALKPHOS 76 11/16/2017 0806   BILITOT 0.5 11/16/2017 0806   GFRNONAA >60 11/16/2017 0806   GFRNONAA >60 12/31/2013 0406   GFRAA >60 11/16/2017 0806   GFRAA >60 12/31/2013 0406    No results found for: SPEP, UPEP  Lab Results  Component Value Date   WBC 7.0 11/16/2017   NEUTROABS 4.4 11/16/2017   HGB 14.2 11/16/2017   HCT 44.2 11/16/2017   MCV  80.3 11/16/2017   PLT 237 11/16/2017      Chemistry      Component Value Date/Time   NA 142 11/16/2017 0806   NA 143 12/31/2013 0406   K 4.5 11/16/2017 0806   K 4.5 12/31/2013 0406   CL 105 11/16/2017 0806   CL 111 (H) 12/31/2013 0406   CO2 31 11/16/2017 0806   CO2 28 12/31/2013 0406   BUN 19 11/16/2017 0806   BUN 10 12/31/2013 0406   CREATININE 0.90 11/16/2017 0806   CREATININE 0.87 12/31/2013 0406      Component Value Date/Time   CALCIUM 9.5 11/16/2017 0806   CALCIUM 8.6 12/31/2013 0406   ALKPHOS 76 11/16/2017 0806   AST 30 11/16/2017 0806   ALT 28 11/16/2017 0806   BILITOT 0.5 11/16/2017 0806     ASSESSMENT & PLAN:   Carcinoma of overlapping sites of left breast in female, estrogen receptor positive ([place]) # Breast cancer- stage II ER/PR positive HER-2/neu negative high risk Oncotype status post chemotherapy [2009]; adjuvant hormone therapy until 2012.   Clinically, based on history/review of system and physical findings; no evidence of any recurrence noted.  Mammogram 2019 Jan negative- reviewed the mammogram with the patient.   #'Acute stroke-[[facility] 2019]-currently symptoms resolved.  # Recommend follow-up in one year; mammogram will be ordered through her PCP [Dr.Sparks]. Prefers to keep follow up with [place].   # 12 months/cbc/cmp/labs      Cammie Sickle, MD 11/20/2017 7:54 AM

## 2017-11-22 ENCOUNTER — Ambulatory Visit (INDEPENDENT_AMBULATORY_CARE_PROVIDER_SITE_OTHER): Payer: Medicare Other | Admitting: Vascular Surgery

## 2017-11-22 ENCOUNTER — Encounter (INDEPENDENT_AMBULATORY_CARE_PROVIDER_SITE_OTHER): Payer: Self-pay | Admitting: Vascular Surgery

## 2017-11-22 VITALS — BP 110/57 | HR 55 | Resp 16 | Ht 63.0 in | Wt 94.0 lb

## 2017-11-22 DIAGNOSIS — G459 Transient cerebral ischemic attack, unspecified: Secondary | ICD-10-CM | POA: Diagnosis not present

## 2017-11-22 DIAGNOSIS — I6523 Occlusion and stenosis of bilateral carotid arteries: Secondary | ICD-10-CM | POA: Diagnosis not present

## 2017-11-22 DIAGNOSIS — E782 Mixed hyperlipidemia: Secondary | ICD-10-CM | POA: Diagnosis not present

## 2017-11-22 DIAGNOSIS — I10 Essential (primary) hypertension: Secondary | ICD-10-CM | POA: Diagnosis not present

## 2017-11-25 ENCOUNTER — Encounter (INDEPENDENT_AMBULATORY_CARE_PROVIDER_SITE_OTHER): Payer: Self-pay | Admitting: Vascular Surgery

## 2017-11-25 NOTE — Progress Notes (Signed)
MRN : [id number]  Kathleen Walls is a 77 y.o. (07/17/1941) female who presents with chief complaint of  Chief Complaint  Patient presents with  . Follow-up    ct results  .  History of Present Illness: The patient is seen for follow up evaluation of carotid stenosis status post CT angiogram. CT scan was done 11/16/2017.   The patient endorses a history of waking up on friday morning unable to get out of bed noting that her left upper extremity was weak.  The patient called EMS and was evaluated at [facility] ED.  Prior to this occurrence, the patient denies any amaurosis fugax or neurological issues.  The patient denies interval amaurosis fugax. There is no recent or interval TIA symptoms or focal motor deficits. There is no prior documented CVA.  The patient is taking enteric-coated aspirin 81 mg daily and Plavix 75 mg daily.  There is no history of migraine headaches. There is no history of seizures.  The patient has a history of coronary artery disease, no recent episodes of angina or shortness of breath. The patient denies PAD or claudication symptoms. There is a history of hyperlipidemia which is being treated with a statin.    CT angiogram is reviewed by me personally and shows 75-80% stenosis consistent with calcified plaque at the origin of the right  internal carotid artery.  There is 61% LICA stenosis  Current Meds  Medication Sig  . ALPRAZolam (XANAX) 0.25 MG tablet TAKE ONE-HALF TABLET BY MOUTH AT BEDTIME  . aspirin EC 81 MG tablet Take 81 mg by mouth daily.   . Azelaic Acid 15 % cream Apply 1 application topically daily. Reported on 11/18/2015  . Butalbital-APAP-Caffeine (FIORICET PO) 40 mg as needed.  . calcium carbonate (TUMS - DOSED IN MG ELEMENTAL CALCIUM) 500 MG chewable tablet Chew 2 tablets by mouth daily.   . Cholecalciferol (VITAMIN D-1000 MAX ST) 1000 UNITS tablet Take 1,000 Units by mouth daily.   . clopidogrel (PLAVIX) 75 MG tablet Take  1 tablet (75 mg total) by mouth daily.  Marland Kitchen docusate sodium (STOOL SOFTENER) 100 MG capsule Take 100 mg by mouth daily as needed for mild constipation.   Marland Kitchen donepezil (ARICEPT) 5 MG tablet Take 5 mg by mouth at bedtime.   Marland Kitchen ezetimibe (ZETIA) 10 MG tablet Take 10 mg by mouth daily.   Marland Kitchen gabapentin (NEURONTIN) 300 MG capsule TAKE ONE CAPSULE BY MOUTH THREE TIMES DAILY  . metoprolol succinate (TOPROL-XL) 25 MG 24 hr tablet Take 25 mg by mouth daily.   . nitroGLYCERIN (NITROSTAT) 0.4 MG SL tablet Place 0.4 mg under the tongue every 5 (five) minutes as needed for chest pain.  Marland Kitchen omeprazole (PRILOSEC) 20 MG capsule Take 20 mg by mouth 2 (two) times daily before a meal.  . sodium chloride (MURO 128) 5 % ophthalmic solution Place 1 drop into the left eye as needed for eye irritation.    Past Medical History:  Diagnosis Date  . Arthritis   . Breast cancer [facility]) 2009    2009 breast cancer stage IIA T2 N0, ER positive, HER-2 negative, 2.5 cm primary tumor, poorly differentiated, status post wide local excision, one sentinel lymph node negative, high-risk Oncotype score 48  . Cataracts, both eyes   . Colitis   . Coronary artery disease   . Detached vitreous humor   . Diverticulitis   . 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 Gastritis   . GERD (gastroesophageal reflux disease)   .  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 Hemorrhoid   . Hemorrhoids   . Hyperlipemia   . 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 Kidney cysts    Left  . Kidney stones   . Lipoma of shoulder 2009   left  . Migraine   . Osteoarthritis   . Osteopenia   . Stented coronary artery     Past Surgical History:  Procedure Laterality Date  . ABDOMINAL HYSTERECTOMY    . benign breast biopsy  2007  . BREAST BIOPSY Left 2007   positive  . BREAST CYST ASPIRATION Right    neg  . BREAST LUMPECTOMY Left 2009    2009 breast cancer stage IIA T2 N0, ER positive, HER-2 negative, 2.5 cm primary  tumor, poorly differentiated, status post wide local excision, one sentinel lymph node negative, high-risk Oncotype score 48  . BREAST SURGERY    . CARDIAC CATHETERIZATION N/A 06/22/2015   Procedure: Left Heart Cath and Coronary Angiography;  Surgeon: Teodoro Spray, MD;  Location: [place] [facility];  Service: Cardiovascular;  Laterality: N/A;  . CARDIAC CATHETERIZATION    . CATARACT EXTRACTION    . COLONOSCOPY    . CORONARY ANGIOPLASTY    . EYE SURGERY    . FOOT FRACTURE SURGERY Left   . PARTIAL HYSTERECTOMY    . UPPER GASTROINTESTINAL ENDOSCOPY      Social History Social History   Tobacco Use  . Smoking status: Former Smoker    Types: Cigarettes    Last attempt to quit: 08/14/1993    Years since quitting: 24.2  . Smokeless tobacco: Never Used  Substance Use Topics  . Alcohol use: No    Alcohol/week: 0.0 oz  . Drug use: No    Family History Family History  Problem Relation Age of Onset  . Heart disease Mother   . Diabetes Father   . Heart disease Father   . Diabetes Sister   . Diabetes Brother   . Cervical cancer Cousin   . Breast cancer Cousin   . Lung cancer Paternal Aunt   . Prostate cancer Maternal Uncle   . Leukemia Unknown   . Hypertension Unknown     Allergies  Allergen Reactions  . Alendronate     Other reaction(s): Unknown  . Alendronate Sodium     Other reaction(s): Unknown  . Atorvastatin     Other reaction(s): Muscle Pain Other reaction(s): Muscle Pain  . Azithromycin Hives  . Ciprofloxacin     Other reaction(s): Headache  . Colestipol     Other reaction(s): Unknown  . Lidocaine Hives  . Lovastatin     Other reaction(s): Headache Other reaction(s): Headache  . Metronidazole     Other reaction(s): Headache  . Niacin     Other reaction(s): Unknown  . Risedronate     Other reaction(s): Unknown  . Risedronate Sodium Other (See Comments)  . Rosuvastatin     Other reaction(s): Unknown Other reaction(s): Unknown  . Simvastatin     Other  reaction(s): Headache Other reaction(s): Headache     REVIEW OF SYSTEMS (Negative unless checked)  Constitutional: _0 Weight loss  _1 Fever  _2 Chills Cardiac: _3 Chest pain   _4 Chest pressure   _5 Palpitations   _6 Shortness of breath when laying flat   _7 Shortness of breath with exertion. Vascular:  _8 Pain in legs with walking   _9 Pain in legs at rest  _10 History of DVT   _11 Phlebitis   _12   Swelling in legs   _0 Varicose veins   _1 Non-healing ulcers Pulmonary:   _2 Uses home oxygen   _3 Productive cough   _4 Hemoptysis   _5 Wheeze  _6 COPD   _7 Asthma Neurologic:  _8 Dizziness   _9 Seizures   _10 History of stroke   _11 History of TIA  _12 Aphasia   _13 Vissual changes   _14 Weakness or numbness in arm   _15 Weakness or numbness in leg Musculoskeletal:   _16 Joint swelling   _17 Joint pain   _18 Low back pain Hematologic:  _19 Easy bruising  _20 Easy bleeding   _21 Hypercoagulable state   _22 Anemic Gastrointestinal:  _23 Diarrhea   _24 Vomiting  _25 Gastroesophageal reflux/heartburn   _26 Difficulty swallowing. Genitourinary:  _27 Chronic kidney disease   _28 Difficult urination  _29 Frequent urination   _30 Blood in urine Skin:  _31 Rashes   _32 Ulcers  Psychological:  _33 History of anxiety   _34  History of major depression.  Physical Examination  Vitals:   11/22/17 1009  BP: (!) 110/57  Pulse: (!) 55  Resp: 16  Weight: 94 lb (42.6 kg)  Height: _35  (1.6 m)   Body mass index is 16.65 kg/m. Gen: WD/WN, NAD Head: [place]/AT, No temporalis wasting.  Ear/Nose/Throat: Hearing grossly intact, nares w/o erythema or drainage Eyes: PER, EOMI, sclera nonicteric.  Neck: Supple, no large masses.   Pulmonary:  Good air movement, no audible wheezing bilaterally, no use of accessory muscles.  Cardiac: RRR, no JVD Vascular: bilateral carotid bruit Vessel Right Left  Radial Palpable Palpable  Ulnar Palpable Palpable  Brachial Palpable Palpable  Carotid Palpable Palpable  Gastrointestinal: Non-distended. No guarding/no peritoneal signs.    Musculoskeletal: M/S 5/5 throughout.  No deformity or atrophy.  Neurologic: CN 2-12 intact. Symmetrical.  Speech is fluent. Motor exam as listed above. Psychiatric: Judgment intact, Mood & affect appropriate for pt's clinical situation. Dermatologic: No rashes or ulcers noted.  No changes consistent with cellulitis. Lymph : No lichenification or skin changes of chronic lymphedema.  CBC Lab Results  Component Value Date   WBC 7.0 11/16/2017   HGB 14.2 11/16/2017   HCT 44.2 11/16/2017   MCV 80.3 11/16/2017   PLT 237 11/16/2017    BMET    Component Value Date/Time   NA 142 11/16/2017 0806   NA 143 12/31/2013 0406   K 4.5 11/16/2017 0806   K 4.5 12/31/2013 0406   CL 105 11/16/2017 0806   CL 111 (H) 12/31/2013 0406   CO2 31 11/16/2017 0806   CO2 28 12/31/2013 0406   GLUCOSE 107 (H) 11/16/2017 0806   GLUCOSE 115 (H) 12/31/2013 0406   BUN 19 11/16/2017 0806   BUN 10 12/31/2013 0406   CREATININE 0.90 11/16/2017 0806   CREATININE 0.87 12/31/2013 0406   CALCIUM 9.5 11/16/2017 0806   CALCIUM 8.6 12/31/2013 0406   GFRNONAA >60 11/16/2017 0806   GFRNONAA >60 12/31/2013 0406   GFRAA >60 11/16/2017 0806   GFRAA >60 12/31/2013 0406   Estimated Creatinine Clearance: 35.8 mL/min (by C-G formula based on SCr of 0.9 mg/dL).  COAG Lab Results  Component Value Date   INR 0.90 11/16/2017    Radiology Ct Angio Head W Or Wo Contrast  Result Date: 11/16/2017 CLINICAL DATA:  Stroke EXAM: CT ANGIOGRAPHY HEAD AND NECK TECHNIQUE: Multidetector CT imaging of the head and neck was performed using the standard protocol during bolus administration of intravenous contrast. Multiplanar CT image reconstructions and MIPs were obtained to evaluate the vascular anatomy. Carotid stenosis measurements (when applicable) are obtained utilizing NASCET criteria, using the distal internal carotid diameter as the denominator. CONTRAST:  4m OMNIPAQUE IOHEXOL 350 MG/ML SOLN COMPARISON:  MRI head 11/16/2017  FINDINGS: CTA NECK FINDINGS Aortic arch: Moderate atherosclerotic calcification in the aortic arch and proximal great vessels. Mild stenosis left subclavian artery. Remaining great vessels widely patent. Right carotid system: Right common carotid artery patent. Calcified and noncalcified plaque at the right carotid bifurcation. 75% diameter stenosis proximal right internal carotid artery. 50% diameter stenosis proximal right external carotid artery. Left carotid system: Left common carotid artery widely patent. Calcified plaque at the left carotid bifurcation narrowing the lumen by 50% diameter stenosis. Vertebral arteries: Moderate to severe stenosis origin of right vertebral artery. Rest of the right vertebral artery widely patent to the basilar Mild stenosis origin of left vertebral artery. Left vertebral artery dominant without additional stenosis. Skeleton: Cervical spondylosis.  No acute skeletal abnormality. Other neck: Negative for mass or adenopathy. Upper chest: Negative Review of the MIP images confirms the above findings CTA HEAD FINDINGS Anterior circulation: Atherosclerotic calcification in the cavernous carotid bilaterally without significant stenosis. Anterior and middle cerebral arteries patent without significant stenosis or large vessel occlusion Posterior circulation: Both vertebral arteries contribute to the basilar without stenosis. Basilar widely patent. PICA, superior cerebellar, and posterior cerebral arteries widely patent without stenosis. Venous sinuses: Patent Anatomic variants: None Delayed phase: Normal enhancement on delayed imaging Review of the MIP images confirms the above findings IMPRESSION: 75% diameter stenosis proximal right internal carotid artery due to atherosclerotic stenosis 50% diameter stenosis proximal left internal carotid artery due to atherosclerotic stenosis Moderate stenosis origin right vertebral artery and mild stenosis origin left vertebral artery No emergent  large vessel intracranial occlusion. Electronically Signed   By: CFranchot GalloM.D.   On: 11/16/2017 14:23   Ct Angio Neck W Or Wo Contrast  Result Date: 11/16/2017 CLINICAL DATA:  Stroke EXAM: CT ANGIOGRAPHY HEAD AND NECK TECHNIQUE: Multidetector CT imaging of the head and neck was performed using the standard protocol during bolus administration of intravenous contrast. Multiplanar CT image reconstructions and MIPs were obtained to evaluate the vascular anatomy. Carotid stenosis measurements (when applicable) are obtained utilizing NASCET criteria, using the distal internal carotid diameter as the denominator. CONTRAST:  770mOMNIPAQUE IOHEXOL 350 MG/ML SOLN COMPARISON:  MRI head 11/16/2017 FINDINGS: CTA NECK FINDINGS Aortic arch: Moderate atherosclerotic calcification in the aortic arch and proximal great vessels. Mild stenosis left subclavian artery. Remaining great vessels widely patent. Right carotid system: Right common carotid artery patent. Calcified and noncalcified plaque at the right carotid bifurcation. 75% diameter stenosis proximal right internal carotid artery. 50% diameter stenosis proximal right external carotid artery. Left carotid system: Left common carotid artery widely patent. Calcified plaque at the left carotid bifurcation narrowing the lumen by 50% diameter stenosis. Vertebral arteries: Moderate to severe stenosis origin of right vertebral artery. Rest of the right vertebral artery widely patent to the basilar Mild stenosis origin of left vertebral artery. Left vertebral artery dominant without additional stenosis. Skeleton: Cervical spondylosis.  No acute skeletal abnormality. Other neck: Negative for mass or adenopathy. Upper chest: Negative Review of the MIP images confirms the above findings CTA HEAD FINDINGS Anterior circulation: Atherosclerotic calcification in the cavernous carotid bilaterally without significant stenosis. Anterior and middle cerebral arteries patent without  significant stenosis or large vessel occlusion Posterior circulation: Both vertebral arteries contribute to the basilar without stenosis. Basilar widely patent. PICA, superior cerebellar, and posterior cerebral arteries widely patent without stenosis. Venous sinuses: Patent Anatomic variants: None Delayed phase: Normal enhancement on delayed imaging Review of the MIP images confirms  the above findings IMPRESSION: 75% diameter stenosis proximal right internal carotid artery due to atherosclerotic stenosis 50% diameter stenosis proximal left internal carotid artery due to atherosclerotic stenosis Moderate stenosis origin right vertebral artery and mild stenosis origin left vertebral artery No emergent large vessel intracranial occlusion. Electronically Signed   By: Franchot Gallo M.D.   On: 11/16/2017 14:23   Mr Brain Wo Contrast (neuro Protocol)  Result Date: 11/16/2017 CLINICAL DATA:  77 year old female. Code stroke. Abnormal left upper extremity sensation this morning. EXAM: MRI HEAD WITHOUT CONTRAST TECHNIQUE: Multiplanar, multiecho pulse sequences of the brain and surrounding structures were obtained without intravenous contrast. COMPARISON:  Head CT without contrast 0741 hours. Brain MRI 12/31/2013. FINDINGS: Brain: There are scattered small cortical and subcortical white matter foci of restricted diffusion in the right frontal lobe middle and superior frontal gyri as well as the anterior superior parietal lobe. Peri rolandic cortex in the region of left upper extremity representation is involved on series 100, image 46. There is minimal associated T2 and FLAIR hyperintensity. No hemorrhage or mass effect. No contralateral left hemisphere, posterior circulation, or posterior fossa restricted diffusion. Mild patchy T2 hyperintensity in the left corona radiata most resembles of a chronic lacunar infarct. No cortical encephalomalacia or chronic cerebral blood products identified. Negative bilateral deep gray  matter nuclei, brainstem, and cerebellum. No midline shift, mass effect, evidence of mass lesion, ventriculomegaly, extra-axial collection or acute intracranial hemorrhage. Cervicomedullary junction and pituitary are within normal limits. Vascular: Major intracranial vascular flow voids are preserved with generalized intracranial artery ectasia. The distal left vertebral artery is dominant. However, there is asymmetric increased STIR signal within a posterior sylvian right MCA branch on series 8, image 29 which is suspicious for slow flow or occlusion. Skull and upper cervical spine: Negative visible cervical spine. Normal bone marrow signal. Sinuses/Orbits: Postoperative changes to both globes. Paranasal sinuses and mastoids are stable and well pneumatized. Other: Visible internal auditory structures appear normal. Scalp and face soft tissues appear negative. IMPRESSION: 1. Positive for scattered small acute infarcts in the Right MCA territory, including the superior right peri rolandic cortex in the area of left upper extremity representation. No associated hemorrhage or mass effect. 2. Possible associated slow flow or occlusion of a posterior right MCA distal M2 or M3 branch. MRA or CTA could evaluate further. 3. Underlying small chronic lacunar infarct of the left corona radiata, but no other chronic ischemic changes identified in the brain. Electronically Signed   By: Genevie Ann M.D.   On: 11/16/2017 11:07   US Carotid Bilateral (at [facility] Only)  Result Date: 11/17/2017 CLINICAL DATA:  77 year old female with symptoms of transient ischemic attack and bilateral ICA stenosis as identified on recent neck CTA. EXAM: BILATERAL CAROTID DUPLEX ULTRASOUND TECHNIQUE: Pearline Cables scale imaging, color Doppler and duplex ultrasound were performed of bilateral carotid and vertebral arteries in the neck. COMPARISON:  CTA neck 11/16/2017 FINDINGS: Criteria: Quantification of carotid stenosis is based on velocity parameters that  correlate the residual internal carotid diameter with NASCET-based stenosis levels, using the diameter of the distal internal carotid lumen as the denominator for stenosis measurement. The following velocity measurements were obtained: RIGHT ICA:  229/44 cm/sec CCA:  885/02 cm/sec SYSTOLIC ICA/CCA RATIO:  2.1 DIASTOLIC ICA/CCA RATIO:  2.5 ECA:  201 cm/sec LEFT ICA:  155/29 cm/sec CCA:  774/12 cm/sec SYSTOLIC ICA/CCA RATIO:  1.2 DIASTOLIC ICA/CCA RATIO:  1.8 ECA:  188 cm/sec RIGHT CAROTID ARTERY: Heterogeneous and bulky calcified atherosclerotic plaque in the  proximal internal carotid artery. By peak systolic velocity criteria, the estimated stenosis is greater than 70%. There is evidence of spectral broadening, high velocity jetting and aliasing. RIGHT VERTEBRAL ARTERY:  Patent with normal antegrade flow. LEFT CAROTID ARTERY: Focal heterogeneous calcified plaque in the proximal internal carotid artery. By peak systolic velocity criteria, the estimated stenosis falls in the 50-69% diameter range. LEFT VERTEBRAL ARTERY:  Patent with normal antegrade flow. IMPRESSION: 1. Severe (70-99%) stenosis proximal right internal carotid artery secondary to bulky, heterogeneous, irregular and partially calcified atherosclerotic plaque. 2. Moderate (50-69%) stenosis proximal left internal carotid artery secondary to heterogenous atherosclerotic plaque. 3. Vertebral arteries are patent with normal antegrade flow. 4. Sonographic findings are concordant with recent CTA neck. Signed, Criselda Peaches, MD [facility] Radiology Electronically Signed   By: Jacqulynn Cadet M.D.   On: 11/17/2017 08:38   Ct Head Code Stroke Wo Contrast  Addendum Date: 11/16/2017   ADDENDUM REPORT: 11/16/2017 08:21 ADDENDUM: Study discussed by telephone with Dr. Darel Hong on 11/16/2017 at 0803 hours. Electronically Signed   By: Genevie Ann M.D.   On: 11/16/2017 08:21   Result Date: 11/16/2017 CLINICAL  DATA:  Code stroke. 77 year old female with abnormal left upper extremity sensation this morning. EXAM: CT HEAD WITHOUT CONTRAST TECHNIQUE: Contiguous axial images were obtained from the base of the skull through the vertex without intravenous contrast. COMPARISON:  Head CT without contrast 01/19/2014. FINDINGS: Brain: Cerebral volume remains normal for age. No midline shift, ventriculomegaly, mass effect, evidence of mass lesion, intracranial hemorrhage or evidence of cortically based acute infarction. Gray-white matter differentiation is within normal limits throughout the brain. Punctate dystrophic or atherosclerotic calcification along the right middle frontal gyrus (sagittal image 12). Vascular: Calcified atherosclerosis at the skull base. No suspicious intracranial vascular hyperdensity. Skull: Stable, negative. Sinuses : Remain clear. Other: Stable and negative visible orbit and scalp soft tissues. ASPECTS ([place] Stroke Program Early CT Score) - Ganglionic level infarction (caudate, lentiform nuclei, internal capsule, insula, M1-M3 cortex): 7 - Supraganglionic infarction (M4-M6 cortex): 3 Total score (0-10 with 10 being normal): 10 IMPRESSION: 1. Stable and normal for age noncontrast CT appearance of the brain. 2. ASPECTS is 10. Electronically Signed: By: Genevie Ann M.D. On: 11/16/2017 07:56      Assessment/Plan 1. 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  However, the patient has now progressed and has a right ICA lesion that is >75%.  Patient's CT angiography of the carotid arteries confirms >75% Right ICA stenosis.  The anatomical considerations support surgery over stenting.  This was discussed in detail with the patient.  The patient does indeed need surgery, therefore, cardiac clearance will be arranged. Once cleared the patient will be scheduled for surgery.  The risks, benefits and alternative  therapies were reviewed in detail with the patient.  All questions were answered.  The patient agrees to proceed with surgery of the right carotid artery.  Continue antiplatelet therapy as prescribed. Continue management of CAD, HTN and Hyperlipidemia. Healthy heart diet, encouraged exercise at least 4 times per week.    2. TIA (transient ischemic attack) See #1  3. Benign hypertension Continue antihypertensive medications as already ordered, these medications have been reviewed and there are no changes at this time.   4. Mixed hyperlipidemia Continue statin as ordered and reviewed, no changes at this time     Hortencia Pilar, MD  11/25/2017 11:54 AM

## 2017-11-26 ENCOUNTER — Telehealth (INDEPENDENT_AMBULATORY_CARE_PROVIDER_SITE_OTHER): Payer: Self-pay

## 2017-11-26 NOTE — Telephone Encounter (Signed)
Patient's husband called to see if the patient is okay to continue going to the gym to work out, or if that would be too much with her recently having mini strokes?  I spoke with Dr. Delana Meyer and he states that the patient can continue to exercise as long as there is no heavy lifting involved. I am calling the patient's husband back to let him know what has been advised.

## 2017-11-27 ENCOUNTER — Encounter: Payer: Self-pay | Admitting: Obstetrics and Gynecology

## 2017-11-27 ENCOUNTER — Ambulatory Visit (INDEPENDENT_AMBULATORY_CARE_PROVIDER_SITE_OTHER): Payer: Medicare Other | Admitting: Obstetrics and Gynecology

## 2017-11-27 VITALS — BP 131/68 | HR 66 | Ht 63.0 in | Wt 95.6 lb

## 2017-11-27 DIAGNOSIS — N952 Postmenopausal atrophic vaginitis: Secondary | ICD-10-CM

## 2017-11-27 DIAGNOSIS — R636 Underweight: Secondary | ICD-10-CM | POA: Diagnosis not present

## 2017-11-27 DIAGNOSIS — Z853 Personal history of malignant neoplasm of breast: Secondary | ICD-10-CM | POA: Diagnosis not present

## 2017-11-27 DIAGNOSIS — E7800 Pure hypercholesterolemia, unspecified: Secondary | ICD-10-CM

## 2017-11-27 DIAGNOSIS — Z8673 Personal history of transient ischemic attack (TIA), and cerebral infarction without residual deficits: Secondary | ICD-10-CM | POA: Diagnosis not present

## 2017-11-27 DIAGNOSIS — Z01419 Encounter for gynecological examination (general) (routine) without abnormal findings: Secondary | ICD-10-CM

## 2017-11-27 DIAGNOSIS — N819 Female genital prolapse, unspecified: Secondary | ICD-10-CM | POA: Diagnosis not present

## 2017-11-27 NOTE — Progress Notes (Signed)
ANNUAL PREVENTATIVE CARE GYNECOLOGY  ENCOUNTER NOTE  Subjective:       Kathleen Walls is a 77 y.o. G3P2002 female with a h/o breast cancer here for a routine annual gynecologic exam. The patient is sexually active. The patient is not taking hormone replacement therapy. Patient denies post-menopausal vaginal bleeding. The patient wears seatbelts: yes. The patient participates in regular exercise: no. Has the patient ever been transfused or tattooed?: no. The patient reports that there is not domestic violence in her life.   Of note, patient does report that she was hospitalized within the past several weeks and was recently diagnosed with having a stroke.  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    Gynecologic History No LMP recorded. Patient has had a hysterectomy. Contraception: status post hysterectomy Last Pap: 08/2012, normal.  Last mammogram: 08/2017. Results were: BIRADS 1-Negative Last Colonoscopy: 2009. Diverticulosis in right sigmoid colon found.  Last Dexa Scan: 4 years ago   Obstetric History OB History  Gravida Para Term Preterm AB Living  _0 SAB TAB Ectopic Multiple Live Births               # Outcome Date GA Lbr Len/2nd Weight Sex Delivery Anes PTL Lv  2 Term 1966 [redacted]w[redacted]d  M Vag-Spont     1 Term [id number] M Vag-Spont       Past Medical History:  Diagnosis Date  . [facility]thritis   [facility]    2009 breast cancer stage IIA T2 N0, ER positive, HER-2 negative, 2.5 cm primary tumor, poorly differentiated, status post wide local excision, one sentinel lymph node negative, high-risk Oncotype score 48  . Cataracts, both eyes   . Colitis   . Coronary artery disease   . Detached vitreous humor   . Diverticulitis   . 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 Gastritis   . GERD (gastroesophageal reflux disease)   . 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 Hemorrhoid   . Hemorrhoids   . Hyperlipemia   . 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 Kidney cysts    Left  . Kidney stones   . Lipoma of shoulder 2009   left  . Migraine   . Osteoarthritis   . Osteopenia   . Stented coronary artery   [facility]    Family History  Problem Relation Age of Onset  . Heart disease Mother   . Diabetes Father   . Heart disease Father   . Diabetes Sister   . Diabetes Brother   . Cervical cancer Cousin   . Breast cancer Cousin   . Lung cancer Paternal Aunt   . Prostate cancer Maternal Uncle   . Leukemia Unknown   . Hypertension Unknown     Past Surgical History:  Procedure Laterality Date  . ABDOMINAL HYSTERECTOMY    . benign breast biopsy  2007  . BREAST BIOPSY Left 2007   positive  . BREAST CYST ASPIRATION Right    neg  . BREAST LUMPECTOMY Left 2009    2009 breast cancer stage IIA T2 N0, ER positive, HER-2 negative, 2.5 cm primary tumor, poorly differentiated, status post wide local excision, one sentinel lymph node negative, high-risk Oncotype score 48  . BREAST SURGERY    . CARDIAC CATHETERIZATION N/A 06/22/2015   Procedure:  Left Heart Cath and Coronary Angiography;  Surgeon: Teodoro Spray, MD;  Location: [facility];  Service: Cardiovascular;  Laterality: N/A;  . CARDIAC CATHETERIZATION    . CATARACT EXTRACTION    . COLONOSCOPY    . CORONARY ANGIOPLASTY    . EYE SURGERY    . FOOT FRACTURE SURGERY Left   . PARTIAL HYSTERECTOMY    . UPPER GASTROINTESTINAL ENDOSCOPY      Social History   Socioeconomic History  . Marital status: Married    Spouse name: Not on file  . Number of children: Not on file  . Years of education: Not on file  . Highest education level: Not on file  Occupational History  . Not on file  Social Needs  . Financial resource strain: Not on file  . Food insecurity:    Worry: Not on file    Inability: Not on file  . Transportation needs:    Medical: Not on file    Non-medical: Not on file  Tobacco Use  . Smoking  status: Former Smoker    Types: Cigarettes    Last attempt to quit: 08/14/1993    Years since quitting: 24.3  . Smokeless tobacco: Never Used  Substance and Sexual Activity  . Alcohol use: No    Alcohol/week: 0.0 oz  . Drug use: No  . Sexual activity: Yes    Birth control/protection: None, Post-menopausal  Lifestyle  . Physical activity:    Days per week: Not on file    Minutes per session: Not on file  . Stress: Not on file  Relationships  . Social connections:    Talks on phone: Not on file    Gets together: Not on file    Attends religious service: Not on file    Active member of club or organization: Not on file    Attends meetings of clubs or organizations: Not on file    Relationship status: Not on file  . Intimate partner violence:    Fear of current or ex partner: Not on file    Emotionally abused: Not on file    Physically abused: Not on file    Forced sexual activity: Not on file  Other Topics Concern  . Not on file  Social History Narrative  . Not on file    Current Outpatient Medications on File Prior to Visit  Medication Sig Dispense Refill  . ALPRAZolam (XANAX) 0.25 MG tablet TAKE ONE-HALF TABLET BY MOUTH AT BEDTIME    . aspirin EC 81 MG tablet Take 81 mg by mouth daily.     . Azelaic Acid 15 % cream Apply 1 application topically daily. Reported on 11/18/2015    . Butalbital-APAP-Caffeine (FIORICET PO) 40 mg as needed.    . butalbital-aspirin-caffeine (FIORINAL) 50-325-40 MG per capsule Take 1 capsule by mouth every 4 (four) hours as needed for headache.     . calcium carbonate (TUMS - DOSED IN MG ELEMENTAL CALCIUM) 500 MG chewable tablet Chew 2 tablets by mouth daily.     . Cholecalciferol (VITAMIN D-1000 MAX ST) 1000 UNITS tablet Take 1,000 Units by mouth daily.     . clopidogrel (PLAVIX) 75 MG tablet Take 1 tablet (75 mg total) by mouth daily. 30 tablet 2  . docusate sodium (STOOL SOFTENER) 100 MG capsule Take 100 mg by mouth daily as needed for mild  constipation.     Marland Kitchen donepezil (ARICEPT) 5 MG tablet Take 5 mg by mouth at bedtime.     Marland Kitchen  ezetimibe (ZETIA) 10 MG tablet Take 10 mg by mouth daily.     Marland Kitchen gabapentin (NEURONTIN) 300 MG capsule TAKE ONE CAPSULE BY MOUTH THREE TIMES DAILY    . metoprolol succinate (TOPROL-XL) 25 MG 24 hr tablet Take 25 mg by mouth daily.     . nitroGLYCERIN (NITROSTAT) 0.4 MG SL tablet Place 0.4 mg under the tongue every 5 (five) minutes as needed for chest pain.    Marland Kitchen omeprazole (PRILOSEC) 20 MG capsule Take 20 mg by mouth 2 (two) times daily before a meal.    . sodium chloride (MURO 128) 5 % ophthalmic solution Place 1 drop into the left eye as needed for eye irritation.     No current facility-administered medications on file prior to visit.     Allergies  Allergen Reactions  . Alendronate     Other reaction(s): Unknown  . Alendronate Sodium     Other reaction(s): Unknown  . Atorvastatin     Other reaction(s): Muscle Pain Other reaction(s): Muscle Pain  . Azithromycin Hives  . Ciprofloxacin     Other reaction(s): Headache  . Colestipol     Other reaction(s): Unknown  . Lidocaine Hives  . Lovastatin     Other reaction(s): Headache Other reaction(s): Headache  . Metronidazole     Other reaction(s): Headache  . Niacin     Other reaction(s): Unknown  . Risedronate     Other reaction(s): Unknown  . Risedronate Sodium Other (See Comments)  . Rosuvastatin     Other reaction(s): Unknown Other reaction(s): Unknown  . Simvastatin     Other reaction(s): Headache Other reaction(s): Headache     Review of Systems ROS Review of Systems - General ROS: negative for - chills, fatigue, fever, hot flashes, night sweats, weight gain or weight loss Psychological ROS: negative for - anxiety, decreased libido, depression, mood swings, physical abuse or sexual abuse Ophthalmic ROS: negative for - blurry vision, eye pain or loss of vision ENT ROS: negative for - headaches, hearing change, visual changes or  vocal changes Allergy and Immunology ROS: negative for - hives, itchy/watery eyes or seasonal allergies Hematological and Lymphatic ROS: negative for - bleeding problems, bruising, swollen lymph nodes or weight loss Endocrine ROS: negative for - galactorrhea, hair pattern changes, hot flashes, malaise/lethargy, mood swings, palpitations, polydipsia/polyuria, skin changes, temperature intolerance or unexpected weight changes Breast ROS: negative for - new or changing breast lumps or nipple discharge Respiratory ROS: negative for - cough or shortness of breath Cardiovascular ROS: negative for - chest pain, irregular heartbeat, palpitations or shortness of breath Gastrointestinal ROS: positive for intermittent LLQ abdominal pain. Negative for -  change in bowel habits, or black or bloody stools Genito-Urinary ROS: no dysuria, trouble voiding, or hematuria Musculoskeletal ROS: negative for - joint pain or joint stiffness.  Mild weakness in left arm.  Neurological ROS: negative for - bowel and bladder control changes Dermatological ROS: negative for rash and skin lesion changes   Objective:   BP 131/68   Pulse 66   Ht _0  (1.6 m)   Wt 95 lb 9.6 oz (43.4 kg)   BMI 16.93 kg/m  CONSTITUTIONAL: Well-developed, well-nourished female in no acute distress.  PSYCHIATRIC: Normal mood and affect. Normal behavior. Normal judgment and thought content. [place]: Alert and oriented to person, place, and time. Normal muscle tone coordination. No cranial nerve deficit noted. HENT:  Normocephalic, atraumatic, External right and left ear normal. Oropharynx is clear and moist EYES: Conjunctivae and EOM are normal. Pupils  are equal, round, and reactive to light. No scleral icterus.  NECK: Normal range of motion, supple, no masses.  Normal thyroid.  SKIN: Skin is warm and dry. No rash noted. Not diaphoretic. No erythema. No pallor. CARDIOVASCULAR: Normal heart rate noted, regular rhythm, no  murmur. RESPIRATORY: Clear to auscultation bilaterally. Effort and breath sounds normal, no problems with respiration noted. BREASTS: Symmetric in size. No masses, skin changes, nipple drainage, or lymphadenopathy. ABDOMEN: Soft, normal bowel sounds, no distention noted.  No tenderness, rebound or guarding.  BLADDER: Normal PELVIC:  Bladder no bladder distension noted  Urethra: normal appearing urethra with no masses, tenderness or lesions (small caruncle noted)  Vulva: normal appearing vulva with no masses, tenderness or lesions  Vagina: atrophic and Pelvic Floor Exam cystocele Grade 1, rectocele Grade 1, vaginal prolapse Grade 2  Cervix: not indicated and surgically absent  Uterus: not indicated and surgically absent, vaginal cuff well healed  Adnexa: normal adnexa in size, nontender and no masses  RV: External Exam NormaI, No Rectal Masses and Normal Sphincter tone  MUSCULOSKELETAL: Normal range of motion. No tenderness.  No cyanosis, clubbing, or edema.  2+ distal pulses. LYMPHATIC: No Axillary, Supraclavicular, or Inguinal Adenopathy.   Labs:  Lab Results  Component Value Date   WBC 7.0 11/16/2017   HGB 14.2 11/16/2017   HCT 44.2 11/16/2017   MCV 80.3 11/16/2017   PLT 237 11/16/2017    Lab Results  Component Value Date   CREATININE 0.90 11/16/2017   BUN 19 11/16/2017   NA 142 11/16/2017   K 4.5 11/16/2017   CL 105 11/16/2017   CO2 31 11/16/2017    Lab Results  Component Value Date   ALT 28 11/16/2017   AST 30 11/16/2017   ALKPHOS 76 11/16/2017   BILITOT 0.5 11/16/2017     Lab Results  Component Value Date   CHOL 271 (H) 11/17/2017   HDL 57 11/17/2017   LDLCALC 187 (H) 11/17/2017   TRIG 135 11/17/2017   CHOLHDL 4.8 11/17/2017    Lab Results  Component Value Date   TSH 2.400 11/17/2017    Lab Results  Component Value Date   HGBA1C 5.9 (H) 11/17/2017   Assessment:   Annual gynecologic examination 77 y.o. Underweight, BMI 16 Vaginal vault prolapse  (cystocele and rectocele) Vaginal atrophy H/o breast cancer Stroke (recently diagnosed) Elevated cholesterol  Plan:  Pap: Not needed.  Patient beyond age 4, and h/o hysterectomy.  Mammogram: 08/2017. Benign. Patient for repeat mammogram next year.  Stool Guaiac Testing:  Not Ordered.  Has h/o Colonoscopy in 2009. Is due for colonoscopy this year, but has had to postpone in light of recently diagnosed stroke and likely need for surgical management.  Labs: Not ordered.  Up to date Routine preventative health maintenance measures emphasized: Alcohol/Substance use risks and Stress Management.  Empahsized Calcium and Vit D supplementation.  Underweight, notes healthy diet, normal appetite.  Patient with petite frame.  Can increase protein intake, or drink Boost/Ensure for supplementation.  Vaginal vault prolapse - patient relatively asymptomatic (no bowel or urinary disturbances, incontinence, vaginal bulge).  No treatment desired currently.  Vaginal atrophy - patient without complaints, no intervention necessary currently.   Stroke- recently diagnosed. Undergoing management with Vascular Surgery/Cardiology.  Elevated cholesterol - managed by PCP.   Return to [place], MD  [facility]

## 2017-11-27 NOTE — Progress Notes (Signed)
Pt is not having any itching and burning in the vaginal area. No discharge.

## 2017-11-27 NOTE — Patient Instructions (Signed)
Health Maintenance for Postmenopausal Women Menopause is a normal process in which your reproductive ability comes to an end. This process happens gradually over a span of months to years, usually between the ages of 22 and 9. Menopause is complete when you have missed 12 consecutive menstrual periods. It is important to talk with your health care provider about some of the most common conditions that affect postmenopausal women, such as heart disease, cancer, and bone loss (osteoporosis). Adopting a healthy lifestyle and getting preventive care can help to promote your health and wellness. Those actions can also lower your chances of developing some of these common conditions. What should I know about menopause? During menopause, you may experience a number of symptoms, such as:  Moderate-to-severe hot flashes.  Night sweats.  Decrease in sex drive.  Mood swings.  Headaches.  Tiredness.  Irritability.  Memory problems.  Insomnia.  Choosing to treat or not to treat menopausal changes is an individual decision that you make with your health care provider. What should I know about hormone replacement therapy and supplements? Hormone therapy products are effective for treating symptoms that are associated with menopause, such as hot flashes and night sweats. Hormone replacement carries certain risks, especially as you become older. If you are thinking about using estrogen or estrogen with progestin treatments, discuss the benefits and risks with your health care provider. What should I know about heart disease and stroke? Heart disease, heart attack, and stroke become more likely as you age. This may be due, in part, to the hormonal changes that your body experiences during menopause. These can affect how your body processes dietary fats, triglycerides, and cholesterol. Heart attack and stroke are both medical emergencies. There are many things that you can do to help prevent heart disease  and stroke:  Have your blood pressure checked at least every 1-2 years. High blood pressure causes heart disease and increases the risk of stroke.  If you are 53-22 years old, ask your health care provider if you should take aspirin to prevent a heart attack or a stroke.  Do not use any tobacco products, including cigarettes, chewing tobacco, or electronic cigarettes. If you need help quitting, ask your health care provider.  It is important to eat a healthy diet and maintain a healthy weight. ? Be sure to include plenty of vegetables, fruits, low-fat dairy products, and lean protein. ? Avoid eating foods that are high in solid fats, added sugars, or salt (sodium).  Get regular exercise. This is one of the most important things that you can do for your health. ? Try to exercise for at least 150 minutes each week. The type of exercise that you do should increase your heart rate and make you sweat. This is known as moderate-intensity exercise. ? Try to do strengthening exercises at least twice each week. Do these in addition to the moderate-intensity exercise.  Know your numbers.Ask your health care provider to check your cholesterol and your blood glucose. Continue to have your blood tested as directed by your health care provider.  What should I know about cancer screening? There are several types of cancer. Take the following steps to reduce your risk and to catch any cancer development as early as possible. Breast Cancer  Practice breast self-awareness. ? This means understanding how your breasts normally appear and feel. ? It also means doing regular breast self-exams. Let your health care provider know about any changes, no matter how small.  If you are 40  or older, have a clinician do a breast exam (clinical breast exam or CBE) every year. Depending on your age, family history, and medical history, it may be recommended that you also have a yearly breast X-ray (mammogram).  If you  have a family history of breast cancer, talk with your health care provider about genetic screening.  If you are at high risk for breast cancer, talk with your health care provider about having an MRI and a mammogram every year.  Breast cancer (BRCA) gene test is recommended for women who have family members with BRCA-related cancers. Results of the assessment will determine the need for genetic counseling and BRCA1 and for BRCA2 testing. BRCA-related cancers include these types: ? Breast. This occurs in males or females. ? Ovarian. ? Tubal. This may also be called fallopian tube cancer. ? Cancer of the abdominal or pelvic lining (peritoneal cancer). ? Prostate. ? Pancreatic.  Cervical, Uterine, and Ovarian Cancer Your health care provider may recommend that you be screened regularly for cancer of the pelvic organs. These include your ovaries, uterus, and vagina. This screening involves a pelvic exam, which includes checking for microscopic changes to the surface of your cervix (Pap test).  For women ages 21-65, health care providers may recommend a pelvic exam and a Pap test every three years. For women ages 79-65, they may recommend the Pap test and pelvic exam, combined with testing for human papilloma virus (HPV), every five years. Some types of HPV increase your risk of cervical cancer. Testing for HPV may also be done on women of any age who have unclear Pap test results.  Other health care providers may not recommend any screening for nonpregnant women who are considered low risk for pelvic cancer and have no symptoms. Ask your health care provider if a screening pelvic exam is right for you.  If you have had past treatment for cervical cancer or a condition that could lead to cancer, you need Pap tests and screening for cancer for at least 20 years after your treatment. If Pap tests have been discontinued for you, your risk factors (such as having a new sexual partner) need to be  reassessed to determine if you should start having screenings again. Some women have medical problems that increase the chance of getting cervical cancer. In these cases, your health care provider may recommend that you have screening and Pap tests more often.  If you have a family history of uterine cancer or ovarian cancer, talk with your health care provider about genetic screening.  If you have vaginal bleeding after reaching menopause, tell your health care provider.  There are currently no reliable tests available to screen for ovarian cancer.  Lung Cancer Lung cancer screening is recommended for adults 69-62 years old who are at high risk for lung cancer because of a history of smoking. A yearly low-dose CT scan of the lungs is recommended if you:  Currently smoke.  Have a history of at least 30 pack-years of smoking and you currently smoke or have quit within the past 15 years. A pack-year is smoking an average of one pack of cigarettes per day for one year.  Yearly screening should:  Continue until it has been 15 years since you quit.  Stop if you develop a health problem that would prevent you from having lung cancer treatment.  Colorectal Cancer  This type of cancer can be detected and can often be prevented.  Routine colorectal cancer screening usually begins at  age 42 and continues through age 45.  If you have risk factors for colon cancer, your health care provider may recommend that you be screened at an earlier age.  If you have a family history of colorectal cancer, talk with your health care provider about genetic screening.  Your health care provider may also recommend using home test kits to check for hidden blood in your stool.  A small camera at the end of a tube can be used to examine your colon directly (sigmoidoscopy or colonoscopy). This is done to check for the earliest forms of colorectal cancer.  Direct examination of the colon should be repeated every  5-10 years until age 71. However, if early forms of precancerous polyps or small growths are found or if you have a family history or genetic risk for colorectal cancer, you may need to be screened more often.  Skin Cancer  Check your skin from head to toe regularly.  Monitor any moles. Be sure to tell your health care provider: ? About any new moles or changes in moles, especially if there is a change in a mole's shape or color. ? If you have a mole that is larger than the size of a pencil eraser.  If any of your family members has a history of skin cancer, especially at a young age, talk with your health care provider about genetic screening.  Always use sunscreen. Apply sunscreen liberally and repeatedly throughout the day.  Whenever you are outside, protect yourself by wearing long sleeves, pants, a wide-brimmed hat, and sunglasses.  What should I know about osteoporosis? Osteoporosis is a condition in which bone destruction happens more quickly than new bone creation. After menopause, you may be at an increased risk for osteoporosis. To help prevent osteoporosis or the bone fractures that can happen because of osteoporosis, the following is recommended:  If you are 46-71 years old, get at least 1,000 mg of calcium and at least 600 mg of vitamin D per day.  If you are older than age 55 but younger than age 65, get at least 1,200 mg of calcium and at least 600 mg of vitamin D per day.  If you are older than age 54, get at least 1,200 mg of calcium and at least 800 mg of vitamin D per day.  Smoking and excessive alcohol intake increase the risk of osteoporosis. Eat foods that are rich in calcium and vitamin D, and do weight-bearing exercises several times each week as directed by your health care provider. What should I know about how menopause affects my mental health? Depression may occur at any age, but it is more common as you become older. Common symptoms of depression  include:  Low or sad mood.  Changes in sleep patterns.  Changes in appetite or eating patterns.  Feeling an overall lack of motivation or enjoyment of activities that you previously enjoyed.  Frequent crying spells.  Talk with your health care provider if you think that you are experiencing depression. What should I know about immunizations? It is important that you get and maintain your immunizations. These include:  Tetanus, diphtheria, and pertussis (Tdap) booster vaccine.  Influenza every year before the flu season begins.  Pneumonia vaccine.  Shingles vaccine.  Your health care provider may also recommend other immunizations. This information is not intended to replace advice given to you by your health care provider. Make sure you discuss any questions you have with your health care provider. Document Released: 09/22/2005  Document Revised: 02/18/2016 Document Reviewed: 05/04/2015 [employer]vier Interactive Patient Education  2018 [employer]  

## 2017-12-01 ENCOUNTER — Encounter: Payer: Self-pay | Admitting: Obstetrics and Gynecology

## 2017-12-05 ENCOUNTER — Encounter (INDEPENDENT_AMBULATORY_CARE_PROVIDER_SITE_OTHER): Payer: Self-pay

## 2017-12-10 ENCOUNTER — Other Ambulatory Visit (INDEPENDENT_AMBULATORY_CARE_PROVIDER_SITE_OTHER): Payer: Self-pay | Admitting: Vascular Surgery

## 2017-12-14 ENCOUNTER — Encounter
Admission: RE | Admit: 2017-12-14 | Discharge: 2017-12-14 | Disposition: A | Discharge status: Home / Self Care | Payer: Medicare Other | Source: Ambulatory Visit | Attending: Vascular Surgery | Admitting: Vascular Surgery

## 2017-12-14 ENCOUNTER — Other Ambulatory Visit: Payer: Self-pay

## 2017-12-14 DIAGNOSIS — I6529 Occlusion and stenosis of unspecified carotid artery: Secondary | ICD-10-CM | POA: Diagnosis not present

## 2017-12-14 DIAGNOSIS — Z01812 Encounter for preprocedural laboratory examination: Secondary | ICD-10-CM | POA: Insufficient documentation

## 2017-12-14 HISTORY — DX: Other complications of anesthesia, initial encounter: T88.59XA

## 2017-12-14 HISTORY — DX: Personal history of urinary calculi: Z87.442

## 2017-12-14 HISTORY — DX: Adverse effect of unspecified anesthetic, initial encounter: T41.45XA

## 2017-12-14 LAB — PROTIME-INR
INR: 0.99
Prothrombin Time: 13 seconds (ref 11.4–15.2)

## 2017-12-14 LAB — TYPE AND SCREEN
ABO/RH(D): A POS
ANTIBODY SCREEN: NEGATIVE

## 2017-12-14 LAB — APTT: APTT: 26 s (ref 24–36)

## 2017-12-14 NOTE — Patient Instructions (Signed)
Your procedure is scheduled on:  Report to [facility]. To find out your arrival time please call [phone] between [place].  Remember: Instructions that are not followed completely may result in serious medical risk, up to and including death, or upon the discretion of your surgeon and anesthesiologist your surgery may need to be rescheduled.     _X__ 1. Do not eat food after midnight the night before your procedure.                 No gum chewing or hard candies. You may drink clear liquids up to 2 hours                 before you are scheduled to arrive for your surgery- DO not drink clear                 liquids within 2 hours of the start of your surgery.                 Clear Liquids include:  water, apple juice without pulp, clear carbohydrate                 drink such as Clearfast or Gatorade, Black Coffee or Tea (Do not add                 anything to coffee or tea).  __X__2.  On the morning of surgery brush your teeth with toothpaste and water, you                 may rinse your mouth with mouthwash if you wish.  Do not swallow any              toothpaste of mouthwash.     _X__ 3.  No Alcohol for 24 hours before or after surgery.   _X__ 4.  Do Not Smoke or use e-cigarettes For 24 Hours Prior to Your Surgery.                 Do not use any chewable tobacco products for at least 6 hours prior to                 surgery.  ____  5.  Bring all medications with you on the day of surgery if instructed.   __X__  6.  Notify your doctor if there is any change in your medical condition      (cold, fever, infections).     Do not wear jewelry, make-up, hairpins, clips or nail polish. Do not wear lotions, powders, or perfumes.  Do not shave 48 hours prior to surgery. Men may shave face and neck. Do not bring valuables to the hospital.    [facility] is not responsible for any belongings or  valuables.  Contacts, dentures/partials or body piercings may not be worn into surgery. Bring a case for your contacts, glasses or hearing aids, a denture cup will be supplied. Leave your suitcase in the car. After surgery it may be brought to your room. For patients admitted to the hospital, discharge time is determined by your treatment team.   Patients discharged the day of surgery will not be allowed to drive home.   Please read over the following fact sheets that you were given:   MRSA Information  __X__ Take these medicines the morning of surgery with A SIP OF WATER:  1. ZETIA  2. GABAPENTIN  3. METOPROLOL  4. OMEPRAZOLE  5.  6.  ____ Fleet Enema (as directed)   __X__ Use CHG Soap/SAGE wipes as directed  ____ Use inhalers on the day of surgery  ____ Stop metformin/Janumet/Farxiga 2 days prior to surgery    ____ Take 1/2 of usual insulin dose the night before surgery. No insulin the morning          of surgery.   __X__ Stop Blood Thinners Coumadin/Plavix/Xarelto/Pleta/Pradaxa/Eliquis/Effient/Aspirin 5 DAYS BEFORE AND CONTINUE YOUR ASPIRIN BUT DO NOT TAKE THE DAY OF PROCEDURE   __X__ Stop Anti-inflammatories 7 days before surgery such as Advil, Ibuprofen, Motrin,  BC or Goodies Powder, Naprosyn, Naproxen, Aleve,     __X__ Stop all herbal supplements, fish oil or vitamin E until after surgery.  7 DAYS  ____ Bring C-Pap to the hospital.

## 2017-12-14 NOTE — Pre-Procedure Instructions (Signed)
NM myocardial perfusion SPECT multiple (stress and rest)12/04/2017 [place] Result Impression   Negative Lexiscan stress.LV function normal.Low risk study.  Other Result Information  This result has an attachment that is not available.  Result Narrative  CARDIOLOGY DEPARTMENT [facility] [address] [place], [id number] [phone]  Procedure: Pharmacologic Myocardial Perfusion Imaging ONE day procedure  Indication: Coronary artery disease of native artery of native heart with  stable angina pectoris (CMS-HCC) Plan: NM myocardial perfusion SPECT multiple (stress  and rest), ECG stress test only  Ordering Physician:   Dr. Bartholome Bill   Clinical History: 77 y.o. year old female Vitals: Height: 53 in Weight: 94 lb Cardiac risk factors include:  CVA, Hyperlipidemia, PCI, HTN and CAD    Procedure:  Pharmacologic stress testing was performed with Regadenoson using a single  use 0.4mg /37ml (0.08 mg/ml) prefilled syringe intravenously infused as a  bolus dose. The stress test was stopped due to Infusion completion.Blood  pressure response was normal. The patient did not develop any symptoms  other than fatigue during the procedure.   Rest HR: 63bpm Rest BP: 150/29mmHg Max HR: 88bpm Min BP: 120/60mmHg Stress Test Administered by: Oswald Hillock, CMA ECG Interpretation: Rest BSJ:GGEZMO sinus rhythm, none Stress QHU:TMLYYT sinus rhythm, no arrhythmia or ischemia Recovery KPT:WSFKCL sinus rhythm ECG Interpretation:non-diagnostic due to pharmacologic testing.   Administrations This Visit  regadenoson (LEXISCAN) 0.4 mg/5 mL inj syringe 0.4 mg  Admin Date 12/04/2017 [place] Given Dose 0.4 mg Route Intravenous Administered By Herbert Seta, CNMT     technetium Tc62m sestamibi (CARDIOLITE) injection 27.51 millicurie  Admin Date 70/08/7492 Action Given Dose 49.67 millicurie  Route Intravenous Administered By Herbert Seta, CNMT     technetium Tc5m sestamibi (CARDIOLITE) injection 59.16 millicurie  Admin Date 38/46/6599 Action Given Dose 35.70 millicurie Route Intravenous Administered By Herbert Seta, CNMT       Gated post-stress perfusion imaging was performed 30 minutes after stress.  Rest images were performed 30 minutes after injection.  Gated LV Analysis:  TID Ratio: 1.19  LVEF= 68%  FINDINGS: Regional wall motion:reveals normal myocardial thickening and wall  motion. The overall quality of the study is good. Artifacts noted: no Left ventricular cavity: normal.  Perfusion Analysis:SPECT images demonstrate homogeneous tracer  distribution throughout the myocardium.  Status

## 2017-12-25 ENCOUNTER — Telehealth (INDEPENDENT_AMBULATORY_CARE_PROVIDER_SITE_OTHER): Payer: Self-pay

## 2017-12-25 ENCOUNTER — Telehealth (INDEPENDENT_AMBULATORY_CARE_PROVIDER_SITE_OTHER): Payer: Self-pay | Admitting: Vascular Surgery

## 2017-12-25 MED ORDER — CEFAZOLIN SODIUM-DEXTROSE 2-4 GM/100ML-% IV SOLN
2.0000 g | INTRAVENOUS | Status: AC
  Administered 2017-12-26: 2 g via INTRAVENOUS

## 2017-12-25 NOTE — Telephone Encounter (Signed)
ERROR

## 2017-12-25 NOTE — Telephone Encounter (Addendum)
TYPED IN ERROR.

## 2017-12-25 NOTE — Telephone Encounter (Signed)
THIS WAS TYPED IN ERROR.

## 2017-12-26 ENCOUNTER — Inpatient Hospital Stay: Payer: Medicare Other | Admitting: Anesthesiology

## 2017-12-26 ENCOUNTER — Other Ambulatory Visit: Payer: Self-pay

## 2017-12-26 ENCOUNTER — Inpatient Hospital Stay
Admission: RE | Admit: 2017-12-26 | Discharge: 2017-12-27 | DRG: 039 | Disposition: A | Discharge status: Home / Self Care | Payer: Medicare Other | Attending: Vascular Surgery | Admitting: Vascular Surgery

## 2017-12-26 ENCOUNTER — Encounter
Admission: RE | Disposition: A | Discharge status: Home / Self Care | Payer: Self-pay | Source: Home / Self Care | Attending: Vascular Surgery

## 2017-12-26 DIAGNOSIS — I6523 Occlusion and stenosis of bilateral carotid arteries: Secondary | ICD-10-CM | POA: Diagnosis present

## 2017-12-26 DIAGNOSIS — I6529 Occlusion and stenosis of unspecified carotid artery: Secondary | ICD-10-CM | POA: Diagnosis present

## 2017-12-26 DIAGNOSIS — I6521 Occlusion and stenosis of right carotid artery: Secondary | ICD-10-CM | POA: Diagnosis not present

## 2017-12-26 DIAGNOSIS — Z87442 Personal history of urinary calculi: Secondary | ICD-10-CM

## 2017-12-26 DIAGNOSIS — Z7982 Long term (current) use of aspirin: Secondary | ICD-10-CM

## 2017-12-26 DIAGNOSIS — I10 Essential (primary) hypertension: Secondary | ICD-10-CM | POA: Diagnosis present

## 2017-12-26 DIAGNOSIS — Z888 Allergy status to other drugs, medicaments and biological substances status: Secondary | ICD-10-CM

## 2017-12-26 DIAGNOSIS — Z8249 Family history of ischemic heart disease and other diseases of the circulatory system: Secondary | ICD-10-CM | POA: Diagnosis not present

## 2017-12-26 DIAGNOSIS — Z803 Family history of malignant neoplasm of breast: Secondary | ICD-10-CM | POA: Diagnosis not present

## 2017-12-26 DIAGNOSIS — M8580 Other specified disorders of bone density and structure, unspecified site: Secondary | ICD-10-CM | POA: Diagnosis present

## 2017-12-26 DIAGNOSIS — Z955 Presence of coronary angioplasty implant and graft: Secondary | ICD-10-CM

## 2017-12-26 DIAGNOSIS — Z90710 Acquired absence of both cervix and uterus: Secondary | ICD-10-CM

## 2017-12-26 DIAGNOSIS — E782 Mixed hyperlipidemia: Secondary | ICD-10-CM | POA: Diagnosis present

## 2017-12-26 DIAGNOSIS — Z881 Allergy status to other antibiotic agents status: Secondary | ICD-10-CM | POA: Diagnosis not present

## 2017-12-26 DIAGNOSIS — K219 Gastro-esophageal reflux disease without esophagitis: Secondary | ICD-10-CM | POA: Diagnosis present

## 2017-12-26 DIAGNOSIS — M1990 Unspecified osteoarthritis, unspecified site: Secondary | ICD-10-CM | POA: Diagnosis present

## 2017-12-26 DIAGNOSIS — I2510 Atherosclerotic heart disease of native coronary artery without angina pectoris: Secondary | ICD-10-CM | POA: Diagnosis present

## 2017-12-26 DIAGNOSIS — Z87891 Personal history of nicotine dependence: Secondary | ICD-10-CM

## 2017-12-26 DIAGNOSIS — Z8673 Personal history of transient ischemic attack (TIA), and cerebral infarction without residual deficits: Secondary | ICD-10-CM

## 2017-12-26 DIAGNOSIS — Z884 Allergy status to anesthetic agent status: Secondary | ICD-10-CM

## 2017-12-26 DIAGNOSIS — Z7902 Long term (current) use of antithrombotics/antiplatelets: Secondary | ICD-10-CM | POA: Diagnosis not present

## 2017-12-26 DIAGNOSIS — Z853 Personal history of malignant neoplasm of breast: Secondary | ICD-10-CM | POA: Diagnosis not present

## 2017-12-26 HISTORY — PX: ENDARTERECTOMY: SHX5162

## 2017-12-26 LAB — GLUCOSE, CAPILLARY: GLUCOSE-CAPILLARY: 109 mg/dL — AB (ref 65–99)

## 2017-12-26 LAB — ABO/RH: ABO/RH(D): A POS

## 2017-12-26 SURGERY — ENDARTERECTOMY, CAROTID
Anesthesia: General | Laterality: Right | Wound class: Clean

## 2017-12-26 MED ORDER — HYDROCODONE-ACETAMINOPHEN 5-325 MG PO TABS
1.0000 | ORAL_TABLET | ORAL | Status: DC | PRN
  Administered 2017-12-26: 1 via ORAL
  Filled 2017-12-26: qty 1

## 2017-12-26 MED ORDER — ACETAMINOPHEN 325 MG PO TABS
325.0000 mg | ORAL_TABLET | ORAL | Status: DC | PRN
  Administered 2017-12-26: 650 mg via ORAL
  Filled 2017-12-26: qty 2

## 2017-12-26 MED ORDER — FENTANYL CITRATE (PF) 100 MCG/2ML IJ SOLN
INTRAMUSCULAR | Status: DC | PRN
  Administered 2017-12-26: 100 ug via INTRAVENOUS

## 2017-12-26 MED ORDER — CHLORHEXIDINE GLUCONATE CLOTH 2 % EX PADS: 6.0000 | MEDICATED_PAD | Freq: Once | CUTANEOUS | Status: DC

## 2017-12-26 MED ORDER — EPHEDRINE SULFATE 50 MG/ML IJ SOLN
INTRAMUSCULAR | Status: DC | PRN
  Administered 2017-12-26: 2 mg via INTRAVENOUS
  Administered 2017-12-26: 5 mg via INTRAVENOUS

## 2017-12-26 MED ORDER — LACTATED RINGERS IV SOLN
INTRAVENOUS | Status: DC
  Administered 2017-12-26: 12:00:00 via INTRAVENOUS

## 2017-12-26 MED ORDER — ASPIRIN EC 81 MG PO TBEC
81.0000 mg | DELAYED_RELEASE_TABLET | Freq: Every day | ORAL | Status: DC
  Administered 2017-12-27: 81 mg via ORAL
  Filled 2017-12-26: qty 1

## 2017-12-26 MED ORDER — MIDAZOLAM HCL 2 MG/2ML IJ SOLN
INTRAMUSCULAR | Status: AC
  Filled 2017-12-26: qty 2

## 2017-12-26 MED ORDER — FAMOTIDINE 20 MG PO TABS
ORAL_TABLET | ORAL | Status: AC
  Filled 2017-12-26: qty 1

## 2017-12-26 MED ORDER — BUTALBITAL-APAP-CAFFEINE 50-325-40 MG PO TABS: 1.0000 | ORAL_TABLET | Freq: Every day | ORAL | Status: DC | PRN

## 2017-12-26 MED ORDER — KCL IN DEXTROSE-NACL 20-5-0.9 MEQ/L-%-% IV SOLN
INTRAVENOUS | Status: DC
  Administered 2017-12-26: 17:00:00 via INTRAVENOUS
  Filled 2017-12-26 (×3): qty 1000

## 2017-12-26 MED ORDER — SUCCINYLCHOLINE CHLORIDE 20 MG/ML IJ SOLN
INTRAMUSCULAR | Status: AC
  Filled 2017-12-26: qty 1

## 2017-12-26 MED ORDER — ONDANSETRON HCL 4 MG/2ML IJ SOLN
INTRAMUSCULAR | Status: DC | PRN
  Administered 2017-12-26: 4 mg via INTRAVENOUS

## 2017-12-26 MED ORDER — DOCUSATE SODIUM 100 MG PO CAPS
100.0000 mg | ORAL_CAPSULE | Freq: Every day | ORAL | Status: DC | PRN
  Filled 2017-12-26: qty 1

## 2017-12-26 MED ORDER — ALPRAZOLAM 0.25 MG PO TABS
0.1250 mg | ORAL_TABLET | Freq: Every day | ORAL | Status: DC
  Administered 2017-12-26: 0.125 mg via ORAL
  Filled 2017-12-26: qty 1

## 2017-12-26 MED ORDER — FENTANYL CITRATE (PF) 100 MCG/2ML IJ SOLN
25.0000 ug | INTRAMUSCULAR | Status: DC | PRN
  Administered 2017-12-26: 25 ug via INTRAVENOUS

## 2017-12-26 MED ORDER — HEPARIN SODIUM (PORCINE) 1000 UNIT/ML IJ SOLN
INTRAMUSCULAR | Status: DC | PRN
  Administered 2017-12-26: 50 mL via INTRAMUSCULAR

## 2017-12-26 MED ORDER — REMIFENTANIL HCL 1 MG IV SOLR
INTRAVENOUS | Status: AC
  Filled 2017-12-26: qty 1000

## 2017-12-26 MED ORDER — LIDOCAINE HCL (PF) 1 % IJ SOLN
INTRAMUSCULAR | Status: AC
  Filled 2017-12-26: qty 30

## 2017-12-26 MED ORDER — ROCURONIUM BROMIDE 100 MG/10ML IV SOLN
INTRAVENOUS | Status: DC | PRN
  Administered 2017-12-26: 20 mg via INTRAVENOUS
  Administered 2017-12-26: 30 mg via INTRAVENOUS

## 2017-12-26 MED ORDER — METOPROLOL SUCCINATE ER 50 MG PO TB24
25.0000 mg | ORAL_TABLET | Freq: Every day | ORAL | Status: DC
  Filled 2017-12-26: qty 1

## 2017-12-26 MED ORDER — LIDOCAINE HCL (CARDIAC) PF 100 MG/5ML IV SOSY
PREFILLED_SYRINGE | INTRAVENOUS | Status: DC | PRN
  Administered 2017-12-26: 50 mg via INTRAVENOUS

## 2017-12-26 MED ORDER — LABETALOL HCL 5 MG/ML IV SOLN
INTRAVENOUS | Status: AC
  Filled 2017-12-26: qty 4

## 2017-12-26 MED ORDER — AZELAIC ACID 15 % EX GEL: 1.0000 "application " | Freq: Every day | CUTANEOUS | Status: DC

## 2017-12-26 MED ORDER — DEXAMETHASONE SODIUM PHOSPHATE 10 MG/ML IJ SOLN
INTRAMUSCULAR | Status: DC | PRN
  Administered 2017-12-26: 10 mg via INTRAVENOUS

## 2017-12-26 MED ORDER — EZETIMIBE 10 MG PO TABS
10.0000 mg | ORAL_TABLET | Freq: Every day | ORAL | Status: DC
  Administered 2017-12-27: 10 mg via ORAL
  Filled 2017-12-26 (×2): qty 1

## 2017-12-26 MED ORDER — HEPARIN SODIUM (PORCINE) 5000 UNIT/ML IJ SOLN
INTRAMUSCULAR | Status: AC
Start: 2017-12-26 — End: ?
  Filled 2017-12-26: qty 1

## 2017-12-26 MED ORDER — FENTANYL CITRATE (PF) 100 MCG/2ML IJ SOLN
INTRAMUSCULAR | Status: AC
  Administered 2017-12-26: 25 ug via INTRAVENOUS
  Filled 2017-12-26: qty 2

## 2017-12-26 MED ORDER — DONEPEZIL HCL 5 MG PO TABS
10.0000 mg | ORAL_TABLET | Freq: Every day | ORAL | Status: DC
  Administered 2017-12-26: 10 mg via ORAL
  Filled 2017-12-26 (×4): qty 2

## 2017-12-26 MED ORDER — DOCUSATE SODIUM 100 MG PO CAPS
100.0000 mg | ORAL_CAPSULE | Freq: Every day | ORAL | Status: DC
  Administered 2017-12-27: 100 mg via ORAL
  Filled 2017-12-26: qty 1

## 2017-12-26 MED ORDER — PHENOL 1.4 % MT LIQD
1.0000 | OROMUCOSAL | Status: DC | PRN
  Filled 2017-12-26: qty 177

## 2017-12-26 MED ORDER — HYDRALAZINE HCL 20 MG/ML IJ SOLN: 5.0000 mg | INTRAMUSCULAR | Status: DC | PRN

## 2017-12-26 MED ORDER — EPHEDRINE SULFATE 50 MG/ML IJ SOLN
INTRAMUSCULAR | Status: AC
Start: 2017-12-26 — End: ?
  Filled 2017-12-26: qty 1

## 2017-12-26 MED ORDER — PANTOPRAZOLE SODIUM 40 MG PO TBEC
40.0000 mg | DELAYED_RELEASE_TABLET | Freq: Every day | ORAL | Status: DC
  Administered 2017-12-27: 40 mg via ORAL
  Filled 2017-12-26 (×2): qty 1

## 2017-12-26 MED ORDER — CEFAZOLIN SODIUM-DEXTROSE 2-4 GM/100ML-% IV SOLN
INTRAVENOUS | Status: AC
  Filled 2017-12-26: qty 100

## 2017-12-26 MED ORDER — ALUM & MAG HYDROXIDE-SIMETH 200-200-20 MG/5ML PO SUSP
15.0000 mL | ORAL | Status: DC | PRN
  Filled 2017-12-26: qty 30

## 2017-12-26 MED ORDER — DEXAMETHASONE SODIUM PHOSPHATE 10 MG/ML IJ SOLN
INTRAMUSCULAR | Status: AC
  Filled 2017-12-26: qty 1

## 2017-12-26 MED ORDER — MEPERIDINE HCL 50 MG/ML IJ SOLN: 6.2500 mg | INTRAMUSCULAR | Status: DC | PRN

## 2017-12-26 MED ORDER — ONDANSETRON HCL 4 MG/2ML IJ SOLN: 4.0000 mg | Freq: Four times a day (QID) | INTRAMUSCULAR | Status: DC | PRN

## 2017-12-26 MED ORDER — EVICEL 2 ML EX KIT
PACK | CUTANEOUS | Status: DC | PRN
  Administered 2017-12-26: 2 mL

## 2017-12-26 MED ORDER — CLOPIDOGREL BISULFATE 75 MG PO TABS
75.0000 mg | ORAL_TABLET | Freq: Every day | ORAL | Status: DC
  Filled 2017-12-26: qty 1

## 2017-12-26 MED ORDER — FENTANYL CITRATE (PF) 100 MCG/2ML IJ SOLN
INTRAMUSCULAR | Status: AC
  Filled 2017-12-26: qty 2

## 2017-12-26 MED ORDER — ONDANSETRON HCL 4 MG/2ML IJ SOLN
INTRAMUSCULAR | Status: AC
  Filled 2017-12-26: qty 2

## 2017-12-26 MED ORDER — SUGAMMADEX SODIUM 200 MG/2ML IV SOLN
INTRAVENOUS | Status: AC
  Filled 2017-12-26: qty 2

## 2017-12-26 MED ORDER — ACETAMINOPHEN 325 MG PO TABS: 325.0000 mg | ORAL_TABLET | ORAL | Status: DC | PRN

## 2017-12-26 MED ORDER — PHENYLEPHRINE HCL 10 MG/ML IJ SOLN
INTRAMUSCULAR | Status: AC
  Filled 2017-12-26: qty 1

## 2017-12-26 MED ORDER — SODIUM CHLORIDE 0.9 % IV SOLN
INTRAVENOUS | Status: DC | PRN
  Administered 2017-12-26: .04 ug/kg/min via INTRAVENOUS

## 2017-12-26 MED ORDER — ACETAMINOPHEN 160 MG/5ML PO SOLN
325.0000 mg | ORAL | Status: DC | PRN
  Filled 2017-12-26: qty 20.3

## 2017-12-26 MED ORDER — GABAPENTIN 300 MG PO CAPS
300.0000 mg | ORAL_CAPSULE | Freq: Three times a day (TID) | ORAL | Status: DC
  Administered 2017-12-26 – 2017-12-27 (×2): 300 mg via ORAL
  Filled 2017-12-26 (×2): qty 1

## 2017-12-26 MED ORDER — PROPOFOL 10 MG/ML IV BOLUS
INTRAVENOUS | Status: AC
  Filled 2017-12-26: qty 20

## 2017-12-26 MED ORDER — LABETALOL HCL 5 MG/ML IV SOLN
INTRAVENOUS | Status: DC | PRN
  Administered 2017-12-26: 5 mg via INTRAVENOUS
  Administered 2017-12-26: 10 mg via INTRAVENOUS

## 2017-12-26 MED ORDER — PROPOFOL 10 MG/ML IV BOLUS
INTRAVENOUS | Status: DC | PRN
  Administered 2017-12-26: 100 mg via INTRAVENOUS

## 2017-12-26 MED ORDER — PHENYLEPHRINE HCL 10 MG/ML IJ SOLN
INTRAMUSCULAR | Status: DC | PRN
  Administered 2017-12-26: 100 ug via INTRAVENOUS

## 2017-12-26 MED ORDER — SODIUM CHLORIDE 0.9 % IV SOLN
INTRAVENOUS | Status: DC | PRN
  Administered 2017-12-26: 25 ug/min via INTRAVENOUS

## 2017-12-26 MED ORDER — GLYCOPYRROLATE 0.2 MG/ML IJ SOLN
INTRAMUSCULAR | Status: DC | PRN
  Administered 2017-12-26: 0.2 mg via INTRAVENOUS

## 2017-12-26 MED ORDER — ROCURONIUM BROMIDE 50 MG/5ML IV SOLN
INTRAVENOUS | Status: AC
  Filled 2017-12-26: qty 1

## 2017-12-26 MED ORDER — SODIUM CHLORIDE 0.9 % IV SOLN: 500.0000 mL | Freq: Once | INTRAVENOUS | Status: DC | PRN

## 2017-12-26 MED ORDER — METOPROLOL TARTRATE 5 MG/5ML IV SOLN: 2.0000 mg | INTRAVENOUS | Status: DC | PRN

## 2017-12-26 MED ORDER — SODIUM CHLORIDE (HYPERTONIC) 5 % OP OINT
TOPICAL_OINTMENT | Freq: Every day | OPHTHALMIC | Status: DC
  Administered 2017-12-26: 1 via OPHTHALMIC
  Filled 2017-12-26: qty 3.5

## 2017-12-26 MED ORDER — SODIUM CHLORIDE (HYPERTONIC) 5 % OP SOLN
1.0000 [drp] | Freq: Every day | OPHTHALMIC | Status: DC
  Filled 2017-12-26 (×2): qty 15

## 2017-12-26 MED ORDER — SUGAMMADEX SODIUM 200 MG/2ML IV SOLN
INTRAVENOUS | Status: DC | PRN
  Administered 2017-12-26: 100 mg via INTRAVENOUS

## 2017-12-26 MED ORDER — HEPARIN SODIUM (PORCINE) 1000 UNIT/ML IJ SOLN
INTRAMUSCULAR | Status: DC | PRN
  Administered 2017-12-26: 7000 [IU] via INTRAVENOUS

## 2017-12-26 MED ORDER — BLISTEX MEDICATED EX OINT
TOPICAL_OINTMENT | CUTANEOUS | Status: DC | PRN
  Administered 2017-12-26: 1 via TOPICAL
  Filled 2017-12-26: qty 6.3

## 2017-12-26 MED ORDER — NITROGLYCERIN 0.4 MG SL SUBL: 0.4000 mg | SUBLINGUAL_TABLET | SUBLINGUAL | Status: DC | PRN

## 2017-12-26 MED ORDER — MORPHINE SULFATE (PF) 2 MG/ML IV SOLN: 2.0000 mg | INTRAVENOUS | Status: DC | PRN

## 2017-12-26 MED ORDER — HEPARIN SODIUM (PORCINE) 1000 UNIT/ML IJ SOLN
INTRAMUSCULAR | Status: AC
  Filled 2017-12-26: qty 1

## 2017-12-26 MED ORDER — CEFAZOLIN SODIUM-DEXTROSE 2-4 GM/100ML-% IV SOLN
2.0000 g | Freq: Three times a day (TID) | INTRAVENOUS | Status: AC
  Administered 2017-12-26 – 2017-12-27 (×2): 2 g via INTRAVENOUS
  Filled 2017-12-26 (×2): qty 100

## 2017-12-26 MED ORDER — GUAIFENESIN-DM 100-10 MG/5ML PO SYRP
15.0000 mL | ORAL_SOLUTION | ORAL | Status: DC | PRN
  Filled 2017-12-26: qty 15

## 2017-12-26 MED ORDER — LABETALOL HCL 5 MG/ML IV SOLN: 10.0000 mg | INTRAVENOUS | Status: DC | PRN

## 2017-12-26 MED ORDER — ACETAMINOPHEN 650 MG RE SUPP: 325.0000 mg | RECTAL | Status: DC | PRN

## 2017-12-26 MED ORDER — METOCLOPRAMIDE HCL 5 MG/ML IJ SOLN: 10.0000 mg | Freq: Once | INTRAMUSCULAR | Status: DC | PRN

## 2017-12-26 MED ORDER — VITAMIN D 1000 UNITS PO TABS
5000.0000 [IU] | ORAL_TABLET | Freq: Every day | ORAL | Status: DC
  Administered 2017-12-27: 5000 [IU] via ORAL
  Filled 2017-12-26 (×2): qty 5

## 2017-12-26 MED ORDER — GLYCOPYRROLATE 0.2 MG/ML IJ SOLN
INTRAMUSCULAR | Status: AC
  Filled 2017-12-26: qty 1

## 2017-12-26 SURGICAL SUPPLY — 58 items
BAG DECANTER FOR FLEXI CONT (MISCELLANEOUS) ×3 IMPLANT
BLADE SURG 15 STRL LF DISP TIS (BLADE) ×1 IMPLANT
BLADE SURG 15 STRL SS (BLADE) ×2
BLADE SURG SZ11 CARB STEEL (BLADE) ×3 IMPLANT
BOOT SUTURE AID YELLOW STND (SUTURE) ×6 IMPLANT
BRUSH SCRUB EZ  4% CHG (MISCELLANEOUS) ×2
BRUSH SCRUB EZ 4% CHG (MISCELLANEOUS) ×1 IMPLANT
CANISTER SUCT 1200ML W/VALVE (MISCELLANEOUS) ×3 IMPLANT
DERMABOND ADVANCED (GAUZE/BANDAGES/DRESSINGS) ×2
DERMABOND ADVANCED .7 DNX12 (GAUZE/BANDAGES/DRESSINGS) ×1 IMPLANT
DRAPE INCISE IOBAN 66X45 STRL (DRAPES) ×3 IMPLANT
DRAPE LAPAROTOMY 100X77 ABD (DRAPES) ×3 IMPLANT
DRAPE SHEET LG 3/4 BI-LAMINATE (DRAPES) ×3 IMPLANT
DRESSING SURGICEL FIBRLLR 1X2 (HEMOSTASIS) ×1 IMPLANT
DRSG SURGICEL FIBRILLAR 1X2 (HEMOSTASIS) ×3
DURAPREP 26ML APPLICATOR (WOUND CARE) ×6 IMPLANT
ELECT CAUTERY BLADE 6.4 (BLADE) ×3 IMPLANT
ELECT REM PT RETURN 9FT ADLT (ELECTROSURGICAL) ×3
ELECTRODE REM PT RTRN 9FT ADLT (ELECTROSURGICAL) ×1 IMPLANT
EVICEL AIRLESS SPRAY ACCES (MISCELLANEOUS) ×3 IMPLANT
GLOVE BIO SURGEON STRL SZ7 (GLOVE) ×3 IMPLANT
GLOVE INDICATOR 7.5 STRL GRN (GLOVE) ×3 IMPLANT
GLOVE SURG SYN 8.0 (GLOVE) ×3 IMPLANT
GOWN STRL REUS W/ TWL LRG LVL3 (GOWN DISPOSABLE) ×2 IMPLANT
GOWN STRL REUS W/ TWL XL LVL3 (GOWN DISPOSABLE) ×1 IMPLANT
GOWN STRL REUS W/TWL LRG LVL3 (GOWN DISPOSABLE) ×4
GOWN STRL REUS W/TWL XL LVL3 (GOWN DISPOSABLE) ×2
IV NS 500ML (IV SOLUTION) ×2
IV NS 500ML BAXH (IV SOLUTION) ×1 IMPLANT
KIT TURNOVER KIT A (KITS) ×3 IMPLANT
LABEL OR SOLS (LABEL) ×3 IMPLANT
LOOP RED MAXI  1X406MM (MISCELLANEOUS) ×4
LOOP VESSEL MAXI 1X406 RED (MISCELLANEOUS) ×2 IMPLANT
LOOP VESSEL MINI 0.8X406 BLUE (MISCELLANEOUS) ×1 IMPLANT
LOOPS BLUE MINI 0.8X406MM (MISCELLANEOUS) ×2
NEEDLE FILTER BLUNT 18X 1/2SAF (NEEDLE) ×2
NEEDLE FILTER BLUNT 18X1 1/2 (NEEDLE) ×1 IMPLANT
NEEDLE HYPO 25X1 1.5 SAFETY (NEEDLE) ×3 IMPLANT
NS IRRIG 500ML POUR BTL (IV SOLUTION) ×3 IMPLANT
PACK BASIN MAJOR ARMC (MISCELLANEOUS) ×3 IMPLANT
PATCH CAROTID ECM VASC 1X10 (Prosthesis & Implant Heart) ×3 IMPLANT
SHUNT CAROTID STR REINF 3.0X4. (MISCELLANEOUS) ×3 IMPLANT
SUT MNCRL+ 5-0 UNDYED PC-3 (SUTURE) ×1 IMPLANT
SUT MONOCRYL 5-0 (SUTURE) ×2
SUT PROLENE 6 0 BV (SUTURE) ×24 IMPLANT
SUT PROLENE 7 0 BV 1 (SUTURE) ×18 IMPLANT
SUT SILK 2 0 (SUTURE) ×2
SUT SILK 2-0 18XBRD TIE 12 (SUTURE) ×1 IMPLANT
SUT SILK 3 0 (SUTURE) ×2
SUT SILK 3-0 18XBRD TIE 12 (SUTURE) ×1 IMPLANT
SUT SILK 4 0 (SUTURE) ×2
SUT SILK 4-0 18XBRD TIE 12 (SUTURE) ×1 IMPLANT
SUT VIC AB 3-0 SH 27 (SUTURE) ×2
SUT VIC AB 3-0 SH 27X BRD (SUTURE) ×1 IMPLANT
SYR 10ML LL (SYRINGE) ×3 IMPLANT
SYR 20CC LL (SYRINGE) ×3 IMPLANT
SYR 3ML LL SCALE MARK (SYRINGE) ×3 IMPLANT
TRAY FOLEY MTR SLVR 16FR STAT (SET/KITS/TRAYS/PACK) ×3 IMPLANT

## 2017-12-26 NOTE — Anesthesia Post-op Follow-up Note (Signed)
Anesthesia QCDR form completed.        

## 2017-12-26 NOTE — OR Nursing (Signed)
Called surgeon back to OR 7 to reassess patients incision site.  Surgeon stated swelling was normal and patient good to go to PACU.

## 2017-12-26 NOTE — Anesthesia Postprocedure Evaluation (Signed)
Anesthesia Post Note  Patient: Kathleen Walls  Procedure(s) Performed: ENDARTERECTOMY CAROTID (Right )  Anesthesia Type: General     Last Vitals:  Vitals:   12/26/17 1142 12/26/17 1458  BP: (!) 164/84 (!) 124/59  Pulse: (!) 59 (!) 59  Resp: 20   Temp: 36.6 C   SpO2: 100% 100%    Last Pain:  Vitals:   12/26/17 1210  TempSrc:   PainSc: 0-No pain                 Wess Botts

## 2017-12-26 NOTE — Op Note (Signed)
[facility]   OPERATIVE NOTE  PROCEDURE:   1.  Right carotid endarterectomy with CorMatrix arterial patch reconstruction  PRE-OPERATIVE DIAGNOSIS: 1.  Critical carotid stenosis 2.  Recent TIA  POST-OPERATIVE DIAGNOSIS: same as above   SURGEON: Katha Cabal, MD  ASSISTANT(S): None  ANESTHESIA: general  ESTIMATED BLOOD LOSS: 75 cc  FINDING(S): 1.  Extensive calcified carotid plaque.  SPECIMEN(S):  Carotid plaque (sent to Pathology)  INDICATIONS:   Kathleen Walls is a 77 y.o. y.o. female who presents with right carotid stenosis of 95 %.  The risks, benefits, and alternatives to carotid endarterectomy were discussed with the patient. The differences between carotid stenting and carotid endarterectomy were reviewed.  The patient voiced understanding and appears to be aware that the risks of carotid endarterectomy include but are not limited to: bleeding, infection, stroke, myocardial infarction, death, cranial nerve injuries both temporary and permanent, neck hematoma, possible airway compromise, labile blood pressure post-operatively, cerebral hyperperfusion syndrome, and possible need for additional interventions in the future. The patient is aware of the risks and agrees to proceed forward with the procedure.  DESCRIPTION: After full informed written consent was obtained from the patient, the patient was brought back to the operating room and placed supine upon the operating table.  Prior to induction, the patient received IV antibiotics.  After obtaining adequate anesthesia, the patient was placed a supine position with a shoulder roll in place and the patient's neck slightly hyperextended and rotated away from the surgical site.  The patient was prepped in the standard fashion for a carotid endarterectomy.    The incision was made anterior to the sternocleidomastoid muscle and dissected down through the subcutaneous tissue.  The platysmas was opened with  electrocautery.  The internal jugular vein and facial vein were identified.  The facial vein is ligated and divided between 2-0 silk ties.  The omohyoid was identified in the common carotid artery exposed at this level. The dissection was there in carried out along the carotid artery in a cranial direction.  The dissection was then carried along periadventitial plane along the common carotid artery up to the bifurcation. The external carotid artery was identified. Vessel loops were then placed around the external carotid artery as well as the superior thyroid artery. In the process of this dissection, the hypoglossal nerve was identified and protected from harm.  The internal carotid artery was then dissected circumferentially just beyond an area in the internal carotid artery distal to the plaque.    At this point, we gave the patient 7000 units of intravenous heparin.  After this was allowed to circulate for several minutes, the common carotid followed by the external carotid and then the internal carotid artery were clamped.  Arteriotomy was made in the common carotid artery with a 11 blade, and extended the arteriotomy with a Potts scissor down into the common carotid artery, then the arteriotomy was carried through the bifurcation into the internal carotid artery until I reached an area that was not diseased.  At this point, a Sundt shunt was placed.  The endarterectomy was begun in the common carotid artery with a Garment/textile technologist and carried this dissection down into the common carotid artery circumferentially.  Then I transected the plaque at a segment where it was adherent and transected the plaque with Potts scissors.  I then carried this dissection up into the external carotid artery.  The plaque was extracted by unclamping the external carotid artery and performing an  eversion endarterectomy.  The dissection was then carried into the internal carotid artery where a  feathered end point was  created.  The plaque was passed off the field as a specimen.  The distal endpoint was tacked down with 6 interrupted 7-0 Prolene sutures.  A CorMatrix arterial patch was delivered onto the field and trimmed appropriately for the artery and sewed in place with 6-0 Prolene using a 4 quadrant technique.  The medial suture line was completed and the lateral suture line was run approximately one quarter the length of the arteriotomy.  Prior to completing this patch angioplasty, the shunt was removed, the internal carotid artery was flushed and there was excellent backbleeding.  The carotid artery repair was flushed with heparinized saline and then the patch angioplasty was completed in the usual fashion.  The flow was then reestablished first to the external carotid artery and then the internal carotid artery to prevent distal embolization.   Several minutes of pressure were held and 6-0 Prolene patch sutures were used as need for hemostasis.  At this point, I placed Surgicel and Evicel topical hemostatic agents.  There was no more active bleeding in the surgical site.  The sternocleidomastoid space was closed with three interrupted 3-0 Vicryl sutures. I then reapproximated the platysma muscle with a running stitch of 3-0 Vicryl.  The skin was then closed with a running subcuticular 4-0 Monocryl.  The skin was then cleaned, dried and Dermabond was used to reinforce the skin closure.  The patient awakened and was taken to the recovery room in stable condition, following commands and moving all four extremities without any apparent deficits.    COMPLICATIONS: none  CONDITION: stable  Hortencia Pilar 12/26/2017<4:42 PM

## 2017-12-26 NOTE — H&P (Signed)
[facility] Admission History & Physical  MRN : [id number]  Kathleen Walls is a 77 y.o. (1941-02-16) female who presents with chief complaint of No chief complaint on file. Marland Kitchen  History of Present Illness:   The patient presents to [facility] regional today for treatment of her critical right carotid artery stenosis.  CT angiogram  was done 11/16/2017.   The patient endorses a history of waking up about a month ago unable to get out of bed noting that her left upper extremity was weak. The patient called EMS and was evaluated at [facility] ED. Prior to this occurrence, the patient denies any amaurosis fugax or neurological issues.  The patient denies interval amaurosis fugax. There is no recent or interval TIA symptoms or focal motor deficits. There is no prior documented CVA.  The patient is taking enteric-coated aspirin 81 mg daily and Plavix 75 mg daily.  There is no history of migraine headaches. There is no history of seizures.  The patient has a history of coronary artery disease, no recent episodes of angina or shortness of breath. The patient denies PAD or claudication symptoms. There is a history of hyperlipidemia which is being treated with a statin.   CT angiogram is reviewed by me personally and shows 75-80% stenosis consistent with calcified plaque at the origin of the right  internal carotid artery.  There is 65% LICA stenosis    Current Facility-Administered Medications  Medication Dose Route Frequency Provider Last Rate Last Dose  . ceFAZolin (ANCEF) 2-4 GM/100ML-% IVPB           . ceFAZolin (ANCEF) IVPB 2g/100 mL premix  2 g Intravenous On Call to [place], PA-C      . Chlorhexidine Gluconate Cloth 2 % PADS 6 each  6 each Topical Once Stegmayer, Kimberly A, PA-C       And  . Chlorhexidine Gluconate Cloth 2 % PADS 6 each  6 each Topical Once Stegmayer, Kimberly A, PA-C      . famotidine (PEPCID) 20  MG tablet           . lactated ringers infusion   Intravenous Continuous Gunnar Bulla, MD        Past Medical History:  Diagnosis Date  . Arthritis   . Breast cancer [facility]) 2009    2009 breast cancer stage IIA T2 N0, ER positive, HER-2 negative, 2.5 cm primary tumor, poorly differentiated, status post wide local excision, one sentinel lymph node negative, high-risk Oncotype score 48  . Cataracts, both eyes   . Colitis   . Complication of anesthesia    confusion  . Coronary artery disease   . Detached vitreous humor   . Diverticulitis   . 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 Gastritis   . GERD (gastroesophageal reflux disease)   . 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 Hemorrhoid   . Hemorrhoids   . History of kidney stones   . Hyperlipemia   . 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 Kidney cysts    Left  . Kidney stones   . Lipoma of shoulder 2009   left  . Migraine   . Osteoarthritis   . Osteopenia   . Stented coronary artery   . Stroke [facility])     Past Surgical History:  Procedure Laterality Date  . ABDOMINAL HYSTERECTOMY    . benign breast biopsy  2007  . BREAST BIOPSY Left 2007   positive  . BREAST CYST ASPIRATION Right    neg  . BREAST LUMPECTOMY Left 2009    2009 breast cancer stage IIA T2 N0, ER positive, HER-2 negative, 2.5 cm primary tumor, poorly differentiated, status post wide local excision, one sentinel lymph node negative, high-risk Oncotype score 48  . BREAST SURGERY    . CARDIAC CATHETERIZATION N/A 06/22/2015   Procedure: Left Heart Cath and Coronary Angiography;  Surgeon: Teodoro Spray, MD;  Location: [place] [facility];  Service: Cardiovascular;  Laterality: N/A;  . CARDIAC CATHETERIZATION    . CATARACT EXTRACTION    . COLONOSCOPY    . CORONARY ANGIOPLASTY    . EYE SURGERY    . FOOT FRACTURE SURGERY Left   . PARTIAL HYSTERECTOMY    . UPPER GASTROINTESTINAL ENDOSCOPY      Social History Social  History   Tobacco Use  . Smoking status: Former Smoker    Types: Cigarettes    Last attempt to quit: 08/14/1993    Years since quitting: 24.3  . Smokeless tobacco: Never Used  Substance Use Topics  . Alcohol use: Yes    Alcohol/week: 0.0 oz    Comment: rare  . Drug use: No    Family History Family History  Problem Relation Age of Onset  . Heart disease Mother   . Diabetes Father   . Heart disease Father   . Diabetes Sister   . Diabetes Brother   . Cervical cancer Cousin   . Breast cancer Cousin   . Lung cancer Paternal Aunt   . Prostate cancer Maternal Uncle   . Leukemia Unknown   . Hypertension Unknown   No family history of bleeding/clotting disorders, porphyria or autoimmune disease   Allergies  Allergen Reactions  . Alendronate Sodium Other (See Comments)    Unknown  . Atorvastatin Other (See Comments)    Muscle Pain  . Azithromycin Hives  . Ciprofloxacin Other (See Comments)    Headache  . Colestipol Other (See Comments)    Unknown  . Lidocaine Hives  . Lovastatin Other (See Comments)    Headache  . Metronidazole Other (See Comments)    Headache  . Niacin Other (See Comments)    Unknown  . Risedronate Sodium Other (See Comments)    Unknown  . Rosuvastatin Other (See Comments)    Unknown  . Simvastatin Other (See Comments)    Headache     REVIEW OF SYSTEMS (Negative unless checked)  Constitutional: []Weight loss  []Fever  []Chills Cardiac: []Chest pain   []Chest pressure   []Palpitations   []Shortness of breath when laying flat   []Shortness of breath at rest   []Shortness of breath with exertion. Vascular:  []Pain in legs with walking   []Pain in legs at rest   []Pain in legs when laying flat   []Claudication   []Pain in feet when walking  []Pain in feet at rest  []Pain in feet when laying flat   []History of DVT   []Phlebitis   []Swelling in legs   []Varicose veins   []Non-healing ulcers Pulmonary:   []Uses home oxygen   []Productive cough    []Hemoptysis   []Wheeze  []COPD   []Asthma Neurologic:  [x]Dizziness  []Blackouts   []Seizures   []History of stroke   []History of TIA  []Aphasia   []Temporary blindness   []Dysphagia   [x]Weakness or numbness in arms   [x]Weakness or numbness in legs  Musculoskeletal:  []Arthritis   []Joint swelling   []Joint pain   []Low back pain Hematologic:  []Easy bruising  []Easy bleeding   []Hypercoagulable state   []Anemic  []Hepatitis Gastrointestinal:  []Blood in stool   []Vomiting blood  []Gastroesophageal reflux/heartburn   []Difficulty swallowing. Genitourinary:  []Chronic kidney disease   []Difficult urination  []Frequent urination  []Burning with urination   []Blood in urine Skin:  []Rashes   []Ulcers   []Wounds Psychological:  []History of anxiety   [] History of major depression.  Physical Examination  There were no vitals filed for this visit. There is no height or weight on file to calculate BMI. Gen: WD/WN, NAD Head: [place]/AT, No temporalis wasting. Prominent temp pulse not noted. Ear/Nose/Throat: Hearing grossly intact, nares w/o erythema or drainage, oropharynx w/o Erythema/Exudate,  Eyes: Conjunctiva clear, sclera non-icteric Neck: Trachea midline.  No JVD.  Pulmonary:  Good air movement, respirations not labored, no use of accessory muscles.  Cardiac: RRR, normal S1, S2. Vascular: Positive right carotid bruit Vessel Right Left  Radial Palpable Palpable  Ulnar Palpable Palpable  Brachial Palpable Palpable  Carotid Palpable, with bruit Palpable, without bruit  Gastrointestinal: soft, non-tender/non-distended. No guarding/reflex.  Musculoskeletal: M/S 5/5 throughout.  Extremities without ischemic changes.  No deformity or atrophy.  Neurologic: Sensation grossly intact in extremities.  Symmetrical.  Speech is fluent. Motor exam as listed above. Psychiatric: Judgment intact, Mood & affect appropriate for pt's clinical situation. Dermatologic: No rashes or ulcers noted.  No cellulitis or  open wounds. Lymph : No Cervical, Axillary, or Inguinal lymphadenopathy.     CBC Lab Results  Component Value Date   WBC 7.0 11/16/2017   HGB 14.2 11/16/2017   HCT 44.2 11/16/2017   MCV 80.3 11/16/2017   PLT 237 11/16/2017    BMET    Component Value Date/Time   NA 142 11/16/2017 0806   NA 143 12/31/2013 0406   K 4.5 11/16/2017 0806   K 4.5 12/31/2013 0406   CL 105 11/16/2017 0806   CL 111 (H) 12/31/2013 0406   CO2 31 11/16/2017 0806   CO2 28 12/31/2013 0406   GLUCOSE 107 (H) 11/16/2017 0806   GLUCOSE 115 (H) 12/31/2013 0406   BUN 19 11/16/2017 0806   BUN 10 12/31/2013 0406   CREATININE 0.90 11/16/2017 0806   CREATININE 0.87 12/31/2013 0406   CALCIUM 9.5 11/16/2017 0806   CALCIUM 8.6 12/31/2013 0406   GFRNONAA >60 11/16/2017 0806   GFRNONAA >60 12/31/2013 0406   GFRAA >60 11/16/2017 0806   GFRAA >60 12/31/2013 0406   CrCl cannot be calculated (Patient's most recent lab result is older than the maximum 21 days allowed.).  COAG Lab Results  Component Value Date   INR 0.99 12/14/2017   INR 0.90 11/16/2017    Radiology No results found.  Assessment/Plan  1. 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  However, the patient has now progressed and has a right ICA lesion that is >75%.  Patient's CT angiography of the carotid arteries confirms >75% Right ICA stenosis.  The anatomical considerations support surgery over stenting.  This was discussed in detail with the patient.  The patient does indeed need surgery, therefore, cardiac clearance will be arranged. Once cleared the patient will be scheduled for surgery.  The risks, benefits and alternative therapies were reviewed in detail with the patient.  All questions were answered.  The patient agrees to proceed with surgery of  the right carotid artery.  Continue antiplatelet therapy as prescribed. Continue management of  CAD, HTN and Hyperlipidemia. Healthy heart diet, encouraged exercise at least 4 times per week.    2. TIA (transient ischemic attack) See #1  3. Benign hypertension Continue antihypertensive medications as already ordered, these medications have been reviewed and there are no changes at this time.   4. Mixed hyperlipidemia Continue statin as ordered and reviewed, no changes at this time     Hortencia Pilar, MD    12/26/2017 11:43 AM

## 2017-12-26 NOTE — Anesthesia Procedure Notes (Signed)
Procedure Name: Intubation Date/Time: 12/26/2017 12:34 PM Performed by: Alphonsus Sias, MD Pre-anesthesia Checklist: Patient identified, Suction available, Emergency Drugs available, Patient being monitored and Timeout performed Patient Re-evaluated:Patient Re-evaluated prior to induction Oxygen Delivery Method: Circle system utilized Preoxygenation: Pre-oxygenation with 100% oxygen Induction Type: IV induction Ventilation: Mask ventilation without difficulty Laryngoscope Size: Mac and 3 Grade View: Grade I Tube type: Oral Tube size: 6.5 mm Number of attempts: 1 Airway Equipment and Method: Stylet Placement Confirmation: ETT inserted through vocal cords under direct vision and positive ETCO2 Secured at: 20 cm Tube secured with: Tape Dental Injury: Teeth and Oropharynx as per pre-operative assessment

## 2017-12-26 NOTE — Transfer of Care (Signed)
Immediate Anesthesia Transfer of Care Note  Patient: Kathleen Walls  Procedure(s) Performed: ENDARTERECTOMY CAROTID (Right )  Patient Location: PACU  Anesthesia Type:General  Level of Consciousness: awake and alert   Airway & Oxygen Therapy: Patient Spontanous Breathing  Post-op Assessment: Report given to RN, Post -op Vital signs reviewed and stable and Patient moving all extremities X 4  Post vital signs: Reviewed and stable  Last Vitals:  Vitals Value Taken Time  BP 124/59 12/26/2017  2:58 PM  Temp    Pulse 59 12/26/2017  3:01 PM  Resp 27 12/26/2017  3:01 PM  SpO2 100 % 12/26/2017  3:01 PM  Vitals shown include unvalidated device data.  Last Pain:  Vitals:   12/26/17 1210  TempSrc:   PainSc: 0-No pain         Complications: No apparent anesthesia complications

## 2017-12-26 NOTE — Anesthesia Preprocedure Evaluation (Addendum)
Anesthesia Evaluation  Patient identified by MRN, date of birth, ID band Patient awake    Reviewed: Allergy & Precautions, H&P , NPO status , reviewed documented beta blocker date and time   History of Anesthesia Complications (+) history of anesthetic complications  Airway Mallampati: II  TM Distance: >3 FB Neck ROM: full    Dental  (+) Teeth Intact   Pulmonary former smoker,    Pulmonary exam normal        Cardiovascular hypertension, + CAD, + Cardiac Stents and + Peripheral Vascular Disease  Normal cardiovascular exam  11/17/17 ECHO Study Conclusions  - Left ventricle: Wall thickness was increased in a pattern of mild   LVH. Systolic function was normal. The estimated ejection   fraction was in the range of 60% to 65%. Doppler parameters are   consistent with abnormal left ventricular relaxation (grade 1   diastolic dysfunction).  Impressions:  - No significant valvular regurgitaion - No cardiac source of emboli was indentified. _________________  Normal stress test 11/2017 EF 68%, no ischemia    Neuro/Psych  Headaches, TIA Neuromuscular disease CVA    GI/Hepatic GERD  ,  Endo/Other    Renal/GU Renal disease     Musculoskeletal  (+) Arthritis ,   Abdominal   Peds  Hematology  (+) anemia ,   Anesthesia Other Findings Past Medical History: No date: Arthritis 2009: Breast cancer ([place])     Comment:   2009 breast cancer stage IIA T2 N0, ER positive, HER-2               negative, 2.5 cm primary tumor, poorly differentiated,               status post wide local excision, one sentinel lymph node               negative, high-risk Oncotype score 48 No date: Cataracts, both eyes No date: Colitis No date: Complication of anesthesia     Comment:  confusion No date: Coronary artery disease No date: Detached vitreous humor No date: Diverticulitis No date: Female bladder prolapse No date: Gastritis No  date: GERD (gastroesophageal reflux disease) 06/19/2013: Heart valve problem     Comment:  Leaking No date: Hemorrhoid No date: Hemorrhoids No date: History of kidney stones No date: Hyperlipemia No date: 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[place])     Comment:  ERPR POSITIVE 2.5CM GRADE 3, STAGE 2 No date: Kidney cysts     Comment:  Left No date: Kidney stones 2009: Lipoma of shoulder     Comment:  left No date: Migraine No date: Osteoarthritis No date: Osteopenia No date: Stented coronary artery No date: Stroke [facility])  Past Surgical History: No date: ABDOMINAL HYSTERECTOMY 2007: benign breast biopsy 2007: BREAST BIOPSY; Left     Comment:  positive No date: BREAST CYST ASPIRATION; Right     Comment:  neg 2009: BREAST LUMPECTOMY; Left     Comment:   2009 breast cancer stage IIA T2 N0, ER positive, HER-2               negative, 2.5 cm primary tumor, poorly differentiated,               status post wide local excision, one sentinel lymph node               negative, high-risk Oncotype score 48 No date: BREAST SURGERY 06/22/2015: CARDIAC CATHETERIZATION; N/A     Comment:  Procedure: Left Heart Cath and Coronary  Angiography;                Surgeon: Teodoro Spray, MD;  Location: [place] [facility]               [facility];  Service: Cardiovascular;  Laterality: N/A; No date: CARDIAC CATHETERIZATION No date: CATARACT EXTRACTION No date: COLONOSCOPY No date: CORONARY ANGIOPLASTY No date: EYE SURGERY No date: FOOT FRACTURE SURGERY; Left No date: PARTIAL HYSTERECTOMY No date: UPPER GASTROINTESTINAL ENDOSCOPY     Reproductive/Obstetrics                          Anesthesia Physical Anesthesia Plan  ASA: III  Anesthesia Plan: General ETT   Post-op Pain Management:    Induction:   PONV Risk Score and Plan: 3 and Propofol infusion, Ondansetron and Treatment may vary due to age or medical condition  Airway Management Planned:   Additional Equipment:    Intra-op Plan:   Post-operative Plan:   Informed Consent: I have reviewed the patients History and Physical, chart, labs and discussed the procedure including the risks, benefits and alternatives for the proposed anesthesia with the patient or authorized representative who has indicated his/her understanding and acceptance.   Dental Advisory Given  Plan Discussed with: CRNA  Anesthesia Plan Comments:        Anesthesia Quick Evaluation

## 2017-12-26 NOTE — Anesthesia Procedure Notes (Signed)
Arterial Line Insertion Start/End5/15/2019 1:07 PM Performed by: Jonna Clark, CRNA  Patient location: OR. Preanesthetic checklist: patient identified, IV checked, site marked, risks and benefits discussed, surgical consent, monitors and equipment checked, pre-op evaluation and timeout performed Patient sedated Right, radial was placed Catheter size: 20 G  Attempts: 1 Procedure performed without using ultrasound guided technique. Following insertion, dressing applied. Post procedure assessment: normal  Patient tolerated the procedure well with no immediate complications.

## 2017-12-27 ENCOUNTER — Encounter: Payer: Self-pay | Admitting: Vascular Surgery

## 2017-12-27 LAB — BASIC METABOLIC PANEL
Anion gap: 5 (ref 5–15)
BUN: 14 mg/dL (ref 6–20)
CALCIUM: 8.3 mg/dL — AB (ref 8.9–10.3)
CO2: 25 mmol/L (ref 22–32)
Chloride: 108 mmol/L (ref 101–111)
Creatinine, Ser: 0.75 mg/dL (ref 0.44–1.00)
GFR calc Af Amer: >60 mL/min (ref >60)
GLUCOSE: 181 mg/dL — AB (ref 65–99)
Potassium: 4.1 mmol/L (ref 3.5–5.1)
Sodium: 138 mmol/L (ref 135–145)

## 2017-12-27 LAB — CBC
HCT: 34.1 % — ABNORMAL LOW (ref 35.0–47.0)
Hemoglobin: 11 g/dL — ABNORMAL LOW (ref 12.0–16.0)
MCH: 26 pg (ref 26.0–34.0)
MCHC: 32.4 g/dL (ref 32.0–36.0)
MCV: 80.3 fL (ref 80.0–100.0)
PLATELETS: 225 10*3/uL (ref 150–440)
RBC: 4.25 MIL/uL (ref 3.80–5.20)
RDW: 15.4 % — AB (ref 11.5–14.5)
WBC: 12.5 10*3/uL — AB (ref 3.6–11.0)

## 2017-12-27 MED ORDER — HYDROCODONE-ACETAMINOPHEN 5-325 MG PO TABS: 1.0000 | ORAL_TABLET | Freq: Four times a day (QID) | ORAL | 0 refills | Status: DC | PRN

## 2017-12-27 NOTE — Addendum Note (Signed)
Addendum  created 12/27/17 0659 by Nelda Marseille, CRNA   Sign clinical note

## 2017-12-27 NOTE — Discharge Instructions (Signed)
You may shower as of Saturday. Please keep your incision clean and dry.  No driving while on pain medication or until you follow up in our office for your first post-operative follow up

## 2017-12-27 NOTE — Discharge Summary (Signed)
[facility]    Discharge Summary  Patient ID:  Kathleen Walls MRN: [id number] DOB/AGE: [birth year] 77 y.o.  Admit date: 12/26/2017 Discharge date: 12/27/2017 Date of Surgery: 12/26/2017 Surgeon: Surgeon(s): Schnier, Dolores Lory, MD  Admission Diagnosis: CAROTID ARTERY STENOSIS  Discharge Diagnoses:  CAROTID ARTERY STENOSIS  Secondary Diagnoses: Past Medical History:  Diagnosis Date  . Arthritis   . Breast cancer [facility]) 2009    2009 breast cancer stage IIA T2 N0, ER positive, HER-2 negative, 2.5 cm primary tumor, poorly differentiated, status post wide local excision, one sentinel lymph node negative, high-risk Oncotype score 48  . Cataracts, both eyes   . Colitis   . Complication of anesthesia    confusion  . Coronary artery disease   . Detached vitreous humor   . Diverticulitis   . 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 Gastritis   . GERD (gastroesophageal reflux disease)   . 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 Hemorrhoid   . Hemorrhoids   . History of kidney stones   . Hyperlipemia   . 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 Kidney cysts    Left  . Kidney stones   . Lipoma of shoulder 2009   left  . Migraine   . Osteoarthritis   . Osteopenia   . Stented coronary artery   . Stroke [facility])    Procedure(s): ENDARTERECTOMY CAROTID  Discharged Condition: good  HPI:  The patient is a 77 year old female who presented right carotid stenosis 95%.  On the 19th, the patient underwent a right carotid endarterectomy.  The patient tolerated the procedure and will be transferred to the patient.  Surgery was unremarkable.  During the patient's brief operative stay her diet was advanced, her Foley was removed, pain is controlled with PO pain medication and she was ambulating independently.  Hospital Course:  Kathleen Walls is a 77 y.o. female is S/P Right  Procedure(s): ENDARTERECTOMY CAROTID  Extubated:  POD # 0  Physical exam:  A&Ox3, NAD Neck: Incision with some ecchymosis.  There is minimal swelling.  The incision is intact clean and dry.  No drainage.  Trachea is midline. CV: RRR Pulmonary: CTA Bilaterally Abdomen: Soft, non-tender, non-distended, (+) bowel sounds Extremity: Motor / Neuro intact. Warm, dry, Non-tender  Post-op wounds clean, dry, intact or healing well  Pt. Ambulating, voiding and taking PO diet without difficulty.  Pt pain controlled with PO pain meds.  Labs as below  Complications:none  Consults:  Treatment Team:  Wilhelmina Mcardle, MD  Significant Diagnostic Studies: CBC Lab Results  Component Value Date   WBC 12.5 (H) 12/27/2017   HGB 11.0 (L) 12/27/2017   HCT 34.1 (L) 12/27/2017   MCV 80.3 12/27/2017   PLT 225 12/27/2017   BMET    Component Value Date/Time   NA 138 12/27/2017 0541   NA 143 12/31/2013 0406   K 4.1 12/27/2017 0541   K 4.5 12/31/2013 0406   CL 108 12/27/2017 0541   CL 111 (H) 12/31/2013 0406   CO2 25 12/27/2017 0541   CO2 28 12/31/2013 0406   GLUCOSE 181 (H) 12/27/2017 0541   GLUCOSE 115 (H) 12/31/2013 0406   BUN 14 12/27/2017 0541   BUN 10 12/31/2013 0406   CREATININE 0.75 12/27/2017 0541   CREATININE 0.87 12/31/2013 0406   CALCIUM 8.3 (L) 12/27/2017 0541   CALCIUM 8.6 12/31/2013 0406   GFRNONAA >60 12/27/2017  [place] >60 12/31/2013 0406   GFRAA >60 12/27/2017 0541   GFRAA >60 12/31/2013 0406   COAG Lab Results  Component Value Date   INR 0.99 12/14/2017   INR 0.90 11/16/2017   Disposition:  Discharge to :Home  Allergies as of 12/27/2017      Reactions   Alendronate Sodium Other (See Comments)   Unknown   Atorvastatin Other (See Comments)   Muscle Pain   Azithromycin Hives   Ciprofloxacin Other (See Comments)   Headache   Colestipol Other (See Comments)   Unknown   Lidocaine Hives   Lovastatin Other (See Comments)   Headache   Metronidazole Other (See Comments)   Headache   Niacin Other  (See Comments)   Unknown   Risedronate Sodium Other (See Comments)   Unknown   Rosuvastatin Other (See Comments)   Unknown   Simvastatin Other (See Comments)   Headache      Medication List    STOP taking these medications   clopidogrel 75 MG tablet Commonly known as:  PLAVIX     TAKE these medications   ALPRAZolam 0.25 MG tablet Commonly known as:  XANAX TAKE ONE-HALF TABLET BY MOUTH AT BEDTIME   aspirin EC 81 MG tablet Take 81 mg by mouth daily.   Azelaic Acid 15 % cream Apply 1 application topically daily.   butalbital-acetaminophen-caffeine 50-325-40 MG tablet Commonly known as:  FIORICET, ESGIC Take 1-2 tablets by mouth daily as needed for headache.   donepezil 10 MG tablet Commonly known as:  ARICEPT Take 10 mg by mouth at bedtime.   gabapentin 300 MG capsule Commonly known as:  NEURONTIN TAKE ONE CAPSULE BY MOUTH THREE TIMES DAILY   HYDROcodone-acetaminophen 5-325 MG tablet Commonly known as:  NORCO/VICODIN Take 1 tablet by mouth every 6 (six) hours as needed for moderate pain or severe pain.   metoprolol succinate 25 MG 24 hr tablet Commonly known as:  TOPROL-XL Take 25 mg by mouth daily.   nitroGLYCERIN 0.4 MG SL tablet Commonly known as:  NITROSTAT Place 0.4 mg under the tongue every 5 (five) minutes as needed for chest pain.   omeprazole 20 MG capsule Commonly known as:  PRILOSEC Take 20 mg by mouth 2 (two) times daily before a meal.   sodium chloride 5 % ophthalmic solution Commonly known as:  MURO 128 Place 1 drop into the left eye at bedtime.   STOOL SOFTENER 100 MG capsule Generic drug:  docusate sodium Take 100 mg by mouth daily as needed for mild constipation.   Vitamin D3 5000 units Caps Take 1 capsule by mouth daily.   ZETIA 10 MG tablet Generic drug:  ezetimibe Take 10 mg by mouth daily.      Verbal and written Discharge instructions given to the patient. Wound care per Discharge AVS Follow-up Information    Schnier,  Dolores Lory, MD Follow up in 1 week(s).   Specialties:  Vascular Surgery, Cardiology, Radiology, Vascular Surgery Why:  First Post-Op Check.  Contact information: [place] [address] [phone]          Signed: Sela Hua, PA-C  12/27/2017, 2:18 PM

## 2017-12-27 NOTE — Care Management (Signed)
This presentation was related to previous presentation involving TIA assessment- Carotid stenosis. Patient seen by vascular for extensive calcified carotid plaque receiving right carotid endarterectomy with CorMatrix arterial patch reconstruction on 12/26/17. Patient is form home with her husband. I do not see that patient received or required therapy at discharge or did she need home health services. CM team to follow.

## 2017-12-27 NOTE — Anesthesia Postprocedure Evaluation (Signed)
Anesthesia Post Note  Patient: Kathleen Walls  Procedure(s) Performed: ENDARTERECTOMY CAROTID (Right )  Patient location during evaluation: ICU Anesthesia Type: General Level of consciousness: awake, awake and alert and oriented Pain management: pain level controlled Vital Signs Assessment: post-procedure vital signs reviewed and stable Respiratory status: spontaneous breathing, nonlabored ventilation and respiratory function stable Cardiovascular status: blood pressure returned to baseline Anesthetic complications: no     Last Vitals:  Vitals:   12/27/17 0543 12/27/17 0600  BP: 122/66 136/78  Pulse: 64 63  Resp: (!) 23 16  Temp:    SpO2: 100% 100%    Last Pain:  Vitals:   12/27/17 0425  TempSrc: Oral  PainSc: 0-No pain                 Faithanne Verret,  Adrik Khim R

## 2017-12-28 LAB — SURGICAL PATHOLOGY

## 2017-12-31 ENCOUNTER — Ambulatory Visit (INDEPENDENT_AMBULATORY_CARE_PROVIDER_SITE_OTHER): Payer: Medicare Other | Admitting: Vascular Surgery

## 2017-12-31 ENCOUNTER — Encounter (INDEPENDENT_AMBULATORY_CARE_PROVIDER_SITE_OTHER): Payer: Self-pay | Admitting: Vascular Surgery

## 2017-12-31 VITALS — BP 130/68 | HR 69 | Resp 13 | Ht 66.0 in | Wt 92.0 lb

## 2017-12-31 DIAGNOSIS — I6521 Occlusion and stenosis of right carotid artery: Secondary | ICD-10-CM

## 2018-01-01 ENCOUNTER — Encounter (INDEPENDENT_AMBULATORY_CARE_PROVIDER_SITE_OTHER): Payer: Self-pay | Admitting: Vascular Surgery

## 2018-01-01 NOTE — Progress Notes (Signed)
Patient ID: Kathleen Walls, female   DOB: November 15, 1940, 77 y.o.   MRN: [id number]  Chief Complaint  Patient presents with  . Follow-up    1 week [facility] follow up, carotid stenosis    HPI Kathleen Walls is a 77 y.o. female. The patient is seen for follow up evaluation of carotid stenosis status post right carotid endarterectomy on 12/26/2017.  There were no post operative problems or complications related to the surgery.  The patient denies neck or incisional pain.  She is concerned about the bruising associated with her incision  The patient denies interval amaurosis fugax. There is no recent history of TIA symptoms or focal motor deficits. There is no prior documented CVA.  The patient denies headache.  The patient is taking enteric-coated aspirin 81 mg daily.  The patient has a history of coronary artery disease, no recent episodes of angina or shortness of breath. The patient denies PAD or claudication symptoms. There is a history of hyperlipidemia which is being treated with a statin.     Past Medical History:  Diagnosis Date  . Arthritis   . Breast cancer [facility]) 2009    2009 breast cancer stage IIA T2 N0, ER positive, HER-2 negative, 2.5 cm primary tumor, poorly differentiated, status post wide local excision, one sentinel lymph node negative, high-risk Oncotype score 48  . Cataracts, both eyes   . Colitis   . Complication of anesthesia    confusion  . Coronary artery disease   . Detached vitreous humor   . Diverticulitis   . 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 Gastritis   . GERD (gastroesophageal reflux disease)   . 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 Hemorrhoid   . Hemorrhoids   . History of kidney stones   . Hyperlipemia   . 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 Kidney cysts    Left  . Kidney stones   . Lipoma of shoulder 2009   left  . Migraine   . Osteoarthritis   . Osteopenia   . Stented coronary artery   .  Stroke [facility])     Past Surgical History:  Procedure Laterality Date  . ABDOMINAL HYSTERECTOMY    . benign breast biopsy  2007  . BREAST BIOPSY Left 2007   positive  . BREAST CYST ASPIRATION Right    neg  . BREAST LUMPECTOMY Left 2009    2009 breast cancer stage IIA T2 N0, ER positive, HER-2 negative, 2.5 cm primary tumor, poorly differentiated, status post wide local excision, one sentinel lymph node negative, high-risk Oncotype score 48  . BREAST SURGERY    . CARDIAC CATHETERIZATION N/A 06/22/2015   Procedure: Left Heart Cath and Coronary Angiography;  Surgeon: Teodoro Spray, MD;  Location: [place] [facility];  Service: Cardiovascular;  Laterality: N/A;  . CARDIAC CATHETERIZATION    . CATARACT EXTRACTION    . COLONOSCOPY    . CORONARY ANGIOPLASTY    . ENDARTERECTOMY Right 12/26/2017   Procedure: ENDARTERECTOMY CAROTID;  Surgeon: Katha Cabal, MD;  Location: [facility] ORS;  Service: Vascular;  Laterality: Right;  . EYE SURGERY    . FOOT FRACTURE SURGERY Left   . PARTIAL HYSTERECTOMY    . UPPER GASTROINTESTINAL ENDOSCOPY        Allergies  Allergen Reactions  . Alendronate Sodium Other (See Comments)    Unknown  . Atorvastatin Other (See Comments)  Muscle Pain  . Azithromycin Hives  . Ciprofloxacin Other (See Comments)    Headache  . Colestipol Other (See Comments)    Unknown  . Lidocaine Hives  . Lovastatin Other (See Comments)    Headache  . Metronidazole Other (See Comments)    Headache  . Niacin Other (See Comments)    Unknown  . Risedronate Sodium Other (See Comments)    Unknown  . Rosuvastatin Other (See Comments)    Unknown  . Simvastatin Other (See Comments)    Headache    Current Outpatient Medications  Medication Sig Dispense Refill  . ALPRAZolam (XANAX) 0.25 MG tablet TAKE ONE-HALF TABLET BY MOUTH AT BEDTIME    . aspirin EC 81 MG tablet Take 81 mg by mouth daily.     . Azelaic Acid 15 % cream Apply 1 application topically daily.     .  butalbital-acetaminophen-caffeine (FIORICET, ESGIC) 50-325-40 MG tablet Take 1-2 tablets by mouth daily as needed for headache.    . Cholecalciferol (VITAMIN D3) 5000 units CAPS Take 1 capsule by mouth daily.    Marland Kitchen docusate sodium (STOOL SOFTENER) 100 MG capsule Take 100 mg by mouth daily as needed for mild constipation.     Marland Kitchen donepezil (ARICEPT) 10 MG tablet Take 10 mg by mouth at bedtime.     Marland Kitchen ezetimibe (ZETIA) 10 MG tablet Take 10 mg by mouth daily.     Marland Kitchen gabapentin (NEURONTIN) 300 MG capsule TAKE ONE CAPSULE BY MOUTH THREE TIMES DAILY    . HYDROcodone-acetaminophen (NORCO/VICODIN) 5-325 MG tablet Take 1 tablet by mouth every 6 (six) hours as needed for moderate pain or severe pain. 21 tablet 0  . metoprolol succinate (TOPROL-XL) 25 MG 24 hr tablet Take 25 mg by mouth daily.     . nitroGLYCERIN (NITROSTAT) 0.4 MG SL tablet Place 0.4 mg under the tongue every 5 (five) minutes as needed for chest pain.    Marland Kitchen omeprazole (PRILOSEC) 20 MG capsule Take 20 mg by mouth 2 (two) times daily before a meal.    . sodium chloride (MURO 128) 5 % ophthalmic solution Place 1 drop into the left eye at bedtime.      No current facility-administered medications for this visit.         Physical Exam BP 130/68 (BP Location: Right Arm, Patient Position: Sitting)   Pulse 69   Resp 13   Ht '5\' 6"'$  (1.676 m)   Wt 92 lb (41.7 kg)   BMI 14.85 kg/m  Gen:  WD/WN, NAD Skin: incision C/D/I, there is bruising involving the right neck and submandibular area which extends down onto her chest and to both breasts.  The small skin tear from the tape securing the endotracheal tube is nearly healed.  There is a small area in the center of her sternum that appears consistent with a hematoma which is resolving. Neuro: Intact     Assessment/Plan:  1. Stenosis of right carotid artery Recommend:  The patient is s/p successful right CEA  Continue antiplatelet therapy as prescribed Continue management of CAD, HTN and  Hyperlipidemia Healthy heart diet,  encouraged exercise at least 4 times per week  Follow up in 3 months with duplex ultrasound and physical exam based on the patient's carotid surgery and subcritical contralateral stenosis  - VAS US CAROTID; Future      Hortencia Pilar 01/01/2018, 10:05 AM   This note was created with Dragon medical transcription system.  Any errors from dictation are unintentional.

## 2018-01-25 DIAGNOSIS — L943 Sclerodactyly: Secondary | ICD-10-CM | POA: Insufficient documentation

## 2018-01-25 DIAGNOSIS — M79641 Pain in right hand: Secondary | ICD-10-CM | POA: Insufficient documentation

## 2018-01-25 DIAGNOSIS — I7300 Raynaud's syndrome without gangrene: Secondary | ICD-10-CM | POA: Insufficient documentation

## 2018-04-18 ENCOUNTER — Ambulatory Visit (INDEPENDENT_AMBULATORY_CARE_PROVIDER_SITE_OTHER): Payer: Medicare Other

## 2018-04-18 ENCOUNTER — Ambulatory Visit (INDEPENDENT_AMBULATORY_CARE_PROVIDER_SITE_OTHER): Payer: Medicare Other | Admitting: Vascular Surgery

## 2018-04-18 ENCOUNTER — Encounter (INDEPENDENT_AMBULATORY_CARE_PROVIDER_SITE_OTHER): Payer: Self-pay | Admitting: Vascular Surgery

## 2018-04-18 VITALS — BP 109/59 | HR 54 | Resp 16 | Ht 66.0 in | Wt 99.2 lb

## 2018-04-18 DIAGNOSIS — I6521 Occlusion and stenosis of right carotid artery: Secondary | ICD-10-CM

## 2018-04-18 DIAGNOSIS — I10 Essential (primary) hypertension: Secondary | ICD-10-CM | POA: Diagnosis not present

## 2018-04-18 DIAGNOSIS — I2510 Atherosclerotic heart disease of native coronary artery without angina pectoris: Secondary | ICD-10-CM | POA: Diagnosis not present

## 2018-04-18 DIAGNOSIS — E782 Mixed hyperlipidemia: Secondary | ICD-10-CM

## 2018-04-18 DIAGNOSIS — I6523 Occlusion and stenosis of bilateral carotid arteries: Secondary | ICD-10-CM | POA: Diagnosis not present

## 2018-04-18 NOTE — Progress Notes (Signed)
MRN : [id number]  Kathleen Walls is a 77 y.o. ([birth year]) female who presents with chief complaint of No chief complaint on file. Marland Kitchen  History of Present Illness:   The patient is seen for follow up evaluation of carotid stenosis status post right carotid endarterectomy on 12/26/2017.  There were no post operative problems or complications related to the surgery.  The patient denies neck or incisional pain.  The patient denies interval amaurosis fugax. There is no recent history of TIA symptoms or focal motor deficits. There is no prior documented CVA.  The patient denies headache.  The patient is taking enteric-coated aspirin 81 mg daily.  The patient has a history of coronary artery disease, no recent episodes of angina or shortness of breath. The patient denies PAD or claudication symptoms. There is a history of hyperlipidemia which is being treated with a statin.   Previous CT angiogram was reviewed by me personally and shows 75-80% stenosis consistent with calcified plaque at the origin of the right  internal carotid artery.  There is 09% LICA stenosis    No outpatient medications have been marked as taking for the 04/18/18 encounter (Appointment) with Delana Meyer, Dolores Lory, MD.    Past Medical History:  Diagnosis Date  . Arthritis   . Breast cancer [facility]) 2009    2009 breast cancer stage IIA T2 N0, ER positive, HER-2 negative, 2.5 cm primary tumor, poorly differentiated, status post wide local excision, one sentinel lymph node negative, high-risk Oncotype score 48  . Cataracts, both eyes   . Colitis   . Complication of anesthesia    confusion  . Coronary artery disease   . Detached vitreous humor   . Diverticulitis   . 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 Gastritis   . GERD (gastroesophageal reflux disease)   . 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 Hemorrhoid   . Hemorrhoids   . History of kidney stones   . Hyperlipemia   . 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 Kidney cysts    Left  . Kidney stones   . Lipoma of shoulder 2009   left  . Migraine   . Osteoarthritis   . Osteopenia   . Stented coronary artery   . Stroke [facility])     Past Surgical History:  Procedure Laterality Date  . ABDOMINAL HYSTERECTOMY    . benign breast biopsy  2007  . BREAST BIOPSY Left 2007   positive  . BREAST CYST ASPIRATION Right    neg  . BREAST LUMPECTOMY Left 2009    2009 breast cancer stage IIA T2 N0, ER positive, HER-2 negative, 2.5 cm primary tumor, poorly differentiated, status post wide local excision, one sentinel lymph node negative, high-risk Oncotype score 48  . BREAST SURGERY    . CARDIAC CATHETERIZATION N/A 06/22/2015   Procedure: Left Heart Cath and Coronary Angiography;  Surgeon: Teodoro Spray, MD;  Location: [place] [facility];  Service: Cardiovascular;  Laterality: N/A;  . CARDIAC CATHETERIZATION    . CATARACT EXTRACTION    . COLONOSCOPY    . CORONARY ANGIOPLASTY    . ENDARTERECTOMY Right 12/26/2017   Procedure: ENDARTERECTOMY CAROTID;  Surgeon: Katha Cabal, MD;  Location: [facility] OR[facility];  Service: Vascular;  Laterality: Right;  . EYE SURGERY    . FOOT FRACTURE SURGERY Left   . PARTIAL HYSTERECTOMY    . UPPER GASTROINTESTINAL ENDOSCOPY  Social History Social History   Tobacco Use  . Smoking status: Former Smoker    Types: Cigarettes    Last attempt to quit: 08/14/1993    Years since quitting: 24.6  . Smokeless tobacco: Never Used  Substance Use Topics  . Alcohol use: Yes    Alcohol/week: 0.0 standard drinks    Comment: rare  . Drug use: No    Family History Family History  Problem Relation Age of Onset  . Heart disease Mother   . Diabetes Father   . Heart disease Father   . Diabetes Sister   . Diabetes Brother   . Cervical cancer Cousin   . Breast cancer Cousin   . Lung cancer Paternal Aunt   . Prostate cancer Maternal Uncle   . Leukemia Unknown   . Hypertension Unknown      Allergies  Allergen Reactions  . Alendronate Sodium Other (See Comments)    Unknown  . Atorvastatin Other (See Comments)    Muscle Pain  . Azithromycin Hives  . Ciprofloxacin Other (See Comments)    Headache  . Colestipol Other (See Comments)    Unknown  . Lidocaine Hives  . Lovastatin Other (See Comments)    Headache  . Metronidazole Other (See Comments)    Headache  . Niacin Other (See Comments)    Unknown  . Risedronate Sodium Other (See Comments)    Unknown  . Rosuvastatin Other (See Comments)    Unknown  . Simvastatin Other (See Comments)    Headache     REVIEW OF SYSTEMS (Negative unless checked)  Constitutional: _0 Weight loss  _1 Fever  _2 Chills Cardiac: _3 Chest pain   _4 Chest pressure   _5 Palpitations   _6 Shortness of breath when laying flat   _7 Shortness of breath with exertion. Vascular:  _8 Pain in legs with walking   _9 Pain in legs at rest  _10 History of DVT   _11 Phlebitis   _12 Swelling in legs   _13 Varicose veins   _14 Non-healing ulcers Pulmonary:   _15 Uses home oxygen   _16 Productive cough   _17 Hemoptysis   _18 Wheeze  _19 COPD   _20 Asthma Neurologic:  _21 Dizziness   _22 Seizures   _23 History of stroke   _24 History of TIA  _25 Aphasia   _26 Vissual changes   _27 Weakness or numbness in arm   _28 Weakness or numbness in leg Musculoskeletal:   _29 Joint swelling   _30 Joint pain   _31 Low back pain Hematologic:  _32 Easy bruising  _33 Easy bleeding   _34 Hypercoagulable state   _35 Anemic Gastrointestinal:  _36 Diarrhea   _37 Vomiting  _38 Gastroesophageal reflux/heartburn   _39 Difficulty swallowing. Genitourinary:  _40 Chronic kidney disease   _41 Difficult urination  _42 Frequent urination   _43 Blood in urine Skin:  _44 Rashes   _45 Ulcers  Psychological:  _46 History of anxiety   _47  History of major depression.  Physical Examination  There were no vitals filed for this visit. There is no height or weight on file to calculate BMI. Gen: WD/WN, NAD Head: [place]/AT, No temporalis wasting.  Ear/Nose/Throat: Hearing  grossly intact, nares w/o erythema or drainage Eyes: PER, EOMI, sclera nonicteric.  Neck: Supple, no large masses.   Pulmonary:  Good air movement, no audible wheezing bilaterally, no use of accessory muscles.  Cardiac: RRR, no JVD Vascular: bilateral carotid bruits Vessel Right Left  Radial Palpable Palpable  Ulnar Palpable Palpable  Brachial Palpable Palpable  Carotid Palpable Palpable  Gastrointestinal: Non-distended. No guarding/no peritoneal signs.  Musculoskeletal: M/S 5/5 throughout.  No deformity or atrophy.  Neurologic: CN 2-12 intact. Symmetrical.  Speech is fluent. Motor exam as listed  above. Psychiatric: Judgment intact, Mood & affect appropriate for pt's clinical situation. Dermatologic: No rashes or ulcers noted.  No changes consistent with cellulitis. Lymph : No lichenification or skin changes of chronic lymphedema.  CBC Lab Results  Component Value Date   WBC 12.5 (H) 12/27/2017   HGB 11.0 (L) 12/27/2017   HCT 34.1 (L) 12/27/2017   MCV 80.3 12/27/2017   PLT 225 12/27/2017    BMET    Component Value Date/Time   NA 138 12/27/2017 0541   NA 143 12/31/2013 0406   K 4.1 12/27/2017 0541   K 4.5 12/31/2013 0406   CL 108 12/27/2017 0541   CL 111 (H) 12/31/2013 0406   CO2 25 12/27/2017 0541   CO2 28 12/31/2013 0406   GLUCOSE 181 (H) 12/27/2017 0541   GLUCOSE 115 (H) 12/31/2013 0406   BUN 14 12/27/2017 0541   BUN 10 12/31/2013 0406   CREATININE 0.75 12/27/2017 0541   CREATININE 0.87 12/31/2013 0406   CALCIUM 8.3 (L) 12/27/2017 0541   CALCIUM 8.6 12/31/2013 0406   GFRNONAA >60 12/27/2017 0541   GFRNONAA >60 12/31/2013 0406   GFRAA >60 12/27/2017 0541   GFRAA >60 12/31/2013 0406   CrCl cannot be calculated (Patient's most recent lab result is older than the maximum 21 days allowed.).  COAG Lab Results  Component Value Date   INR 0.99 12/14/2017   INR 0.90 11/16/2017    Radiology No results found.   Assessment/Plan 1. Bilateral carotid artery  stenosis Recommend:  Given the patient's asymptomatic subcritical stenosis no further invasive testing or surgery at this time.  Duplex ultrasound shows <50% stenosis bilaterally.  Continue antiplatelet therapy as prescribed Continue management of CAD, HTN and Hyperlipidemia Healthy heart diet,  encouraged exercise at least 4 times per week  Follow up in 12 months with duplex ultrasound and physical exam   - VAS US CAROTID; Future  2. Benign hypertension Continue antihypertensive medications as already ordered, these medications have been reviewed and there are no changes at this time.   3. Arteriosclerosis of coronary artery Continue cardiac and antihypertensive medications as already ordered and reviewed, no changes at this time.  Continue statin as ordered and reviewed, no changes at this time  Nitrates PRN for chest pain   4. Mixed hyperlipidemia Continue statin as ordered and reviewed, no changes at this time     Hortencia Pilar, MD  04/18/2018 12:39 PM

## 2018-04-21 ENCOUNTER — Encounter (INDEPENDENT_AMBULATORY_CARE_PROVIDER_SITE_OTHER): Payer: Self-pay | Admitting: Vascular Surgery

## 2018-05-09 DIAGNOSIS — M19041 Primary osteoarthritis, right hand: Secondary | ICD-10-CM | POA: Insufficient documentation

## 2018-06-28 DIAGNOSIS — K5909 Other constipation: Secondary | ICD-10-CM | POA: Insufficient documentation

## 2018-07-18 ENCOUNTER — Ambulatory Visit: Payer: Medicare Other | Attending: Sports Medicine

## 2018-07-18 DIAGNOSIS — M25551 Pain in right hip: Secondary | ICD-10-CM | POA: Diagnosis present

## 2018-07-18 DIAGNOSIS — M6281 Muscle weakness (generalized): Secondary | ICD-10-CM

## 2018-07-18 NOTE — Therapy (Signed)
[facility] [address], [place], [address] Phone: [phone]   Fax:  [phone]  Physical Therapy Evaluation  Patient Details  Name: Kathleen Walls MRN: [id number] Date of Birth: 10/22/1940 Referring Provider (PT): Candelaria Stagers MD   Encounter Date: 07/18/2018  PT End of Session - 07/18/18 1658    Visit Number  1    Number of Visits  13    Date for PT Re-Evaluation  08/29/18    Authorization Type  1 / 10    PT Start Time  2376    PT Stop Time  1645    PT Time Calculation (min)  60 min    Activity Tolerance  Patient tolerated treatment well    Behavior During Therapy  [facility] for tasks assessed/performed       Past Medical History:  Diagnosis Date  . Arthritis   . Breast cancer [facility]) 2009    2009 breast cancer stage IIA T2 N0, ER positive, HER-2 negative, 2.5 cm primary tumor, poorly differentiated, status post wide local excision, one sentinel lymph node negative, high-risk Oncotype score 48  . Cataracts, both eyes   . Colitis   . Complication of anesthesia    confusion  . Coronary artery disease   . Detached vitreous humor   . Diverticulitis   . 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 Gastritis   . GERD (gastroesophageal reflux disease)   . 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 Hemorrhoid   . Hemorrhoids   . History of kidney stones   . Hyperlipemia   . 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 Kidney cysts    Left  . Kidney stones   . Lipoma of shoulder 2009   left  . Migraine   . Osteoarthritis   . Osteopenia   . Stented coronary artery   . Stroke [facility])     Past Surgical History:  Procedure Laterality Date  . ABDOMINAL HYSTERECTOMY    . benign breast biopsy  2007  . BREAST BIOPSY Left 2007   positive  . BREAST CYST ASPIRATION Right    neg  . BREAST LUMPECTOMY Left 2009    2009 breast cancer stage IIA T2 N0, ER positive, HER-2 negative, 2.5  cm primary tumor, poorly differentiated, status post wide local excision, one sentinel lymph node negative, high-risk Oncotype score 48  . BREAST SURGERY    . CARDIAC CATHETERIZATION N/A 06/22/2015   Procedure: Left Heart Cath and Coronary Angiography;  Surgeon: Teodoro Spray, MD;  Location: [place] [facility];  Service: Cardiovascular;  Laterality: N/A;  . CARDIAC CATHETERIZATION    . CATARACT EXTRACTION    . COLONOSCOPY    . CORONARY ANGIOPLASTY    . ENDARTERECTOMY Right 12/26/2017   Procedure: ENDARTERECTOMY CAROTID;  Surgeon: Katha Cabal, MD;  Location: [facility];  Service: Vascular;  Laterality: Right;  . EYE SURGERY    . FOOT FRACTURE SURGERY Left   . PARTIAL HYSTERECTOMY    . UPPER GASTROINTESTINAL ENDOSCOPY      There were no vitals filed for this visit.   Subjective Assessment - 07/18/18 1651    Subjective  Patient demonstrates increased R hip pain and spasms after sustaining a fall in late Oct 2019. Patient reports she was going up the stairs and tripped on the way up the stairs. Patient states she went to see an MD and  reports there were no fxs sustained secondary to the fall as indicated by X-Ray. Patient states the pain has been improving, but reports increased pain with prolonged weight bearing positions. Patient also reports increased difficulty balancing most notably in narrow BOS and with walking. Patient states she has not noticed much difficulty desending/asending the stairs. Patient states she would like to decrease her pain and reports the pain is less during non weight positions and in the morning.     Patient is accompained by:  Family member   husband   Pertinent History  Fall on 06/09/2018    Limitations  Standing;Walking    Diagnostic tests  X-Ray : Negative for fracture    Patient Stated Goals  To decrease pain and be able to walk    Currently in Pain?  Yes    Pain Score  3    worst 5/10   Pain Location  Back    Pain Orientation  Right    Pain  Descriptors / Indicators  Sore    Pain Type  Chronic pain    Pain Onset  More than a month ago    Pain Frequency  Intermittent         [facility] PT Assessment - 07/18/18 1647      Assessment   Medical Diagnosis  R hip pain    Referring Provider (PT)  Candelaria Stagers MD    Onset Date/Surgical Date  06/09/18    Hand Dominance  Right    Next MD Visit  unknown    Prior Therapy  yes - hand      Balance Screen   Has the patient fallen in the past 6 months  Yes    How many times?  1    Has the patient had a decrease in activity level because of a fear of falling?   Yes    Is the patient reluctant to leave their home because of a fear of falling?   No      Home Film/video editor residence      Prior Function   Level of Independence  Independent    Vocation  Retired    Biomedical scientist  N/A    Leisure   like to travel with RV, read,  cooking, on tablet       Cognition   Overall Cognitive Status  Within Functional Limits for tasks assessed      Observation/Other Assessments   Observations  slight lateral shift to the L      Sensation   Light Touch  Appears Intact      Functional Tests   Functional tests  Single leg stance;Sit to Stand      Single Leg Stance   Comments  Able to hold for 5 sec; hip strategies for balance      Sit to Stand   Comments  Requires use of UE to perform      ROM / Strength   AROM / PROM / Strength  AROM;Strength      AROM   AROM Assessment Site  Hip;Knee;Ankle;Lumbar    Right/Left Hip  Right;Left    Right Hip Extension  5    Right Hip Flexion  100    Right Hip External Rotation   40    Right Hip Internal Rotation   25    Right Hip ABduction  20    Right Hip ADduction  5    Left Hip Extension  10  Left Hip Flexion  100    Left Hip External Rotation   40    Left Hip Internal Rotation   25    Left Hip ABduction  30    Left Hip ADduction  20    Right/Left Knee  Left;Right    Right Knee Extension  0   Reproduction of  pain in sitting   Right Knee Flexion  120    Left Knee Extension  0    Left Knee Flexion  120    Right/Left Ankle  Left;Right    Right Ankle Dorsiflexion  -5    Left Ankle Dorsiflexion  5    Lumbar Flexion  WNL    Lumbar Extension  50% limited - no pain    Lumbar - Right Side Bend  25% limited - no pain    Lumbar - Left Side Bend  25% limited - no pain    Lumbar - Right Rotation  25% limited - no pain    Lumbar - Left Rotation  25% limited - no pain      Strength   Strength Assessment Site  Knee;Hip;Ankle    Right/Left Hip  Right;Left    Right Hip Flexion  4/5    Right Hip Extension  3+/5    Right Hip External Rotation   3+/5    Right Hip Internal Rotation  4-/5    Right Hip ABduction  3+/5    Right Hip ADduction  3/5    Left Hip Flexion  4+/5    Left Hip Extension  4-/5    Left Hip External Rotation  4-/5    Left Hip Internal Rotation  4-/5    Left Hip ABduction  4-/5    Left Hip ADduction  4/5    Right/Left Knee  Right;Left    Right Knee Flexion  4/5    Right Knee Extension  4/5    Left Knee Flexion  5/5    Left Knee Extension  5/5    Right/Left Ankle  Left;Right    Right Ankle Dorsiflexion  4-/5    Left Ankle Dorsiflexion  4-/5      Palpation   Spinal mobility  Increased pain with P-A pressure along unilateral on the R; central sacrum    Palpation comment  Increased TTP along glute med      Special Tests    Special Tests  Lumbar;Hip Special Tests    Lumbar Tests  Slump Test;FABER test;Straight Leg Raise    Hip Special Tests   Other      FABER test   findings  Negative    Comment  B      Slump test   Findings  Positive    Side  Right    Comment  Reproduction of pain      Straight Leg Raise   Findings  Positive    Side   Right    Comment  Reproduction of pain      other   Comments  Positive FADIR on the L      Ambulation/Gait   Gait Comments  Decreased gait speed, decreased step length increased hip IR B       Objective measurements completed on  examination: See above findings.   TREATMENT Therapeutic Exercise SLUMP in sitting -- x 20  Hip abduction in standing -- x 20  Sit to stands -- x 10 with RTB around knees  Patient demonstrates no increase in pain at the end of the  session    PT Education - 07/18/18 1657    Education Details  Form/technique with exercise; added SLUMP motion, hip abduction in standing, sit to stands    Person(s) Educated  Patient    Methods  Explanation;Demonstration    Comprehension  Verbalized understanding;Returned demonstration          PT Long Term Goals - 07/18/18 1755      PT LONG TERM GOAL #1   Title  Patient will be  independent with HEP to continue benefits of therapy until after discharge    Baseline  Dependent with form and technique    Time  8    Period  Weeks    Status  New    Target Date  09/12/17      PT LONG TERM GOAL #2   Title  Patient will have a worst pain of 1/10 to indicate significant improvement in pain and spasms to better be able to perform walking with less pain    Baseline  5/10 worst pain    Time  8    Period  Weeks    Status  New    Target Date  09/12/17      PT LONG TERM GOAL #3   Title  Patient will be able to perform SLS for over 10 sec to indicate significant improvement with balance and decrease in fall risk.     Baseline  5 sec B    Time  8    Period  Weeks    Status  New    Target Date  09/12/17             Plan - 07/18/18 1719    Clinical Impression Statement  Patient is a 77 yo right hand dominant female presenting with increased pain and spasms along her R hip secondary to a fall in late Oct 2019. Patient demonstrates increased hip dysfunction along her right side with possible sciatic nerve involvement as indicated by positive SLUMP and SLR symptoms and radiating symptoms down the outside of the LE. Patient also demosntrates decreased hip strength and patient will benefit from further skilled therapy to return to prior level of  function.     History and Personal Factors relevant to plan of care:  previous hand issues    Clinical Presentation  Stable    Clinical Presentation due to:  pain is improving    Clinical Decision Making  Moderate    Rehab Potential  Good    Clinical Impairments Affecting Rehab Potential  (+) pain is improving, (-)     PT Frequency  2x / week    PT Duration  6 weeks    PT Treatment/Interventions  Therapeutic activities;Neuromuscular re-education;Therapeutic exercise;Electrical Stimulation;Moist Heat;Cryotherapy;Iontophoresis '4mg'$ /ml Dexamethasone;Manual techniques;Patient/family education;Passive range of motion;Dry needling;Joint Manipulations;Stair training;Gait training    PT Next Visit Plan  progress strengthening and AROM    PT Home Exercise Plan  continue to progress pain and spasms    Consulted and Agree with Plan of Care  Patient       Patient will benefit from skilled therapeutic intervention in order to improve the following deficits and impairments:  Abnormal gait, Pain, Decreased coordination, Increased muscle spasms, Decreased range of motion, Decreased endurance, Decreased strength, Decreased balance, Difficulty walking  Visit Diagnosis: Pain in right hip  Muscle weakness (generalized)     Problem List Patient Active Problem List   Diagnosis Date Noted  . Carotid stenosis 12/26/2017  . TIA (transient ischemic attack) 11/16/2017  .  Acute cerebrovascular accident (CVA) ([place]) 11/16/2017  . Carcinoma of overlapping sites of left breast in female, estrogen receptor positive ([place]) 11/17/2016  . Symptomatic carotid artery stenosis without infarction 09/11/2016  . H/O appendicitis 08/01/2016  . Thyromegaly 12/22/2015  . Chest pain 05/14/2015  . Arteriosclerosis of coronary artery 07/22/2014  . Disorder of mitral valve 01/17/2014  . Absolute anemia 10/23/2013  . Benign hypertension 10/23/2013  . Dermatophytic onychia 10/23/2013  . HLD (hyperlipidemia) 10/23/2013  .  Idiopathic peripheral neuropathy 10/23/2013    Blythe Stanford, PT DPT 07/18/2018, 6:07 PM  [facility] [address], [place], [address] Phone: [phone]   Fax:  [phone]  Name: Kathleen Walls MRN: [id number] Date of Birth: 1941/05/07

## 2018-07-23 ENCOUNTER — Ambulatory Visit: Payer: Medicare Other

## 2018-07-23 DIAGNOSIS — M25551 Pain in right hip: Secondary | ICD-10-CM

## 2018-07-23 DIAGNOSIS — M6281 Muscle weakness (generalized): Secondary | ICD-10-CM

## 2018-07-23 NOTE — Therapy (Signed)
[facility] [address] [place], [address] Phone: [phone]   Fax:  [phone]  Physical Therapy Treatment  Patient Details  Name: Kathleen Walls MRN: [id number] Date of Birth: 01-15-1941 Referring Provider (PT): Candelaria Stagers MD   Encounter Date: 07/23/2018  PT End of Session - 07/23/18 1030    Visit Number  2    Number of Visits  13    Date for PT Re-Evaluation  08/29/18    Authorization Type  2 / 10    PT Start Time  0945    PT Stop Time  1030    PT Time Calculation (min)  45 min    Activity Tolerance  Patient tolerated treatment well    Behavior During Therapy  [facility] for tasks assessed/performed       Past Medical History:  Diagnosis Date  . Arthritis   . Breast cancer [facility]) 2009    2009 breast cancer stage IIA T2 N0, ER positive, HER-2 negative, 2.5 cm primary tumor, poorly differentiated, status post wide local excision, one sentinel lymph node negative, high-risk Oncotype score 48  . Cataracts, both eyes   . Colitis   . Complication of anesthesia    confusion  . Coronary artery disease   . Detached vitreous humor   . Diverticulitis   . 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 Gastritis   . GERD (gastroesophageal reflux disease)   . 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 Hemorrhoid   . Hemorrhoids   . History of kidney stones   . Hyperlipemia   . 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 Kidney cysts    Left  . Kidney stones   . Lipoma of shoulder 2009   left  . Migraine   . Osteoarthritis   . Osteopenia   . Stented coronary artery   . Stroke [facility])     Past Surgical History:  Procedure Laterality Date  . ABDOMINAL HYSTERECTOMY    . benign breast biopsy  2007  . BREAST BIOPSY Left 2007   positive  . BREAST CYST ASPIRATION Right    neg  . BREAST LUMPECTOMY Left 2009    2009 breast cancer stage IIA T2 N0, ER positive, HER-2 negative, 2.5  cm primary tumor, poorly differentiated, status post wide local excision, one sentinel lymph node negative, high-risk Oncotype score 48  . BREAST SURGERY    . CARDIAC CATHETERIZATION N/A 06/22/2015   Procedure: Left Heart Cath and Coronary Angiography;  Surgeon: Teodoro Spray, MD;  Location: [place] [facility];  Service: Cardiovascular;  Laterality: N/A;  . CARDIAC CATHETERIZATION    . CATARACT EXTRACTION    . COLONOSCOPY    . CORONARY ANGIOPLASTY    . ENDARTERECTOMY Right 12/26/2017   Procedure: ENDARTERECTOMY CAROTID;  Surgeon: Katha Cabal, MD;  Location: [facility] ORS;  Service: Vascular;  Laterality: Right;  . EYE SURGERY    . FOOT FRACTURE SURGERY Left   . PARTIAL HYSTERECTOMY    . UPPER GASTROINTESTINAL ENDOSCOPY      There were no vitals filed for this visit.  Subjective Assessment - 07/23/18 0921    Subjective  Patient reports the pain has been better since the previous session. Patient states no pain currently.     Patient is accompained by:  Family member   husband   Pertinent History  Fall on 06/09/2018    Limitations  Standing;Walking    Diagnostic tests  X-Ray : Negative for fracture    Patient Stated Goals  To decrease pain and be able to walk    Currently in Pain?  No/denies    Pain Onset  More than a month ago       TREATMENT Therapeutic Exercise Hip extension in standing - 2 x 10  Hip abduction in standing - 2 x 10 Squats with UE support - 2 x 10  Sit to stands without UE support - x 20  Standing ER with UE support - x 20  Sitting with hip ER with YTB around the toes - x 20  Lumbar hip extension in standing - x 20  Lumbar side bending with rotation - x 20   Patient demonstrates no increase in pain at the end of the session   PT Education - 07/23/18 0932    Education Details  Hip extension in standing, rotation    Person(s) Educated  Patient    Methods  Explanation;Demonstration    Comprehension  Verbalized understanding;Returned demonstration           PT Long Term Goals - 07/18/18 1755      PT LONG TERM GOAL #1   Title  Patient will be  independent with HEP to continue benefits of therapy until after discharge    Baseline  Dependent with form and technique    Time  8    Period  Weeks    Status  New    Target Date  09/12/17      PT LONG TERM GOAL #2   Title  Patient will have a worst pain of 1/10 to indicate significant improvement in pain and spasms to better be able to perform walking with less pain    Baseline  5/10 worst pain    Time  8    Period  Weeks    Status  New    Target Date  09/12/17      PT LONG TERM GOAL #3   Title  Patient will be able to perform SLS for over 10 sec to indicate significant improvement with balance and decrease in fall risk.     Baseline  5 sec B    Time  8    Period  Weeks    Status  New    Target Date  09/12/17            Plan - 07/23/18 1031    Clinical Impression Statement  Patient demonstrates increased symptoms after performing lumbar extension and lumbar side bending to the R side. Patient demosntrates no increase in pain at the end of the session most notably with decrease in radiating pain after performing L rotation with side bending. Patient will benefit from further skilled therapy to return to prior level of function.     Rehab Potential  Good    Clinical Impairments Affecting Rehab Potential  (+) pain is improving, (-)     PT Frequency  2x / week    PT Duration  6 weeks    PT Treatment/Interventions  Therapeutic activities;Neuromuscular re-education;Therapeutic exercise;Electrical Stimulation;Moist Heat;Cryotherapy;Iontophoresis '4mg'$ /ml Dexamethasone;Manual techniques;Patient/family education;Passive range of motion;Dry needling;Joint Manipulations;Stair training;Gait training    PT Next Visit Plan  progress strengthening and AROM    PT Home Exercise Plan  continue to progress pain and spasms    Consulted and Agree with Plan of Care  Patient       Patient will  benefit from skilled therapeutic intervention  in order to improve the following deficits and impairments:  Abnormal gait, Pain, Decreased coordination, Increased muscle spasms, Decreased range of motion, Decreased endurance, Decreased strength, Decreased balance, Difficulty walking  Visit Diagnosis: Muscle weakness (generalized)  Pain in right hip     Problem List Patient Active Problem List   Diagnosis Date Noted  . Carotid stenosis 12/26/2017  . TIA (transient ischemic attack) 11/16/2017  . Acute cerebrovascular accident (CVA) ([place]) 11/16/2017  . Carcinoma of overlapping sites of left breast in female, estrogen receptor positive ([place]) 11/17/2016  . Symptomatic carotid artery stenosis without infarction 09/11/2016  . H/O appendicitis 08/01/2016  . Thyromegaly 12/22/2015  . Chest pain 05/14/2015  . Arteriosclerosis of coronary artery 07/22/2014  . Disorder of mitral valve 01/17/2014  . Absolute anemia 10/23/2013  . Benign hypertension 10/23/2013  . Dermatophytic onychia 10/23/2013  . HLD (hyperlipidemia) 10/23/2013  . Idiopathic peripheral neuropathy 10/23/2013    Blythe Stanford, PT DPT 07/23/2018, 10:37 AM  [place] [facility] [address], [place], [address] Phone: [phone]   Fax:  [phone]  Name: SAIMA MONTERROSO MRN: [id number] Date of Birth: 1940/08/25

## 2018-07-30 ENCOUNTER — Ambulatory Visit: Payer: Medicare Other

## 2018-07-30 DIAGNOSIS — M25551 Pain in right hip: Secondary | ICD-10-CM

## 2018-07-30 DIAGNOSIS — M6281 Muscle weakness (generalized): Secondary | ICD-10-CM

## 2018-07-30 NOTE — Therapy (Signed)
[place] [facility] [address] [place], [address] Phone: [phone]   Fax:  [phone]  Physical Therapy Treatment  Patient Details  Name: Kathleen Walls MRN: [id number] Date of Birth: January 14, 1941 Referring Provider (PT): Candelaria Stagers MD   Encounter Date: 07/30/2018  PT End of Session - 07/30/18 0953    Visit Number  3    Number of Visits  13    Date for PT Re-Evaluation  08/29/18    Authorization Type  3 / 10    PT Start Time  0904    PT Stop Time  0946    PT Time Calculation (min)  42 min    Activity Tolerance  Patient tolerated treatment well    Behavior During Therapy  [facility] for tasks assessed/performed       Past Medical History:  Diagnosis Date  . Arthritis   . Breast cancer [facility]) 2009    2009 breast cancer stage IIA T2 N0, ER positive, HER-2 negative, 2.5 cm primary tumor, poorly differentiated, status post wide local excision, one sentinel lymph node negative, high-risk Oncotype score 48  . Cataracts, both eyes   . Colitis   . Complication of anesthesia    confusion  . Coronary artery disease   . Detached vitreous humor   . Diverticulitis   . 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 Gastritis   . GERD (gastroesophageal reflux disease)   . 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 Hemorrhoid   . Hemorrhoids   . History of kidney stones   . Hyperlipemia   . 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 Kidney cysts    Left  . Kidney stones   . Lipoma of shoulder 2009   left  . Migraine   . Osteoarthritis   . Osteopenia   . Stented coronary artery   . Stroke [facility])     Past Surgical History:  Procedure Laterality Date  . ABDOMINAL HYSTERECTOMY    . benign breast biopsy  2007  . BREAST BIOPSY Left 2007   positive  . BREAST CYST ASPIRATION Right    neg  . BREAST LUMPECTOMY Left 2009    2009 breast cancer stage IIA T2 N0, ER positive, HER-2 negative, 2.5  cm primary tumor, poorly differentiated, status post wide local excision, one sentinel lymph node negative, high-risk Oncotype score 48  . BREAST SURGERY    . CARDIAC CATHETERIZATION N/A 06/22/2015   Procedure: Left Heart Cath and Coronary Angiography;  Surgeon: Teodoro Spray, MD;  Location: [place] [facility];  Service: Cardiovascular;  Laterality: N/A;  . CARDIAC CATHETERIZATION    . CATARACT EXTRACTION    . COLONOSCOPY    . CORONARY ANGIOPLASTY    . ENDARTERECTOMY Right 12/26/2017   Procedure: ENDARTERECTOMY CAROTID;  Surgeon: Katha Cabal, MD;  Location: [facility] ORS;  Service: Vascular;  Laterality: Right;  . EYE SURGERY    . FOOT FRACTURE SURGERY Left   . PARTIAL HYSTERECTOMY    . UPPER GASTROINTESTINAL ENDOSCOPY      There were no vitals filed for this visit.  Subjective Assessment - 07/30/18 0952    Subjective  Patient reports she has not had many difficulties with the hip and reports it has been feeling much better.     Patient is accompained by:  Family member   husband   Pertinent History  Fall on 06/09/2018  Limitations  Standing;Walking    Diagnostic tests  X-Ray : Negative for fracture    Patient Stated Goals  To decrease pain and be able to walk    Currently in Pain?  No/denies    Pain Onset  More than a month ago       TREATMENT Therapeutic Exercise Hip extension in standing - x20 40# Hip abduction in standing - x20 10# Sit to stands without UE support - x 20; x 10 with 5# Step ups onto 8" step - x 20 B with high knee Standing hip ER with RTB - x 20 Side stepping with RTB around knees  Single leg squats in standing with UE support -x 20 Squats with physioball on wall - x 20    Patient demonstrates no increase in pain at the end of the session   PT Education - 07/30/18 0953    Education Details  form/technique with exercise; adding YTB to hip abduction    Person(s) Educated  Patient    Methods  Explanation;Demonstration    Comprehension  Verbalized  understanding;Returned demonstration          PT Long Term Goals - 07/18/18 1755      PT LONG TERM GOAL #1   Title  Patient will be  independent with HEP to continue benefits of therapy until after discharge    Baseline  Dependent with form and technique    Time  8    Period  Weeks    Status  New    Target Date  09/12/17      PT LONG TERM GOAL #2   Title  Patient will have a worst pain of 1/10 to indicate significant improvement in pain and spasms to better be able to perform walking with less pain    Baseline  5/10 worst pain    Time  8    Period  Weeks    Status  New    Target Date  09/12/17      PT LONG TERM GOAL #3   Title  Patient will be able to perform SLS for over 10 sec to indicate significant improvement with balance and decrease in fall risk.     Baseline  5 sec B    Time  8    Period  Weeks    Status  New    Target Date  09/12/17            Plan - 07/30/18 0954    Clinical Impression Statement  Patient demonstrates great improvement with exercises and has had no exaccerbation of sypmtoms since the previous session. Patient able to perform single leg squats with UE support indicating improvement overall. Patient continues to have hip abductor and ER weakness; Patient will benefit from further skilled therapy to return to prior level of function.     Rehab Potential  Good    Clinical Impairments Affecting Rehab Potential  (+) pain is improving, (-)     PT Frequency  2x / week    PT Duration  6 weeks    PT Treatment/Interventions  Therapeutic activities;Neuromuscular re-education;Therapeutic exercise;Electrical Stimulation;Moist Heat;Cryotherapy;Iontophoresis '4mg'$ /ml Dexamethasone;Manual techniques;Patient/family education;Passive range of motion;Dry needling;Joint Manipulations;Stair training;Gait training    PT Next Visit Plan  progress strengthening and AROM    PT Home Exercise Plan  continue to progress pain and spasms    Consulted and Agree with Plan of  Care  Patient       Patient will benefit from skilled therapeutic intervention  in order to improve the following deficits and impairments:  Abnormal gait, Pain, Decreased coordination, Increased muscle spasms, Decreased range of motion, Decreased endurance, Decreased strength, Decreased balance, Difficulty walking  Visit Diagnosis: Muscle weakness (generalized)  Pain in right hip     Problem List Patient Active Problem List   Diagnosis Date Noted  . Carotid stenosis 12/26/2017  . TIA (transient ischemic attack) 11/16/2017  . Acute cerebrovascular accident (CVA) ([place]) 11/16/2017  . Carcinoma of overlapping sites of left breast in female, estrogen receptor positive ([place]) 11/17/2016  . Symptomatic carotid artery stenosis without infarction 09/11/2016  . H/O appendicitis 08/01/2016  . Thyromegaly 12/22/2015  . Chest pain 05/14/2015  . Arteriosclerosis of coronary artery 07/22/2014  . Disorder of mitral valve 01/17/2014  . Absolute anemia 10/23/2013  . Benign hypertension 10/23/2013  . Dermatophytic onychia 10/23/2013  . HLD (hyperlipidemia) 10/23/2013  . Idiopathic peripheral neuropathy 10/23/2013    Blythe Stanford, PT DPT 07/30/2018, 10:00 AM  [place] [facility] [address] [place], [address] Phone: [phone]   Fax:  [phone]  Name: Kathleen Walls MRN: [id number] Date of Birth: 1940/12/21

## 2018-08-21 ENCOUNTER — Ambulatory Visit: Payer: Medicare Other | Attending: Sports Medicine

## 2018-08-26 ENCOUNTER — Ambulatory Visit
Admission: RE | Admit: 2018-08-26 | Discharge: 2018-08-26 | Disposition: A | Discharge status: Home / Self Care | Payer: Medicare Other | Source: Ambulatory Visit | Attending: Obstetrics and Gynecology | Admitting: Obstetrics and Gynecology

## 2018-08-26 ENCOUNTER — Encounter (INDEPENDENT_AMBULATORY_CARE_PROVIDER_SITE_OTHER): Payer: Self-pay

## 2018-08-26 ENCOUNTER — Encounter (INDEPENDENT_AMBULATORY_CARE_PROVIDER_SITE_OTHER): Payer: Medicare Other

## 2018-08-26 ENCOUNTER — Ambulatory Visit (INDEPENDENT_AMBULATORY_CARE_PROVIDER_SITE_OTHER): Payer: Medicare Other | Admitting: Vascular Surgery

## 2018-08-26 DIAGNOSIS — Z1231 Encounter for screening mammogram for malignant neoplasm of breast: Secondary | ICD-10-CM | POA: Insufficient documentation

## 2018-08-26 DIAGNOSIS — Z853 Personal history of malignant neoplasm of breast: Secondary | ICD-10-CM | POA: Insufficient documentation

## 2018-08-28 ENCOUNTER — Ambulatory Visit: Payer: Medicare Other

## 2018-09-11 ENCOUNTER — Ambulatory Visit: Payer: Medicare Other

## 2018-11-19 ENCOUNTER — Other Ambulatory Visit: Payer: Self-pay

## 2018-11-20 ENCOUNTER — Inpatient Hospital Stay (HOSPITAL_BASED_OUTPATIENT_CLINIC_OR_DEPARTMENT_OTHER): Payer: Medicare Other | Admitting: Internal Medicine

## 2018-11-20 ENCOUNTER — Other Ambulatory Visit: Payer: Self-pay

## 2018-11-20 ENCOUNTER — Other Ambulatory Visit: Payer: Self-pay | Admitting: *Deleted

## 2018-11-20 ENCOUNTER — Encounter: Payer: Self-pay | Admitting: Internal Medicine

## 2018-11-20 ENCOUNTER — Inpatient Hospital Stay: Payer: Medicare Other | Attending: Internal Medicine

## 2018-11-20 DIAGNOSIS — C50812 Malignant neoplasm of overlapping sites of left female breast: Secondary | ICD-10-CM

## 2018-11-20 DIAGNOSIS — Z170 Estrogen receptor positive status [ER+]: Secondary | ICD-10-CM

## 2018-11-20 DIAGNOSIS — Z9221 Personal history of antineoplastic chemotherapy: Secondary | ICD-10-CM

## 2018-11-20 LAB — BASIC METABOLIC PANEL
Anion gap: 7 (ref 5–15)
BUN: 16 mg/dL (ref 8–23)
CO2: 30 mmol/L (ref 22–32)
Calcium: 9.5 mg/dL (ref 8.9–10.3)
Chloride: 104 mmol/L (ref 98–111)
Creatinine, Ser: 0.87 mg/dL (ref 0.44–1.00)
GFR calc Af Amer: >60 mL/min (ref >60)
GFR calc non Af Amer: >60 mL/min (ref >60)
Glucose, Bld: 80 mg/dL (ref 70–99)
Potassium: 4.2 mmol/L (ref 3.5–5.1)
Sodium: 141 mmol/L (ref 135–145)

## 2018-11-20 LAB — CBC WITH DIFFERENTIAL/PLATELET
Abs Immature Granulocytes: 0.04 10*3/uL (ref 0.00–0.07)
Basophils Absolute: 0 10*3/uL (ref 0.0–0.1)
Basophils Relative: 1 %
Eosinophils Absolute: 0.2 10*3/uL (ref 0.0–0.5)
Eosinophils Relative: 3 %
HCT: 41.7 % (ref 36.0–46.0)
Hemoglobin: 12.9 g/dL (ref 12.0–15.0)
Immature Granulocytes: 1 %
Lymphocytes Relative: 24 %
Lymphs Abs: 1.8 10*3/uL (ref 0.7–4.0)
MCH: 26.4 pg (ref 26.0–34.0)
MCHC: 30.9 g/dL (ref 30.0–36.0)
MCV: 85.3 fL (ref 80.0–100.0)
Monocytes Absolute: 0.8 10*3/uL (ref 0.1–1.0)
Monocytes Relative: 10 %
Neutro Abs: 4.7 10*3/uL (ref 1.7–7.7)
Neutrophils Relative %: 61 %
Platelets: 227 10*3/uL (ref 150–400)
RBC: 4.89 MIL/uL (ref 3.87–5.11)
RDW: 15.2 % (ref 11.5–15.5)
WBC: 7.6 10*3/uL (ref 4.0–10.5)
nRBC: 0 % (ref 0.0–0.2)

## 2018-11-20 NOTE — Progress Notes (Signed)
I connected with Kathleen Walls @ on 11/20/18 at  2:45 PM EDTby telephone and verified that I am speaking with the patient using 2 identifiers.  # LOCATION:  Patient:home  Provider: office  I discussed the limitations, risks, security and privacy concerns of performing an evaluation and management service by telephone and the availability of in person appointments.  I also discussed with the patient that there may be a patient responsible charge related to the service.  The patient expressed understanding and agrees to proceed.  History of present illness:Kathleen Walls 78 y.o.  female with history of stage II breast cancer ER PR positive currently on surveillance.  Patient denies any recent hospitalizations.  Denies any new lumps or bumps.  Denies any bone pain headaches or shortness with cough.   Observation/objective: Mammogram March 2020-within normal limits.  Assessment and plan: Carcinoma of overlapping sites of left breast in female, estrogen receptor positive ([place]) # Breast cancer- stage II ER/PR positive HER-2/neu negative high risk Oncotype status post chemotherapy [2009]; adjuvant hormone therapy until 2012.   #Clinically node is of recurrence/mammogram January 2020 within normal limits.  #'Acute stroke-[[facility] 2019]-stable  # Recommend follow-up in one year; mammogram will be ordered through her PCP [Dr.Sparks]. Prefers to keep follow up with [place].   #DISPOSIITION: # follow up in 12 months-MD/cbc/cmp-Dr.B    Follow-up instructions:  I discussed the assessment and treatment plan with the patient.  The patient was provided an opportunity to ask questions and all were answered.  The patient agreed with the plan and demonstrated understanding of instructions.  The patient was advised to call back or seek an in person evaluation if the symptoms worsen or if the condition fails to improve as anticipated.  I provided 12 minutes of non-face-to-face time during this  encounter   Dr. Charlaine Dalton [facility] 11/20/2018 12:43 PM

## 2018-11-20 NOTE — Assessment & Plan Note (Addendum)
#  Breast cancer- stage II ER/PR positive HER-2/neu negative high risk Oncotype status post chemotherapy [2009]; adjuvant hormone therapy until 2012.   #Clinically node is of recurrence/mammogram January 2020 within normal limits.  #'Acute stroke-[[facility] 2019]-stable  # Recommend follow-up in one year; mammogram will be ordered through her PCP [Dr.Sparks]. Prefers to keep follow up with [place].   #DISPOSIITION: # follow up in 12 months-MD/cbc/cmp-Dr.B

## 2018-12-02 ENCOUNTER — Telehealth: Payer: Self-pay | Admitting: Obstetrics and Gynecology

## 2018-12-02 NOTE — Telephone Encounter (Signed)
Pt called to check on appointment status. I informed pt we lvm for pt to call office and reschedule apt. I let the patient that we could reschedule for 8+ weeks out now, or she had an option to call back to reschedule. Pt was upset that we could not call her back after covid. I apologized and informed the patient we have a large volume of patients to reschedule we have the option to reschedule now or she may call us back at a later date to reschedule. Thank you.

## 2018-12-03 ENCOUNTER — Encounter: Payer: Medicare Other | Admitting: Obstetrics and Gynecology

## 2019-02-18 ENCOUNTER — Telehealth: Payer: Self-pay

## 2019-02-18 NOTE — Patient Instructions (Addendum)
Health Maintenance for Postmenopausal Women Menopause is a normal process in which your ability to get pregnant comes to an end. This process happens slowly over many months or years, usually between the ages of 48 and 55. Menopause is complete when you have missed your menstrual periods for 12 months. It is important to talk with your health care provider about some of the most common conditions that affect women after menopause (postmenopausal women). These include heart disease, cancer, and bone loss (osteoporosis). Adopting a healthy lifestyle and getting preventive care can help to promote your health and wellness. The actions you take can also lower your chances of developing some of these common conditions. What should I know about menopause? During menopause, you may get a number of symptoms, such as:  Hot flashes. These can be moderate or severe.  Night sweats.  Decrease in sex drive.  Mood swings.  Headaches.  Tiredness.  Irritability.  Memory problems.  Insomnia. Choosing to treat or not to treat these symptoms is a decision that you make with your health care provider. Do I need hormone replacement therapy?  Hormone replacement therapy is effective in treating symptoms that are caused by menopause, such as hot flashes and night sweats.  Hormone replacement carries certain risks, especially as you become older. If you are thinking about using estrogen or estrogen with progestin, discuss the benefits and risks with your health care provider. What is my risk for heart disease and stroke? The risk of heart disease, heart attack, and stroke increases as you age. One of the causes may be a change in the body's hormones during menopause. This can affect how your body uses dietary fats, triglycerides, and cholesterol. Heart attack and stroke are medical emergencies. There are many things that you can do to help prevent heart disease and stroke. Watch your blood pressure  High  blood pressure causes heart disease and increases the risk of stroke. This is more likely to develop in people who have high blood pressure readings, are of African descent, or are overweight.  Have your blood pressure checked: ? Every 3-5 years if you are 18-39 years of age. ? Every year if you are 40 years old or older. Eat a healthy diet   Eat a diet that includes plenty of vegetables, fruits, low-fat dairy products, and lean protein.  Do not eat a lot of foods that are high in solid fats, added sugars, or sodium. Get regular exercise Get regular exercise. This is one of the most important things you can do for your health. Most adults should:  Try to exercise for at least 150 minutes each week. The exercise should increase your heart rate and make you sweat (moderate-intensity exercise).  Try to do strengthening exercises at least twice each week. Do these in addition to the moderate-intensity exercise.  Spend less time sitting. Even light physical activity can be beneficial. Other tips  Work with your health care provider to achieve or maintain a healthy weight.  Do not use any products that contain nicotine or tobacco, such as cigarettes, e-cigarettes, and chewing tobacco. If you need help quitting, ask your health care provider.  Know your numbers. Ask your health care provider to check your cholesterol and your blood sugar (glucose). Continue to have your blood tested as directed by your health care provider. Do I need screening for cancer? Depending on your health history and family history, you may need to have cancer screening at different stages of your life. This   may include screening for:  Breast cancer.  Cervical cancer.  Lung cancer.  Colorectal cancer. What is my risk for osteoporosis? After menopause, you may be at increased risk for osteoporosis. Osteoporosis is a condition in which bone destruction happens more quickly than new bone creation. To help prevent  osteoporosis or the bone fractures that can happen because of osteoporosis, you may take the following actions:  If you are 19-50 years old, get at least 1,000 mg of calcium and at least 600 mg of vitamin D per day.  If you are older than age 50 but younger than age 70, get at least 1,200 mg of calcium and at least 600 mg of vitamin D per day.  If you are older than age 70, get at least 1,200 mg of calcium and at least 800 mg of vitamin D per day. Smoking and drinking excessive alcohol increase the risk of osteoporosis. Eat foods that are rich in calcium and vitamin D, and do weight-bearing exercises several times each week as directed by your health care provider. How does menopause affect my mental health? Depression may occur at any age, but it is more common as you become older. Common symptoms of depression include:  Low or sad mood.  Changes in sleep patterns.  Changes in appetite or eating patterns.  Feeling an overall lack of motivation or enjoyment of activities that you previously enjoyed.  Frequent crying spells. Talk with your health care provider if you think that you are experiencing depression. General instructions See your health care provider for regular wellness exams and vaccines. This may include:  Scheduling regular health, dental, and eye exams.  Getting and maintaining your vaccines. These include: ? Influenza vaccine. Get this vaccine each year before the flu season begins. ? Pneumonia vaccine. ? Shingles vaccine. ? Tetanus, diphtheria, and pertussis (Tdap) booster vaccine. Your health care provider may also recommend other immunizations. Tell your health care provider if you have ever been abused or do not feel safe at home. Summary  Menopause is a normal process in which your ability to get pregnant comes to an end.  This condition causes hot flashes, night sweats, decreased interest in sex, mood swings, headaches, or lack of sleep.  Treatment for this  condition may include hormone replacement therapy.  Take actions to keep yourself healthy, including exercising regularly, eating a healthy diet, watching your weight, and checking your blood pressure and blood sugar levels.  Get screened for cancer and depression. Make sure that you are up to date with all your vaccines. This information is not intended to replace advice given to you by your health care provider. Make sure you discuss any questions you have with your health care provider. Document Released: 09/22/2005 Document Revised: 07/24/2018 Document Reviewed: 07/24/2018 Elsevier Patient Education  2020 Elsevier Inc.   Breast Self-Awareness Breast self-awareness is knowing how your breasts look and feel. Doing breast self-awareness is important. It allows you to catch a breast problem early while it is still small and can be treated. All women should do breast self-awareness, including women who have had breast implants. Tell your doctor if you notice a change in your breasts. What you need:  A mirror.  A well-lit room. How to do a breast self-exam A breast self-exam is one way to learn what is normal for your breasts and to check for changes. To do a breast self-exam: Look for changes  1. Take off all the clothes above your waist. 2. Stand in   front of a mirror in a room with good lighting. 3. Put your hands on your hips. 4. Push your hands down. 5. Look at your breasts and nipples in the mirror to see if one breast or nipple looks different from the other. Check to see if: ? The shape of one breast is different. ? The size of one breast is different. ? There are wrinkles, dips, and bumps in one breast and not the other. 6. Look at each breast for changes in the skin, such as: ? Redness. ? Scaly areas. 7. Look for changes in your nipples, such as: ? Liquid around the nipples. ? Bleeding. ? Dimpling. ? Redness. ? A change in where the nipples are. Feel for  changes  1. Lie on your back on the floor. 2. Feel each breast. To do this, follow these steps: ? Pick a breast to feel. ? Put the arm closest to that breast above your head. ? Use your other arm to feel the nipple area of your breast. Feel the area with the pads of your three middle fingers by making small circles with your fingers. For the first circle, press lightly. For the second circle, press harder. For the third circle, press even harder. ? Keep making circles with your fingers at the different pressures as you move down your breast. Stop when you feel your ribs. ? Move your fingers a little toward the center of your body. ? Start making circles with your fingers again, this time going up until you reach your collarbone. ? Keep making up-and-down circles until you reach your armpit. Remember to keep using the three pressures. ? Feel the other breast in the same way. 3. Sit or stand in the tub or shower. 4. With soapy water on your skin, feel each breast the same way you did in step 2 when you were lying on the floor. Write down what you find Writing down what you find can help you remember what to tell your doctor. Write down:  What is normal for each breast.  Any changes you find in each breast, including: ? The kind of changes you find. ? Whether you have pain. ? Size and location of any lumps.  When you last had your menstrual period. General tips  Check your breasts every month.  If you are breastfeeding, the best time to check your breasts is after you feed your baby or after you use a breast pump.  If you get menstrual periods, the best time to check your breasts is 5-7 days after your menstrual period is over.  With time, you will become comfortable with the self-exam, and you will begin to know if there are changes in your breasts. Contact a doctor if you:  See a change in the shape or size of your breasts or nipples.  See a change in the skin of your breast or  nipples, such as red or scaly skin.  Have fluid coming from your nipples that is not normal.  Find a lump or thick area that was not there before.  Have pain in your breasts.  Have any concerns about your breast health. Summary  Breast self-awareness includes looking for changes in your breasts, as well as feeling for changes within your breasts.  Breast self-awareness should be done in front of a mirror in a well-lit room.  You should check your breasts every month. If you get menstrual periods, the best time to check your breasts is 5-7 days   after your menstrual period is over.  Let your doctor know of any changes you see in your breasts, including changes in size, changes on the skin, pain or tenderness, or fluid from your nipples that is not normal. This information is not intended to replace advice given to you by your health care provider. Make sure you discuss any questions you have with your health care provider. Document Released: 01/17/2008 Document Revised: 03/19/2018 Document Reviewed: 03/19/2018 Elsevier Patient Education  2020 Elsevier Inc.  

## 2019-02-18 NOTE — Progress Notes (Signed)
Pt present for annual exam. Pt stated that she is doing well.

## 2019-02-18 NOTE — Telephone Encounter (Signed)
Pt called no answer LM via voicemail to call the office for prescreening. 

## 2019-02-19 ENCOUNTER — Ambulatory Visit: Payer: Medicare Other | Admitting: Obstetrics and Gynecology

## 2019-02-19 ENCOUNTER — Encounter: Payer: Self-pay | Admitting: Obstetrics and Gynecology

## 2019-02-19 ENCOUNTER — Other Ambulatory Visit: Payer: Self-pay

## 2019-02-19 VITALS — BP 109/55 | HR 52 | Ht 66.0 in | Wt 94.9 lb

## 2019-02-19 DIAGNOSIS — Z01419 Encounter for gynecological examination (general) (routine) without abnormal findings: Secondary | ICD-10-CM

## 2019-02-19 DIAGNOSIS — Z853 Personal history of malignant neoplasm of breast: Secondary | ICD-10-CM

## 2019-02-19 DIAGNOSIS — N819 Female genital prolapse, unspecified: Secondary | ICD-10-CM

## 2019-02-19 DIAGNOSIS — F0280 Dementia in other diseases classified elsewhere without behavioral disturbance: Secondary | ICD-10-CM

## 2019-02-19 DIAGNOSIS — N952 Postmenopausal atrophic vaginitis: Secondary | ICD-10-CM

## 2019-02-19 DIAGNOSIS — R636 Underweight: Secondary | ICD-10-CM

## 2019-02-19 DIAGNOSIS — G301 Alzheimer's disease with late onset: Secondary | ICD-10-CM

## 2019-02-19 NOTE — Progress Notes (Signed)
ANNUAL PREVENTATIVE CARE GYNECOLOGY  ENCOUNTER NOTE  Subjective:       Kathleen Walls is a 78 y.o. [id number] female with a h/o breast cancer and mild Alzheimer's here for a routine annual gynecologic exam. The patient denies any complaints today. The patient is sexually active. The patient is not taking hormone replacement therapy. Patient denies post-menopausal vaginal bleeding. The patient wears seatbelts: yes. The patient participates in regular exercise: no. Has the patient ever been transfused or tattooed?: no. The patient reports that there is not domestic violence in her life.    Gynecologic History No LMP recorded. Patient has had a hysterectomy. Contraception: status post hysterectomy Last Pap: 08/2012, normal.  Last mammogram: 08/2018. Results were: BIRADS 1-Negative Last Colonoscopy: 2009. Diverticulosis in right sigmoid colon found.  Last Dexa Scan: 5 years ago   Obstetric History OB History  Gravida Para Term Preterm AB Living  '2 2 2        '$ SAB TAB Ectopic Multiple Live Births               # Outcome Date GA Lbr Len/2nd Weight Sex Delivery Anes PTL Lv  2 Term 1966 [redacted]w[redacted]d  M Vag-Spont     1 Term [id number] M Vag-Spont       Past Medical History:  Diagnosis Date  . Arthritis   [facility]    2009 breast cancer stage IIA T2 N0, ER positive, HER-2 negative, 2.5 cm primary tumor, poorly differentiated, status post wide local excision, one sentinel lymph node negative, high-risk Oncotype score 48  . Cataracts, both eyes   . Colitis   . Complication of anesthesia    confusion  . Coronary artery disease   . Detached vitreous humor   . Diverticulitis   . 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 Gastritis   . GERD (gastroesophageal reflux disease)   . 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 Hemorrhoid   . Hemorrhoids   . History of kidney stones   . Hyperlipemia   . 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  Kidney cysts    Left  . Kidney stones   . Lipoma of shoulder 2009   left  . Migraine   . Osteoarthritis   . Osteopenia   . Stented coronary artery   [facility]    Family History  Problem Relation Age of Onset  . Heart disease Mother   . Diabetes Father   . Heart disease Father   . Diabetes Sister   . Diabetes Brother   . Cervical cancer Cousin   . Breast cancer Cousin   . Lung cancer Paternal Aunt   . Prostate cancer Maternal Uncle   . Leukemia Other   . Hypertension Other     Past Surgical History:  Procedure Laterality Date  . ABDOMINAL HYSTERECTOMY    . benign breast biopsy  2007  . BREAST BIOPSY Left 2007   positive  . BREAST CYST ASPIRATION Right    neg  . BREAST LUMPECTOMY Left 2009    2009 breast cancer stage IIA T2 N0, ER positive, HER-2 negative, 2.5 cm primary tumor, poorly differentiated, status post wide local excision, one sentinel lymph node negative, high-risk Oncotype score 48  . BREAST SURGERY    . CARDIAC CATHETERIZATION N/A 06/22/2015   Procedure: Left Heart Cath and Coronary Angiography;  Surgeon: KeJavier Docker  Ubaldo Glassing, MD;  Location: [facility];  Service: Cardiovascular;  Laterality: N/A;  . CARDIAC CATHETERIZATION    . CATARACT EXTRACTION    . COLONOSCOPY    . CORONARY ANGIOPLASTY    . ENDARTERECTOMY Right 12/26/2017   Procedure: ENDARTERECTOMY CAROTID;  Surgeon: Katha Cabal, MD;  Location: [facility];  Service: Vascular;  Laterality: Right;  . EYE SURGERY    . FOOT FRACTURE SURGERY Left   . PARTIAL HYSTERECTOMY    . UPPER GASTROINTESTINAL ENDOSCOPY      Social History   Socioeconomic History  . Marital status: Married    Spouse name: Not on file  . Number of children: Not on file  . Years of education: Not on file  . Highest education level: Not on file  Occupational History  . Not on file  Social Needs  . Financial resource strain: Not on file  . Food insecurity    Worry: Not on file    Inability: Not on file  .  Transportation needs    Medical: Not on file    Non-medical: Not on file  Tobacco Use  . Smoking status: Former Smoker    Types: Cigarettes    Quit date: 08/14/1993    Years since quitting: 25.5  . Smokeless tobacco: Never Used  Substance and Sexual Activity  . Alcohol use: Not Currently    Alcohol/week: 0.0 standard drinks    Comment: rare  . Drug use: No  . Sexual activity: Yes    Birth control/protection: None, Post-menopausal  Lifestyle  . Physical activity    Days per week: Not on file    Minutes per session: Not on file  . Stress: Not on file  Relationships  . Social Herbalist on phone: Not on file    Gets together: Not on file    Attends religious service: Not on file    Active member of club or organization: Not on file    Attends meetings of clubs or organizations: Not on file    Relationship status: Not on file  . Intimate partner violence    Fear of current or ex partner: Not on file    Emotionally abused: Not on file    Physically abused: Not on file    Forced sexual activity: Not on file  Other Topics Concern  . Not on file  Social History Narrative  . Not on file    Current Outpatient Medications on File Prior to Visit  Medication Sig Dispense Refill  . ALPRAZolam (XANAX) 0.25 MG tablet TAKE ONE-HALF TABLET BY MOUTH AT BEDTIME    . amLODipine (NORVASC) 2.5 MG tablet Take by mouth.    Marland Kitchen aspirin EC 81 MG tablet Take 81 mg by mouth daily.     . Azelaic Acid 15 % cream Apply 1 application topically daily.     . butalbital-acetaminophen-caffeine (FIORICET, ESGIC) 50-325-40 MG tablet Take 1-2 tablets by mouth daily as needed for headache.    . calcium carbonate (TUMS - DOSED IN MG ELEMENTAL CALCIUM) 500 MG chewable tablet Chew 1 tablet by mouth daily.    . Cholecalciferol (VITAMIN D3) 5000 units CAPS Take 1 capsule by mouth daily.     Marland Kitchen docusate sodium (STOOL SOFTENER) 100 MG capsule Take 100 mg by mouth daily as needed for mild constipation.     Marland Kitchen  donepezil (ARICEPT) 10 MG tablet Take 10 mg by mouth at bedtime.     Marland Kitchen ezetimibe (ZETIA) 10 MG  tablet Take 10 mg by mouth daily.     Marland Kitchen gabapentin (NEURONTIN) 300 MG capsule TAKE ONE CAPSULE BY MOUTH THREE TIMES DAILY    . metoprolol succinate (TOPROL-XL) 25 MG 24 hr tablet Take 25 mg by mouth daily.     . nitroGLYCERIN (NITROSTAT) 0.4 MG SL tablet Place 0.4 mg under the tongue every 5 (five) minutes as needed for chest pain.    Marland Kitchen omeprazole (PRILOSEC) 20 MG capsule Take 20 mg by mouth 2 (two) times daily before a meal.    . sodium chloride (MURO 128) 5 % ophthalmic solution Place 1 drop into the left eye at bedtime.      No current facility-administered medications on file prior to visit.     Allergies  Allergen Reactions  . Alendronate Sodium Other (See Comments)    Unknown  . Atorvastatin Other (See Comments)    Muscle Pain  . Azithromycin Hives  . Ciprofloxacin Other (See Comments)    Headache  . Colestipol Other (See Comments)    Unknown  . Lidocaine Hives  . Lovastatin Other (See Comments)    Headache  . Metronidazole Other (See Comments)    Headache  . Niacin Other (See Comments)    Unknown  . Risedronate Sodium Other (See Comments)    Unknown  . Rosuvastatin Other (See Comments)    Unknown  . Simvastatin Other (See Comments)    Headache     Review of Systems ROS Review of Systems - General ROS: negative for - chills, fatigue, fever, hot flashes, night sweats, weight gain or weight loss Psychological ROS: negative for - anxiety, decreased libido, depression, mood swings, physical abuse or sexual abuse Ophthalmic ROS: negative for - blurry vision, eye pain or loss of vision ENT ROS: negative for - headaches, hearing change, visual changes or vocal changes Allergy and Immunology ROS: negative for - hives, itchy/watery eyes or seasonal allergies Hematological and Lymphatic ROS: negative for - bleeding problems, bruising, swollen lymph nodes or weight loss Endocrine  ROS: negative for - galactorrhea, hair pattern changes, hot flashes, malaise/lethargy, mood swings, palpitations, polydipsia/polyuria, skin changes, temperature intolerance or unexpected weight changes Breast ROS: negative for - new or changing breast lumps or nipple discharge Respiratory ROS: negative for - cough or shortness of breath Cardiovascular ROS: negative for - chest pain, irregular heartbeat, palpitations or shortness of breath Gastrointestinal ROS:  Negative for - abdominal pain, change in bowel habits, or black or bloody stools Genito-Urinary ROS: no dysuria, trouble voiding, or hematuria Musculoskeletal ROS: negative for - joint pain or joint stiffness.   Neurological ROS: negative for - bowel and bladder control changes Dermatological ROS: negative for rash and skin lesion changes   Objective:   BP (!) 109/55   Pulse (!) 52   Ht '5\' 6"'$  (1.676 m)   Wt 94 lb 14.4 oz (43 kg)   BMI 15.32 kg/m  CONSTITUTIONAL: Well-developed, well-nourished female in no acute distress.  PSYCHIATRIC: Normal mood and affect. Normal behavior. Normal judgment and thought content. [place]: Alert and oriented to person, place, and time. Normal muscle tone coordination. No cranial nerve deficit noted. HENT:  Normocephalic, atraumatic, External right and left ear normal. Oropharynx is clear and moist EYES: Conjunctivae and EOM are normal. Pupils are equal, round, and reactive to light. No scleral icterus.  NECK: Normal range of motion, supple, no masses.  Normal thyroid.  SKIN: Skin is warm and dry. No rash noted. Not diaphoretic. No erythema. No pallor. CARDIOVASCULAR: Normal heart rate  noted, regular rhythm, no murmur. RESPIRATORY: Clear to auscultation bilaterally. Effort and breath sounds normal, no problems with respiration noted. BREASTS: Symmetric in size. No masses, skin changes, nipple drainage, or lymphadenopathy. ABDOMEN: Soft, normal bowel sounds, no distention noted.  No tenderness,  rebound or guarding.  BLADDER: Normal PELVIC:  Bladder no bladder distension noted  Urethra: normal appearing urethra with no masses, tenderness or lesions (small caruncle noted)  Vulva: normal appearing vulva with no masses, tenderness or lesions  Vagina: exam deferred today at patient's request.  Previous exam from 11/27/2017 noted  Atrophic vagina and Pelvic Floor Exam cystocele Grade 1, rectocele Grade 1, vaginal prolapse Grade 2  Cervix: not indicated and surgically absent  Uterus: not indicated and surgically absent, vaginal cuff well healed  Adnexa: normal adnexa in size, nontender and no masses  RV: External Exam NormaI, No Rectal Masses and Normal Sphincter tone  MUSCULOSKELETAL: Normal range of motion. No tenderness.  No cyanosis, clubbing, or edema.  2+ distal pulses. LYMPHATIC: No Axillary, Supraclavicular, or Inguinal Adenopathy.   Labs:  Lab Results  Component Value Date   WBC 7.6 11/20/2018   HGB 12.9 11/20/2018   HCT 41.7 11/20/2018   MCV 85.3 11/20/2018   PLT 227 11/20/2018    Lab Results  Component Value Date   CREATININE 0.87 11/20/2018   BUN 16 11/20/2018   NA 141 11/20/2018   K 4.2 11/20/2018   CL 104 11/20/2018   CO2 30 11/20/2018    Lab Results  Component Value Date   ALT 28 11/16/2017   AST 30 11/16/2017   ALKPHOS 76 11/16/2017   BILITOT 0.5 11/16/2017     Lab Results  Component Value Date   CHOL 271 (H) 11/17/2017   HDL 57 11/17/2017   LDLCALC 187 (H) 11/17/2017   TRIG 135 11/17/2017   CHOLHDL 4.8 11/17/2017    Lab Results  Component Value Date   TSH 2.400 11/17/2017    Lab Results  Component Value Date   HGBA1C 5.9 (H) 11/17/2017    Assessment:   Annual gynecologic examination 78 y.o. Underweight, BMI 15 Vaginal vault prolapse (cystocele and rectocele) Vaginal atrophy H/o breast cancer Alzheimer's disease  Plan:  Pap: Not needed.  Patient beyond age 47, and h/o hysterectomy.  Mammogram: completed. Benign. Patient for  repeat mammogram next year.  Stool Guaiac Testing:  Not Ordered.  Has h/o Colonoscopy in 2009. Patient is beyond age of screening, however may still need due to past history of breast cancer. Advised to discuss with her PCP if further screening is recommended.  Labs: Not ordered.  Up to date Routine preventative health maintenance measures emphasized: Alcohol/Substance use risks and Stress Management.  Empahsized Calcium and Vit D supplementation.  Still underweight, notes healthy diet, normal appetite.  Patient with petite frame.  Can increase protein intake, or drink Boost/Ensure for supplementation.  Vaginal vault prolapse - patient relatively asymptomatic (no bowel or urinary disturbances, incontinence, vaginal bulge).  No treatment desired currently.  Vaginal atrophy - patient without complaints, no intervention necessary currently.  Medical comorbidities managed by PCP and Cardiologist/neurologist.   Return to Clinic - Every other year   Rubie Maid, MD  [facility]

## 2019-03-13 ENCOUNTER — Other Ambulatory Visit: Payer: Self-pay

## 2019-03-13 ENCOUNTER — Encounter: Payer: Self-pay | Admitting: Speech Pathology

## 2019-03-13 ENCOUNTER — Ambulatory Visit: Payer: Medicare Other | Attending: Otolaryngology | Admitting: Speech Pathology

## 2019-03-13 DIAGNOSIS — R490 Dysphonia: Secondary | ICD-10-CM | POA: Insufficient documentation

## 2019-03-13 NOTE — Therapy (Addendum)
[place] MAIN [facility] SERVICES [address] [place], [place], [address] Phone: [phone]   Fax:  [phone]  Speech Language Pathology Evaluation  Patient Details  Name: Kathleen Walls MRN: [id number] Date of Birth: 24-Sep-1940 No data recorded  Encounter Date: 03/13/2019  End of Session - 03/13/19 1702    Visit Number  1    Number of Visits  17    Date for SLP Re-Evaluation  05/14/19    SLP Start Time  1406    SLP Stop Time   1500    SLP Time Calculation (min)  54 min    Activity Tolerance  Patient tolerated treatment well       Past Medical History:  Diagnosis Date  . Arthritis   . Breast cancer [facility]) 2009    2009 breast cancer stage IIA T2 N0, ER positive, HER-2 negative, 2.5 cm primary tumor, poorly differentiated, status post wide local excision, one sentinel lymph node negative, high-risk Oncotype score 48  . Cataracts, both eyes   . Colitis   . Complication of anesthesia    confusion  . Coronary artery disease   . Detached vitreous humor   . Diverticulitis   . 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 Gastritis   . GERD (gastroesophageal reflux disease)   . 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 Hemorrhoid   . Hemorrhoids   . History of kidney stones   . Hyperlipemia   . 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 Kidney cysts    Left  . Kidney stones   . Lipoma of shoulder 2009   left  . Migraine   . Osteoarthritis   . Osteopenia   . Stented coronary artery   . Stroke [facility])     Past Surgical History:  Procedure Laterality Date  . ABDOMINAL HYSTERECTOMY    . benign breast biopsy  2007  . BREAST BIOPSY Left 2007   positive  . BREAST CYST ASPIRATION Right    neg  . BREAST LUMPECTOMY Left 2009    2009 breast cancer stage IIA T2 N0, ER positive, HER-2 negative, 2.5 cm primary tumor, poorly differentiated, status post wide local excision, one sentinel lymph node negative,  high-risk Oncotype score 48  . BREAST SURGERY    . CARDIAC CATHETERIZATION N/A 06/22/2015   Procedure: Left Heart Cath and Coronary Angiography;  Surgeon: Teodoro Spray, MD;  Location: [place] [facility];  Service: Cardiovascular;  Laterality: N/A;  . CARDIAC CATHETERIZATION    . CATARACT EXTRACTION    . COLONOSCOPY    . CORONARY ANGIOPLASTY    . ENDARTERECTOMY Right 12/26/2017   Procedure: ENDARTERECTOMY CAROTID;  Surgeon: Katha Cabal, MD;  Location: [facility] ORS;  Service: Vascular;  Laterality: Right;  . EYE SURGERY    . FOOT FRACTURE SURGERY Left   . PARTIAL HYSTERECTOMY    . UPPER GASTROINTESTINAL ENDOSCOPY      There were no vitals filed for this visit.  Subjective Assessment - 03/13/19 1655    Subjective  Pt was very pleasant and completed all evaluation tasks with ease. She was accompanied by her supportive husband.    Currently in Pain?  No/denies         SLP Evaluation [facility] - 03/13/19 1655      SLP Visit Information   SLP Received On  03/13/19    [place] Date  01/30/2019  Medical Diagnosis  Dysphonia      Subjective   Subjective  Pt was very pleasant and cooperative. She was accompanied by her supportive husband.    Patient/Family Stated Goal  I would like my voice to be back to normal.      General Information   HPI  Per ENT evaluation 02/20/2019:  This is a 78 y/o female seen for chief complaint of hoarseness. She has hoarseness that is described as scratchy voice and miild-mod severe with no difficulty swallowing or no throat pain.  The hoarseness has been present for 3 weeks. She feels she is hoarse all the time. No hx of reflux.     Expression   Primary Mode of Expression  Verbal      Verbal Expression   Overall Verbal Expression  Appears within functional limits for tasks assessed      Oral Motor/Sensory Function   Overall Oral Motor/Sensory Function  Appears within functional limits for tasks assessed      Motor Speech   Overall Motor Speech   Impaired    Respiration  Impaired    Level of Impairment  Conversation    Phonation  Hoarse;Low vocal intensity   mild   Resonance  Within functional limits    Motor Speech Errors  Not applicable    Effective Techniques  Increased vocal intensity;Pacing    Phonation  Impaired    Vocal Abuses  Habitual Hyperphonia;Smoking;Prolonged Vocal Use;Vocal Fold Dehydration    Tension Present  Neck    Volume  Soft    Pitch  Low       Perceptual Voice Evaluation    Voice checklist: ? 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 ? Characteristic voice use: no risks identified ? Environmental risks: no risks identified ? Misuse: speaks with tension without adequate breath support. ? Abuse: none identified ? Vocal characteristics: have experienced vocal fatigue, limited pitch range, reduced volume and inability to project, changes in usual quality including hoarseness and strain (pt describes as "raspy" Patient quality of life survey: VHI-10: 21 (A score of 10 or higher indicate voice handicap) Maximum phonation time for sustained "ah": 6.21 seconds Average fundamental frequency during sustained "ah": 243.72 Hz Habitual pitch fundamental frequency: 188.32 Hz Highest dynamic pitch when altering pitch from a low note to a high note:  512.22 Hz Lowest dynamic pitch when altering pitch from a high note to a low note: 153.07 Hz Highest dynamic pitch in conversational speech: 375.52 Hz Lowest dynamic pitch in conversational speech: 120.76 Hz Average time patient was able to sustain /s/: seconds Average time patient was able to sustain /z/: 18.4 seconds S/z ratio (longest /s/ divided by longest /z/): 1.12  Visi-Pitch: Multi-Dimensional Voice Program (MDVP) MDVP extracts objective  quantitative values (Relative Average Perturbation, Shimmer, Voice Turbulence Index, and Noise to Harmonic Ratio) on sustained phonation, which are displayed graphically and numerically in comparison to a built in IT sales professional.   The patient exhibited values within normal limits. Average fundamental frequency (243.72) is within normative limits for women (244 Hz + 27).     SLP Education - 03/13/19 1702    Education Details  Educated pt re: role of SLP in voice treatment, vocal hygiene, diaphragmatic breathing, extrinsic laryngeal/cervical stretches    Person(s) Educated  Patient;Spouse    Methods  Explanation;Demonstration;Verbal cues;Handout    Comprehension  Verbalized understanding;Need further instruction;Returned demonstration;Verbal cues required         SLP Long Term Goals - 03/13/19 1706      SLP LONG TERM GOAL #1   Title  Pt will minimize vocal tension via resonant voice therapy (or comparable technique) with min SLP cues with 80% accuracy.    Time  8    Period  Weeks    Status  New    Target Date  05/14/19      SLP LONG TERM GOAL #2   Title  Pt will be independent for breath support and extrinsic laryngeal/cervical exercises.    Time  8    Period  Weeks    Status  New    Target Date  05/14/19      SLP LONG TERM GOAL #3   Title  Pt will demonstrate independent understanding  and use of vocal hygiene concepts.    Time  8    Period  Weeks    Status  New    Target Date  05/14/19      SLP LONG TERM GOAL #4   Title  Pt will maintain relaxed phonation and forward resonance for a paragraph length recitation with 80% accuracy.    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    Speech Therapy Frequency  2x / week   8 weeks   Duration  --   8 weeks   Treatment/Interventions  SLP instruction and feedback;Compensatory strategies;Patient/family education;Functional tasks    Potential to Achieve Goals  Good    Potential Considerations  Ability to learn/carryover information;Family/community support;Previous level of function;Cooperation/participation level    SLP Home Exercise Plan  Provided Diaphragmatic breathing and extrinsic laryngeal/cervical stretches to decrease muscular tension, practice strict adherence to vocal hygiene, improve hydration and reduce/eliminate habitual abusive throat clearing    Consulted and Agree with Plan of Care  Patient;Family member/caregiver    Family Member Consulted  husband       Patient will benefit from skilled therapeutic intervention in order to improve the following deficits and impairments:   1. Dysphonia       Problem List Patient Active Problem List   Diagnosis Date Noted  . Carotid stenosis 12/26/2017  . TIA (transient ischemic attack) 11/16/2017  . Acute cerebrovascular accident (CVA) ([place]) 11/16/2017  . Carcinoma of overlapping sites of left breast in female, estrogen receptor positive ([place]) 11/17/2016  . Symptomatic carotid artery stenosis without infarction 09/11/2016  . H/O appendicitis 08/01/2016  . Thyromegaly 12/22/2015  . Chest pain 05/14/2015  . Arteriosclerosis of coronary artery 07/22/2014  . Disorder of mitral valve 01/17/2014  . Absolute anemia 10/23/2013  . Benign hypertension 10/23/2013  . Dermatophytic onychia 10/23/2013  . HLD (hyperlipidemia) 10/23/2013  . Idiopathic peripheral neuropathy 10/23/2013    Kathleen Sleeth, MA, CCC-SLP 03/13/2019, 5:19 PM  [place] MAIN [facility]  SERVICES [address]  [place], [place], [address] Phone: [phone]   Fax:  [phone]  Name: Kathleen Walls MRN: [id number] Date of Birth: 05/27/1941

## 2019-03-17 ENCOUNTER — Ambulatory Visit: Payer: Medicare Other | Attending: Otolaryngology | Admitting: Speech Pathology

## 2019-03-17 ENCOUNTER — Other Ambulatory Visit: Payer: Self-pay

## 2019-03-17 DIAGNOSIS — R490 Dysphonia: Secondary | ICD-10-CM | POA: Diagnosis not present

## 2019-03-17 NOTE — Addendum Note (Signed)
Addended by: Jay Schlichter on: 03/17/2019 09:29 AM   Modules accepted: Orders

## 2019-03-20 ENCOUNTER — Encounter: Payer: Self-pay | Admitting: Speech Pathology

## 2019-03-20 ENCOUNTER — Ambulatory Visit: Payer: Medicare Other | Admitting: Speech Pathology

## 2019-03-20 ENCOUNTER — Other Ambulatory Visit: Payer: Self-pay

## 2019-03-20 DIAGNOSIS — R490 Dysphonia: Secondary | ICD-10-CM

## 2019-03-20 NOTE — Therapy (Signed)
[place] MAIN [facility] SERVICES [address] [place], [place], [address] Phone: [phone]   Fax:  [phone]  Speech Language Pathology Treatment  Patient Details  Name: Kathleen Walls MRN: [id number] Date of Birth: 12-26-1940 No data recorded  Encounter Date: 03/17/2019  End of Session - 03/20/19 0935    Visit Number  2    Number of Visits  17    Date for SLP Re-Evaluation  05/14/19    Authorization Time Period  1/10 tx until Medicare progress note    SLP Start Time  60    SLP Stop Time   1550    SLP Time Calculation (min)  47 min    Activity Tolerance  Patient tolerated treatment well       Past Medical History:  Diagnosis Date  . Arthritis   . Breast cancer [facility]) 2009    2009 breast cancer stage IIA T2 N0, ER positive, HER-2 negative, 2.5 cm primary tumor, poorly differentiated, status post wide local excision, one sentinel lymph node negative, high-risk Oncotype score 48  . Cataracts, both eyes   . Colitis   . Complication of anesthesia    confusion  . Coronary artery disease   . Detached vitreous humor   . Diverticulitis   . 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 Gastritis   . GERD (gastroesophageal reflux disease)   . 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 Hemorrhoid   . Hemorrhoids   . History of kidney stones   . Hyperlipemia   . 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 Kidney cysts    Left  . Kidney stones   . Lipoma of shoulder 2009   left  . Migraine   . Osteoarthritis   . Osteopenia   . Stented coronary artery   . Stroke [facility])     Past Surgical History:  Procedure Laterality Date  . ABDOMINAL HYSTERECTOMY    . benign breast biopsy  2007  . BREAST BIOPSY Left 2007   positive  . BREAST CYST ASPIRATION Right    neg  . BREAST LUMPECTOMY Left 2009    2009 breast cancer stage IIA T2 N0, ER positive, HER-2 negative, 2.5 cm primary tumor, poorly differentiated,  status post wide local excision, one sentinel lymph node negative, high-risk Oncotype score 48  . BREAST SURGERY    . CARDIAC CATHETERIZATION N/A 06/22/2015   Procedure: Left Heart Cath and Coronary Angiography;  Surgeon: Teodoro Spray, MD;  Location: [place] CV LAB;  Service: Cardiovascular;  Laterality: N/A;  . CARDIAC CATHETERIZATION    . CATARACT EXTRACTION    . COLONOSCOPY    . CORONARY ANGIOPLASTY    . ENDARTERECTOMY Right 12/26/2017   Procedure: ENDARTERECTOMY CAROTID;  Surgeon: Katha Cabal, MD;  Location: [facility] ORS;  Service: Vascular;  Laterality: Right;  . EYE SURGERY    . FOOT FRACTURE SURGERY Left   . PARTIAL HYSTERECTOMY    . UPPER GASTROINTESTINAL ENDOSCOPY      There were no vitals filed for this visit.  Subjective Assessment - 03/20/19 0931    Subjective  Pt was very pleasant and completed all tx tasks with ease. Husband present and very supportive.    Currently in Pain?  No/denies            ADULT SLP TREATMENT - 03/20/19 0001      General Information   Behavior/Cognition  Alert;Cooperative;Pleasant mood    Patient Positioning  Upright in chair    Oral care provided  N/A    HPI  Per ENT evaluation 02/20/2019:  This is a 78 y/o female seen for chief complaint of hoarseness. She has hoarseness that is described as scratchy voice and miild-mod severe with no difficulty swallowing or no throat pain.  The hoarseness has been present for 3 weeks. She feels she is hoarse all the time. No hx of reflux. Pt seeking voice tx to improve vocal quality.      Treatment Provided   Treatment provided  Cognitive-Linquistic      Pain Assessment   Pain Assessment  No/denies pain      Cognitive-Linquistic Treatment   Treatment focused on  Voice    Skilled Treatment Patient performed following resonant voice exercises to decrease laryngeal strain and promote optimal vocal quality:  Sustained /m/ hum x5, /ng/ hum x10 given min verbal cues for increased vocal  intensity.  Hum+vowels x3 sets given intermittent min cues for increased vocal intensity  Hum-descending glide x8: Patient performed glide down with intermittent cues to increase duration of glide with more ease.  Hum+ initial /m/ words given min verbal and visual cues for location of vocal resonance and increased vocal intensity.  Hum+ initial /m/ phrases x2 sets given min verbal cues for overarticulation.  Patient read 5-8 word initial nasal sentences with increased vocal intensity and forward resonance with 100% accuracy given intermittent verbal cues for pacing.  Pt read 3 sentence paragraph aloud with 70% accuracy with improved vocal clarity and intensity given min to mod verbal cues for pacing.   Educated pt & husband at length re: vocal hygiene and importance of hydration and strict adherence to vocal hygiene recommendations for optimal vocal health      Assessment / Recommendations / Plan   Plan  Continue with current plan of care      Progression Toward Goals   Progression toward goals  Progressing toward goals       SLP Education - 03/20/19 0934    Education Details  Educated pt & husband re: vocal hygiene, diaphragmatic breathing and resonant voice techniques to improve vocal clarity and reduce vocal tension and strain    Person(s) Educated  Patient;Spouse    Methods  Explanation;Demonstration;Verbal cues;Handout    Comprehension  Verbalized understanding;Need further instruction;Returned demonstration;Verbal cues required         SLP Long Term Goals - 03/13/19 1706      SLP LONG TERM GOAL #1   Title  Pt will minimize vocal tension via resonant voice therapy (or comparable technique) with min SLP cues with 80% accuracy.    Time  8    Period  Weeks    Status  New    Target Date  05/14/19      SLP LONG TERM GOAL #2   Title  Pt will be independent for breath support and extrinsic laryngeal/cervical exercises.    Time  8    Period  Weeks    Status  New    Target  Date  05/14/19      SLP LONG TERM GOAL #3   Title  Pt will demonstrate independent understanding  and use of vocal hygiene concepts.    Time  8    Period  Weeks    Status  New    Target Date  05/14/19      SLP LONG TERM GOAL #4   Title  Pt will  maintain relaxed phonation and forward resonance for a paragraph length recitation with 80% accuracy.    Time  8    Period  Weeks    Status  New    Target Date  05/14/19       Plan - 03/20/19 0936    Clinical Impression Statement  Pt easily applied resonant voice techniques with accuracy given min verbal and visual cues up to the 5-8 word sentence level. Cues for pacing were most effective for lengthier productions. Pt continues to require skilled ST services for education and practice of resonant voice techniques and compensatory strategies to decrease hoarseness and strain and promote a health voice.   Speech Therapy Frequency  2x / week    Duration  Other (comment)   8 weeks   Treatment/Interventions  SLP instruction and feedback;Compensatory strategies;Patient/family education;Functional tasks    Potential to Achieve Goals  Good    Potential Considerations  Ability to learn/carryover information;Family/community support;Previous level of function;Cooperation/participation level    SLP Home Exercise Plan  Diaphragmatic breathing and extrinsic laryngeal/cervical stretches, practice strict adherence to vocal hygiene, improve hydration and reduce/eliminate habitual abusive throat clearing, resonant voice tx techniques/exercises    Consulted and Agree with Plan of Care  Patient;Family member/caregiver    Family Member Consulted  husband       Patient will benefit from skilled therapeutic intervention in order to improve the following deficits and impairments:   1. Dysphonia       Problem List Patient Active Problem List   Diagnosis Date Noted  . Carotid stenosis 12/26/2017  . TIA (transient ischemic attack) 11/16/2017  . Acute  cerebrovascular accident (CVA) ([place]) 11/16/2017  . Carcinoma of overlapping sites of left breast in female, estrogen receptor positive ([place]) 11/17/2016  . Symptomatic carotid artery stenosis without infarction 09/11/2016  . H/O appendicitis 08/01/2016  . Thyromegaly 12/22/2015  . Chest pain 05/14/2015  . Arteriosclerosis of coronary artery 07/22/2014  . Disorder of mitral valve 01/17/2014  . Absolute anemia 10/23/2013  . Benign hypertension 10/23/2013  . Dermatophytic onychia 10/23/2013  . HLD (hyperlipidemia) 10/23/2013  . Idiopathic peripheral neuropathy 10/23/2013    Rulon Eisenmenger, MA, CCC-SLP 03/20/2019, 11:47 AM  [place] MAIN [facility] SERVICES [address] [place], [place], [address] Phone: [phone]   Fax:  [phone]   Name: Kathleen Walls MRN: [id number] Date of Birth: [birth year]

## 2019-03-20 NOTE — Therapy (Signed)
[place] MA[facility] SERVICES [address] [place], [place], [address] Phone: [phone]   Fax:  [phone]  Speech Language Pathology Treatment  Patient Details  Name: Kathleen Walls MRN: [id number] Date of Birth: 03-08-1941 No data recorded  Encounter Date: 03/20/2019  End of Session - 03/20/19 1507    Visit Number  3    Number of Visits  17    Date for SLP Re-Evaluation  05/14/19    Authorization Time Period  2/10 tx until Medicare progress note    SLP Start Time  1400    SLP Stop Time   1453    SLP Time Calculation (min)  53 min    Activity Tolerance  Patient tolerated treatment well       Past Medical History:  Diagnosis Date  . Arthritis   . Breast cancer [facility]) 2009    2009 breast cancer stage IIA T2 N0, ER positive, HER-2 negative, 2.5 cm primary tumor, poorly differentiated, status post wide local excision, one sentinel lymph node negative, high-risk Oncotype score 48  . Cataracts, both eyes   . Colitis   . Complication of anesthesia    confusion  . Coronary artery disease   . Detached vitreous humor   . Diverticulitis   . 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 Gastritis   . GERD (gastroesophageal reflux disease)   . 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 Hemorrhoid   . Hemorrhoids   . History of kidney stones   . Hyperlipemia   . 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 Kidney cysts    Left  . Kidney stones   . Lipoma of shoulder 2009   left  . Migraine   . Osteoarthritis   . Osteopenia   . Stented coronary artery   . Stroke [facility])     Past Surgical History:  Procedure Laterality Date  . ABDOMINAL HYSTERECTOMY    . benign breast biopsy  2007  . BREAST BIOPSY Left 2007   positive  . BREAST CYST ASPIRATION Right    neg  . BREAST LUMPECTOMY Left 2009    2009 breast cancer stage IIA T2 N0, ER positive, HER-2 negative, 2.5 cm primary tumor, poorly differentiated,  status post wide local excision, one sentinel lymph node negative, high-risk Oncotype score 48  . BREAST SURGERY    . CARDIAC CATHETERIZATION N/A 06/22/2015   Procedure: Left Heart Cath and Coronary Angiography;  Surgeon: Teodoro Spray, MD;  Location: [place] [facility];  Service: Cardiovascular;  Laterality: N/A;  . CARDIAC CATHETERIZATION    . CATARACT EXTRACTION    . COLONOSCOPY    . CORONARY ANGIOPLASTY    . ENDARTERECTOMY Right 12/26/2017   Procedure: ENDARTERECTOMY CAROTID;  Surgeon: Katha Cabal, MD;  Location: [facility] ORS;  Service: Vascular;  Laterality: Right;  . EYE SURGERY    . FOOT FRACTURE SURGERY Left   . PARTIAL HYSTERECTOMY    . UPPER GASTROINTESTINAL ENDOSCOPY      There were no vitals filed for this visit.  Subjective Assessment - 03/20/19 1354    Subjective  Pt was very pleasant and cooperative, very intent on performing tx tasks with accuracy. Husband present and very supportive.    Patient is accompained by:  Family member    Currently in Pain?  No/denies            ADULT SLP TREATMENT -  03/20/19 1504      General Information   Behavior/Cognition  Alert;Cooperative;Pleasant mood    Patient Positioning  Upright in chair    Oral care provided  N/A    HPI  Per ENT evaluation 02/20/2019:  This is a 78 y/o female seen for chief complaint of hoarseness. She has hoarseness that is described as scratchy voice and miild-mod severe with no difficulty swallowing or no throat pain.  The hoarseness has been present for 3 weeks. She feels she is hoarse all the time. No hx of reflux. Pt seeking voice tx to improve vocal quality.      Treatment Provided   Treatment provided  Cognitive-Linquistic      Pain Assessment   Pain Assessment  No/denies pain      Cognitive-Linquistic Treatment   Treatment focused on  Voice    Skilled Treatment  Pt participated in 5 minute Block practice of diaphragmatic breathing given mod to max verbal and visual cues.  Patient  performed "pulsed abdominal breathing" x 4 given min verbal and visual cues.  Patient performed following resonant voice exercises to decrease laryngeal strain and promote optimal vocal quality: Sustained /m/ hum x20, /ng/ hum x5 given min verbal cues for increased vocal intensity without strain.  Hum+vowels x10 sets given regular verbal cues for sustained hum prior to producing vowel. Hum-descending glide x5: Patient performed glide down with min verbal and visual cues to increase duration of glide with more ease.  Hum+siren x8 given mod verbal and visual cues for increased vocal intensity and use of abdominal breathing. Hum+ initial /m/ words given min to mod verbal and visual cues for overarticulation and pacing.  Patient counted aloud 1-10 and recited DOW, MOY, connecting each production given min verbal cues for overarticulation, pacing and increased vocal intensity to improve vocal clarity and decrease hoarseness.  Hum+ initial /m/ phrases x4 sets given min verbal cues for overarticulation.  Patient read 5 word initial nasal sentences with increased vocal intensity and forward resonance with 100% accuracy given intermittent verbal cues for pacing.  Reinforced pt education re: vocal hygiene and importance of hydration, choosing to drink a sip of water or swallow instead of throat clearing or coughing as well as strict adherence to vocal hygiene recommendations for optimal vocal health.     Assessment / Recommendations / Plan   Plan  Continue with current plan of care      Progression Toward Goals   Progression toward goals  Progressing toward goals       SLP Education - 03/20/19 1505    Education Details  Educated pt & husband re: importance of hydration for optimal vocal health, diaphragmatic breathing, resonant voice techniques to improve vocal clarity and reduce vocal tension & strain    Person(s) Educated  Patient;Spouse    Methods  Explanation;Demonstration;Verbal cues;Handout     Comprehension  Verbalized understanding;Need further instruction;Returned demonstration;Verbal cues required         SLP Long Term Goals - 03/13/19 1706      SLP LONG TERM GOAL #1   Title  Pt will minimize vocal tension via resonant voice therapy (or comparable technique) with min SLP cues with 80% accuracy.    Time  8    Period  Weeks    Status  New    Target Date  05/14/19      SLP LONG TERM GOAL #2   Title  Pt will be independent for breath support and extrinsic laryngeal/cervical exercises.  Time  8    Period  Weeks    Status  New    Target Date  05/14/19      SLP LONG TERM GOAL #3   Title  Pt will demonstrate independent understanding  and use of vocal hygiene concepts.    Time  8    Period  Weeks    Status  New    Target Date  05/14/19      SLP LONG TERM GOAL #4   Title  Pt will maintain relaxed phonation and forward resonance for a paragraph length recitation with 80% accuracy.    Time  8    Period  Weeks    Status  New    Target Date  05/14/19       Plan - 03/20/19 1507    Clinical Impression Statement  Increased level and consistency of cueing required this session to shape pt performance of forward resonance exercises. Visual cues most immediately effective in improving pt performance, and improving vocal clarity and reducing vocal strain. Rec. continued skilled ST services for education and practice of resonant voice techniques and compensatory strategies to decrease hoarseness and strain and promote a healthy voice.   Speech Therapy Frequency  2x / week    Duration  Other (comment)   8 weeks   Treatment/Interventions  SLP instruction and feedback;Compensatory strategies;Patient/family education;Functional tasks    Potential to Achieve Goals  Good    Potential Considerations  Ability to learn/carryover information;Family/community support;Previous level of function;Cooperation/participation level    SLP Home Exercise Plan  Diaphragmatic breathing and  extrinsic laryngeal/cervical stretches, practice strict adherence to vocal hygiene, improve hydration and reduce/eliminate habitual abusive throat clearing, resonant voice tx techniques/exercises    Consulted and Agree with Plan of Care  Patient;Family member/caregiver    Family Member Consulted  husband       Patient will benefit from skilled therapeutic intervention in order to improve the following deficits and impairments:   1. Dysphonia       Problem List Patient Active Problem List   Diagnosis Date Noted  . Carotid stenosis 12/26/2017  . TIA (transient ischemic attack) 11/16/2017  . Acute cerebrovascular accident (CVA) ([place]) 11/16/2017  . Carcinoma of overlapping sites of left breast in female, estrogen receptor positive ([place]) 11/17/2016  . Symptomatic carotid artery stenosis without infarction 09/11/2016  . H/O appendicitis 08/01/2016  . Thyromegaly 12/22/2015  . Chest pain 05/14/2015  . Arteriosclerosis of coronary artery 07/22/2014  . Disorder of mitral valve 01/17/2014  . Absolute anemia 10/23/2013  . Benign hypertension 10/23/2013  . Dermatophytic onychia 10/23/2013  . HLD (hyperlipidemia) 10/23/2013  . Idiopathic peripheral neuropathy 10/23/2013    Rulon Eisenmenger, MA, CCC-SLP 03/20/2019, 3:17 PM  [place] MAIN [facility] SERVICES [address] [place], [place], [address] Phone: [phone]   Fax:  [phone]   Name: Kathleen Walls MRN: [id number] Date of Birth: [birth year]

## 2019-03-24 ENCOUNTER — Ambulatory Visit: Payer: Medicare Other | Admitting: Speech Pathology

## 2019-03-24 ENCOUNTER — Encounter: Payer: Self-pay | Admitting: Speech Pathology

## 2019-03-24 ENCOUNTER — Other Ambulatory Visit: Payer: Self-pay

## 2019-03-24 DIAGNOSIS — R490 Dysphonia: Secondary | ICD-10-CM

## 2019-03-24 NOTE — Therapy (Signed)
[place] MAIN [facility] SERVICES [address] [place], [place], [address] Phone: [phone]   Fax:  [phone]  Speech Language Pathology Treatment  Patient Details  Name: Kathleen Walls MRN: [id number] Date of Birth: [birth year] No data recorded  Encounter Date: 03/24/2019  End of Session - 03/24/19 1717    Visit Number  4    Number of Visits  17    Date for SLP Re-Evaluation  05/14/19    Authorization Time Period  3/10 tx until Medicare progress note    SLP Start Time  1501    SLP Stop Time   1602    SLP Time Calculation (min)  61 min    Activity Tolerance  Patient tolerated treatment well       Past Medical History:  Diagnosis Date  . Arthritis   . Breast cancer [facility]) 2009    2009 breast cancer stage IIA T2 N0, ER positive, HER-2 negative, 2.5 cm primary tumor, poorly differentiated, status post wide local excision, one sentinel lymph node negative, high-risk Oncotype score 48  . Cataracts, both eyes   . Colitis   . Complication of anesthesia    confusion  . Coronary artery disease   . Detached vitreous humor   . Diverticulitis   . 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 Gastritis   . GERD (gastroesophageal reflux disease)   . 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 Hemorrhoid   . Hemorrhoids   . History of kidney stones   . Hyperlipemia   . 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 Kidney cysts    Left  . Kidney stones   . Lipoma of shoulder 2009   left  . Migraine   . Osteoarthritis   . Osteopenia   . Stented coronary artery   . Stroke [facility])     Past Surgical History:  Procedure Laterality Date  . ABDOMINAL HYSTERECTOMY    . benign breast biopsy  2007  . BREAST BIOPSY Left 2007   positive  . BREAST CYST ASPIRATION Right    neg  . BREAST LUMPECTOMY Left 2009    2009 breast cancer stage IIA T2 N0, ER positive, HER-2 negative, 2.5 cm primary tumor, poorly differentiated,  status post wide local excision, one sentinel lymph node negative, high-risk Oncotype score 48  . BREAST SURGERY    . CARDIAC CATHETERIZATION N/A 06/22/2015   Procedure: Left Heart Cath and Coronary Angiography;  Surgeon: Teodoro Spray, MD;  Location: [place] [facility];  Service: Cardiovascular;  Laterality: N/A;  . CARDIAC CATHETERIZATION    . CATARACT EXTRACTION    . COLONOSCOPY    . CORONARY ANGIOPLASTY    . ENDARTERECTOMY Right 12/26/2017   Procedure: ENDARTERECTOMY CAROTID;  Surgeon: Katha Cabal, MD;  Location: [facility] ORS;  Service: Vascular;  Laterality: Right;  . EYE SURGERY    . FOOT FRACTURE SURGERY Left   . PARTIAL HYSTERECTOMY    . UPPER GASTROINTESTINAL ENDOSCOPY      There were no vitals filed for this visit.  Subjective Assessment - 03/24/19 1513    Subjective  Pt was very pleasant and cooperative, pt attempts all tasks with determination, appears frustrated with her difficulty with self-monitoring/correcting her performance on tx tasks. Husband present and very supportive.    Currently in Pain?  No/denies            ADULT SLP TREATMENT -  03/24/19 0001      General Information   Behavior/Cognition  Alert;Cooperative;Pleasant mood    Patient Positioning  Upright in chair    Oral care provided  N/A    HPI  Per ENT evaluation 02/20/2019:  This is a 78 y/o female seen for chief complaint of hoarseness. She has hoarseness that is described as scratchy voice and miild-mod severe with no difficulty swallowing or no throat pain.  The hoarseness has been present for 3 weeks. She feels she is hoarse all the time. No hx of reflux. Pt seeking voice tx to improve vocal quality.      Treatment Provided   Treatment provided  Cognitive-Linquistic      Pain Assessment   Pain Assessment  No/denies pain      Cognitive-Linquistic Treatment   Treatment focused on  Voice    Skilled Treatment  Pt participated in 5 minute Block practice of diaphragmatic breathing given mod to  max verbal and visual cues.  Patient performed "pulsed abdominal breathing" x 8 given mod verbal and visual cues.  Noted increased L laryngeal tension and increased resistance with gentle tractioning larynx to right. Patient required min to mod verbal, visual and tactile cues to locate thyroid cartilage to perform gentle laryngeal tractioning. Patient performed gentle traction of larynx to R, sustaining for 15 seconds x3. Pt performed gentle tractioning of larynx in a downward direction for 15sec x3 reps.  Patient performed following resonant voice exercises to decrease laryngeal strain and promote optimal vocal quality:  Sustained /m/ hum x20, /ng/ hum x2 given min verbal cues for increased vocal intensity without strain. Hum+vowels x10 sets given regular verbal cues for sustained hum prior to producing vowel. Hum+siren x4 given mod verbal and visual cues for increased vocal intensity and use of abdominal breathing. Hum+ initial /m/ words given min to mod verbal and visual cues for overarticulation and pacing. Patient recited DOW, MOY, connecting each production given min to mod verbal cues for overarticulation, pacing and increased vocal intensity to improve vocal clarity and decrease hoarseness. Reinforced pt education re: vocal hygiene and importance of hydration, choosing to drink a sip of water or swallow instead of throat clearing or coughing as well as strict adherence to vocal hygiene recommendations for optimal vocal health.      Assessment / Recommendations / Plan   Plan  Continue with current plan of care      Progression Toward Goals   Progression toward goals  Progressing toward goals       SLP Education - 03/24/19 1716    Education Details  Provided education re: importance of hydration for optimal vocal health, diaphragmatic breathing, resonant voice techniques to improve vocal clarity and reduce vocal tension and strain.    Person(s) Educated  Patient;Spouse    Methods   Explanation;Verbal cues;Demonstration    Comprehension  Verbalized understanding;Returned demonstration;Verbal cues required;Need further instruction         SLP Long Term Goals - 03/13/19 1706      SLP LONG TERM GOAL #1   Title  Pt will minimize vocal tension via resonant voice therapy (or comparable technique) with min SLP cues with 80% accuracy.    Time  8    Period  Weeks    Status  New    Target Date  05/14/19      SLP LONG TERM GOAL #2   Title  Pt will be independent for breath support and extrinsic laryngeal/cervical exercises.    Time  8  Period  Weeks    Status  New    Target Date  05/14/19      SLP LONG TERM GOAL #3   Title  Pt will demonstrate independent understanding  and use of vocal hygiene concepts.    Time  8    Period  Weeks    Status  New    Target Date  05/14/19      SLP LONG TERM GOAL #4   Title  Pt will maintain relaxed phonation and forward resonance for a paragraph length recitation with 80% accuracy.    Time  8    Period  Weeks    Status  New    Target Date  05/14/19       Plan - 03/24/19 1718    Clinical Impression Statement Pt continues to require increased level of cueing to decrease vocal strain and improve vocal clarity. Pt often requires repetition of tx instructions/cues to perform with accuracy. Pt anxiety appears to increase when she senses she is not performing tasks as she feels she should. Provided education re: use of diaphragmatic breathing to decrease tension and create ease as well as regular practice of cervical and extrinsic laryngeal stretches. Rec continued skilled ST services for education and practice of resonant voice techniques and compensatory strategies to decrease hoarseness and strain and promote a healthy voice.   Speech Therapy Frequency  2x / week    Duration  Other (comment)   8 weeks   Treatment/Interventions  SLP instruction and feedback;Compensatory strategies;Patient/family education;Functional tasks     Potential to Achieve Goals  Good    Potential Considerations  Ability to learn/carryover information;Family/community support;Previous level of function;Cooperation/participation level    SLP Home Exercise Plan  Diaphragmatic breathing and extrinsic laryngeal/cervical stretches, practice strict adherence to vocal hygiene, improve hydration and reduce/eliminate habitual abusive throat clearing, resonant voice tx techniques/exercises    Consulted and Agree with Plan of Care  Patient;Family member/caregiver    Family Member Consulted  husband       Patient will benefit from skilled therapeutic intervention in order to improve the following deficits and impairments:   1. Dysphonia       Problem List Patient Active Problem List   Diagnosis Date Noted  . Carotid stenosis 12/26/2017  . TIA (transient ischemic attack) 11/16/2017  . Acute cerebrovascular accident (CVA) ([place]) 11/16/2017  . Carcinoma of overlapping sites of left breast in female, estrogen receptor positive ([place]) 11/17/2016  . Symptomatic carotid artery stenosis without infarction 09/11/2016  . H/O appendicitis 08/01/2016  . Thyromegaly 12/22/2015  . Chest pain 05/14/2015  . Arteriosclerosis of coronary artery 07/22/2014  . Disorder of mitral valve 01/17/2014  . Absolute anemia 10/23/2013  . Benign hypertension 10/23/2013  . Dermatophytic onychia 10/23/2013  . HLD (hyperlipidemia) 10/23/2013  . Idiopathic peripheral neuropathy 10/23/2013    Rulon Eisenmenger, MA, CCC-SLP 03/24/2019, 5:35 PM  [place] MAIN [facility] SERVICES [address] [place], [place], [address] Phone: [phone]   Fax:  [phone]   Name: GAILYN CROOK MRN: [id number] Date of Birth: October 02, 1940

## 2019-03-27 ENCOUNTER — Other Ambulatory Visit: Payer: Self-pay

## 2019-03-27 ENCOUNTER — Ambulatory Visit: Payer: Medicare Other | Admitting: Speech Pathology

## 2019-03-27 ENCOUNTER — Encounter: Payer: Self-pay | Admitting: Speech Pathology

## 2019-03-27 DIAGNOSIS — M341 CR(E)ST syndrome: Secondary | ICD-10-CM | POA: Insufficient documentation

## 2019-03-27 DIAGNOSIS — R490 Dysphonia: Secondary | ICD-10-CM

## 2019-03-27 DIAGNOSIS — Z79899 Other long term (current) drug therapy: Secondary | ICD-10-CM | POA: Insufficient documentation

## 2019-03-27 NOTE — Therapy (Signed)
[place] MAIN [facility] SERVICES [address] [place], [place], [address] Phone: [phone]   Fax:  [phone]  Speech Language Pathology Treatment  Patient Details  Name: Kathleen Walls MRN: [id number] Date of Birth: 1941/07/30 No data recorded  Encounter Date: 03/27/2019  End of Session - 03/27/19 1609    Visit Number  5    Number of Visits  17    Date for SLP Re-Evaluation  05/14/19    Authorization Time Period  4/10 tx until Medicare progress note    SLP Start Time  1502    SLP Stop Time   1556    SLP Time Calculation (min)  54 min    Activity Tolerance  Patient tolerated treatment well       Past Medical History:  Diagnosis Date  . Arthritis   . Breast cancer [facility]) 2009    2009 breast cancer stage IIA T2 N0, ER positive, HER-2 negative, 2.5 cm primary tumor, poorly differentiated, status post wide local excision, one sentinel lymph node negative, high-risk Oncotype score 48  . Cataracts, both eyes   . Colitis   . Complication of anesthesia    confusion  . Coronary artery disease   . Detached vitreous humor   . Diverticulitis   . 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 Gastritis   . GERD (gastroesophageal reflux disease)   . 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 Hemorrhoid   . Hemorrhoids   . History of kidney stones   . Hyperlipemia   . 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 Kidney cysts    Left  . Kidney stones   . Lipoma of shoulder 2009   left  . Migraine   . Osteoarthritis   . Osteopenia   . Stented coronary artery   . Stroke [facility])     Past Surgical History:  Procedure Laterality Date  . ABDOMINAL HYSTERECTOMY    . benign breast biopsy  2007  . BREAST BIOPSY Left 2007   positive  . BREAST CYST ASPIRATION Right    neg  . BREAST LUMPECTOMY Left 2009    2009 breast cancer stage IIA T2 N0, ER positive, HER-2 negative, 2.5 cm primary tumor, poorly differentiated,  status post wide local excision, one sentinel lymph node negative, high-risk Oncotype score 48  . BREAST SURGERY    . CARDIAC CATHETERIZATION N/A 06/22/2015   Procedure: Left Heart Cath and Coronary Angiography;  Surgeon: Teodoro Spray, MD;  Location: [place] [facility];  Service: Cardiovascular;  Laterality: N/A;  . CARDIAC CATHETERIZATION    . CATARACT EXTRACTION    . COLONOSCOPY    . CORONARY ANGIOPLASTY    . ENDARTERECTOMY Right 12/26/2017   Procedure: ENDARTERECTOMY CAROTID;  Surgeon: Katha Cabal, MD;  Location: [facility] ORS;  Service: Vascular;  Laterality: Right;  . EYE SURGERY    . FOOT FRACTURE SURGERY Left   . PARTIAL HYSTERECTOMY    . UPPER GASTROINTESTINAL ENDOSCOPY      There were no vitals filed for this visit.  Subjective Assessment - 03/27/19 1606    Subjective  Pt was very pleasant and cooperative, often required repetition of directions and visual model to complete tasks with accuracy. Husband present and supportive.    Patient is accompained by:  Family member   husband   Currently in Pain?  No/denies  ADULT SLP TREATMENT - 03/27/19 0001      General Information   Behavior/Cognition  Alert;Cooperative;Pleasant mood    Patient Positioning  Upright in chair    Oral care provided  N/A    HPI  Per ENT evaluation 02/20/2019:  This is a 78 y/o female seen for chief complaint of hoarseness. She has hoarseness that is described as scratchy voice and miild-mod severe with no difficulty swallowing or no throat pain.  The hoarseness has been present for 3 weeks. She feels she is hoarse all the time. No hx of reflux. Pt seeking voice tx to improve vocal quality.      Treatment Provided   Treatment provided  Cognitive-Linquistic      Pain Assessment   Pain Assessment  No/denies pain      Cognitive-Linquistic Treatment   Treatment focused on  Voice    Skilled Treatment  Pt performed diaphragmatic breathing exercises x8 given min to mod verbal and visual  cues.  Patient performed following resonant voice exercises to decrease laryngeal strain and promote optimal vocal quality: Sustained /m/ hum x15 given min verbal cues for inhale thru nose prior to initiating phonation which resulted in improved vocal clarity.  Hum+vowels x30 sets given regular verbal cues for sustained hum prior to producing vowel and overarticulation  Hum+siren x10 given mod verbal and visual cues for sustained hum prior to producing vowel and overarticulation  10 minute block practice: Hum+ initial /m/ words given min to mod verbal and visual cues for overarticulation and pacing.  Patient recited DOW, counted 1-5 forward & backward, connecting each production given min to mod verbal cues for overarticulation, pacing and increased vocal intensity to improve vocal clarity and decrease hoarseness.  Reinforced pt education re: vocal hygiene and importance of hydration, choosing to drink a sip of water or swallow instead of throat clearing or coughing as well as strict adherence to vocal hygiene recommendations for optimal vocal health. .     Assessment / Recommendations / Plan   Plan  Continue with current plan of care      Progression Toward Goals   Progression toward goals  Progressing toward goals       SLP Education - 03/27/19 1608    Education Details  re: vocal hygiene, diaphragmatic breathing, resonant voice techniques to improve vocal clarity and reduce vocal tension & strain, including "reset" words    Person(s) Educated  Patient;Spouse    Methods  Explanation;Demonstration;Verbal cues;Handout    Comprehension  Verbalized understanding;Need further instruction;Returned demonstration;Verbal cues required         SLP Long Term Goals - 03/13/19 1706      SLP LONG TERM GOAL #1   Title  Pt will minimize vocal tension via resonant voice therapy (or comparable technique) with min SLP cues with 80% accuracy.    Time  8    Period  Weeks    Status  New     Target Date  05/14/19      SLP LONG TERM GOAL #2   Title  Pt will be independent for breath support and extrinsic laryngeal/cervical exercises.    Time  8    Period  Weeks    Status  New    Target Date  05/14/19      SLP LONG TERM GOAL #3   Title  Pt will demonstrate independent understanding  and use of vocal hygiene concepts.    Time  8    Period  Weeks  Status  New    Target Date  05/14/19      SLP LONG TERM GOAL #4   Title  Pt will maintain relaxed phonation and forward resonance for a paragraph length recitation with 80% accuracy.    Time  8    Period  Weeks    Status  New    Target Date  05/14/19       Plan - 03/27/19 1610    Clinical Impression Statement Pt demonstrated improved self-awareness of forward resonance and vocal quality first time this session.Pt beginning to become self aware of when to use compensatory strategies to "reset" voice and decrease hoarseness .Pt often requires repetition of tx instructions/cues and a visual model/demo prior to each stimulus item to perform with accuracy and improved vocal clarity. Rec continued skilled ST services for education and practice of resonant voice techniques and compensatory strategies to decrease hoarseness and strain and promote a healthy voice   Speech Therapy Frequency  2x / week    Duration  Other (comment)   8 weeks   Treatment/Interventions  SLP instruction and feedback;Compensatory strategies;Patient/family education;Functional tasks    Potential to Achieve Goals  Good    Potential Considerations  Ability to learn/carryover information;Family/community support;Previous level of function;Cooperation/participation level    SLP Home Exercise Plan  Diaphragmatic breathing and extrinsic laryngeal/cervical stretches, practice strict adherence to vocal hygiene, improve hydration and reduce/eliminate habitual abusive throat clearing, resonant voice tx techniques/exercises    Consulted and Agree with Plan of Care   Patient;Family member/caregiver    Family Member Consulted  husband       Patient will benefit from skilled therapeutic intervention in order to improve the following deficits and impairments:   1. Dysphonia       Problem List Patient Active Problem List   Diagnosis Date Noted  . Carotid stenosis 12/26/2017  . TIA (transient ischemic attack) 11/16/2017  . Acute cerebrovascular accident (CVA) ([place]) 11/16/2017  . Carcinoma of overlapping sites of left breast in female, estrogen receptor positive ([place]) 11/17/2016  . Symptomatic carotid artery stenosis without infarction 09/11/2016  . H/O appendicitis 08/01/2016  . Thyromegaly 12/22/2015  . Chest pain 05/14/2015  . Arteriosclerosis of coronary artery 07/22/2014  . Disorder of mitral valve 01/17/2014  . Absolute anemia 10/23/2013  . Benign hypertension 10/23/2013  . Dermatophytic onychia 10/23/2013  . HLD (hyperlipidemia) 10/23/2013  . Idiopathic peripheral neuropathy 10/23/2013    Rulon Eisenmenger, MA, CCC-SLP 03/27/2019, 4:20 PM  [place] MAIN [facility] SERVICES [address] [place], [place], [address] Phone: [phone]   Fax:  [phone]   Name: Kathleen Walls MRN: [id number] Date of Birth: 1941/04/11

## 2019-04-03 ENCOUNTER — Ambulatory Visit: Payer: Medicare Other | Admitting: Speech Pathology

## 2019-04-03 ENCOUNTER — Other Ambulatory Visit: Payer: Self-pay

## 2019-04-03 DIAGNOSIS — R490 Dysphonia: Secondary | ICD-10-CM

## 2019-04-03 NOTE — Therapy (Signed)
[place] MAIN [facility] SERVICES [address] [place], [place], [address] Phone: [phone]   Fax:  [phone]  Speech Language Pathology Treatment  Patient Details  Name: Kathleen Walls MRN: [id number] Date of Birth: [birth year] No data recorded  Encounter Date: 04/03/2019  End of Session - 04/03/19 1621    Visit Number  6    Number of Visits  17    Date for SLP Re-Evaluation  05/14/19    Authorization Time Period  5/10 tx until Medicare progress note    SLP Start Time  1502    SLP Stop Time   1558    SLP Time Calculation (min)  56 min    Activity Tolerance  Patient tolerated treatment well       Past Medical History:  Diagnosis Date  . Arthritis   . Breast cancer [facility]) 2009    2009 breast cancer stage IIA T2 N0, ER positive, HER-2 negative, 2.5 cm primary tumor, poorly differentiated, status post wide local excision, one sentinel lymph node negative, high-risk Oncotype score 48  . Cataracts, both eyes   . Colitis   . Complication of anesthesia    confusion  . Coronary artery disease   . Detached vitreous humor   . Diverticulitis   . 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 Gastritis   . GERD (gastroesophageal reflux disease)   . 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 Hemorrhoid   . Hemorrhoids   . History of kidney stones   . Hyperlipemia   . 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 Kidney cysts    Left  . Kidney stones   . Lipoma of shoulder 2009   left  . Migraine   . Osteoarthritis   . Osteopenia   . Stented coronary artery   . Stroke [facility])     Past Surgical History:  Procedure Laterality Date  . ABDOMINAL HYSTERECTOMY    . benign breast biopsy  2007  . BREAST BIOPSY Left 2007   positive  . BREAST CYST ASPIRATION Right    neg  . BREAST LUMPECTOMY Left 2009    2009 breast cancer stage IIA T2 N0, ER positive, HER-2 negative, 2.5 cm primary tumor, poorly differentiated,  status post wide local excision, one sentinel lymph node negative, high-risk Oncotype score 48  . BREAST SURGERY    . CARDIAC CATHETERIZATION N/A 06/22/2015   Procedure: Left Heart Cath and Coronary Angiography;  Surgeon: Teodoro Spray, MD;  Location: [place] [facility];  Service: Cardiovascular;  Laterality: N/A;  . CARDIAC CATHETERIZATION    . CATARACT EXTRACTION    . COLONOSCOPY    . CORONARY ANGIOPLASTY    . ENDARTERECTOMY Right 12/26/2017   Procedure: ENDARTERECTOMY CAROTID;  Surgeon: Katha Cabal, MD;  Location: [facility] ORS;  Service: Vascular;  Laterality: Right;  . EYE SURGERY    . FOOT FRACTURE SURGERY Left   . PARTIAL HYSTERECTOMY    . UPPER GASTROINTESTINAL ENDOSCOPY      There were no vitals filed for this visit.  Subjective Assessment - 04/03/19 1620    Subjective  Pt was very pleasant and cooperative, often required repetition of directions and visual model to complete tasks with accuracy. Husband present and supportive.    Currently in Pain?  No/denies            ADULT SLP TREATMENT - 04/03/19 0001  General Information   Behavior/Cognition  Alert;Cooperative;Pleasant mood    Patient Positioning  Upright in chair    Oral care provided  N/A    HPI  Per ENT evaluation 02/20/2019:  This is a 78 y/o female seen for chief complaint of hoarseness. She has hoarseness that is described as scratchy voice and miild-mod severe with no difficulty swallowing or no throat pain.  The hoarseness has been present for 3 weeks. She feels she is hoarse all the time. No hx of reflux. Pt seeking voice tx to improve vocal quality.      Treatment Provided   Treatment provided  Cognitive-Linquistic      Pain Assessment   Pain Assessment  No/denies pain      Cognitive-Linquistic Treatment   Treatment focused on  Voice    Skilled Treatment  Pt performed SCM release L & R x2 each with 10 second hold, and gentle laryngeal tractioning down x10 with accuracy given visual, verbal and  tactile cues.   Patient performed following resonant voice exercises to decrease laryngeal strain and promote optimal vocal quality: Sustained /m/ hum x10 given intermittent min verbal cues for inhale thru nose prior to initiating phonation which resulted in improved vocal clarity.  Hum+vowels x2 sets given intermittent verbal cues for sustained hum prior to producing vowel and overarticulation  Hum+siren x4 given min verbal and visual cues for sustained hum prior to producing vowel and overarticulation  Hum+ initial /m/ words x10 given min verbal and visual cues for overarticulation and pacing.  Patient recited DOW, MOY, connecting each production given min to mod verbal cues for overarticulation, pacing and increased vocal intensity to improve vocal clarity and decrease hoarseness.  Patient read 5 word initial nasal sentences with increased vocal intensity and forward resonance with 100% accuracy given intermittent verbal cues for pacing and overarticulation.  Pt read 8-10 word sentences aloud with improved vocal clarity and intensity given min to mod verbal cues for hum prior to initiating reading sentence, for pacing, and overarticulation.  Reinforced pt education re: vocal hygiene and importance of hydration, compensatory strategy choices: hum before initiating speech, taking regular sips of water, use of "reset" words to increase forward resonance and decrease vocal strain.      Assessment / Recommendations / Plan   Plan  Continue with current plan of care      Progression Toward Goals   Progression toward goals  Progressing toward goals       SLP Education - 04/03/19 1620    Education Details  re: vocal hygiene, diaphragmatic breathing, resonant voice techniques to improve vocal clarity and reduce vocal tension & strain, including "reset" words    Person(s) Educated  Patient;Spouse    Methods  Explanation;Demonstration;Verbal cues    Comprehension  Verbalized  understanding;Returned demonstration;Need further instruction;Verbal cues required         SLP Long Term Goals - 03/13/19 1706      SLP LONG TERM GOAL #1   Title  Pt will minimize vocal tension via resonant voice therapy (or comparable technique) with min SLP cues with 80% accuracy.    Time  8    Period  Weeks    Status  New    Target Date  05/14/19      SLP LONG TERM GOAL #2   Title  Pt will be independent for breath support and extrinsic laryngeal/cervical exercises.    Time  8    Period  Weeks    Status  New  Target Date  05/14/19      SLP LONG TERM GOAL #3   Title  Pt will demonstrate independent understanding  and use of vocal hygiene concepts.    Time  8    Period  Weeks    Status  New    Target Date  05/14/19      SLP LONG TERM GOAL #4   Title  Pt will maintain relaxed phonation and forward resonance for a paragraph length recitation with 80% accuracy.    Time  8    Period  Weeks    Status  New    Target Date  05/14/19       Plan - 04/03/19 1622    Clinical Impression Statement  Pt demonstrated improved vocal clarity without strain and decreased hoarseness throughout tx session. Pt & husband report consistent home practice of HEP. Pt easily applied resonant voice techniques with accuracy this session given min verbal and visual cues up to the 8-10 word sentence level. Cues for pacing were most effective for lengthier productions. Pt continues to require skilled ST services for education and practice of resonant voice techniques and compensatory strategies to decrease hoarseness and strain and promote a healthy voice.   Speech Therapy Frequency  2x / week    Duration  Other (comment)   8 weeks   Treatment/Interventions  SLP instruction and feedback;Compensatory strategies;Patient/family education;Functional tasks    Potential to Achieve Goals  Good    Potential Considerations  Ability to learn/carryover information;Family/community support;Previous level of  function;Cooperation/participation level    SLP Home Exercise Plan  Diaphragmatic breathing and extrinsic laryngeal/cervical stretches, practice strict adherence to vocal hygiene, improve hydration and reduce/eliminate habitual abusive throat clearing, resonant voice tx techniques/exercises    Consulted and Agree with Plan of Care  Patient;Family member/caregiver    Family Member Consulted  husband       Patient will benefit from skilled therapeutic intervention in order to improve the following deficits and impairments:   Dysphonia    Problem List Patient Active Problem List   Diagnosis Date Noted  . Carotid stenosis 12/26/2017  . TIA (transient ischemic attack) 11/16/2017  . Acute cerebrovascular accident (CVA) ([place]) 11/16/2017  . Carcinoma of overlapping sites of left breast in female, estrogen receptor positive ([place]) 11/17/2016  . Symptomatic carotid artery stenosis without infarction 09/11/2016  . H/O appendicitis 08/01/2016  . Thyromegaly 12/22/2015  . Chest pain 05/14/2015  . Arteriosclerosis of coronary artery 07/22/2014  . Disorder of mitral valve 01/17/2014  . Absolute anemia 10/23/2013  . Benign hypertension 10/23/2013  . Dermatophytic onychia 10/23/2013  . HLD (hyperlipidemia) 10/23/2013  . Idiopathic peripheral neuropathy 10/23/2013    Rulon Eisenmenger, MA, CCC-SLP 04/03/2019, 4:23 PM  [place] MAIN [facility] SERVICES [address] [place], [place], [address] Phone: [phone]   Fax:  [phone]   Name: Kathleen Walls MRN: [id number] Date of Birth: 13-Jun-1941

## 2019-04-04 DIAGNOSIS — E7800 Pure hypercholesterolemia, unspecified: Secondary | ICD-10-CM | POA: Insufficient documentation

## 2019-04-07 ENCOUNTER — Ambulatory Visit: Payer: Medicare Other | Admitting: Speech Pathology

## 2019-04-07 ENCOUNTER — Other Ambulatory Visit: Payer: Self-pay

## 2019-04-07 ENCOUNTER — Encounter: Payer: Self-pay | Admitting: Speech Pathology

## 2019-04-07 DIAGNOSIS — R490 Dysphonia: Secondary | ICD-10-CM | POA: Diagnosis not present

## 2019-04-07 NOTE — Therapy (Signed)
[place] MAIN [facility] SERVICES [address] [place], [place], [address] Phone: [phone]   Fax:  [phone]  Speech Language Pathology Treatment  Patient Details  Name: Kathleen Walls MRN: [id number] Date of Birth: [birth year] No data recorded  Encounter Date: 04/07/2019  End of Session - 04/07/19 1604    Visit Number  7    Number of Visits  17    Date for SLP Re-Evaluation  05/14/19    Authorization Time Period  6/10 tx until Medicare progress note    SLP Start Time  1501    SLP Stop Time   1550    SLP Time Calculation (min)  49 min    Activity Tolerance  Patient tolerated treatment well       Past Medical History:  Diagnosis Date  . Arthritis   . Breast cancer [facility]) 2009    2009 breast cancer stage IIA T2 N0, ER positive, HER-2 negative, 2.5 cm primary tumor, poorly differentiated, status post wide local excision, one sentinel lymph node negative, high-risk Oncotype score 48  . Cataracts, both eyes   . Colitis   . Complication of anesthesia    confusion  . Coronary artery disease   . Detached vitreous humor   . Diverticulitis   . 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 Gastritis   . GERD (gastroesophageal reflux disease)   . 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 Hemorrhoid   . Hemorrhoids   . History of kidney stones   . Hyperlipemia   . 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 Kidney cysts    Left  . Kidney stones   . Lipoma of shoulder 2009   left  . Migraine   . Osteoarthritis   . Osteopenia   . Stented coronary artery   . Stroke [facility])     Past Surgical History:  Procedure Laterality Date  . ABDOMINAL HYSTERECTOMY    . benign breast biopsy  2007  . BREAST BIOPSY Left 2007   positive  . BREAST CYST ASPIRATION Right    neg  . BREAST LUMPECTOMY Left 2009    2009 breast cancer stage IIA T2 N0, ER positive, HER-2 negative, 2.5 cm primary tumor, poorly differentiated,  status post wide local excision, one sentinel lymph node negative, high-risk Oncotype score 48  . BREAST SURGERY    . CARDIAC CATHETERIZATION N/A 06/22/2015   Procedure: Left Heart Cath and Coronary Angiography;  Surgeon: Teodoro Spray, MD;  Location: [place] [facility];  Service: Cardiovascular;  Laterality: N/A;  . CARDIAC CATHETERIZATION    . CATARACT EXTRACTION    . COLONOSCOPY    . CORONARY ANGIOPLASTY    . ENDARTERECTOMY Right 12/26/2017   Procedure: ENDARTERECTOMY CAROTID;  Surgeon: Katha Cabal, MD;  Location: [facility] ORS;  Service: Vascular;  Laterality: Right;  . EYE SURGERY    . FOOT FRACTURE SURGERY Left   . PARTIAL HYSTERECTOMY    . UPPER GASTROINTESTINAL ENDOSCOPY      There were no vitals filed for this visit.  Subjective Assessment - 04/07/19 1501    Subjective  Pt was very pleasant and cooperative, appearing to enjoy tx, smiling often. Husband present and supportive.    Patient is accompained by:  Family member   husband   Currently in Pain?  No/denies            ADULT SLP TREATMENT - 04/07/19  0001      General Information   Behavior/Cognition  Alert;Cooperative;Pleasant mood    Patient Positioning  Upright in chair    Oral care provided  N/A    HPI  Per ENT evaluation 02/20/2019:  This is a 78 y/o female seen for chief complaint of hoarseness. She has hoarseness that is described as scratchy voice and miild-mod severe with no difficulty swallowing or no throat pain.  The hoarseness has been present for 3 weeks. She feels she is hoarse all the time. No hx of reflux. Pt seeking voice tx to improve vocal quality.      Treatment Provided   Treatment provided  Cognitive-Linquistic      Pain Assessment   Pain Assessment  No/denies pain      Cognitive-Linquistic Treatment   Treatment focused on  Voice    Skilled Treatment  Patient performed following resonant voice exercises to decrease laryngeal strain and promote optimal vocal quality: Sustained /m/ hum  x10 given intermittent min verbal cues for inhale thru nose prior to initiating phonation which resulted in improved vocal clarity.  Hum+siren x8 given min verbal and visual cues for sustained hum prior to producing vowel and overarticulation Hum+ initial /m/ words x30 given min verbal and visual cues for overarticulation and pacing.  Pt sang "Twinkle, twinkle" with improved vocal clarity, given verbal and visual cues for pacing and overarticulation.  Patient read 5 word initial nasal sentences with increased vocal intensity and forward resonance with 90% accuracy given intermittent verbal cues for pacing and overarticulation.  Pt read 6-8 word sentences aloud with improved vocal clarity and intensity given min to mod verbal cues for hum prior to initiating reading sentence, for pacing, and overarticulation.  Reinforced pt education re: vocal hygiene and importance of hydration, compensatory strategy choices: hum before initiating speech, taking regular sips of water, use of "reset" words to increase forward resonance and decrease vocal strain.      Assessment / Recommendations / Plan   Plan  Continue with current plan of care      Progression Toward Goals   Progression toward goals  Progressing toward goals       SLP Education - 04/07/19 1603    Education Details  (S) re: vocal hygiene, diaphragmatic breathing, resonant voice techniquest to improve vocal clarity & reduce vocal tension & strain, including "reset" wor    Person(s) Educated  Patient;Spouse    Methods  Explanation;Demonstration;Verbal cues;Handout    Comprehension  Verbalized understanding;Returned demonstration;Need further instruction;Verbal cues required         SLP Long Term Goals - 03/13/19 1706      SLP LONG TERM GOAL #1   Title  Pt will minimize vocal tension via resonant voice therapy (or comparable technique) with min SLP cues with 80% accuracy.    Time  8    Period  Weeks    Status  New    Target Date   05/14/19      SLP LONG TERM GOAL #2   Title  Pt will be independent for breath support and extrinsic laryngeal/cervical exercises.    Time  8    Period  Weeks    Status  New    Target Date  05/14/19      SLP LONG TERM GOAL #3   Title  Pt will demonstrate independent understanding  and use of vocal hygiene concepts.    Time  8    Period  Weeks    Status  New  Target Date  05/14/19      SLP LONG TERM GOAL #4   Title  Pt will maintain relaxed phonation and forward resonance for a paragraph length recitation with 80% accuracy.    Time  8    Period  Weeks    Status  New    Target Date  05/14/19       Plan - 04/07/19 1605    Clinical Impression Statement Pt demonstrated improved vocal clarity using compensatory strategies to decrease strain throughout tx   Pt  continues to require skilled ST services for education and practice of resonant voice techniques and compensatory strategies to decrease hoarseness and strain and promote a healthy voice.   Speech Therapy Frequency  2x / week    Duration  Other (comment)   8 weeks   Treatment/Interventions  SLP instruction and feedback;Compensatory strategies;Patient/family education;Functional tasks    Potential to Achieve Goals  Good    Potential Considerations  Ability to learn/carryover information;Family/community support;Previous level of function;Cooperation/participation level    SLP Home Exercise Plan  Diaphragmatic breathing and extrinsic laryngeal/cervical stretches, practice strict adherence to vocal hygiene, improve hydration and reduce/eliminate habitual abusive throat clearing, resonant voice tx techniques/exercises    Consulted and Agree with Plan of Care  Patient;Family member/caregiver    Family Member Consulted  husband       Patient will benefit from skilled therapeutic intervention in order to improve the following deficits and impairments:   Dysphonia    Problem List Patient Active Problem List   Diagnosis Date  Noted  . Carotid stenosis 12/26/2017  . TIA (transient ischemic attack) 11/16/2017  . Acute cerebrovascular accident (CVA) ([place]) 11/16/2017  . Carcinoma of overlapping sites of left breast in female, estrogen receptor positive ([place]) 11/17/2016  . Symptomatic carotid artery stenosis without infarction 09/11/2016  . H/O appendicitis 08/01/2016  . Thyromegaly 12/22/2015  . Chest pain 05/14/2015  . Arteriosclerosis of coronary artery 07/22/2014  . Disorder of mitral valve 01/17/2014  . Absolute anemia 10/23/2013  . Benign hypertension 10/23/2013  . Dermatophytic onychia 10/23/2013  . HLD (hyperlipidemia) 10/23/2013  . Idiopathic peripheral neuropathy 10/23/2013    Rulon Eisenmenger, MA, CCC-SLP 04/07/2019, 4:29 PM  [place] MAIN [facility] SERVICES [address] [place], [place], [address] Phone: [phone]   Fax:  [phone]   Name: Kathleen Walls MRN: [id number] Date of Birth: October 14, 1940

## 2019-04-10 ENCOUNTER — Ambulatory Visit: Payer: Medicare Other | Admitting: Speech Pathology

## 2019-04-10 ENCOUNTER — Other Ambulatory Visit: Payer: Self-pay

## 2019-04-10 ENCOUNTER — Encounter: Payer: Self-pay | Admitting: Speech Pathology

## 2019-04-10 DIAGNOSIS — R490 Dysphonia: Secondary | ICD-10-CM | POA: Diagnosis not present

## 2019-04-10 NOTE — Therapy (Signed)
[place] MAIN [facility] SERVICES [address] [place], [place], [address] Phone: [phone]   Fax:  [phone]  Speech Language Pathology Treatment  Patient Details  Name: Kathleen Walls MRN: [id number] Date of Birth: 20-May-1941 No data recorded  Encounter Date: 04/10/2019  End of Session - 04/10/19 1602    Visit Number  8    Number of Visits  17    Date for SLP Re-Evaluation  05/14/19    Authorization Time Period  7/10 tx until Medicare progress note    SLP Start Time  1450    SLP Stop Time   1550    SLP Time Calculation (min)  60 min    Activity Tolerance  Patient tolerated treatment well       Past Medical History:  Diagnosis Date  . Arthritis   . Breast cancer [facility]) 2009    2009 breast cancer stage IIA T2 N0, ER positive, HER-2 negative, 2.5 cm primary tumor, poorly differentiated, status post wide local excision, one sentinel lymph node negative, high-risk Oncotype score 48  . Cataracts, both eyes   . Colitis   . Complication of anesthesia    confusion  . Coronary artery disease   . Detached vitreous humor   . Diverticulitis   . 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 Gastritis   . GERD (gastroesophageal reflux disease)   . 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 Hemorrhoid   . Hemorrhoids   . History of kidney stones   . Hyperlipemia   . 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 Kidney cysts    Left  . Kidney stones   . Lipoma of shoulder 2009   left  . Migraine   . Osteoarthritis   . Osteopenia   . Stented coronary artery   . Stroke [facility])     Past Surgical History:  Procedure Laterality Date  . ABDOMINAL HYSTERECTOMY    . benign breast biopsy  2007  . BREAST BIOPSY Left 2007   positive  . BREAST CYST ASPIRATION Right    neg  . BREAST LUMPECTOMY Left 2009    2009 breast cancer stage IIA T2 N0, ER positive, HER-2 negative, 2.5 cm primary tumor, poorly differentiated,  status post wide local excision, one sentinel lymph node negative, high-risk Oncotype score 48  . BREAST SURGERY    . CARDIAC CATHETERIZATION N/A 06/22/2015   Procedure: Left Heart Cath and Coronary Angiography;  Surgeon: Teodoro Spray, MD;  Location: [place] CV LAB;  Service: Cardiovascular;  Laterality: N/A;  . CARDIAC CATHETERIZATION    . CATARACT EXTRACTION    . COLONOSCOPY    . CORONARY ANGIOPLASTY    . ENDARTERECTOMY Right 12/26/2017   Procedure: ENDARTERECTOMY CAROTID;  Surgeon: Katha Cabal, MD;  Location: [facility] ORS;  Service: Vascular;  Laterality: Right;  . EYE SURGERY    . FOOT FRACTURE SURGERY Left   . PARTIAL HYSTERECTOMY    . UPPER GASTROINTESTINAL ENDOSCOPY      There were no vitals filed for this visit.  Subjective Assessment - 04/10/19 1455    Subjective  Pt was very pleasant and cooperative, appearing to enjoy tx, smiling often. Husband present and supportive.    Currently in Pain?  No/denies            ADULT SLP TREATMENT - 04/10/19 0001      General Information   Behavior/Cognition  Alert;Cooperative;Pleasant mood    Patient Positioning  Upright in chair    Oral care provided  N/A    HPI  Per ENT evaluation 02/20/2019:  This is a 78 y/o female seen for chief complaint of hoarseness. She has hoarseness that is described as scratchy voice and miild-mod severe with no difficulty swallowing or no throat pain.  The hoarseness has been present for 3 weeks. She feels she is hoarse all the time. No hx of reflux. Pt seeking voice tx to improve vocal quality.      Treatment Provided   Treatment provided  Cognitive-Linquistic      Pain Assessment   Pain Assessment  No/denies pain      Cognitive-Linquistic Treatment   Treatment focused on  Voice    Skilled Treatment  Patient performed following resonant voice exercises to decrease laryngeal strain and promote optimal vocal quality:  Sustained /m/ hum x5 given intermittent min verbal cues for inhale thru  nose prior to initiating phonation which resulted in improved vocal clarity.  Hum+siren x3 given min verbal and visual cues for sustained hum prior to producing vowel and overarticulation  Hum+vowels x10 sets given min verbal cues for sustained hum prior to producing vowel and overarticulation  Hum+ initial /m/ words x20 given min verbal and visual cues for overarticulation and pacing.   Patient read 5-6 word sentences with no evidence of vocal strain with 90% accuracy given min verbal cues for pacing and overarticulation.  Pt produced sentences given pictured objects with improved vocal clarity as seen in decreased vocal tension with 90% accuracy given min verbal cues for intermittent "reset hum," and overarticulation.  Reinforced pt education re: vocal hygiene and importance of hydration, compensatory strategy choices: hum before initiating speech, taking regular sips of water, use of "reset" words to increase forward resonance and decrease vocal strain, and healthy voice alternatives to throat clearing.       Assessment / Recommendations / Plan   Plan  Continue with current plan of care      Progression Toward Goals   Progression toward goals  Progressing toward goals       SLP Education - 04/10/19 1601    Education Details  re: vocal hygiene, diaphragmatic breathing, resonant voice techniques to improve vocal clarity & reduce vocal tension & strain, including reset words    Person(s) Educated  Patient;Spouse    Methods  Explanation;Demonstration;Verbal cues;Handout    Comprehension  Verbalized understanding;Need further instruction;Returned demonstration;Verbal cues required         SLP Long Term Goals - 03/13/19 1706      SLP LONG TERM GOAL #1   Title  Pt will minimize vocal tension via resonant voice therapy (or comparable technique) with min SLP cues with 80% accuracy.    Time  8    Period  Weeks    Status  New    Target Date  05/14/19      SLP LONG TERM GOAL #2    Title  Pt will be independent for breath support and extrinsic laryngeal/cervical exercises.    Time  8    Period  Weeks    Status  New    Target Date  05/14/19      SLP LONG TERM GOAL #3   Title  Pt will demonstrate independent understanding  and use of vocal hygiene concepts.    Time  8    Period  Weeks    Status  New    Target Date  05/14/19      SLP LONG TERM GOAL #4   Title  Pt will maintain relaxed phonation and forward resonance for a paragraph length recitation with 80% accuracy.    Time  8    Period  Weeks    Status  New    Target Date  05/14/19       Plan - 04/10/19 1602    Clinical Impression Statement Noted improved vocal clarity throughout spontaneous speech tasks vs reading tasks. Pt demonstrated emerging independence using "reset hum" to decrease vocal strain throughout tx  Provided education & reinforcement re: healthy voice alternatives to throat clearing. Pt continues to require skilled ST services for education and practice of resonant voice techniques and compensatory strategies to decrease hoarseness and strain and promote a healthy voice.   Speech Therapy Frequency  2x / week    Duration  Other (comment)   8 weeks   Treatment/Interventions  SLP instruction and feedback;Compensatory strategies;Patient/family education;Functional tasks    Potential to Achieve Goals  Good    Potential Considerations  Ability to learn/carryover information;Family/community support;Previous level of function;Cooperation/participation level    SLP Home Exercise Plan  Diaphragmatic breathing and extrinsic laryngeal/cervical stretches, practice strict adherence to vocal hygiene, improve hydration and reduce/eliminate habitual abusive throat clearing, resonant voice tx techniques/exercises    Consulted and Agree with Plan of Care  Patient;Family member/caregiver    Family Member Consulted  husband       Patient will benefit from skilled therapeutic intervention in order to improve the  following deficits and impairments:   Dysphonia    Problem List Patient Active Problem List   Diagnosis Date Noted  . Carotid stenosis 12/26/2017  . TIA (transient ischemic attack) 11/16/2017  . Acute cerebrovascular accident (CVA) ([place]) 11/16/2017  . Carcinoma of overlapping sites of left breast in female, estrogen receptor positive ([place]) 11/17/2016  . Symptomatic carotid artery stenosis without infarction 09/11/2016  . H/O appendicitis 08/01/2016  . Thyromegaly 12/22/2015  . Chest pain 05/14/2015  . Arteriosclerosis of coronary artery 07/22/2014  . Disorder of mitral valve 01/17/2014  . Absolute anemia 10/23/2013  . Benign hypertension 10/23/2013  . Dermatophytic onychia 10/23/2013  . HLD (hyperlipidemia) 10/23/2013  . Idiopathic peripheral neuropathy 10/23/2013    Rulon Eisenmenger, MA, CCC-SLP 04/10/2019, 4:17 PM  [place] MAIN [facility] SERVICES [address] [place], [place], [address] Phone: [phone]   Fax:  [phone]   Name: DARBI CHANDRAN MRN: [id number] Date of Birth: 06-21-1941

## 2019-04-14 ENCOUNTER — Ambulatory Visit: Payer: Medicare Other | Admitting: Speech Pathology

## 2019-04-14 ENCOUNTER — Other Ambulatory Visit: Payer: Self-pay

## 2019-04-14 ENCOUNTER — Encounter: Payer: Self-pay | Admitting: Speech Pathology

## 2019-04-14 DIAGNOSIS — R490 Dysphonia: Secondary | ICD-10-CM | POA: Diagnosis not present

## 2019-04-14 NOTE — Therapy (Signed)
[place] MAIN [facility] SERVICES [address] [place], [place], [address] Phone: [phone]   Fax:  [phone]  Speech Language Pathology Treatment  Patient Details  Name: Kathleen Walls MRN: [id number] Date of Birth: 03-27-1941 No data recorded  Encounter Date: 04/14/2019  End of Session - 04/14/19 1636    Visit Number  9    Number of Visits  17    Date for SLP Re-Evaluation  05/14/19    Authorization Time Period  8/10 tx until Medicare progress note    SLP Start Time  1500    SLP Stop Time   1549    SLP Time Calculation (min)  49 min    Activity Tolerance  Patient tolerated treatment well       Past Medical History:  Diagnosis Date  . Arthritis   . Breast cancer [facility]) 2009    2009 breast cancer stage IIA T2 N0, ER positive, HER-2 negative, 2.5 cm primary tumor, poorly differentiated, status post wide local excision, one sentinel lymph node negative, high-risk Oncotype score 48  . Cataracts, both eyes   . Colitis   . Complication of anesthesia    confusion  . Coronary artery disease   . Detached vitreous humor   . Diverticulitis   . 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 Gastritis   . GERD (gastroesophageal reflux disease)   . 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 Hemorrhoid   . Hemorrhoids   . History of kidney stones   . Hyperlipemia   . 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 Kidney cysts    Left  . Kidney stones   . Lipoma of shoulder 2009   left  . Migraine   . Osteoarthritis   . Osteopenia   . Stented coronary artery   . Stroke [facility])     Past Surgical History:  Procedure Laterality Date  . ABDOMINAL HYSTERECTOMY    . benign breast biopsy  2007  . BREAST BIOPSY Left 2007   positive  . BREAST CYST ASPIRATION Right    neg  . BREAST LUMPECTOMY Left 2009    2009 breast cancer stage IIA T2 N0, ER positive, HER-2 negative, 2.5 cm primary tumor, poorly differentiated,  status post wide local excision, one sentinel lymph node negative, high-risk Oncotype score 48  . BREAST SURGERY    . CARDIAC CATHETERIZATION N/A 06/22/2015   Procedure: Left Heart Cath and Coronary Angiography;  Surgeon: Teodoro Spray, MD;  Location: [place] CV LAB;  Service: Cardiovascular;  Laterality: N/A;  . CARDIAC CATHETERIZATION    . CATARACT EXTRACTION    . COLONOSCOPY    . CORONARY ANGIOPLASTY    . ENDARTERECTOMY Right 12/26/2017   Procedure: ENDARTERECTOMY CAROTID;  Surgeon: Katha Cabal, MD;  Location: [facility] ORS;  Service: Vascular;  Laterality: Right;  . EYE SURGERY    . FOOT FRACTURE SURGERY Left   . PARTIAL HYSTERECTOMY    . UPPER GASTROINTESTINAL ENDOSCOPY      There were no vitals filed for this visit.  Subjective Assessment - 04/14/19 1634    Subjective  Pt was very pleasant and cooperative, appearing to enjoy tx, smiling often. Husband present and supportive.    Patient is accompained by:  Family member   husband   Currently in Pain?  No/denies            ADULT SLP TREATMENT - 04/14/19  0001      General Information   Behavior/Cognition  Alert;Cooperative;Pleasant mood    Patient Positioning  Upright in chair    Oral care provided  N/A    HPI  Per ENT evaluation 02/20/2019:  This is a 78 y/o female seen for chief complaint of hoarseness. She has hoarseness that is described as scratchy voice and miild-mod severe with no difficulty swallowing or no throat pain.  The hoarseness has been present for 3 weeks. She feels she is hoarse all the time. No hx of reflux. Pt seeking voice tx to improve vocal quality.      Treatment Provided   Treatment provided  Cognitive-Linquistic      Pain Assessment   Pain Assessment  No/denies pain      Cognitive-Linquistic Treatment   Treatment focused on  Voice    Skilled Treatment  Patient performed following resonant voice exercises to decrease laryngeal strain and promote optimal vocal quality: Sustained /m/ hum  x5 given intermittent min verbal cues for inhale thru nose prior to initiating phonation which resulted in improved vocal clarity. Hum+siren x5 given min verbal and visual cues for sustained hum prior to producing vowel and overarticulation Hum+ initial /m/ words x30 given intermittent verbal and visual cues for overarticulation and pacing.  Patient read 5-6 word sentences with no evidence of vocal strain with 95% accuracy given min verbal cues for use of hum or sip of water when she sensed her vocal quality begin to decrease. Pt read short 2-3 sentence paragraphs aloud with improved vocal clarity as seen in decreased vocal tension with 95% accuracy given occasional verbal cues for intermittent "reset hum," and sip of water. Reinforced pt education re: vocal hygiene and importance of hydration, compensatory strategy choices: hum before initiating speech, taking regular sips of water, use of "reset" words to increase forward resonance and decrease vocal strain, and healthy voice alternatives to throat clearing.        Assessment / Recommendations / Plan   Plan  Continue with current plan of care      Progression Toward Goals   Progression toward goals  Progressing toward goals       SLP Education - 04/14/19 1635    Education Details  re: vocal hygiene, diaphragmatic breathing, resonant voice techniques to improve vocal clarity & reduce vocal tension & strain, including reset words    Person(s) Educated  Patient;Spouse    Methods  Explanation;Demonstration;Verbal cues;Handout    Comprehension  Verbalized understanding;Returned demonstration;Verbal cues required         SLP Long Term Goals - 04/14/19 1638      SLP LONG TERM GOAL #1   Title  Pt will minimize vocal tension via resonant voice therapy (or comparable technique) with min SLP cues with 80% accuracy.    Time  8    Status  Achieved      SLP LONG TERM GOAL #2   Title  Pt will be independent for breath support and extrinsic  laryngeal/cervical exercises.    Time  8    Period  Weeks    Status  On-going      SLP LONG TERM GOAL #3   Title  Pt will demonstrate independent understanding  and use of vocal hygiene concepts.    Time  8    Period  Weeks    Status  On-going   husband is independent with above     SLP LONG TERM GOAL #4   Title  Pt will  maintain relaxed phonation and forward resonance for a paragraph length recitation with 80% accuracy.   short 2-3 sentence paragraps   Time  8    Period  Weeks    Status  Achieved       Plan - 04/14/19 1637    Clinical Impression Statement Noted overall marked improved vocal clarity throughout both spontaneous speech tasks and reading tasks. Pt continues to demonstrate emerging independence using "reset hum" & sip of water to avoid throat clearing and aid continued vocal clarity throughout tx. Provided education & reinforcement re: healthy voice alternatives to throat clearing. Pt to continue to practice voice HEP, employ consistent vocal hygiene and use compensatory to strategies to improve vocal clarity as needed. Pt to be d/c'd from tx today, tx goals largely met, vocal clarity much improved since start of tx. Noted 4-5 mild episodes of hoarseness throughout 50 minutes of tx which were rapidly eliminated with use of "reset hum" or sip of water.   Speech Therapy Frequency  2x / week    Duration  Other (comment)    d/c today   Treatment/Interventions  SLP instruction and feedback;Compensatory strategies;Patient/family education;Functional tasks    Potential to Achieve Goals  Good    Potential Considerations  Ability to learn/carryover information;Family/community support;Previous level of function;Cooperation/participation level    SLP Home Exercise Plan  Diaphragmatic breathing and extrinsic laryngeal/cervical stretches, practice strict adherence to vocal hygiene, improve hydration and reduce/eliminate habitual abusive throat clearing, resonant voice tx  techniques/exercises    Consulted and Agree with Plan of Care  Patient;Family member/caregiver       Patient will benefit from skilled therapeutic intervention in order to improve the following deficits and impairments:   Dysphonia    Problem List Patient Active Problem List   Diagnosis Date Noted  . Carotid stenosis 12/26/2017  . TIA (transient ischemic attack) 11/16/2017  . Acute cerebrovascular accident (CVA) ([place]) 11/16/2017  . Carcinoma of overlapping sites of left breast in female, estrogen receptor positive ([place]) 11/17/2016  . Symptomatic carotid artery stenosis without infarction 09/11/2016  . H/O appendicitis 08/01/2016  . Thyromegaly 12/22/2015  . Chest pain 05/14/2015  . Arteriosclerosis of coronary artery 07/22/2014  . Disorder of mitral valve 01/17/2014  . Absolute anemia 10/23/2013  . Benign hypertension 10/23/2013  . Dermatophytic onychia 10/23/2013  . HLD (hyperlipidemia) 10/23/2013  . Idiopathic peripheral neuropathy 10/23/2013    Rulon Eisenmenger, MA, CCC-SLP 04/14/2019, 4:51 PM  [place] MAIN [facility] SERVICES [address] [place], [place], [address] Phone: [phone]   Fax:  [phone]   Name: KEIANA TAVELLA MRN: [id number] Date of Birth: Oct 10, 1940

## 2019-04-17 ENCOUNTER — Ambulatory Visit: Payer: Medicare Other | Admitting: Speech Pathology

## 2019-04-24 ENCOUNTER — Encounter (INDEPENDENT_AMBULATORY_CARE_PROVIDER_SITE_OTHER): Payer: Medicare Other

## 2019-04-24 ENCOUNTER — Ambulatory Visit: Payer: Medicare Other | Admitting: Speech Pathology

## 2019-04-24 ENCOUNTER — Ambulatory Visit (INDEPENDENT_AMBULATORY_CARE_PROVIDER_SITE_OTHER): Payer: Medicare Other | Admitting: Vascular Surgery

## 2019-04-28 ENCOUNTER — Ambulatory Visit: Payer: Medicare Other | Admitting: Speech Pathology

## 2019-05-01 ENCOUNTER — Encounter (INDEPENDENT_AMBULATORY_CARE_PROVIDER_SITE_OTHER): Payer: Self-pay | Admitting: Vascular Surgery

## 2019-05-01 ENCOUNTER — Ambulatory Visit (INDEPENDENT_AMBULATORY_CARE_PROVIDER_SITE_OTHER): Payer: Medicare Other | Admitting: Vascular Surgery

## 2019-05-01 ENCOUNTER — Ambulatory Visit: Payer: Medicare Other | Admitting: Speech Pathology

## 2019-05-01 ENCOUNTER — Encounter (INDEPENDENT_AMBULATORY_CARE_PROVIDER_SITE_OTHER): Payer: Self-pay

## 2019-05-01 ENCOUNTER — Ambulatory Visit (INDEPENDENT_AMBULATORY_CARE_PROVIDER_SITE_OTHER): Payer: Medicare Other

## 2019-05-01 ENCOUNTER — Other Ambulatory Visit: Payer: Self-pay

## 2019-05-01 VITALS — BP 146/67 | HR 61 | Resp 12 | Ht 62.5 in | Wt 94.0 lb

## 2019-05-01 DIAGNOSIS — I2510 Atherosclerotic heart disease of native coronary artery without angina pectoris: Secondary | ICD-10-CM

## 2019-05-01 DIAGNOSIS — I10 Essential (primary) hypertension: Secondary | ICD-10-CM

## 2019-05-01 DIAGNOSIS — E782 Mixed hyperlipidemia: Secondary | ICD-10-CM

## 2019-05-01 DIAGNOSIS — I6523 Occlusion and stenosis of bilateral carotid arteries: Secondary | ICD-10-CM

## 2019-05-01 NOTE — Progress Notes (Signed)
MRN : [id number]  Kathleen Walls is a 78 y.o. ([birth year]) female who presents with chief complaint of No chief complaint on file. Marland Kitchen  History of Present Illness:   The patient is seen for follow up evaluation of carotid stenosis status post right carotid endarterectomy on 12/26/2017.  There were no post operative problems or complications related to the surgery.  The patient denies neck or incisional pain.  The patient denies interval amaurosis fugax. There is no recent history of TIA symptoms or focal motor deficits. There is no prior documented CVA.  The patient denies headache.  The patient is taking enteric-coated aspirin 81 mg daily.  The patient has a history of coronary artery disease, no recent episodes of angina or shortness of breath. The patient denies PAD or claudication symptoms. There is a history of hyperlipidemia which is being treated with a statin.   Duplex ultrasound of the carotid arteries shows 40-59% ICA stenosis bilaterally  No outpatient medications have been marked as taking for the 05/01/19 encounter (Appointment) with Delana Meyer, Dolores Lory, MD.    Past Medical History:  Diagnosis Date  . Arthritis   . Breast cancer [facility]) 2009    2009 breast cancer stage IIA T2 N0, ER positive, HER-2 negative, 2.5 cm primary tumor, poorly differentiated, status post wide local excision, one sentinel lymph node negative, high-risk Oncotype score 48  . Cataracts, both eyes   . Colitis   . Complication of anesthesia    confusion  . Coronary artery disease   . Detached vitreous humor   . Diverticulitis   . 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 Gastritis   . GERD (gastroesophageal reflux disease)   . 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 Hemorrhoid   . Hemorrhoids   . History of kidney stones   . Hyperlipemia   . 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 Kidney cysts    Left  . Kidney stones   . Lipoma of shoulder 2009    left  . Migraine   . Osteoarthritis   . Osteopenia   . Stented coronary artery   . Stroke [facility])     Past Surgical History:  Procedure Laterality Date  . ABDOMINAL HYSTERECTOMY    . benign breast biopsy  2007  . BREAST BIOPSY Left 2007   positive  . BREAST CYST ASPIRATION Right    neg  . BREAST LUMPECTOMY Left 2009    2009 breast cancer stage IIA T2 N0, ER positive, HER-2 negative, 2.5 cm primary tumor, poorly differentiated, status post wide local excision, one sentinel lymph node negative, high-risk Oncotype score 48  . BREAST SURGERY    . CARDIAC CATHETERIZATION N/A 06/22/2015   Procedure: Left Heart Cath and Coronary Angiography;  Surgeon: Teodoro Spray, MD;  Location: [place] CV LAB;  Service: Cardiovascular;  Laterality: N/A;  . CARDIAC CATHETERIZATION    . CATARACT EXTRACTION    . COLONOSCOPY    . CORONARY ANGIOPLASTY    . ENDARTERECTOMY Right 12/26/2017   Procedure: ENDARTERECTOMY CAROTID;  Surgeon: Katha Cabal, MD;  Location: [facility] ORS;  Service: Vascular;  Laterality: Right;  . EYE SURGERY    . FOOT FRACTURE SURGERY Left   . PARTIAL HYSTERECTOMY    . UPPER GASTROINTESTINAL ENDOSCOPY      Social History Social History   Tobacco Use  . Smoking status: Former Smoker    Types: Cigarettes  Quit date: 08/14/1993    Years since quitting: 25.7  . Smokeless tobacco: Never Used  Substance Use Topics  . Alcohol use: Not Currently    Alcohol/week: 0.0 standard drinks    Comment: rare  . Drug use: No    Family History Family History  Problem Relation Age of Onset  . Heart disease Mother   . Diabetes Father   . Heart disease Father   . Diabetes Sister   . Diabetes Brother   . Cervical cancer Cousin   . Breast cancer Cousin   . Lung cancer Paternal Aunt   . Prostate cancer Maternal Uncle   . Leukemia Other   . Hypertension Other     Allergies  Allergen Reactions  . Alendronate Sodium Other (See Comments)    Unknown  . Atorvastatin Other (See  Comments)    Muscle Pain  . Azithromycin Hives  . Ciprofloxacin Other (See Comments)    Headache  . Colestipol Other (See Comments)    Unknown  . Lidocaine Hives  . Lovastatin Other (See Comments)    Headache  . Metronidazole Other (See Comments)    Headache  . Niacin Other (See Comments)    Unknown  . Risedronate Sodium Other (See Comments)    Unknown  . Rosuvastatin Other (See Comments)    Unknown  . Simvastatin Other (See Comments)    Headache     REVIEW OF SYSTEMS (Negative unless checked)  Constitutional: []Weight loss  []Fever  []Chills Cardiac: []Chest pain   []Chest pressure   []Palpitations   []Shortness of breath when laying flat   []Shortness of breath with exertion. Vascular:  []Pain in legs with walking   []Pain in legs at rest  []History of DVT   []Phlebitis   []Swelling in legs   []Varicose veins   []Non-healing ulcers Pulmonary:   []Uses home oxygen   []Productive cough   []Hemoptysis   []Wheeze  []COPD   []Asthma Neurologic:  []Dizziness   []Seizures   []History of stroke   []History of TIA  []Aphasia   []Vissual changes   []Weakness or numbness in arm   []Weakness or numbness in leg Musculoskeletal:   []Joint swelling   []Joint pain   []Low back pain Hematologic:  []Easy bruising  []Easy bleeding   []Hypercoagulable state   []Anemic Gastrointestinal:  []Diarrhea   []Vomiting  []Gastroesophageal reflux/heartburn   []Difficulty swallowing. Genitourinary:  []Chronic kidney disease   []Difficult urination  []Frequent urination   []Blood in urine Skin:  []Rashes   []Ulcers  Psychological:  []History of anxiety   [] History of major depression.  Physical Examination  There were no vitals filed for this visit. There is no height or weight on file to calculate BMI. Gen: WD/WN, NAD Head: [place]/AT, No temporalis wasting.  Ear/Nose/Throat: Hearing grossly intact, nares w/o erythema or drainage Eyes: PER, EOMI, sclera nonicteric.  Neck: Supple, no large masses.    Pulmonary:  Good air movement, no audible wheezing bilaterally, no use of accessory muscles.  Cardiac: RRR, no JVD Vascular:  Vessel Right Left  Radial Palpable Palpable  Brachial Palpable Palpable  Carotid Palpable Palpable  Gastrointestinal: Non-distended. No guarding/no peritoneal signs.  Musculoskeletal: M/S 5/5 throughout.  No deformity or atrophy.  Neurologic: CN 2-12 intact. Symmetrical.  Speech is fluent. Motor exam as listed above. Psychiatric: Judgment intact, Mood & affect appropriate for pt's clinical situation. Dermatologic: No rashes or ulcers noted.  No changes consistent with cellulitis. Lymph : No lichenification or  skin changes of chronic lymphedema.  CBC Lab Results  Component Value Date   WBC 7.6 11/20/2018   HGB 12.9 11/20/2018   HCT 41.7 11/20/2018   MCV 85.3 11/20/2018   PLT 227 11/20/2018    BMET    Component Value Date/Time   NA 141 11/20/2018 1341   NA 143 12/31/2013 0406   K 4.2 11/20/2018 1341   K 4.5 12/31/2013 0406   CL 104 11/20/2018 1341   CL 111 (H) 12/31/2013 0406   CO2 30 11/20/2018 1341   CO2 28 12/31/2013 0406   GLUCOSE 80 11/20/2018 1341   GLUCOSE 115 (H) 12/31/2013 0406   BUN 16 11/20/2018 1341   BUN 10 12/31/2013 0406   CREATININE 0.87 11/20/2018 1341   CREATININE 0.87 12/31/2013 0406   CALCIUM 9.5 11/20/2018 1341   CALCIUM 8.6 12/31/2013 0406   GFRNONAA >60 11/20/2018 1341   GFRNONAA >60 12/31/2013 0406   GFRAA >60 11/20/2018 1341   GFRAA >60 12/31/2013 0406   CrCl cannot be calculated (Patient's most recent lab result is older than the maximum 21 days allowed.).  COAG Lab Results  Component Value Date   INR 0.99 12/14/2017   INR 0.90 11/16/2017    Radiology No results found.   Assessment/Plan 1. Bilateral carotid artery stenosis Recommend:  Given the patient's asymptomatic subcritical stenosis no further invasive testing or surgery at this time.  Duplex ultrasound of the carotid arteries shows 40-59% ICA  stenosis bilaterally  Continue antiplatelet therapy as prescribed Continue management of CAD, HTN and Hyperlipidemia Healthy heart diet,  encouraged exercise at least 4 times per week Follow up in 12 months with duplex ultrasound and physical exam   2. Arteriosclerosis of coronary artery Continue cardiac and antihypertensive medications as already ordered and reviewed, no changes at this time.  Continue statin as ordered and reviewed, no changes at this time  Nitrates PRN for chest pain   3. Benign hypertension Continue antihypertensive medications as already ordered, these medications have been reviewed and there are no changes at this time.   4. Mixed hyperlipidemia Continue statin as ordered and reviewed, no changes at this time     Hortencia Pilar, MD  05/01/2019 7:54 AM

## 2019-07-17 ENCOUNTER — Other Ambulatory Visit: Payer: Self-pay | Admitting: Internal Medicine

## 2019-07-17 DIAGNOSIS — Z1231 Encounter for screening mammogram for malignant neoplasm of breast: Secondary | ICD-10-CM

## 2019-07-29 DIAGNOSIS — I6523 Occlusion and stenosis of bilateral carotid arteries: Secondary | ICD-10-CM | POA: Insufficient documentation

## 2019-07-29 DIAGNOSIS — Z8673 Personal history of transient ischemic attack (TIA), and cerebral infarction without residual deficits: Secondary | ICD-10-CM | POA: Insufficient documentation

## 2019-08-29 ENCOUNTER — Ambulatory Visit
Admission: RE | Admit: 2019-08-29 | Discharge: 2019-08-29 | Disposition: A | Discharge status: Home / Self Care | Payer: Medicare Other | Source: Ambulatory Visit | Attending: Internal Medicine | Admitting: Internal Medicine

## 2019-08-29 DIAGNOSIS — Z1231 Encounter for screening mammogram for malignant neoplasm of breast: Secondary | ICD-10-CM | POA: Insufficient documentation

## 2019-11-20 ENCOUNTER — Inpatient Hospital Stay: Payer: Medicare Other | Attending: Internal Medicine

## 2019-11-20 ENCOUNTER — Inpatient Hospital Stay: Payer: Medicare Other | Admitting: Internal Medicine

## 2019-11-20 ENCOUNTER — Encounter: Payer: Self-pay | Admitting: Internal Medicine

## 2019-11-20 ENCOUNTER — Other Ambulatory Visit: Payer: Self-pay

## 2019-11-20 DIAGNOSIS — Z170 Estrogen receptor positive status [ER+]: Secondary | ICD-10-CM | POA: Insufficient documentation

## 2019-11-20 DIAGNOSIS — G620 Drug-induced polyneuropathy: Secondary | ICD-10-CM | POA: Diagnosis not present

## 2019-11-20 DIAGNOSIS — C50812 Malignant neoplasm of overlapping sites of left female breast: Secondary | ICD-10-CM | POA: Diagnosis not present

## 2019-11-20 LAB — CBC WITH DIFFERENTIAL/PLATELET
Abs Immature Granulocytes: 0.02 10*3/uL (ref 0.00–0.07)
Basophils Absolute: 0 10*3/uL (ref 0.0–0.1)
Basophils Relative: 1 %
Eosinophils Absolute: 0.1 10*3/uL (ref 0.0–0.5)
Eosinophils Relative: 2 %
HCT: 40.7 % (ref 36.0–46.0)
Hemoglobin: 12.5 g/dL (ref 12.0–15.0)
Immature Granulocytes: 0 %
Lymphocytes Relative: 22 %
Lymphs Abs: 1.3 10*3/uL (ref 0.7–4.0)
MCH: 26.3 pg (ref 26.0–34.0)
MCHC: 30.7 g/dL (ref 30.0–36.0)
MCV: 85.5 fL (ref 80.0–100.0)
Monocytes Absolute: 0.7 10*3/uL (ref 0.1–1.0)
Monocytes Relative: 12 %
Neutro Abs: 3.9 10*3/uL (ref 1.7–7.7)
Neutrophils Relative %: 63 %
Platelets: 190 10*3/uL (ref 150–400)
RBC: 4.76 MIL/uL (ref 3.87–5.11)
RDW: 14.4 % (ref 11.5–15.5)
WBC: 6.1 10*3/uL (ref 4.0–10.5)
nRBC: 0 % (ref 0.0–0.2)

## 2019-11-20 LAB — COMPREHENSIVE METABOLIC PANEL
ALT: 28 U/L (ref 0–44)
AST: 28 U/L (ref 15–41)
Albumin: 3.6 g/dL (ref 3.5–5.0)
Alkaline Phosphatase: 65 U/L (ref 38–126)
Anion gap: 7 (ref 5–15)
BUN: 21 mg/dL (ref 8–23)
CO2: 31 mmol/L (ref 22–32)
Calcium: 9.1 mg/dL (ref 8.9–10.3)
Chloride: 105 mmol/L (ref 98–111)
Creatinine, Ser: 1.05 mg/dL — ABNORMAL HIGH (ref 0.44–1.00)
GFR calc Af Amer: 59 mL/min — ABNORMAL LOW (ref >60)
GFR calc non Af Amer: 51 mL/min — ABNORMAL LOW (ref >60)
Glucose, Bld: 87 mg/dL (ref 70–99)
Potassium: 4.2 mmol/L (ref 3.5–5.1)
Sodium: 143 mmol/L (ref 135–145)
Total Bilirubin: 0.6 mg/dL (ref 0.3–1.2)
Total Protein: 6.3 g/dL — ABNORMAL LOW (ref 6.5–8.1)

## 2019-11-20 NOTE — Progress Notes (Signed)
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  Patient has not had any recent hospitalizations.  No further strokes.  No new lumps or bumps.  No new shortness of the cough.  She is gaining weight otherwise no leg swelling or abdominal distention.    Review of Systems  Constitutional: Positive for malaise/fatigue. Negative for chills, diaphoresis, fever and weight loss.  HENT: Negative for nosebleeds and sore throat.   Eyes: Negative for double vision.  Respiratory: Negative for cough, hemoptysis, sputum production, shortness of breath and wheezing.   Cardiovascular: Negative for chest pain, palpitations, orthopnea and leg swelling.  Gastrointestinal: Negative for abdominal pain, blood in stool, constipation, diarrhea, heartburn, melena, nausea and vomiting.  Genitourinary: Negative for dysuria, frequency and urgency.  Musculoskeletal: Positive for back pain and joint pain.  Skin: Negative.  Negative for itching and rash.  Neurological: Negative for dizziness, tingling, focal weakness, weakness and headaches.  Endo/Heme/Allergies: Does not bruise/bleed easily.  Psychiatric/Behavioral: Negative for depression. The patient is not nervous/anxious and does not have insomnia.      PAST MEDICAL HISTORY :  Past  Medical History:  Diagnosis Date  . Arthritis   . Breast cancer [facility]) 2009    2009 breast cancer stage IIA T2 N0, ER positive, HER-2 negative, 2.5 cm primary tumor, poorly differentiated, status post wide local excision, one sentinel lymph node negative, high-risk Oncotype score 48  . Cataracts, both eyes   . Colitis   . Complication of anesthesia    confusion  . Coronary artery disease   . Detached vitreous humor   . Diverticulitis   . 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 Gastritis   . GERD (gastroesophageal reflux disease)   . 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 Hemorrhoid   . Hemorrhoids   . History of kidney stones   . Hyperlipemia   . 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 Kidney cysts    Left  . Kidney stones   . Lipoma of shoulder 2009   left  . Migraine   . Osteoarthritis   . Osteopenia   . Stented coronary artery   . Stroke [facility])     PAST SURGICAL HISTORY :   Past Surgical History:  Procedure Laterality Date  . ABDOMINAL HYSTERECTOMY    . benign breast biopsy  2007  . BREAST BIOPSY Left 2007   positive  . BREAST CYST ASPIRATION Right    neg  . BREAST LUMPECTOMY Left 2009    2009 breast cancer stage IIA T2 N0, ER positive, HER-2 negative, 2.5 cm primary tumor, poorly differentiated, status post wide local excision, one sentinel lymph node negative, high-risk Oncotype score 48  . BREAST SURGERY    .  CARDIAC CATHETERIZATION N/A 06/22/2015   Procedure: Left Heart Cath and Coronary Angiography;  Surgeon: Teodoro Spray, MD;  Location: [place] CV LAB;  Service: Cardiovascular;  Laterality: N/A;  . CARDIAC CATHETERIZATION    . CATARACT EXTRACTION    . COLONOSCOPY    . CORONARY ANGIOPLASTY    . ENDARTERECTOMY Right 12/26/2017   Procedure: ENDARTERECTOMY CAROTID;  Surgeon: Katha Cabal, MD;  Location: [facility] ORS;  Service: Vascular;  Laterality: Right;  . EYE SURGERY    . FOOT FRACTURE SURGERY Left   .  PARTIAL HYSTERECTOMY    . UPPER GASTROINTESTINAL ENDOSCOPY      FAMILY HISTORY :   Family History  Problem Relation Age of Onset  . Heart disease Mother   . Diabetes Father   . Heart disease Father   . Diabetes Sister   . Diabetes Brother   . Cervical cancer Cousin   . Breast cancer Cousin   . Lung cancer Paternal Aunt   . Prostate cancer Maternal Uncle   . Leukemia Other   . Hypertension Other     SOCIAL HISTORY:   Social History   Tobacco Use  . Smoking status: Former Smoker    Types: Cigarettes    Quit date: 08/14/1993    Years since quitting: 26.2  . Smokeless tobacco: Never Used  Substance Use Topics  . Alcohol use: Not Currently    Alcohol/week: 0.0 standard drinks    Comment: rare  . Drug use: No    ALLERGIES:  is allergic to alendronate sodium; atorvastatin; azithromycin; ciprofloxacin; colestipol; lidocaine; lovastatin; metronidazole; niacin; risedronate sodium; rosuvastatin; and simvastatin.  MEDICATIONS:  Current Outpatient Medications  Medication Sig Dispense Refill  . ALPRAZolam (XANAX) 0.25 MG tablet TAKE ONE-HALF TABLET BY MOUTH AT BEDTIME    . aspirin EC 81 MG tablet Take 81 mg by mouth daily.     . Azelaic Acid 15 % cream Apply 1 application topically daily.     . butalbital-acetaminophen-caffeine (FIORICET, ESGIC) 50-325-40 MG tablet Take 1-2 tablets by mouth daily as needed for headache.    . Cholecalciferol (VITAMIN D3) 5000 units CAPS Take 1 capsule by mouth daily.     Marland Kitchen docusate sodium (STOOL SOFTENER) 100 MG capsule Take 100 mg by mouth daily as needed for mild constipation.     Marland Kitchen donepezil (ARICEPT) 10 MG tablet Take 10 mg by mouth at bedtime.     Marland Kitchen ezetimibe (ZETIA) 10 MG tablet Take 10 mg by mouth daily.     Marland Kitchen gabapentin (NEURONTIN) 300 MG capsule TAKE ONE CAPSULE BY MOUTH THREE TIMES DAILY    . hydroxychloroquine (PLAQUENIL) 200 MG tablet Take 1 tablet by mouth once daily    . ipratropium (ATROVENT) 0.03 % nasal spray USE 2 SPRAYS IN EACH  NOSTRIL THREE TIMES DAILY AS NEEDED FOR DRAINAGE    . metoprolol succinate (TOPROL-XL) 25 MG 24 hr tablet Take 25 mg by mouth daily.     . nitroGLYCERIN (NITROSTAT) 0.4 MG SL tablet Place 0.4 mg under the tongue every 5 (five) minutes as needed for chest pain.    Marland Kitchen omeprazole (PRILOSEC) 20 MG capsule Take 20 mg by mouth 2 (two) times daily before a meal.    . sodium chloride (MURO 128) 5 % ophthalmic solution Place 1 drop into the left eye at bedtime.     Marland Kitchen venlafaxine XR (EFFEXOR-XR) 37.5 MG 24 hr capsule Take 37.5 mg by mouth daily.    Marland Kitchen amLODipine (NORVASC) 2.5 MG tablet Take  by mouth.     No current facility-administered medications for this visit.    PHYSICAL EXAMINATION: ECOG PERFORMANCE STATUS: 0 - Asymptomatic  BP (!) 133/54 (BP Location: Left Arm, Patient Position: Sitting, Cuff Size: Normal)   Pulse (!) 56   Temp (!) 96.3 F (35.7 C) (Tympanic)   Wt 99 lb 12.8 oz (45.3 kg)   BMI 17.96 kg/m   Filed Weights   11/20/19 1024  Weight: 99 lb 12.8 oz (45.3 kg)   Physical Exam  Constitutional: She is oriented to person, place, and time and well-developed, well-nourished, and in no distress.  HENT:  Head: Normocephalic and atraumatic.  Mouth/Throat: Oropharynx is clear and moist. No oropharyngeal exudate.  Eyes: Pupils are equal, round, and reactive to light.  Cardiovascular: Normal rate and regular rhythm.  Pulmonary/Chest: Effort normal and breath sounds normal. No respiratory distress. She has no wheezes.  Abdominal: Soft. Bowel sounds are normal. She exhibits no distension and no mass. There is no abdominal tenderness. There is no rebound and no guarding.  Musculoskeletal:        General: No tenderness or edema. Normal range of motion.     Cervical back: Normal range of motion and neck supple.  Neurological: She is alert and oriented to person, place, and time.  Skin: Skin is warm.  Psychiatric: Affect normal.     LABORATORY DATA:  I have reviewed the data as  listed    Component Value Date/Time   NA 143 11/20/2019 1011   NA 143 12/31/2013 0406   K 4.2 11/20/2019 1011   K 4.5 12/31/2013 0406   CL 105 11/20/2019 1011   CL 111 (H) 12/31/2013 0406   CO2 31 11/20/2019 1011   CO2 28 12/31/2013 0406   GLUCOSE 87 11/20/2019 1011   GLUCOSE 115 (H) 12/31/2013 0406   BUN 21 11/20/2019 1011   BUN 10 12/31/2013 0406   CREATININE 1.05 (H) 11/20/2019 1011   CREATININE 0.87 12/31/2013 0406   CALCIUM 9.1 11/20/2019 1011   CALCIUM 8.6 12/31/2013 0406   PROT 6.3 (L) 11/20/2019 1011   ALBUMIN 3.6 11/20/2019 1011   AST 28 11/20/2019 1011   ALT 28 11/20/2019 1011   ALKPHOS 65 11/20/2019 1011   BILITOT 0.6 11/20/2019 1011   GFRNONAA 51 (L) 11/20/2019 1011   GFRNONAA >60 12/31/2013 0406   GFRAA 59 (L) 11/20/2019 1011   GFRAA >60 12/31/2013 0406    No results found for: SPEP, UPEP  Lab Results  Component Value Date   WBC 6.1 11/20/2019   NEUTROABS 3.9 11/20/2019   HGB 12.5 11/20/2019   HCT 40.7 11/20/2019   MCV 85.5 11/20/2019   PLT 190 11/20/2019      Chemistry      Component Value Date/Time   NA 143 11/20/2019 1011   NA 143 12/31/2013 0406   K 4.2 11/20/2019 1011   K 4.5 12/31/2013 0406   CL 105 11/20/2019 1011   CL 111 (H) 12/31/2013 0406   CO2 31 11/20/2019 1011   CO2 28 12/31/2013 0406   BUN 21 11/20/2019 1011   BUN 10 12/31/2013 0406   CREATININE 1.05 (H) 11/20/2019 1011   CREATININE 0.87 12/31/2013 0406      Component Value Date/Time   CALCIUM 9.1 11/20/2019 1011   CALCIUM 8.6 12/31/2013 0406   ALKPHOS 65 11/20/2019 1011   AST 28 11/20/2019 1011   ALT 28 11/20/2019 1011   BILITOT 0.6 11/20/2019 1011     ASSESSMENT & PLAN:  Carcinoma of overlapping sites of left breast in female, estrogen receptor positive ([place]) # Breast cancer- stage II ER/PR positive HER-2/neu negative high risk Oncotype status post chemotherapy [2009]; adjuvant hormone therapy until 2012.   #Clinically node is of recurrence/mammogram January 2021  within normal limits.  #'Acute stroke-[macrh 2019]-s/p CEA- STABLE.  #Screening for osteoporosis-discussed that patient is high risk for osteoporosis given her age/BMI/status post aromatase inhibitors.  Recommend follow-up with PCP regarding bone density test.  Discussed the treatment options include exercise; oral/IV bisphosphonates.  Recommend follow-up with PCP  # Recommend follow-up in one year; mammogram will be ordered through her PCP [Dr.Sparks]. Prefers to keep follow up with [place].   #DISPOSIITION: # follow up in 12 months-MD/cbc/cmp-Dr.B  Cc; Dr.Sparks      Cammie Sickle, MD 11/20/2019 12:29 PM

## 2019-11-20 NOTE — Assessment & Plan Note (Addendum)
#  Breast cancer- stage II ER/PR positive HER-2/neu negative high risk Oncotype status post chemotherapy [2009]; adjuvant hormone therapy until 2012.   #Clinically node is of recurrence/mammogram January 2021 within normal limits.  #'Acute stroke-[macrh 2019]-s/p CEA- STABLE.  #Screening for osteoporosis-discussed that patient is high risk for osteoporosis given her age/BMI/status post aromatase inhibitors.  Recommend follow-up with PCP regarding bone density test.  Discussed the treatment options include exercise; oral/IV bisphosphonates.  Recommend follow-up with PCP  # Recommend follow-up in one year; mammogram will be ordered through her PCP [Dr.Sparks]. Prefers to keep follow up with [place].   #DISPOSIITION: # follow up in 12 months-MD/cbc/cmp-Dr.B  Cc; Dr.Sparks

## 2019-12-03 DIAGNOSIS — Z789 Other specified health status: Secondary | ICD-10-CM | POA: Insufficient documentation

## 2020-04-29 ENCOUNTER — Other Ambulatory Visit (INDEPENDENT_AMBULATORY_CARE_PROVIDER_SITE_OTHER): Payer: Self-pay | Admitting: Vascular Surgery

## 2020-04-29 DIAGNOSIS — I6523 Occlusion and stenosis of bilateral carotid arteries: Secondary | ICD-10-CM

## 2020-05-03 ENCOUNTER — Other Ambulatory Visit: Payer: Self-pay

## 2020-05-03 ENCOUNTER — Encounter (INDEPENDENT_AMBULATORY_CARE_PROVIDER_SITE_OTHER): Payer: Self-pay | Admitting: Vascular Surgery

## 2020-05-03 ENCOUNTER — Ambulatory Visit (INDEPENDENT_AMBULATORY_CARE_PROVIDER_SITE_OTHER): Payer: Medicare Other | Admitting: Vascular Surgery

## 2020-05-03 ENCOUNTER — Ambulatory Visit (INDEPENDENT_AMBULATORY_CARE_PROVIDER_SITE_OTHER): Payer: Medicare Other

## 2020-05-03 VITALS — BP 125/61 | HR 65 | Resp 16 | Wt 94.0 lb

## 2020-05-03 DIAGNOSIS — E7800 Pure hypercholesterolemia, unspecified: Secondary | ICD-10-CM | POA: Diagnosis not present

## 2020-05-03 DIAGNOSIS — I2510 Atherosclerotic heart disease of native coronary artery without angina pectoris: Secondary | ICD-10-CM | POA: Diagnosis not present

## 2020-05-03 DIAGNOSIS — I6523 Occlusion and stenosis of bilateral carotid arteries: Secondary | ICD-10-CM

## 2020-05-03 DIAGNOSIS — I10 Essential (primary) hypertension: Secondary | ICD-10-CM

## 2020-05-03 NOTE — Progress Notes (Signed)
MRN : [id number]  Kathleen Walls is a 79 y.o. (10/31/1940) female who presents with chief complaint of  Chief Complaint  Patient presents with  . Follow-up    ultrasound follow up  .  History of Present Illness:   The patient is seen for follow up evaluation of carotid stenosis status postrightcarotid endarterectomy on 12/26/2017. There were no post operative problems or complications related to the surgery. The patient denies neck or incisional pain.  The patient denies interval amaurosis fugax. There is no recent history of TIA symptoms or focal motor deficits. There is no prior documented CVA.  The patient denies headache.  The patient is taking enteric-coated aspirin 81 mg daily.  The patient has a history of coronary artery disease, no recent episodes of angina or shortness of breath. The patient denies PAD or claudication symptoms. There is a history of hyperlipidemia which is being treated with a statin.  Duplex ultrasound of the carotid arteries shows 1-39% ICA stenosis bilaterally.  Stable to slightly improved compared to the previous study.   Current Meds  Medication Sig  . amLODipine (NORVASC) 2.5 MG tablet Take by mouth.  Marland Kitchen aspirin EC 81 MG tablet Take 81 mg by mouth daily.   . Azelaic Acid 15 % cream Apply 1 application topically daily.   . butalbital-acetaminophen-caffeine (FIORICET, ESGIC) 50-325-40 MG tablet Take 1-2 tablets by mouth daily as needed for headache.  . docusate sodium (STOOL SOFTENER) 100 MG capsule Take 100 mg by mouth daily as needed for mild constipation.   Marland Kitchen donepezil (ARICEPT) 10 MG tablet Take 10 mg by mouth at bedtime.   Marland Kitchen ezetimibe (ZETIA) 10 MG tablet Take 10 mg by mouth daily.   . hydroxychloroquine (PLAQUENIL) 200 MG tablet Take 1 tablet by mouth once daily  . ipratropium (ATROVENT) 0.03 % nasal spray USE 2 SPRAYS IN EACH NOSTRIL THREE TIMES DAILY AS NEEDED FOR DRAINAGE  . melatonin 3 MG TABS tablet Take by mouth.  .  metoprolol succinate (TOPROL-XL) 25 MG 24 hr tablet Take 25 mg by mouth daily.   . mirtazapine (REMERON) 7.5 MG tablet Take 7.5 mg by mouth at bedtime.  . nitroGLYCERIN (NITROSTAT) 0.4 MG SL tablet Place 0.4 mg under the tongue every 5 (five) minutes as needed for chest pain.  Marland Kitchen omeprazole (PRILOSEC) 20 MG capsule Take 20 mg by mouth 2 (two) times daily before a meal.  . sodium chloride (MURO 128) 5 % ophthalmic solution Place 1 drop into the left eye at bedtime.   Marland Kitchen venlafaxine XR (EFFEXOR-XR) 37.5 MG 24 hr capsule Take 37.5 mg by mouth daily.    Past Medical History:  Diagnosis Date  . Arthritis   . Breast cancer [facility]) 2009    2009 breast cancer stage IIA T2 N0, ER positive, HER-2 negative, 2.5 cm primary tumor, poorly differentiated, status post wide local excision, one sentinel lymph node negative, high-risk Oncotype score 48  . Cataracts, both eyes   . Colitis   . Complication of anesthesia    confusion  . Coronary artery disease   . Detached vitreous humor   . Diverticulitis   . 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 Gastritis   . GERD (gastroesophageal reflux disease)   . 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 Hemorrhoid   . Hemorrhoids   . History of kidney stones   . Hyperlipemia   . 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  Kidney cysts    Left  . Kidney stones   . Lipoma of shoulder 2009   left  . Migraine   . Osteoarthritis   . Osteopenia   . Stented coronary artery   . Stroke [facility])     Past Surgical History:  Procedure Laterality Date  . ABDOMINAL HYSTERECTOMY    . benign breast biopsy  2007  . BREAST BIOPSY Left 2007   positive  . BREAST CYST ASPIRATION Right    neg  . BREAST LUMPECTOMY Left 2009    2009 breast cancer stage IIA T2 N0, ER positive, HER-2 negative, 2.5 cm primary tumor, poorly differentiated, status post wide local excision, one sentinel lymph node negative, high-risk Oncotype score 48  . BREAST  SURGERY    . CARDIAC CATHETERIZATION N/A 06/22/2015   Procedure: Left Heart Cath and Coronary Angiography;  Surgeon: Teodoro Spray, MD;  Location: [place] CV LAB;  Service: Cardiovascular;  Laterality: N/A;  . CARDIAC CATHETERIZATION    . CATARACT EXTRACTION    . COLONOSCOPY    . CORONARY ANGIOPLASTY    . ENDARTERECTOMY Right 12/26/2017   Procedure: ENDARTERECTOMY CAROTID;  Surgeon: Katha Cabal, MD;  Location: [facility] ORS;  Service: Vascular;  Laterality: Right;  . EYE SURGERY    . FOOT FRACTURE SURGERY Left   . PARTIAL HYSTERECTOMY    . UPPER GASTROINTESTINAL ENDOSCOPY      Social History Social History   Tobacco Use  . Smoking status: Former Smoker    Types: Cigarettes    Quit date: 08/14/1993    Years since quitting: 26.7  . Smokeless tobacco: Never Used  Vaping Use  . Vaping Use: Never used  Substance Use Topics  . Alcohol use: Not Currently    Alcohol/week: 0.0 standard drinks    Comment: rare  . Drug use: No    Family History Family History  Problem Relation Age of Onset  . Heart disease Mother   . Diabetes Father   . Heart disease Father   . Diabetes Sister   . Diabetes Brother   . Cervical cancer Cousin   . Breast cancer Cousin   . Lung cancer Paternal Aunt   . Prostate cancer Maternal Uncle   . Leukemia Other   . Hypertension Other     Allergies  Allergen Reactions  . Alendronate Sodium Other (See Comments)    Unknown  . Atorvastatin Other (See Comments)    Muscle Pain  . Azithromycin Hives  . Ciprofloxacin Other (See Comments)    Headache  . Colestipol Other (See Comments)    Unknown  . Lidocaine Hives  . Lovastatin Other (See Comments)    Headache  . Metronidazole Other (See Comments)    Headache  . Niacin Other (See Comments)    Unknown  . Risedronate Sodium Other (See Comments)    Unknown  . Rosuvastatin Other (See Comments)    Unknown  . Simvastatin Other (See Comments)    Headache     REVIEW OF SYSTEMS (Negative unless  checked)  Constitutional: $RemoveBeforeDE'[]'FHIgueJdcpGciPZ$ Weight loss  $Rem'[]'qylJ$ Fever  $Remo'[]'XQVvS$ Chills Cardiac: $RemoveBeforeD'[]'OcUIUAWHaNetOY$ Chest pain   '[]'$ Chest pressure   '[]'$ Palpitations   '[]'$ Shortness of breath when laying flat   '[]'$ Shortness of breath with exertion. Vascular:  $RemoveBe'[]'QkVxDcSKi$ Pain in legs with walking   '[]'$ Pain in legs at rest  $Rem'[]'dYgB$ History of DVT   '[]'$ Phlebitis   '[]'$ Swelling in legs   '[]'$ Varicose veins   '[]'$ Non-healing ulcers Pulmonary:   '[]'$ Uses home oxygen   '[]'$ Productive cough   '[]'$   Hemoptysis   [] Wheeze  [] COPD   [] Asthma Neurologic:  [] Dizziness   [] Seizures   [x] History of stroke   [x] History of TIA  [] Aphasia   [] Vissual changes   [] Weakness or numbness in arm   [] Weakness or numbness in leg Musculoskeletal:   [] Joint swelling   [] Joint pain   [] Low back pain Hematologic:  [] Easy bruising  [] Easy bleeding   [] Hypercoagulable state   [] Anemic Gastrointestinal:  [] Diarrhea   [] Vomiting  [] Gastroesophageal reflux/heartburn   [] Difficulty swallowing. Genitourinary:  [] Chronic kidney disease   [] Difficult urination  [] Frequent urination   [] Blood in urine Skin:  [] Rashes   [] Ulcers  Psychological:  [] History of anxiety   []  History of major depression.  Physical Examination  Vitals:   05/03/20 1103  BP: 125/61  Pulse: 65  Resp: 16  Weight: 94 lb (42.6 kg)   Body mass index is 16.92 kg/m. Gen: WD/WN, NAD Head: [place]/AT, No temporalis wasting.  Ear/Nose/Throat: Hearing grossly intact, nares w/o erythema or drainage Eyes: PER, EOMI, sclera nonicteric.  Neck: Supple, no large masses.   Pulmonary:  Good air movement, no audible wheezing bilaterally, no use of accessory muscles.  Cardiac: RRR, no JVD Vascular:  Left carotid bruit Vessel Right Left  Radial Palpable Palpable  Carotid Palpable Palpable  Gastrointestinal: Non-distended. No guarding/no peritoneal signs.  Musculoskeletal: M/S 5/5 throughout.  No deformity or atrophy.  Neurologic: CN 2-12 intact. Symmetrical.  Speech is fluent. Motor exam as listed above. Psychiatric: Judgment intact, Mood & affect  appropriate for pt's clinical situation. Dermatologic: No rashes or ulcers noted.  No changes consistent with cellulitis.   CBC Lab Results  Component Value Date   WBC 6.1 11/20/2019   HGB 12.5 11/20/2019   HCT 40.7 11/20/2019   MCV 85.5 11/20/2019   PLT 190 11/20/2019    BMET    Component Value Date/Time   NA 143 11/20/2019 1011   NA 143 12/31/2013 0406   K 4.2 11/20/2019 1011   K 4.5 12/31/2013 0406   CL 105 11/20/2019 1011   CL 111 (H) 12/31/2013 0406   CO2 31 11/20/2019 1011   CO2 28 12/31/2013 0406   GLUCOSE 87 11/20/2019 1011   GLUCOSE 115 (H) 12/31/2013 0406   BUN 21 11/20/2019 1011   BUN 10 12/31/2013 0406   CREATININE 1.05 (H) 11/20/2019 1011   CREATININE 0.87 12/31/2013 0406   CALCIUM 9.1 11/20/2019 1011   CALCIUM 8.6 12/31/2013 0406   GFRNONAA 51 (L) 11/20/2019 1011   GFRNONAA >60 12/31/2013 0406   GFRAA 59 (L) 11/20/2019 1011   GFRAA >60 12/31/2013 0406   CrCl cannot be calculated (Patient's most recent lab result is older than the maximum 21 days allowed.).  COAG Lab Results  Component Value Date   INR 0.99 12/14/2017   INR 0.90 11/16/2017    Radiology No results found.   Assessment/Plan 1. Bilateral carotid artery stenosis Recommend:  Given the patient's asymptomatic subcritical stenosis no further invasive testing or surgery at this time.  Duplex ultrasound of the carotid arteries shows <40% ICA stenosis bilaterally  Continue antiplatelet therapy as prescribed Continue management of CAD, HTN and Hyperlipidemia Healthy heart diet,  encouraged exercise at least 4 times per week Follow up in 12 months with duplex ultrasound and physical   2. Benign hypertension Continue antihypertensive medications as already ordered, these medications have been reviewed and there are no changes at this time.   3. Arteriosclerosis of coronary artery Continue cardiac and antihypertensive medications as already ordered  and reviewed, no changes at this  time.  Continue statin as ordered and reviewed, no changes at this time  Nitrates PRN for chest pain   4. Pure hypercholesterolemia Continue statin as ordered and reviewed, no changes at this time     Hortencia Pilar, MD  05/03/2020 11:20 AM

## 2020-07-29 ENCOUNTER — Other Ambulatory Visit: Payer: Self-pay | Admitting: Internal Medicine

## 2020-07-29 DIAGNOSIS — Z1231 Encounter for screening mammogram for malignant neoplasm of breast: Secondary | ICD-10-CM

## 2020-10-19 ENCOUNTER — Other Ambulatory Visit: Payer: Self-pay

## 2020-10-19 ENCOUNTER — Ambulatory Visit
Admission: RE | Admit: 2020-10-19 | Discharge: 2020-10-19 | Disposition: A | Discharge status: Home / Self Care | Payer: Medicare Other | Source: Ambulatory Visit | Attending: Internal Medicine | Admitting: Internal Medicine

## 2020-10-19 DIAGNOSIS — Z1231 Encounter for screening mammogram for malignant neoplasm of breast: Secondary | ICD-10-CM | POA: Diagnosis present

## 2020-11-19 ENCOUNTER — Other Ambulatory Visit: Payer: Self-pay

## 2020-11-19 ENCOUNTER — Inpatient Hospital Stay: Payer: Medicare Other | Admitting: Internal Medicine

## 2020-11-19 ENCOUNTER — Encounter: Payer: Self-pay | Admitting: Internal Medicine

## 2020-11-19 ENCOUNTER — Inpatient Hospital Stay: Payer: Medicare Other | Attending: Internal Medicine

## 2020-11-19 DIAGNOSIS — Z170 Estrogen receptor positive status [ER+]: Secondary | ICD-10-CM | POA: Diagnosis not present

## 2020-11-19 DIAGNOSIS — C50812 Malignant neoplasm of overlapping sites of left female breast: Secondary | ICD-10-CM | POA: Diagnosis not present

## 2020-11-19 DIAGNOSIS — Z853 Personal history of malignant neoplasm of breast: Secondary | ICD-10-CM | POA: Diagnosis not present

## 2020-11-19 DIAGNOSIS — Z9221 Personal history of antineoplastic chemotherapy: Secondary | ICD-10-CM | POA: Insufficient documentation

## 2020-11-19 DIAGNOSIS — Z87891 Personal history of nicotine dependence: Secondary | ICD-10-CM | POA: Diagnosis not present

## 2020-11-19 DIAGNOSIS — Z8673 Personal history of transient ischemic attack (TIA), and cerebral infarction without residual deficits: Secondary | ICD-10-CM | POA: Insufficient documentation

## 2020-11-19 DIAGNOSIS — M8580 Other specified disorders of bone density and structure, unspecified site: Secondary | ICD-10-CM | POA: Insufficient documentation

## 2020-11-19 LAB — COMPREHENSIVE METABOLIC PANEL
ALT: 17 U/L (ref 0–44)
AST: 25 U/L (ref 15–41)
Albumin: 4 g/dL (ref 3.5–5.0)
Alkaline Phosphatase: 69 U/L (ref 38–126)
Anion gap: 8 (ref 5–15)
BUN: 17 mg/dL (ref 8–23)
CO2: 31 mmol/L (ref 22–32)
Calcium: 9.2 mg/dL (ref 8.9–10.3)
Chloride: 102 mmol/L (ref 98–111)
Creatinine, Ser: 0.85 mg/dL (ref 0.44–1.00)
GFR, Estimated: >60 mL/min (ref >60)
Glucose, Bld: 134 mg/dL — ABNORMAL HIGH (ref 70–99)
Potassium: 3.8 mmol/L (ref 3.5–5.1)
Sodium: 141 mmol/L (ref 135–145)
Total Bilirubin: 0.4 mg/dL (ref 0.3–1.2)
Total Protein: 6.6 g/dL (ref 6.5–8.1)

## 2020-11-19 LAB — CBC WITH DIFFERENTIAL/PLATELET
Abs Immature Granulocytes: 0.02 10*3/uL (ref 0.00–0.07)
Basophils Absolute: 0 10*3/uL (ref 0.0–0.1)
Basophils Relative: 1 %
Eosinophils Absolute: 0.2 10*3/uL (ref 0.0–0.5)
Eosinophils Relative: 3 %
HCT: 40.3 % (ref 36.0–46.0)
Hemoglobin: 12.5 g/dL (ref 12.0–15.0)
Immature Granulocytes: 0 %
Lymphocytes Relative: 18 %
Lymphs Abs: 1.1 10*3/uL (ref 0.7–4.0)
MCH: 26.4 pg (ref 26.0–34.0)
MCHC: 31 g/dL (ref 30.0–36.0)
MCV: 85.2 fL (ref 80.0–100.0)
Monocytes Absolute: 0.7 10*3/uL (ref 0.1–1.0)
Monocytes Relative: 12 %
Neutro Abs: 3.7 10*3/uL (ref 1.7–7.7)
Neutrophils Relative %: 66 %
Platelets: 243 10*3/uL (ref 150–400)
RBC: 4.73 MIL/uL (ref 3.87–5.11)
RDW: 15.3 % (ref 11.5–15.5)
WBC: 5.7 10*3/uL (ref 4.0–10.5)
nRBC: 0 % (ref 0.0–0.2)

## 2020-11-19 NOTE — Progress Notes (Signed)
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  Patient denies any new onset of joint pains or bone pain.  Appetite is good.  No headaches.  No nausea no vomiting.  Review of Systems  Constitutional: Positive for malaise/fatigue. Negative for chills, diaphoresis, fever and weight loss.  HENT: Negative for nosebleeds and sore throat.   Eyes: Negative for double vision.  Respiratory: Negative for cough, hemoptysis, sputum production, shortness of breath and wheezing.   Cardiovascular: Negative for chest pain, palpitations, orthopnea and leg swelling.  Gastrointestinal: Negative for abdominal pain, blood in stool, constipation, diarrhea, heartburn, melena, nausea and vomiting.  Genitourinary: Negative for dysuria, frequency and urgency.  Musculoskeletal: Positive for back pain and joint pain.  Skin: Negative.  Negative for itching and rash.  Neurological: Negative for dizziness, tingling, focal weakness, weakness and headaches.  Endo/Heme/Allergies: Does not bruise/bleed easily.  Psychiatric/Behavioral: Negative for depression. The patient is not nervous/anxious and does not have insomnia.      PAST MEDICAL HISTORY :  Past Medical History:  Diagnosis Date  . Arthritis   . Breast cancer [facility] 2009    2009  breast cancer stage IIA T2 N0, ER positive, HER-2 negative, 2.5 cm primary tumor, poorly differentiated, status post wide local excision, one sentinel lymph node negative, high-risk Oncotype score 48  . Cataracts, both eyes   . Colitis   . Complication of anesthesia    confusion  . Coronary artery disease   . Detached vitreous humor   . Diverticulitis   . 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 Gastritis   . GERD (gastroesophageal reflux disease)   . 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 Hemorrhoid   . Hemorrhoids   . History of kidney stones   . Hyperlipemia   . 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 Kidney cysts    Left  . Kidney stones   . Lipoma of shoulder 2009   left  . Migraine   . Osteoarthritis   . Osteopenia   . Stented coronary artery   . Stroke [facility])     PAST SURGICAL HISTORY :   Past Surgical History:  Procedure Laterality Date  . ABDOMINAL HYSTERECTOMY    . benign breast biopsy  2007  . BREAST BIOPSY Left 2007   positive  . BREAST CYST ASPIRATION Right    neg  . BREAST LUMPECTOMY Left 2009    2009 breast cancer stage IIA T2 N0, ER positive, HER-2 negative, 2.5 cm primary tumor, poorly differentiated, status post wide local excision, one sentinel lymph node negative, high-risk Oncotype score 48  . BREAST SURGERY    . CARDIAC CATHETERIZATION N/A 06/22/2015   Procedure: Left Heart Cath and Coronary  Angiography;  Surgeon: Teodoro Spray, MD;  Location: [place] CV LAB;  Service: Cardiovascular;  Laterality: N/A;  . CARDIAC CATHETERIZATION    . CATARACT EXTRACTION    . COLONOSCOPY    . CORONARY ANGIOPLASTY    . ENDARTERECTOMY Right 12/26/2017   Procedure: ENDARTERECTOMY CAROTID;  Surgeon: Katha Cabal, MD;  Location: [facility] ORS;  Service: Vascular;  Laterality: Right;  . EYE SURGERY    . FOOT FRACTURE SURGERY Left   . PARTIAL HYSTERECTOMY    . UPPER GASTROINTESTINAL ENDOSCOPY      FAMILY HISTORY :    Family History  Problem Relation Age of Onset  . Heart disease Mother   . Diabetes Father   . Heart disease Father   . Diabetes Sister   . Diabetes Brother   . Cervical cancer Cousin   . Breast cancer Cousin   . Lung cancer Paternal Aunt   . Prostate cancer Maternal Uncle   . Leukemia Other   . Hypertension Other     SOCIAL HISTORY:   Social History   Tobacco Use  . Smoking status: Former Smoker    Types: Cigarettes    Quit date: 08/14/1993    Years since quitting: 27.3  . Smokeless tobacco: Never Used  Vaping Use  . Vaping Use: Never used  Substance Use Topics  . Alcohol use: Not Currently    Alcohol/week: 0.0 standard drinks    Comment: rare  . Drug use: No    ALLERGIES:  is allergic to alendronate sodium, atorvastatin, azithromycin, ciprofloxacin, colestipol, lidocaine, lovastatin, metronidazole, niacin, risedronate sodium, rosuvastatin, and simvastatin.  MEDICATIONS:  Current Outpatient Medications  Medication Sig Dispense Refill  . ALPRAZolam (XANAX) 0.25 MG tablet TAKE ONE-HALF TABLET BY MOUTH AT BEDTIME    . amLODipine (NORVASC) 2.5 MG tablet Take by mouth.    Marland Kitchen aspirin EC 81 MG tablet Take 81 mg by mouth daily.     . Azelaic Acid 15 % cream Apply 1 application topically daily.     . butalbital-acetaminophen-caffeine (FIORICET, ESGIC) 50-325-40 MG tablet Take 1-2 tablets by mouth daily as needed for headache.    . docusate sodium (COLACE) 100 MG capsule Take 100 mg by mouth daily as needed for mild constipation.     Marland Kitchen donepezil (ARICEPT) 10 MG tablet Take 10 mg by mouth at bedtime.     Marland Kitchen ezetimibe (ZETIA) 10 MG tablet Take 10 mg by mouth daily.     . hydroxychloroquine (PLAQUENIL) 200 MG tablet Take 1 tablet by mouth once daily    . ipratropium (ATROVENT) 0.03 % nasal spray USE 2 SPRAYS IN EACH NOSTRIL THREE TIMES DAILY AS NEEDED FOR DRAINAGE    . melatonin 3 MG TABS tablet Take by mouth.    . metoprolol succinate (TOPROL-XL) 25 MG 24 hr tablet Take 25 mg by  mouth daily.     Marland Kitchen omeprazole (PRILOSEC) 20 MG capsule Take 20 mg by mouth 2 (two) times daily before a meal.    . sodium chloride (MURO 128) 5 % ophthalmic solution Place 1 drop into the left eye at bedtime.    Marland Kitchen venlafaxine XR (EFFEXOR-XR) 37.5 MG 24 hr capsule Take 37.5 mg by mouth daily.    . mirtazapine (REMERON) 7.5 MG tablet Take 7.5 mg by mouth at bedtime. (Patient not taking: Reported on 11/19/2020)    . nitroGLYCERIN (NITROSTAT) 0.4 MG SL tablet Place 0.4 mg under the tongue every 5 (five) minutes as needed for chest pain. (Patient not taking:  Reported on 11/19/2020)     No current facility-administered medications for this visit.    PHYSICAL EXAMINATION: ECOG PERFORMANCE STATUS: 0 - Asymptomatic  BP 109/60 (BP Location: Left Arm, Patient Position: Sitting, Cuff Size: Normal)   Pulse (!) 57   Temp 97.9 F (36.6 C) (Oral)   Resp 16   Ht 5' 2.5" (1.588 m)   Wt 96 lb 9.6 oz (43.8 kg)   SpO2 98%   BMI 17.39 kg/m   Filed Weights   11/19/20 1026  Weight: 96 lb 9.6 oz (43.8 kg)   Physical Exam HENT:     Head: Normocephalic and atraumatic.     Mouth/Throat:     Pharynx: No oropharyngeal exudate.  Eyes:     Pupils: Pupils are equal, round, and reactive to light.  Cardiovascular:     Rate and Rhythm: Normal rate and regular rhythm.  Pulmonary:     Effort: Pulmonary effort is normal. No respiratory distress.     Breath sounds: Normal breath sounds. No wheezing.  Abdominal:     General: Bowel sounds are normal. There is no distension.     Palpations: Abdomen is soft. There is no mass.     Tenderness: There is no abdominal tenderness. There is no guarding or rebound.  Musculoskeletal:        General: No tenderness. Normal range of motion.     Cervical back: Normal range of motion and neck supple.  Skin:    General: Skin is warm.  Neurological:     Mental Status: She is alert and oriented to person, place, and time.  Psychiatric:        Mood and Affect: Affect normal.       LABORATORY DATA:  I have reviewed the data as listed    Component Value Date/Time   NA 141 11/19/2020 1015   NA 143 12/31/2013 0406   K 3.8 11/19/2020 1015   K 4.5 12/31/2013 0406   CL 102 11/19/2020 1015   CL 111 (H) 12/31/2013 0406   CO2 31 11/19/2020 1015   CO2 28 12/31/2013 0406   GLUCOSE 134 (H) 11/19/2020 1015   GLUCOSE 115 (H) 12/31/2013 0406   BUN 17 11/19/2020 1015   BUN 10 12/31/2013 0406   CREATININE 0.85 11/19/2020 1015   CREATININE 0.87 12/31/2013 0406   CALCIUM 9.2 11/19/2020 1015   CALCIUM 8.6 12/31/2013 0406   PROT 6.6 11/19/2020 1015   ALBUMIN 4.0 11/19/2020 1015   AST 25 11/19/2020 1015   ALT 17 11/19/2020 1015   ALKPHOS 69 11/19/2020 1015   BILITOT 0.4 11/19/2020 1015   GFRNONAA >60 11/19/2020 1015   GFRNONAA >60 12/31/2013 0406   GFRAA 59 (L) 11/20/2019 1011   GFRAA >60 12/31/2013 0406    No results found for: SPEP, UPEP  Lab Results  Component Value Date   WBC 5.7 11/19/2020   NEUTROABS 3.7 11/19/2020   HGB 12.5 11/19/2020   HCT 40.3 11/19/2020   MCV 85.2 11/19/2020   PLT 243 11/19/2020      Chemistry      Component Value Date/Time   NA 141 11/19/2020 1015   NA 143 12/31/2013 0406   K 3.8 11/19/2020 1015   K 4.5 12/31/2013 0406   CL 102 11/19/2020 1015   CL 111 (H) 12/31/2013 0406   CO2 31 11/19/2020 1015   CO2 28 12/31/2013 0406   BUN 17 11/19/2020 1015   BUN 10 12/31/2013 0406   CREATININE 0.85 11/19/2020 1015  CREATININE 0.87 12/31/2013 0406      Component Value Date/Time   CALCIUM 9.2 11/19/2020 1015   CALCIUM 8.6 12/31/2013 0406   ALKPHOS 69 11/19/2020 1015   AST 25 11/19/2020 1015   ALT 17 11/19/2020 1015   BILITOT 0.4 11/19/2020 1015     ASSESSMENT & PLAN:   Carcinoma of overlapping sites of left breast in female, estrogen receptor positive ([place]) # Breast cancer- stage II ER/PR positive HER-2/neu negative high risk Oncotype status post chemotherapy [2009]; adjuvant hormone therapy until 2012.    #Clinically node is of recurrence/mammogram [facility] 2022-  within normal limits.  #'Acute stroke-[march 2019]-s/p CEA- STABLE.  # OSTOPENIA: 2021 [PCP]- T score -2.3- continue follow up with PCP.   # Recommend follow-up in one year; mammogram will be ordered through her PCP [Dr.Sparks]. Prefers to keep follow up with [place].   #DISPOSIITION: # follow up in 12 months-MD/cbc/cmp-Dr.B  Cc; Dr.Sparks      Cammie Sickle, MD 12/01/2020 8:42 PM

## 2020-11-19 NOTE — Assessment & Plan Note (Addendum)
#  Breast cancer- stage II ER/PR positive HER-2/neu negative high risk Oncotype status post chemotherapy [2009]; adjuvant hormone therapy until 2012.   #Clinically node is of recurrence/mammogram [facility] 2022-  within normal limits.  #'Acute stroke-[march 2019]-s/p CEA- STABLE.  # OSTOPENIA: 2021 [PCP]- T score -2.3- continue follow up with PCP.   # Recommend follow-up in one year; mammogram will be ordered through her PCP [Dr.Sparks]. Prefers to keep follow up with [place].   #DISPOSIITION: # follow up in 12 months-MD/cbc/cmp-Dr.B  Cc; Dr.Sparks

## 2021-04-27 NOTE — Progress Notes (Signed)
MRN : [id number]  Kathleen Walls is a 80 y.o. ([birth year]) female who presents with chief complaint of check carotid arteries.  History of Present Illness:   The patient is seen for follow up evaluation of carotid stenosis status post right carotid endarterectomy on 12/26/2017.  There were no post operative problems or complications related to the surgery.  The patient denies neck or incisional pain.   The patient denies interval amaurosis fugax. There is no recent history of TIA symptoms or focal motor deficits. There is no prior documented CVA.   The patient denies headache.   The patient is taking enteric-coated aspirin 81 mg daily.  Patient reports that there have not been any significant changes in her overall health care over the past year   The patient has a history of coronary artery disease, no recent episodes of angina or shortness of breath. The patient denies PAD or claudication symptoms. There is a history of hyperlipidemia which is being treated with a statin.    Duplex ultrasound of the carotid arteries shows 1-39% ICA stenosis bilaterally (on today's report we are now getting a very subtle increase in velocities from 120cm/s to 130 cm/s and therefore by criteria she would qualify as a 40 to 59% on the left however given such a small difference in the velocities I believe that the ultrasound is unchanged).  Stable to slightly improved compared to the previous study.  No outpatient medications have been marked as taking for the 04/28/21 encounter (Appointment) with Delana Meyer, Dolores Lory, MD.    Past Medical History:  Diagnosis Date   Arthritis    Breast cancer [facility]) 2009    2009 breast cancer stage IIA T2 N0, ER positive, HER-2 negative, 2.5 cm primary tumor, poorly differentiated, status post wide local excision, one sentinel lymph node negative, high-risk Oncotype score 48   Cataracts, both eyes    Colitis    Complication of anesthesia    confusion   Coronary artery  disease    Detached vitreous humor    Diverticulitis    Female bladder prolapse    Gastritis    GERD (gastroesophageal reflux disease)    Heart valve problem 06/19/2013   Leaking   Hemorrhoid    Hemorrhoids    History of kidney stones    Hyperlipemia    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[place])    ERPR POSITIVE 2.5CM GRADE 3, STAGE 2   Kidney cysts    Left   Kidney stones    Lipoma of shoulder 2009   left   Migraine    Osteoarthritis    Osteopenia    Stented coronary artery    Stroke [facility])     Past Surgical History:  Procedure Laterality Date   ABDOMINAL HYSTERECTOMY     benign breast biopsy  2007   BREAST BIOPSY Left 2007   positive   BREAST CYST ASPIRATION Right    neg   BREAST LUMPECTOMY Left 2009    2009 breast cancer stage IIA T2 N0, ER positive, HER-2 negative, 2.5 cm primary tumor, poorly differentiated, status post wide local excision, one sentinel lymph node negative, high-risk Oncotype score 48   BREAST SURGERY     CARDIAC CATHETERIZATION N/A 06/22/2015   Procedure: Left Heart Cath and Coronary Angiography;  Surgeon: Teodoro Spray, MD;  Location: [place] CV LAB;  Service: Cardiovascular;  Laterality: N/A;   CARDIAC CATHETERIZATION     CATARACT EXTRACTION     COLONOSCOPY  CORONARY ANGIOPLASTY     ENDARTERECTOMY Right 12/26/2017   Procedure: ENDARTERECTOMY CAROTID;  Surgeon: Katha Cabal, MD;  Location: [facility] ORS;  Service: Vascular;  Laterality: Right;   EYE SURGERY     FOOT FRACTURE SURGERY Left    PARTIAL HYSTERECTOMY     UPPER GASTROINTESTINAL ENDOSCOPY      Social History Social History   Tobacco Use   Smoking status: Former    Types: Cigarettes    Quit date: 08/14/1993    Years since quitting: 27.7   Smokeless tobacco: Never  Vaping Use   Vaping Use: Never used  Substance Use Topics   Alcohol use: Not Currently    Alcohol/week: 0.0 standard drinks    Comment: rare   Drug use: No    Family History Family History  Problem  Relation Age of Onset   Heart disease Mother    Diabetes Father    Heart disease Father    Diabetes Sister    Diabetes Brother    Cervical cancer Cousin    Breast cancer Cousin    Lung cancer Paternal Aunt    Prostate cancer Maternal Uncle    Leukemia Other    Hypertension Other     Allergies  Allergen Reactions   Alendronate Sodium Other (See Comments)    Unknown   Atorvastatin Other (See Comments)    Muscle Pain   Azithromycin Hives   Ciprofloxacin Other (See Comments)    Headache   Colestipol Other (See Comments)    Unknown   Lidocaine Hives   Lovastatin Other (See Comments)    Headache   Metronidazole Other (See Comments)    Headache   Niacin Other (See Comments)    Unknown   Risedronate Sodium Other (See Comments)    Unknown   Rosuvastatin Other (See Comments)    Unknown   Simvastatin Other (See Comments)    Headache     REVIEW OF SYSTEMS (Negative unless checked)  Constitutional: _0 Weight loss  _1 Fever  _2 Chills Cardiac: _3 Chest pain   _4 Chest pressure   _5 Palpitations   _6 Shortness of breath when laying flat   _7 Shortness of breath with exertion. Vascular:  _8 Pain in legs with walking   _9 Pain in legs at rest  _10 History of DVT   _11 Phlebitis   _12 Swelling in legs   _13 Varicose veins   _14 Non-healing ulcers Pulmonary:   _15 Uses home oxygen   _16 Productive cough   _17 Hemoptysis   _18 Wheeze  _19 COPD   _20 Asthma Neurologic:  _21 Dizziness   _22 Seizures   _23 History of stroke   _24 History of TIA  _25 Aphasia   _26 Vissual changes   _27 Weakness or numbness in arm   _28 Weakness or numbness in leg Musculoskeletal:   _29 Joint swelling   _30 Joint pain   _31 Low back pain Hematologic:  _32 Easy bruising  _33 Easy bleeding   _34 Hypercoagulable state   _35 Anemic Gastrointestinal:  _36 Diarrhea   _37 Vomiting  _38 Gastroesophageal reflux/heartburn   _39 Difficulty swallowing. Genitourinary:  _40 Chronic kidney disease   _41 Difficult urination  _42 Frequent urination   _43 Blood in urine Skin:  _44 Rashes   _45 Ulcers   Psychological:  _46 History of anxiety   _47  History of major depression.  Physical Examination  There were no vitals filed for this visit. There is no height or weight on file to calculate BMI. Gen: WD/WN, NAD Head: [place]/AT, No temporalis wasting.  Ear/Nose/Throat: Hearing grossly intact, nares w/o erythema or drainage Eyes: PER, EOMI, sclera nonicteric.  Neck: Supple, no masses.  No bruit or JVD.  Pulmonary:  Good air movement,  no audible wheezing, no use of accessory muscles.  Cardiac: RRR, normal S1, S2, no Murmurs. Vascular:   left carotid bruit Vessel Right Left  Radial Palpable Palpable  Carotid Palpable Palpable  Gastrointestinal: soft, non-distended. No guarding/no peritoneal signs.  Musculoskeletal: M/S 5/5 throughout.  No visible deformity.  Neurologic: CN 2-12 intact. Pain and light touch intact in extremities.  Symmetrical.  Speech is fluent. Motor exam as listed above. Psychiatric: Judgment intact, Mood & affect appropriate for pt's clinical situation. Dermatologic: No rashes or ulcers noted.  No changes consistent with cellulitis.   CBC Lab Results  Component Value Date   WBC 5.7 11/19/2020   HGB 12.5 11/19/2020   HCT 40.3 11/19/2020   MCV 85.2 11/19/2020   PLT 243 11/19/2020    BMET    Component Value Date/Time   NA 141 11/19/2020 1015   NA 143 12/31/2013 0406   K 3.8 11/19/2020 1015   K 4.5 12/31/2013 0406   CL 102 11/19/2020 1015   CL 111 (H) 12/31/2013 0406   CO2 31 11/19/2020 1015   CO2 28 12/31/2013 0406   GLUCOSE 134 (H) 11/19/2020 1015   GLUCOSE 115 (H) 12/31/2013 0406   BUN 17 11/19/2020 1015   BUN 10 12/31/2013 0406   CREATININE 0.85 11/19/2020 1015   CREATININE 0.87 12/31/2013 0406   CALCIUM 9.2 11/19/2020 1015   CALCIUM 8.6 12/31/2013 0406   GFRNONAA >60 11/19/2020 1015   GFRNONAA >60 12/31/2013 0406   GFRAA 59 (L) 11/20/2019 1011   GFRAA >60 12/31/2013 0406   CrCl cannot be calculated (Patient's most recent lab result is older than  the maximum 21 days allowed.).  COAG Lab Results  Component Value Date   INR 0.99 12/14/2017   INR 0.90 11/16/2017    Radiology No results found.   Assessment/Plan 1. Bilateral carotid artery stenosis Recommend:   Given the patient's asymptomatic subcritical stenosis no further invasive testing or surgery at this time.   Duplex ultrasound of the carotid arteries shows <40% ICA stenosis bilaterally   Continue antiplatelet therapy as prescribed Continue management of CAD, HTN and Hyperlipidemia Healthy heart diet,  encouraged exercise at least 4 times per week Follow up in 12 months with duplex ultrasound and physical.   - VAS US CAROTID; Future  2. Raynaud's phenomenon without gangrene The patient's Raynaud's is better and her symptoms are stable.  Behavioral modification was stressed; avoidance of cold and utilizing wool socks was reviewed again.  The reported BP today was above the AHA goal and therefore there will continue Norvasc.  Norvasc 2.5 mg po daily is prescribed but not needed currently  The patient will follow up if new problems arise.   3. Arteriosclerosis of coronary artery Continue cardiac and antihypertensive medications as already ordered and reviewed, no changes at this time.  Continue statin as ordered and reviewed, no changes at this time  Nitrates PRN for chest pain   4. Benign hypertension Continue antihypertensive medications as already ordered, these medications have been reviewed and there are no changes at this time.   5. Mixed hyperlipidemia Continue statin as ordered and reviewed, no changes at this time    Hortencia Pilar, MD  04/27/2021 9:36 PM

## 2021-04-28 ENCOUNTER — Ambulatory Visit (INDEPENDENT_AMBULATORY_CARE_PROVIDER_SITE_OTHER): Payer: Medicare Other | Admitting: Vascular Surgery

## 2021-04-28 ENCOUNTER — Other Ambulatory Visit: Payer: Self-pay

## 2021-04-28 ENCOUNTER — Encounter (INDEPENDENT_AMBULATORY_CARE_PROVIDER_SITE_OTHER): Payer: Self-pay | Admitting: Vascular Surgery

## 2021-04-28 ENCOUNTER — Ambulatory Visit (INDEPENDENT_AMBULATORY_CARE_PROVIDER_SITE_OTHER): Payer: Medicare Other

## 2021-04-28 VITALS — BP 157/74 | HR 61 | Ht 62.0 in | Wt 98.0 lb

## 2021-04-28 DIAGNOSIS — I10 Essential (primary) hypertension: Secondary | ICD-10-CM | POA: Diagnosis not present

## 2021-04-28 DIAGNOSIS — I2510 Atherosclerotic heart disease of native coronary artery without angina pectoris: Secondary | ICD-10-CM

## 2021-04-28 DIAGNOSIS — I7300 Raynaud's syndrome without gangrene: Secondary | ICD-10-CM | POA: Diagnosis not present

## 2021-04-28 DIAGNOSIS — E782 Mixed hyperlipidemia: Secondary | ICD-10-CM

## 2021-04-28 DIAGNOSIS — I6523 Occlusion and stenosis of bilateral carotid arteries: Secondary | ICD-10-CM

## 2021-06-19 ENCOUNTER — Encounter: Payer: Self-pay | Admitting: Emergency Medicine

## 2021-06-19 ENCOUNTER — Other Ambulatory Visit: Payer: Self-pay

## 2021-06-19 ENCOUNTER — Emergency Department
Admission: EM | Admit: 2021-06-19 | Discharge: 2021-06-19 | Disposition: A | Discharge status: Home / Self Care | Payer: Medicare Other | Attending: Emergency Medicine | Admitting: Emergency Medicine

## 2021-06-19 DIAGNOSIS — R1012 Left upper quadrant pain: Secondary | ICD-10-CM | POA: Insufficient documentation

## 2021-06-19 DIAGNOSIS — Z5321 Procedure and treatment not carried out due to patient leaving prior to being seen by health care provider: Secondary | ICD-10-CM | POA: Insufficient documentation

## 2021-06-19 DIAGNOSIS — R61 Generalized hyperhidrosis: Secondary | ICD-10-CM | POA: Diagnosis not present

## 2021-06-19 LAB — COMPREHENSIVE METABOLIC PANEL
ALT: 13 U/L (ref 0–44)
AST: 21 U/L (ref 15–41)
Albumin: 3.5 g/dL (ref 3.5–5.0)
Alkaline Phosphatase: 72 U/L (ref 38–126)
Anion gap: 8 (ref 5–15)
BUN: 22 mg/dL (ref 8–23)
CO2: 29 mmol/L (ref 22–32)
Calcium: 9.5 mg/dL (ref 8.9–10.3)
Chloride: 104 mmol/L (ref 98–111)
Creatinine, Ser: 1.15 mg/dL — ABNORMAL HIGH (ref 0.44–1.00)
GFR, Estimated: 48 mL/min — ABNORMAL LOW (ref >60)
Glucose, Bld: 123 mg/dL — ABNORMAL HIGH (ref 70–99)
Potassium: 3.6 mmol/L (ref 3.5–5.1)
Sodium: 141 mmol/L (ref 135–145)
Total Bilirubin: 0.4 mg/dL (ref 0.3–1.2)
Total Protein: 6.4 g/dL — ABNORMAL LOW (ref 6.5–8.1)

## 2021-06-19 LAB — LIPASE, BLOOD: Lipase: 37 U/L (ref 11–51)

## 2021-06-19 LAB — CBC
HCT: 38.1 % (ref 36.0–46.0)
Hemoglobin: 12.1 g/dL (ref 12.0–15.0)
MCH: 26.8 pg (ref 26.0–34.0)
MCHC: 31.8 g/dL (ref 30.0–36.0)
MCV: 84.5 fL (ref 80.0–100.0)
Platelets: 312 10*3/uL (ref 150–400)
RBC: 4.51 MIL/uL (ref 3.87–5.11)
RDW: 15.2 % (ref 11.5–15.5)
WBC: 8.6 10*3/uL (ref 4.0–10.5)
nRBC: 0 % (ref 0.0–0.2)

## 2021-06-19 MED ORDER — ACETAMINOPHEN 325 MG PO TABS
ORAL_TABLET | ORAL | Status: AC
  Administered 2021-06-19: 650 mg via ORAL
  Filled 2021-06-19: qty 2

## 2021-06-19 MED ORDER — ACETAMINOPHEN 325 MG PO TABS: 650.0000 mg | ORAL_TABLET | Freq: Once | ORAL | Status: AC

## 2021-06-19 NOTE — ED Notes (Signed)
Pt spouse reports pt is feeling better reports they are going home RN encouraged to stay they decided to leave

## 2021-06-19 NOTE — ED Triage Notes (Signed)
Pt reports she woke up from sleep with LUQ pain , denies any nausea or vomiting. Pt diaphoretic in triage. Pt reports las BM Friday .

## 2021-07-04 DIAGNOSIS — R413 Other amnesia: Secondary | ICD-10-CM | POA: Insufficient documentation

## 2021-09-05 ENCOUNTER — Other Ambulatory Visit: Payer: Self-pay | Admitting: Internal Medicine

## 2021-09-05 DIAGNOSIS — Z1231 Encounter for screening mammogram for malignant neoplasm of breast: Secondary | ICD-10-CM

## 2021-10-04 ENCOUNTER — Ambulatory Visit
Admission: RE | Admit: 2021-10-04 | Discharge: 2021-10-04 | Disposition: A | Discharge status: Home / Self Care | Payer: Medicare Other | Source: Ambulatory Visit | Attending: Internal Medicine | Admitting: Internal Medicine

## 2021-10-04 ENCOUNTER — Other Ambulatory Visit: Payer: Self-pay

## 2021-10-04 ENCOUNTER — Other Ambulatory Visit: Payer: Self-pay | Admitting: Internal Medicine

## 2021-10-04 DIAGNOSIS — S0990XA Unspecified injury of head, initial encounter: Secondary | ICD-10-CM | POA: Insufficient documentation

## 2021-10-20 ENCOUNTER — Other Ambulatory Visit: Payer: Self-pay

## 2021-10-20 ENCOUNTER — Ambulatory Visit
Admission: RE | Admit: 2021-10-20 | Discharge: 2021-10-20 | Disposition: A | Discharge status: Home / Self Care | Payer: Medicare Other | Source: Ambulatory Visit | Attending: Internal Medicine | Admitting: Internal Medicine

## 2021-10-20 DIAGNOSIS — Z1231 Encounter for screening mammogram for malignant neoplasm of breast: Secondary | ICD-10-CM | POA: Diagnosis present

## 2021-11-21 ENCOUNTER — Inpatient Hospital Stay: Payer: Medicare Other | Attending: Internal Medicine

## 2021-11-21 ENCOUNTER — Inpatient Hospital Stay: Payer: Medicare Other | Admitting: Internal Medicine

## 2021-11-21 ENCOUNTER — Encounter: Payer: Self-pay | Admitting: Internal Medicine

## 2021-11-21 DIAGNOSIS — Z853 Personal history of malignant neoplasm of breast: Secondary | ICD-10-CM | POA: Insufficient documentation

## 2021-11-21 DIAGNOSIS — Z170 Estrogen receptor positive status [ER+]: Secondary | ICD-10-CM | POA: Diagnosis not present

## 2021-11-21 DIAGNOSIS — Z8673 Personal history of transient ischemic attack (TIA), and cerebral infarction without residual deficits: Secondary | ICD-10-CM | POA: Insufficient documentation

## 2021-11-21 DIAGNOSIS — C50812 Malignant neoplasm of overlapping sites of left female breast: Secondary | ICD-10-CM

## 2021-11-21 DIAGNOSIS — N183 Chronic kidney disease, stage 3 unspecified: Secondary | ICD-10-CM | POA: Insufficient documentation

## 2021-11-21 DIAGNOSIS — Z08 Encounter for follow-up examination after completed treatment for malignant neoplasm: Secondary | ICD-10-CM | POA: Diagnosis present

## 2021-11-21 LAB — CBC WITH DIFFERENTIAL/PLATELET
Abs Immature Granulocytes: 0.05 10*3/uL (ref 0.00–0.07)
Basophils Absolute: 0.1 10*3/uL (ref 0.0–0.1)
Basophils Relative: 1 %
Eosinophils Absolute: 0.1 10*3/uL (ref 0.0–0.5)
Eosinophils Relative: 2 %
HCT: 42.8 % (ref 36.0–46.0)
Hemoglobin: 13 g/dL (ref 12.0–15.0)
Immature Granulocytes: 1 %
Lymphocytes Relative: 18 %
Lymphs Abs: 1.4 10*3/uL (ref 0.7–4.0)
MCH: 25.9 pg — ABNORMAL LOW (ref 26.0–34.0)
MCHC: 30.4 g/dL (ref 30.0–36.0)
MCV: 85.4 fL (ref 80.0–100.0)
Monocytes Absolute: 0.9 10*3/uL (ref 0.1–1.0)
Monocytes Relative: 12 %
Neutro Abs: 5 10*3/uL (ref 1.7–7.7)
Neutrophils Relative %: 66 %
Platelets: 256 10*3/uL (ref 150–400)
RBC: 5.01 MIL/uL (ref 3.87–5.11)
RDW: 14.6 % (ref 11.5–15.5)
WBC: 7.5 10*3/uL (ref 4.0–10.5)
nRBC: 0 % (ref 0.0–0.2)

## 2021-11-21 LAB — COMPREHENSIVE METABOLIC PANEL
ALT: 17 U/L (ref 0–44)
AST: 26 U/L (ref 15–41)
Albumin: 3.8 g/dL (ref 3.5–5.0)
Alkaline Phosphatase: 74 U/L (ref 38–126)
Anion gap: 3 — ABNORMAL LOW (ref 5–15)
BUN: 18 mg/dL (ref 8–23)
CO2: 32 mmol/L (ref 22–32)
Calcium: 8.9 mg/dL (ref 8.9–10.3)
Chloride: 101 mmol/L (ref 98–111)
Creatinine, Ser: 1.03 mg/dL — ABNORMAL HIGH (ref 0.44–1.00)
GFR, Estimated: 55 mL/min — ABNORMAL LOW (ref >60)
Glucose, Bld: 110 mg/dL — ABNORMAL HIGH (ref 70–99)
Potassium: 4.4 mmol/L (ref 3.5–5.1)
Sodium: 136 mmol/L (ref 135–145)
Total Bilirubin: 0.3 mg/dL (ref 0.3–1.2)
Total Protein: 6.6 g/dL (ref 6.5–8.1)

## 2021-11-21 NOTE — Progress Notes (Signed)
[place] ?OFFICE PROGRESS NOTE ? ?Patient Care Team: ?Idelle Crouch, MD as PCP - General (Internal Medicine) ? ? ?SUMMARY OF ONCOLOGIC HISTORY: ? ? ?Oncology History Overview Note  ?2009- LEFT BREAST CA STAGE II A [T2-2.5CM; N0] ER/PR POS; Her- 2 NEU NEG ONCOTYPE- 48 [high risk];s/p Lumepc & SLNBx-NEG; s/p RT; s/p chemo;  AI x 3 years [stopped in 2012- intolerance] ? 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 ? ?  ?Carcinoma of overlapping sites of left breast in female, estrogen receptor positive ([place])  ? ?INTERVAL HISTORY: Ambulating independently.  Accompanied by her husband. ? ?A very pleasant 81 year-old female patient with above history of stage II breast cancer is here for follow-up. ? ?No nausea no vomiting.  No headaches.  No fever no chills.  No bone pain.  No worsening joint pains. ? ?Review of Systems  ?Constitutional:  Positive for malaise/fatigue. Negative for chills, diaphoresis, fever and weight loss.  ?HENT:  Negative for nosebleeds and sore throat.   ?Eyes:  Negative for double vision.  ?Respiratory:  Negative for cough, hemoptysis, sputum production, shortness of breath and wheezing.   ?Cardiovascular:  Negative for chest pain, palpitations, orthopnea and leg swelling.  ?Gastrointestinal:  Negative for abdominal pain, blood in stool, constipation, diarrhea, heartburn, melena, nausea and vomiting.  ?Genitourinary:  Negative for dysuria, frequency and urgency.  ?Musculoskeletal:  Positive for back pain and joint pain.  ?Skin: Negative.  Negative for itching and rash.  ?Neurological:  Negative for dizziness, tingling, focal weakness, weakness and headaches.  ?Endo/Heme/Allergies:  Does not bruise/bleed easily.  ?Psychiatric/Behavioral:  Negative for depression. The patient is not nervous/anxious and does not have insomnia.   ? ? ?PAST MEDICAL HISTORY :  ?Past Medical History:  ?Diagnosis Date  ??  Arthritis   ?? Breast cancer [facility]) 2009  ?  2009 breast cancer stage IIA T2 N0, ER positive, HER-2 negative, 2.5 cm primary tumor, poorly differentiated, status post wide local excision, one sentinel lymph node negative, high-risk Oncotype score 48  ?? Cataracts, both eyes   ?? Colitis   ?? Complication of anesthesia   ? confusion  ?? Coronary artery disease   ?? Detached vitreous humor   ?? Diverticulitis   ?? Female bladder prolapse   ?? Gastritis   ?? GERD (gastroesophageal reflux disease)   ?? 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 Leaking  ?? Hemorrhoid   ?? Hemorrhoids   ?? History of kidney stones   ?? Hyperlipemia   ?? 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 ERPR POSITIVE 2.5CM GRADE 3, STAGE 2  ?? Kidney cysts   ? Left  ?? Kidney stones   ?? Lipoma of shoulder 2009  ? left  ?? Migraine   ?? Osteoarthritis   ?? Osteopenia   ?? Stented coronary artery   ?? Stroke [facility])   ? ? ?PAST SURGICAL HISTORY :   ?Past Surgical History:  ?Procedure Laterality Date  ?? ABDOMINAL HYSTERECTOMY    ?? benign breast biopsy  2007  ?? BREAST BIOPSY Left 2007  ? positive  ?? BREAST CYST ASPIRATION Right   ? neg  ?? BREAST LUMPECTOMY Left 2009  ?  2009 breast cancer stage IIA T2 N0, ER positive, HER-2 negative, 2.5 cm primary tumor, poorly differentiated, status post wide local excision, one sentinel lymph node negative, high-risk Oncotype score 48  ?? BREAST SURGERY    ??  CARDIAC CATHETERIZATION N/A 06/22/2015  ? Procedure: Left Heart Cath and Coronary Angiography;  Surgeon: Teodoro Spray, MD;  Location: [place];  Service: Cardiovascular;  Laterality: N/A;  ?? CARDIAC CATHETERIZATION    ?? CATARACT EXTRACTION    ?? COLONOSCOPY    ?? CORONARY ANGIOPLASTY    ?? ENDARTERECTOMY Right 12/26/2017  ? Procedure: ENDARTERECTOMY CAROTID;  Surgeon: Katha Cabal, MD;  Location: [facility] ORS;  Service: Vascular;  Laterality: Right;  ?? EYE SURGERY    ?? FOOT FRACTURE SURGERY Left   ?? PARTIAL HYSTERECTOMY    ?? UPPER  GASTROINTESTINAL ENDOSCOPY    ? ? ?FAMILY HISTORY :   ?Family History  ?Problem Relation Age of Onset  ?? Heart disease Mother   ?? Diabetes Father   ?? Heart disease Father   ?? Diabetes Sister   ?? Diabetes Brother   ?? Cervical cancer Cousin   ?? Breast cancer Cousin   ?? Lung cancer Paternal Aunt   ?? Prostate cancer Maternal Uncle   ?? Leukemia Other   ?? Hypertension Other   ? ? ?SOCIAL HISTORY:   ?Social History  ? ?Tobacco Use  ?? Smoking status: Former  ?  Types: Cigarettes  ?  Quit date: 08/14/1993  ?  Years since quitting: 28.2  ?? Smokeless tobacco: Never  ?Vaping Use  ?? Vaping Use: Never used  ?Substance Use Topics  ?? Alcohol use: Not Currently  ?  Alcohol/week: 0.0 standard drinks  ?  Comment: rare  ?? Drug use: No  ? ? ?ALLERGIES:  is allergic to alendronate sodium, atorvastatin, azithromycin, ciprofloxacin, colestipol, lidocaine, lovastatin, metronidazole, niacin, risedronate sodium, rosuvastatin, and simvastatin. ? ?MEDICATIONS:  ?Current Outpatient Medications  ?Medication Sig Dispense Refill  ?? ALPRAZolam (XANAX) 0.25 MG tablet TAKE ONE-HALF TABLET BY MOUTH AT BEDTIME    ?? aspirin EC 81 MG tablet Take 81 mg by mouth daily.     ?? butalbital-acetaminophen-caffeine (FIORICET, ESGIC) 50-325-40 MG tablet Take 1-2 tablets by mouth daily as needed for headache.    ?? docusate sodium (COLACE) 100 MG capsule Take 100 mg by mouth daily as needed for mild constipation.     ?? ezetimibe (ZETIA) 10 MG tablet Take 10 mg by mouth daily.     ?? hydroxychloroquine (PLAQUENIL) 200 MG tablet Take 1 tablet by mouth once daily    ?? melatonin 3 MG TABS tablet Take by mouth.    ?? memantine (NAMENDA) 5 MG tablet Take 5 mg by mouth daily.    ?? metoprolol succinate (TOPROL-XL) 25 MG 24 hr tablet Take 25 mg by mouth daily.     ?? nitroGLYCERIN (NITROSTAT) 0.4 MG SL tablet Place 0.4 mg under the tongue every 5 (five) minutes as needed for chest pain.    ?? omeprazole (PRILOSEC) 20 MG capsule Take 20 mg by mouth 2 (two)  times daily before a meal.    ?? sodium chloride (MURO 128) 5 % ophthalmic solution Place 1 drop into the left eye at bedtime.    ?? venlafaxine XR (EFFEXOR-XR) 37.5 MG 24 hr capsule Take 37.5 mg by mouth daily.    ?? amLODipine (NORVASC) 2.5 MG tablet Take by mouth.    ?? Azelaic Acid 15 % cream Apply 1 application topically daily.  (Patient not taking: Reported on 11/21/2021)    ?? hydrocortisone 2.5 % cream Apply topically. (Patient not taking: Reported on 11/21/2021)    ? ?No current facility-administered medications for this visit.  ? ? ?PHYSICAL EXAMINATION: ?ECOG PERFORMANCE  STATUS: 0 - Asymptomatic ? ?BP 119/65 (BP Location: Right Arm, Patient Position: Sitting, Cuff Size: Normal)   Pulse 63   Temp 98.3 ?F (36.8 ?C) (Oral)   Ht $R'5\' 2"'Ns$  (1.575 m)   Wt 100 lb 11.2 oz (45.7 kg)   SpO2 95%   BMI 18.42 kg/m?  ? ?Filed Weights  ? 11/21/21 1015  ?Weight: 100 lb 11.2 oz (45.7 kg)  ? ?Physical Exam ?HENT:  ?   Head: Normocephalic and atraumatic.  ?   Mouth/Throat:  ?   Pharynx: No oropharyngeal exudate.  ?Eyes:  ?   Pupils: Pupils are equal, round, and reactive to light.  ?Cardiovascular:  ?   Rate and Rhythm: Normal rate and regular rhythm.  ?Pulmonary:  ?   Effort: Pulmonary effort is normal. No respiratory distress.  ?   Breath sounds: Normal breath sounds. No wheezing.  ?Abdominal:  ?   General: Bowel sounds are normal. There is no distension.  ?   Palpations: Abdomen is soft. There is no mass.  ?   Tenderness: There is no abdominal tenderness. There is no guarding or rebound.  ?Musculoskeletal:     ?   General: No tenderness. Normal range of motion.  ?   Cervical back: Normal range of motion and neck supple.  ?Skin: ?   General: Skin is warm.  ?Neurological:  ?   Mental Status: She is alert and oriented to person, place, and time.  ?Psychiatric:     ?   Mood and Affect: Affect normal.  ? ? ? ?LABORATORY DATA:  ?I have reviewed the data as listed ?   ?Component Value Date/Time  ? NA 136 11/21/2021 0955  ? NA  143 12/31/2013 0406  ? K 4.4 11/21/2021 0955  ? K 4.5 12/31/2013 0406  ? CL 101 11/21/2021 0955  ? CL 111 (H) 12/31/2013 0406  ? CO2 32 11/21/2021 0955  ? CO2 28 12/31/2013 0406  ? GLUCOSE 110 (H) 11/21/2021 0955  ? GLUCOSE 1

## 2021-11-21 NOTE — Assessment & Plan Note (Addendum)
#  Breast cancer- stage II ER/PR positive HER-2/neu negative high risk Oncotype status post chemotherapy [2009]; adjuvant hormone therapy until 2012.  ? ?#Clinically no evidence of recurrence/mammogram [Dr.Sparks] MARCH 2023-  within normal limits. ? ?#'Acute stroke-[march 2019]-s/p CEA- STABLE. ? ?# OSTOPENIA: 2021 [PCP]- T score -2.3- continue follow up with PCP.  ? ?# CKD stage III- GFR 54; STABLE.  ? ?# Recommend follow-up in one year; mammogram will be ordered through her PCP [Dr.Sparks]. Prefers to keep follow up with [place].  ? ?#DISPOSIITION: ?# follow up in 12 months-MD/cbc/cmp-Dr.B ? ?Cc; Dr.Sparks ?

## 2021-11-21 NOTE — Progress Notes (Signed)
Spouse would like to know since it's been 14 years since she was dx, does the bp still need to be gotten in the rt arm only? ?

## 2022-01-19 DIAGNOSIS — R7303 Prediabetes: Secondary | ICD-10-CM | POA: Insufficient documentation

## 2022-02-02 ENCOUNTER — Emergency Department
Admission: EM | Admit: 2022-02-02 | Discharge: 2022-02-02 | Disposition: A | Discharge status: Home / Self Care | Payer: Medicare Other | Attending: Emergency Medicine | Admitting: Emergency Medicine

## 2022-02-02 ENCOUNTER — Encounter: Payer: Self-pay | Admitting: Emergency Medicine

## 2022-02-02 ENCOUNTER — Emergency Department: Payer: Medicare Other

## 2022-02-02 ENCOUNTER — Other Ambulatory Visit: Payer: Self-pay

## 2022-02-02 DIAGNOSIS — Y9301 Activity, walking, marching and hiking: Secondary | ICD-10-CM | POA: Diagnosis not present

## 2022-02-02 DIAGNOSIS — W25XXXA Contact with sharp glass, initial encounter: Secondary | ICD-10-CM | POA: Diagnosis not present

## 2022-02-02 DIAGNOSIS — S41111A Laceration without foreign body of right upper arm, initial encounter: Secondary | ICD-10-CM | POA: Insufficient documentation

## 2022-02-02 DIAGNOSIS — S4991XA Unspecified injury of right shoulder and upper arm, initial encounter: Secondary | ICD-10-CM | POA: Diagnosis present

## 2022-02-02 DIAGNOSIS — S0990XA Unspecified injury of head, initial encounter: Secondary | ICD-10-CM | POA: Insufficient documentation

## 2022-02-02 DIAGNOSIS — F0390 Unspecified dementia without behavioral disturbance: Secondary | ICD-10-CM

## 2022-02-02 DIAGNOSIS — F01511 Vascular dementia, unspecified severity, with agitation: Secondary | ICD-10-CM | POA: Diagnosis not present

## 2022-02-02 DIAGNOSIS — Z23 Encounter for immunization: Secondary | ICD-10-CM | POA: Diagnosis not present

## 2022-02-02 DIAGNOSIS — F03911 Unspecified dementia, unspecified severity, with agitation: Secondary | ICD-10-CM | POA: Insufficient documentation

## 2022-02-02 LAB — CBC WITH DIFFERENTIAL/PLATELET
Abs Immature Granulocytes: 0.1 10*3/uL — ABNORMAL HIGH (ref 0.00–0.07)
Basophils Absolute: 0 10*3/uL (ref 0.0–0.1)
Basophils Relative: 0 %
Eosinophils Absolute: 0 10*3/uL (ref 0.0–0.5)
Eosinophils Relative: 0 %
HCT: 40 % (ref 36.0–46.0)
Hemoglobin: 12.5 g/dL (ref 12.0–15.0)
Immature Granulocytes: 1 %
Lymphocytes Relative: 8 %
Lymphs Abs: 0.8 10*3/uL (ref 0.7–4.0)
MCH: 26 pg (ref 26.0–34.0)
MCHC: 31.3 g/dL (ref 30.0–36.0)
MCV: 83.3 fL (ref 80.0–100.0)
Monocytes Absolute: 1 10*3/uL (ref 0.1–1.0)
Monocytes Relative: 9 %
Neutro Abs: 9.1 10*3/uL — ABNORMAL HIGH (ref 1.7–7.7)
Neutrophils Relative %: 82 %
Platelets: 238 10*3/uL (ref 150–400)
RBC: 4.8 MIL/uL (ref 3.87–5.11)
RDW: 15.1 % (ref 11.5–15.5)
WBC: 11 10*3/uL — ABNORMAL HIGH (ref 4.0–10.5)
nRBC: 0 % (ref 0.0–0.2)

## 2022-02-02 LAB — COMPREHENSIVE METABOLIC PANEL
ALT: 24 U/L (ref 0–44)
AST: 40 U/L (ref 15–41)
Albumin: 4.2 g/dL (ref 3.5–5.0)
Alkaline Phosphatase: 92 U/L (ref 38–126)
Anion gap: 12 (ref 5–15)
BUN: 21 mg/dL (ref 8–23)
CO2: 27 mmol/L (ref 22–32)
Calcium: 9.9 mg/dL (ref 8.9–10.3)
Chloride: 101 mmol/L (ref 98–111)
Creatinine, Ser: 0.99 mg/dL (ref 0.44–1.00)
GFR, Estimated: 58 mL/min — ABNORMAL LOW (ref >60)
Glucose, Bld: 125 mg/dL — ABNORMAL HIGH (ref 70–99)
Potassium: 3.5 mmol/L (ref 3.5–5.1)
Sodium: 140 mmol/L (ref 135–145)
Total Bilirubin: 0.8 mg/dL (ref 0.3–1.2)
Total Protein: 7.2 g/dL (ref 6.5–8.1)

## 2022-02-02 LAB — URINALYSIS, ROUTINE W REFLEX MICROSCOPIC
Bilirubin Urine: NEGATIVE
Glucose, UA: NEGATIVE mg/dL
Ketones, ur: 5 mg/dL — AB
Leukocytes,Ua: NEGATIVE
Nitrite: NEGATIVE
Protein, ur: NEGATIVE mg/dL
Specific Gravity, Urine: 1.006 (ref 1.005–1.030)
Squamous Epithelial / HPF: NONE SEEN (ref 0–5)
pH: 6 (ref 5.0–8.0)

## 2022-02-02 MED ORDER — DIPHENHYDRAMINE HCL 50 MG/ML IJ SOLN: 50.0000 mg | Freq: Once | INTRAMUSCULAR | Status: DC

## 2022-02-02 MED ORDER — TETANUS-DIPHTH-ACELL PERTUSSIS 5-2.5-18.5 LF-MCG/0.5 IM SUSY
0.5000 mL | PREFILLED_SYRINGE | Freq: Once | INTRAMUSCULAR | Status: AC
Start: 2022-02-02 — End: 2022-02-02
  Administered 2022-02-02: 0.5 mL via INTRAMUSCULAR
  Filled 2022-02-02: qty 0.5

## 2022-02-02 MED ORDER — OLANZAPINE 5 MG PO TBDP
2.5000 mg | ORAL_TABLET | Freq: Once | ORAL | Status: AC
Start: 2022-02-02 — End: 2022-02-02
  Administered 2022-02-02: 2.5 mg via ORAL
  Filled 2022-02-02: qty 1

## 2022-02-02 MED ORDER — OLANZAPINE 2.5 MG PO TABS: 2.5000 mg | ORAL_TABLET | Freq: Every day | ORAL | 2 refills | Status: DC

## 2022-02-02 MED ORDER — LORAZEPAM 0.5 MG PO TABS
0.5000 mg | ORAL_TABLET | Freq: Once | ORAL | Status: AC
Start: 2022-02-02 — End: 2022-02-02
  Administered 2022-02-02: 0.5 mg via ORAL
  Filled 2022-02-02: qty 1

## 2022-02-02 MED ORDER — LIDOCAINE HCL (PF) 1 % IJ SOLN
INTRAMUSCULAR | Status: AC
  Filled 2022-02-02: qty 5

## 2022-02-02 MED ORDER — DIPHENHYDRAMINE HCL 50 MG/ML IJ SOLN: 12.5000 mg | Freq: Once | INTRAMUSCULAR | Status: DC

## 2022-02-02 NOTE — ED Notes (Signed)
See triage note  Skin tear noted under  R forearm area, bleeding controlled, sutures probably needed.   Husband with pt at this time.

## 2022-02-02 NOTE — ED Notes (Signed)
EMS transport called.

## 2022-02-02 NOTE — ED Notes (Signed)
Report to [place] at [facility]
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
## 2022-02-02 NOTE — Discharge Instructions (Signed)
Keep the wound clean and dry.  Change dressing every day or 2.  Return for any signs of infection including increased redness or swelling or pus.  Stitches can come out in 7 to 10 days. Try the Zyprexa 2.5 mg orally disintegrating tablet once a day.  Probably the best thing to do would be started at night.  She did get a dose today 22 June already.  Please return as needed.  Follow-up with your regular doctor.

## 2022-02-02 NOTE — Consult Note (Signed)
[place] [facility] Consult   Reason for Consult:  dementia with behavioral disturbance Referring Physician:  Cinda Quest Patient Identification: Kathleen Walls MRN:  [id number] Principal Diagnosis: Dementia [facility]) Diagnosis:  Principal Problem:   Dementia ([place])   Total Time spent with patient: 45 minutes  Subjective:   Kathleen Walls is a 81 y.o. female patient admitted with dementia with behavioral disturbance.  HPI: Patient presents to ED from memory care facility at the [facility], where she has been living for 2 days.  Apparently, patient has been very confused today throwing glasses, agitated.  EMS was called to the facility where they found patient with a significant "skin tear" on her right forearm.  She was calm and cooperative when EMS brought her into hospital.  Injury to arm required sutures, which ED physician was attending to during writer's evaluation.  On evaluation, patient is calm.  She is oriented to self, place, and husband at her bedside.  She does not recall the situation that brought her in.  She states that she lives at home with her husband.  She tells Probation officer she does not know the year.  Does not appear to remember that she has moved to a memory care facility.  She is disorganized in her expression of thought, does not form meaningful sentences but responds appropriately with short words at times.  Patient is able to respond "thank you" when complemented.  She allows Probation officer to speak with husband independently.  Collateral from husband reveals that patient was diagnosed with "Alzheimer's dementia" about 4 years ago.  He has kept her at home, but during the last 3 to 4 months she has been getting out of the house and running down the street. She also has experienced hallucinations, auditory and visual.  Poor sleep at night.  She has used tree limbs that were on the ground outside as weapons, believing that her husband was someone else and is trying to hurt her.  Has  been with the decision to place her in a memory care unit and she has been at the [facility] for only a couple of days.  He believes she is having a difficult time adjusting there.  Discussed with husband that she does not need hospitalization but we will give her a small dose of  Zyprexa here and recommend that the facility consider prescribing for her, for agitation, and may help with her sleep. Husband to follow up with facility provider or patient's outpatient provider.   Past Psychiatric History: none reported  Risk to Self:   Risk to Others:   Prior Inpatient Therapy:   Prior Outpatient Therapy:    Past Medical History:  Past Medical History:  Diagnosis Date   Arthritis    Breast cancer ([place]) 2009    2009 breast cancer stage IIA T2 N0, ER positive, HER-2 negative, 2.5 cm primary tumor, poorly differentiated, status post wide local excision, one sentinel lymph node negative, high-risk Oncotype score 48   Cataracts, both eyes    Colitis    Complication of anesthesia    confusion   Coronary artery disease    Detached vitreous humor    Diverticulitis    Female bladder prolapse    Gastritis    GERD (gastroesophageal reflux disease)    Heart valve problem 06/19/2013   Leaking   Hemorrhoid    Hemorrhoids    History of kidney stones    Hyperlipemia    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, STAGE 2   Kidney cysts    Left   Kidney stones    Lipoma of shoulder 2009   left   Migraine    Osteoarthritis    Osteopenia    Stented coronary artery    Stroke [facility])     Past Surgical History:  Procedure Laterality Date   ABDOMINAL HYSTERECTOMY     benign breast biopsy  2007   BREAST BIOPSY Left 2007   positive   BREAST CYST ASPIRATION Right    neg   BREAST LUMPECTOMY Left 2009    2009 breast cancer stage IIA T2 N0, ER positive, HER-2 negative, 2.5 cm primary tumor, poorly differentiated, status post wide local excision, one sentinel lymph node  negative, high-risk Oncotype score 48   BREAST SURGERY     CARDIAC CATHETERIZATION N/A 06/22/2015   Procedure: Left Heart Cath and Coronary Angiography;  Surgeon: Teodoro Spray, MD;  Location: [place] [facility];  Service: Cardiovascular;  Laterality: N/A;   CARDIAC CATHETERIZATION     CATARACT EXTRACTION     COLONOSCOPY     CORONARY ANGIOPLASTY     ENDARTERECTOMY Right 12/26/2017   Procedure: ENDARTERECTOMY CAROTID;  Surgeon: Katha Cabal, MD;  Location: [facility] ORS;  Service: Vascular;  Laterality: Right;   EYE SURGERY     FOOT FRACTURE SURGERY Left    PARTIAL HYSTERECTOMY     UPPER GASTROINTESTINAL ENDOSCOPY     Family History:  Family History  Problem Relation Age of Onset   Heart disease Mother    Diabetes Father    Heart disease Father    Diabetes Sister    Diabetes Brother    Cervical cancer Cousin    Breast cancer Cousin    Lung cancer Paternal Aunt    Prostate cancer Maternal Uncle    Leukemia Other    Hypertension Other    Family Psychiatric  History:  Social History:  Social History   Substance and Sexual Activity  Alcohol Use Not Currently   Alcohol/week: 0.0 standard drinks of alcohol   Comment: rare     Social History   Substance and Sexual Activity  Drug Use No    Social History   Socioeconomic History   Marital status: Married    Spouse name: Not on file   Number of children: Not on file   Years of education: Not on file   Highest education level: Not on file  Occupational History   Not on file  Tobacco Use   Smoking status: Former    Types: Cigarettes    Quit date: 08/14/1993    Years since quitting: 28.4   Smokeless tobacco: Never  Vaping Use   Vaping Use: Never used  Substance and Sexual Activity   Alcohol use: Not Currently    Alcohol/week: 0.0 standard drinks of alcohol    Comment: rare   Drug use: No   Sexual activity: Yes    Birth control/protection: None, Post-menopausal  Other Topics Concern   Not on file  Social  History Narrative   Not on file   Social Determinants of Health   Financial Resource Strain: Not on file  Food Insecurity: Not on file  Transportation Needs: Not on file  Physical Activity: Not on file  Stress: Not on file  Social Connections: Not on file   Additional Social History:    Allergies:   Allergies  Allergen Reactions   Alendronate Sodium Other (See Comments)    Unknown  Atorvastatin Other (See Comments)    Muscle Pain   Azithromycin Hives   Ciprofloxacin Other (See Comments)    Headache   Colestipol Other (See Comments)    Unknown   Lidocaine Hives   Lovastatin Other (See Comments)    Headache   Metronidazole Other (See Comments)    Headache   Niacin Other (See Comments)    Unknown   Risedronate Sodium Other (See Comments)    Unknown   Rosuvastatin Other (See Comments)    Unknown   Simvastatin Other (See Comments)    Headache    Labs:  Results for orders placed or performed during the hospital encounter of 02/02/22 (from the past 48 hour(s))  Comprehensive metabolic panel     Status: Abnormal   Collection Time: 02/02/22  9:38 AM  Result Value Ref Range   Sodium 140 135 - 145 mmol/L   Potassium 3.5 3.5 - 5.1 mmol/L   Chloride 101 98 - 111 mmol/L   CO2 27 22 - 32 mmol/L   Glucose, Bld 125 (H) 70 - 99 mg/dL    Comment: Glucose reference range applies only to samples taken after fasting for at least 8 hours.   BUN 21 8 - 23 mg/dL   Creatinine, Ser 0.99 0.44 - 1.00 mg/dL   Calcium 9.9 8.9 - 10.3 mg/dL   Total Protein 7.2 6.5 - 8.1 g/dL   Albumin 4.2 3.5 - 5.0 g/dL   AST 40 15 - 41 U/L   ALT 24 0 - 44 U/L   Alkaline Phosphatase 92 38 - 126 U/L   Total Bilirubin 0.8 0.3 - 1.2 mg/dL   GFR, Estimated 58 (L) >60 mL/min    Comment: (NOTE) Calculated using the CKD-EPI Creatinine Equation (2021)    Anion gap 12 5 - 15    Comment: Performed at [facility], [address] [place], [place] [address]  CBC with Differential     Status:  Abnormal   Collection Time: 02/02/22  9:38 AM  Result Value Ref Range   WBC 11.0 (H) 4.0 - 10.5 K/uL   RBC 4.80 3.87 - 5.11 MIL/uL   Hemoglobin 12.5 12.0 - 15.0 g/dL   HCT 40.0 36.0 - 46.0 %   MCV 83.3 80.0 - 100.0 fL   MCH 26.0 26.0 - 34.0 pg   MCHC 31.3 30.0 - 36.0 g/dL   RDW 15.1 11.5 - 15.5 %   Platelets 238 150 - 400 K/uL   nRBC 0.0 0.0 - 0.2 %   Neutrophils Relative % 82 %   Neutro Abs 9.1 (H) 1.7 - 7.7 K/uL   Lymphocytes Relative 8 %   Lymphs Abs 0.8 0.7 - 4.0 K/uL   Monocytes Relative 9 %   Monocytes Absolute 1.0 0.1 - 1.0 K/uL   Eosinophils Relative 0 %   Eosinophils Absolute 0.0 0.0 - 0.5 K/uL   Basophils Relative 0 %   Basophils Absolute 0.0 0.0 - 0.1 K/uL   Immature Granulocytes 1 %   Abs Immature Granulocytes 0.10 (H) 0.00 - 0.07 K/uL    Comment: Performed at [facility], [address] [place], [place] [address]  Urinalysis, Routine w reflex microscopic Urine, Clean Catch     Status: Abnormal   Collection Time: 02/02/22  9:40 AM  Result Value Ref Range   Color, Urine STRAW (A) YELLOW   APPearance CLEAR (A) CLEAR   Specific Gravity, Urine 1.006 1.005 - 1.030   pH 6.0 5.0 - 8.0   Glucose,  UA NEGATIVE NEGATIVE mg/dL   Hgb urine dipstick SMALL (A) NEGATIVE   Bilirubin Urine NEGATIVE NEGATIVE   Ketones, ur 5 (A) NEGATIVE mg/dL   Protein, ur NEGATIVE NEGATIVE mg/dL   Nitrite NEGATIVE NEGATIVE   Leukocytes,Ua NEGATIVE NEGATIVE   RBC / HPF 0-5 0 - 5 RBC/hpf   WBC, UA 0-5 0 - 5 WBC/hpf   Bacteria, UA RARE (A) NONE SEEN   Squamous Epithelial / LPF NONE SEEN 0 - 5   Mucus PRESENT     Comment: Performed at [facility], [address] [place], [place] [address]    Current Facility-Administered Medications  Medication Dose Route Frequency Provider Last Rate Last Admin   diphenhydrAMINE (BENADRYL) injection 12.5 mg  12.5 mg Intravenous Once [name], MD       Current Outpatient Medications  Medication Sig Dispense Refill   OLANZapine  (ZYPREXA) 2.5 MG tablet Take 1 tablet (2.5 mg total) by mouth at bedtime. 30 tablet 2   ALPRAZolam (XANAX) 0.25 MG tablet TAKE ONE-HALF TABLET BY MOUTH AT BEDTIME     amLODipine (NORVASC) 2.5 MG tablet Take by mouth.     aspirin EC 81 MG tablet Take 81 mg by mouth daily.      Azelaic Acid 15 % cream Apply 1 application topically daily.  (Patient not taking: Reported on 11/21/2021)     butalbital-acetaminophen-caffeine (FIORICET, ESGIC) 50-325-40 MG tablet Take 1-2 tablets by mouth daily as needed for headache.     docusate sodium (COLACE) 100 MG capsule Take 100 mg by mouth daily as needed for mild constipation.      ezetimibe (ZETIA) 10 MG tablet Take 10 mg by mouth daily.      hydrocortisone 2.5 % cream Apply topically. (Patient not taking: Reported on 11/21/2021)     hydroxychloroquine (PLAQUENIL) 200 MG tablet Take 1 tablet by mouth once daily     melatonin 3 MG TABS tablet Take by mouth.     memantine (NAMENDA) 5 MG tablet Take 5 mg by mouth daily.     metoprolol succinate (TOPROL-XL) 25 MG 24 hr tablet Take 25 mg by mouth daily.      nitroGLYCERIN (NITROSTAT) 0.4 MG SL tablet Place 0.4 mg under the tongue every 5 (five) minutes as needed for chest pain.     omeprazole (PRILOSEC) 20 MG capsule Take 20 mg by mouth 2 (two) times daily before a meal.     sodium chloride (MURO 128) 5 % ophthalmic solution Place 1 drop into the left eye at bedtime.     venlafaxine XR (EFFEXOR-XR) 37.5 MG 24 hr capsule Take 37.5 mg by mouth daily.      Musculoskeletal: Strength & Muscle Tone: within normal limits Gait & Station:  did not assess Patient leans: N/A  Psychiatric Specialty Exam:  Presentation  General Appearance: Appropriate for Environment  Eye Contact:Good  Speech:Clear and Coherent  Speech Volume:Normal  Handedness:No data recorded  Mood and Affect  Mood:Euthymic  Affect:Congruent   Thought Process  Thought Processes:Disorganized  Descriptions of  Associations:Loose  Orientation:Partial  Thought Content:Delusions (has dementia)  History of Schizophrenia/Schizoaffective disorder:No  Duration of Psychotic Symptoms:No data recorded Hallucinations:No data recorded Ideas of Reference:No data recorded Suicidal Thoughts:Suicidal Thoughts: No  Homicidal Thoughts:Homicidal Thoughts: No   Sensorium  Memory:Immediate Poor  Judgment:Impaired  Insight:Lacking   Executive Functions  Concentration:Fair  Attention Span:Fair  [place]   Psychomotor Activity  Psychomotor Activity:Psychomotor Activity: Normal   Assets  Assets:Financial Resources/Insurance;  Housing; Intimacy; Resilience; Social Support   Sleep  Sleep:Sleep: Poor   Physical Exam: Physical Exam Vitals and nursing note reviewed.  HENT:     Head: Normocephalic.     Nose: No congestion or rhinorrhea.  Eyes:     General:        Right eye: No discharge.        Left eye: No discharge.  Cardiovascular:     Rate and Rhythm: Normal rate.  Pulmonary:     Effort: Pulmonary effort is normal.  Musculoskeletal:        General: Normal range of motion.     Cervical back: Normal range of motion.  Skin:    General: Skin is dry.  Neurological:     Mental Status: She is alert and oriented to person, place, and time.  Psychiatric:        Behavior: Behavior normal.     Comments: Dementia    Review of Systems  Eyes: Negative.   Respiratory: Negative.    Skin:        Laceration, dorsal right forearm, getting sutures  Psychiatric/Behavioral:         Dementia   Blood pressure 135/66, pulse 88, resp. rate 16, SpO2 98 %. There is no height or weight on file to calculate BMI.  Treatment Plan Summary: Patient does not require inpatient hospitalization.  Patient did get dose of 2.5 mg of Zyprexa.  Recommend that patient follow up with her outpatient provider or the provider at the facility in which she lives.  Any  psychotic at smaller doses can be useful for behavioral management and sleep in persons with dementia.  Reviewed with EDP.  Disposition: No evidence of imminent risk to self or others at present.   Patient does not meet criteria for psychiatric inpatient admission. Discussed crisis plan, support from social network, calling 911, coming to the Emergency Department, and calling Suicide Hotline.  Sherlon Handing, NP 02/02/2022 12:19 PM

## 2022-02-02 NOTE — ED Notes (Signed)
Pt at CT

## 2022-02-13 ENCOUNTER — Emergency Department
Admission: EM | Admit: 2022-02-13 | Discharge: 2022-02-14 | Disposition: A | Discharge status: Skilled Nursing Facility | Payer: Medicare Other | Attending: Emergency Medicine | Admitting: Emergency Medicine

## 2022-02-13 ENCOUNTER — Emergency Department: Payer: Medicare Other

## 2022-02-13 DIAGNOSIS — W19XXXA Unspecified fall, initial encounter: Secondary | ICD-10-CM | POA: Diagnosis not present

## 2022-02-13 DIAGNOSIS — F0390 Unspecified dementia without behavioral disturbance: Secondary | ICD-10-CM | POA: Insufficient documentation

## 2022-02-13 DIAGNOSIS — S0990XA Unspecified injury of head, initial encounter: Secondary | ICD-10-CM | POA: Insufficient documentation

## 2022-02-13 DIAGNOSIS — Y92129 Unspecified place in nursing home as the place of occurrence of the external cause: Secondary | ICD-10-CM | POA: Insufficient documentation

## 2022-02-13 NOTE — ED Triage Notes (Signed)
Pt arrived via EMS from the [facility] due to a fall. Per EMS this was a unwitnessed fall. Pt did hit her head. Blood is present in hair. Bleeding controlled, unknown if pt takes blood thinners. Pt has dementia and per EMS this is her current baseline.

## 2022-02-13 NOTE — ED Notes (Signed)
Patient transported to CT 

## 2022-02-13 NOTE — ED Notes (Signed)
RN attempted multiple times to get a hold of staff at [facility] due to them not providing EMS with any paperwork of pt hx or current MAR or code status. RN notified MD of event.

## 2022-02-13 NOTE — ED Provider Notes (Signed)
   [facility] Provider Note    Event Date/Time   First MD Initiated Contact with Patient 02/13/22 2237     (approximate)  History   Chief Complaint: Fall  HPI  Kathleen Walls is a 81 y.o. female with a past medical history of mild dementia, hyperlipidemia, presents to the emergency department after a fall from her nursing facility.  No known blood thinners.  Family is here with the patient.  States the patient is acting well acting at her baseline.  Physical Exam   Triage Vital Signs: ED Triage Vitals  Enc Vitals Group     BP 02/13/22 2224 (!) 159/78     Pulse Rate 02/13/22 2224 78     Resp 02/13/22 2224 15     Temp 02/13/22 2224 98.5 F (36.9 C)     Temp Source 02/13/22 2224 Oral     SpO2 02/13/22 2224 98 %     Weight 02/13/22 2215 88 lb 2.9 oz (40 kg)     Height --      Head Circumference --      Peak Flow --      Pain Score 02/13/22 2215 0     Pain Loc --      Pain Edu? --      Excl. in [place]? --     Most recent vital signs: Vitals:   02/13/22 2224  BP: (!) 159/78  Pulse: 78  Resp: 15  Temp: 98.5 F (36.9 C)  SpO2: 98%    General: Awake, no distress.  Dried blood throughout the right side of her scalp. CV:  Good peripheral perfusion.  Regular rate and rhythm  Resp:  Normal effort.  Equal breath sounds bilaterally.  Abd:  No distention.  Soft, nontender.  No rebound or guarding. Other:  Good range of motion all extremities with no pain elicited.   ED Results / Procedures / Treatments   RADIOLOGY  I have reviewed and interpreted the CT images the head no acute abnormality seen on my evaluation. CT scan of the head and C-spine are negative by radiology.   MEDICATIONS ORDERED IN ED: Medications - No data to display   IMPRESSION / MDM / [place] / ED COURSE  I reviewed the triage vital signs and the nursing notes.  Patient's presentation is most consistent with acute presentation with potential threat to life or  bodily function.  Patient presents emergency department for a fall at her nursing facility.  Patient has dried blood across the right scalp.  After cleaning patient has a very small laceration that is hemostatic and nongaping.  Dermabond applied to promote continued hemostasis.  CT imaging of the head and C-spine obtained showed no acute finding.  Patient will be discharged.  Family agreeable to plan of care.  Patient was due to have her stitches removed in her right upper extremity from a fall 1 week ago.  I remove them in the emergency department.  Well-appearing wound.  FINAL CLINICAL IMPRESSION(S) / ED DIAGNOSES   Head injury Fall  Note:  This document was prepared using Dragon voice recognition software and may include unintentional dictation errors.   Harvest Dark, MD 02/13/22 [phone]

## 2022-02-14 NOTE — ED Notes (Signed)
RN attempted to give report to [facility] on pt return to facility. No answer.

## 2022-03-10 ENCOUNTER — Other Ambulatory Visit: Payer: Self-pay

## 2022-03-10 ENCOUNTER — Emergency Department: Payer: Medicare Other

## 2022-03-10 ENCOUNTER — Emergency Department
Admission: EM | Admit: 2022-03-10 | Discharge: 2022-03-10 | Disposition: A | Discharge status: Skilled Nursing Facility | Payer: Medicare Other | Attending: Emergency Medicine | Admitting: Emergency Medicine

## 2022-03-10 ENCOUNTER — Encounter: Payer: Self-pay | Admitting: Emergency Medicine

## 2022-03-10 DIAGNOSIS — I10 Essential (primary) hypertension: Secondary | ICD-10-CM | POA: Diagnosis not present

## 2022-03-10 DIAGNOSIS — S0990XA Unspecified injury of head, initial encounter: Secondary | ICD-10-CM

## 2022-03-10 DIAGNOSIS — S51011A Laceration without foreign body of right elbow, initial encounter: Secondary | ICD-10-CM | POA: Insufficient documentation

## 2022-03-10 DIAGNOSIS — W01198A Fall on same level from slipping, tripping and stumbling with subsequent striking against other object, initial encounter: Secondary | ICD-10-CM | POA: Insufficient documentation

## 2022-03-10 DIAGNOSIS — F0390 Unspecified dementia without behavioral disturbance: Secondary | ICD-10-CM | POA: Diagnosis not present

## 2022-03-10 DIAGNOSIS — T148XXA Other injury of unspecified body region, initial encounter: Secondary | ICD-10-CM

## 2022-03-10 DIAGNOSIS — E876 Hypokalemia: Secondary | ICD-10-CM | POA: Diagnosis not present

## 2022-03-10 DIAGNOSIS — Z20822 Contact with and (suspected) exposure to covid-19: Secondary | ICD-10-CM | POA: Insufficient documentation

## 2022-03-10 DIAGNOSIS — W19XXXA Unspecified fall, initial encounter: Secondary | ICD-10-CM

## 2022-03-10 LAB — URINALYSIS, ROUTINE W REFLEX MICROSCOPIC
Bilirubin Urine: NEGATIVE
Glucose, UA: NEGATIVE mg/dL
Hgb urine dipstick: NEGATIVE
Ketones, ur: NEGATIVE mg/dL
Leukocytes,Ua: NEGATIVE
Nitrite: NEGATIVE
Protein, ur: NEGATIVE mg/dL
Specific Gravity, Urine: 1.002 — ABNORMAL LOW (ref 1.005–1.030)
pH: 7 (ref 5.0–8.0)

## 2022-03-10 LAB — CBC WITH DIFFERENTIAL/PLATELET
Abs Immature Granulocytes: 0.04 10*3/uL (ref 0.00–0.07)
Basophils Absolute: 0 10*3/uL (ref 0.0–0.1)
Basophils Relative: 0 %
Eosinophils Absolute: 0.2 10*3/uL (ref 0.0–0.5)
Eosinophils Relative: 2 %
HCT: 35.9 % — ABNORMAL LOW (ref 36.0–46.0)
Hemoglobin: 11.1 g/dL — ABNORMAL LOW (ref 12.0–15.0)
Immature Granulocytes: 0 %
Lymphocytes Relative: 11 %
Lymphs Abs: 1 10*3/uL (ref 0.7–4.0)
MCH: 25.6 pg — ABNORMAL LOW (ref 26.0–34.0)
MCHC: 30.9 g/dL (ref 30.0–36.0)
MCV: 82.7 fL (ref 80.0–100.0)
Monocytes Absolute: 0.9 10*3/uL (ref 0.1–1.0)
Monocytes Relative: 10 %
Neutro Abs: 7 10*3/uL (ref 1.7–7.7)
Neutrophils Relative %: 77 %
Platelets: 250 10*3/uL (ref 150–400)
RBC: 4.34 MIL/uL (ref 3.87–5.11)
RDW: 16.3 % — ABNORMAL HIGH (ref 11.5–15.5)
WBC: 9.1 10*3/uL (ref 4.0–10.5)
nRBC: 0 % (ref 0.0–0.2)

## 2022-03-10 LAB — BASIC METABOLIC PANEL
Anion gap: 9 (ref 5–15)
BUN: 11 mg/dL (ref 8–23)
CO2: 28 mmol/L (ref 22–32)
Calcium: 9 mg/dL (ref 8.9–10.3)
Chloride: 106 mmol/L (ref 98–111)
Creatinine, Ser: 0.78 mg/dL (ref 0.44–1.00)
GFR, Estimated: >60 mL/min (ref >60)
Glucose, Bld: 113 mg/dL — ABNORMAL HIGH (ref 70–99)
Potassium: 2.7 mmol/L — CL (ref 3.5–5.1)
Sodium: 143 mmol/L (ref 135–145)

## 2022-03-10 LAB — MAGNESIUM: Magnesium: 2 mg/dL (ref 1.7–2.4)

## 2022-03-10 LAB — RESP PANEL BY RT-PCR (FLU A&B, COVID) ARPGX2
Influenza A by PCR: NEGATIVE
Influenza B by PCR: NEGATIVE
SARS Coronavirus 2 by RT PCR: NEGATIVE

## 2022-03-10 LAB — TROPONIN I (HIGH SENSITIVITY)
Troponin I (High Sensitivity): 32 ng/L — ABNORMAL HIGH (ref <18)
Troponin I (High Sensitivity): 33 ng/L — ABNORMAL HIGH (ref <18)

## 2022-03-10 LAB — CK
Total CK: 673 U/L — ABNORMAL HIGH (ref 38–234)
Total CK: 565 U/L — ABNORMAL HIGH (ref 38–234)

## 2022-03-10 MED ORDER — SODIUM CHLORIDE 0.9 % IV BOLUS
500.0000 mL | Freq: Once | INTRAVENOUS | Status: AC
  Administered 2022-03-10: 500 mL via INTRAVENOUS

## 2022-03-10 MED ORDER — POTASSIUM CHLORIDE 20 MEQ PO PACK
40.0000 meq | PACK | Freq: Once | ORAL | Status: AC
  Administered 2022-03-10: 40 meq via ORAL
  Filled 2022-03-10: qty 2

## 2022-03-10 MED ORDER — POTASSIUM CHLORIDE 10 MEQ/100ML IV SOLN
10.0000 meq | Freq: Once | INTRAVENOUS | Status: AC
  Administered 2022-03-10: 10 meq via INTRAVENOUS
  Filled 2022-03-10: qty 100

## 2022-03-10 NOTE — ED Provider Notes (Signed)
[facility] Provider Note    Event Date/Time   First MD Initiated Contact with Patient 03/10/22 1136     (approximate)   History   Fall   HPI  Kathleen Walls is a 81 y.o. female with a past medical history of dementia, crest, TIA/CVA, hyperlipidemia presents today for evaluation of unwitnessed fall.  Husband is at bedside and reports that he was called just prior to arrival.  Patient reportedly has been unsteady on her feet this week, and has recently begun using a wheelchair..  She attempted to get out of bed without using the wheelchair and had a unwitnessed fall.  It is unclear how long she was on the ground for but has been does not feel it was very long.  The staff at her assisted living facility did say that she struck her head and sustained a skin tear to her right elbow.  No loss of consciousness.  Patient has not had any vomiting.  Husband reports that she is at her mental baseline.  Patient Active Problem List   Diagnosis Date Noted   Dementia ([place]) 02/02/2022   Statin intolerance 12/03/2019   Bilateral carotid artery stenosis 07/29/2019   History of CVA (cerebrovascular accident) 07/29/2019   Pure hypercholesterolemia 04/04/2019   Calcinosis 03/27/2019   CREST (calcinosis, Raynaud's phenomenon, esophageal dysfunction, sclerodactyly, telangiectasia) ([place]) 03/27/2019   Encounter for long-term (current) use of high-risk medication 03/27/2019   Constipation, chronic 06/28/2018   Primary osteoarthritis of both hands 05/09/2018   Bilateral hand pain 01/25/2018   Raynaud's phenomenon without gangrene 01/25/2018   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   Carotid stenosis 12/26/2017   TIA (transient ischemic attack) 11/16/2017   Acute cerebrovascular accident (CVA) ([place]) 11/16/2017   Carcinoma of overlapping sites of left breast in female, estrogen receptor positive ([place]) 11/17/2016   Symptomatic carotid artery stenosis without infarction 09/11/2016   H/O  appendicitis 08/01/2016   Thyromegaly 12/22/2015   Chest pain 05/14/2015   Arteriosclerosis of coronary artery 07/22/2014   Disorder of mitral valve 01/17/2014   Absolute anemia 10/23/2013   Benign hypertension 10/23/2013   Dermatophytic onychia 10/23/2013   HLD (hyperlipidemia) 10/23/2013   Idiopathic peripheral neuropathy 10/23/2013          Physical Exam   Triage Vital Signs: ED Triage Vitals  Enc Vitals Group     BP 03/10/22 1135 (!) 135/59     Pulse --      Resp 03/10/22 1135 12     Temp 03/10/22 1135 97.7 F (36.5 C)     Temp src --      SpO2 03/10/22 1135 94 %     Weight 03/10/22 1138 88 lb 2.9 oz (40 kg)     Height 03/10/22 1138 '5\' 2"'$  (1.575 m)     Head Circumference --      Peak Flow --      Pain Score --      Pain Loc --      Pain Edu? --      Excl. in [place]? --     Most recent vital signs: Vitals:   03/10/22 1135  BP: (!) 135/59  Resp: 12  Temp: 97.7 F (36.5 C)  SpO2: 94%    Physical Exam Vitals and nursing note reviewed.  Constitutional:      General: Awake and alert. No acute distress.    Appearance: Normal appearance. The patient is underweight.  HENT:     Head: Normocephalic and atraumatic.  Mouth: Mucous membranes are moist.  Eyes:     General: PERRL. Normal EOMs        Right eye: No discharge.        Left eye: No discharge.     Conjunctiva/sclera: Conjunctivae normal.  Cardiovascular:     Rate and Rhythm: Normal rate and regular rhythm.     Pulses: Normal pulses.     Heart sounds: Normal heart sounds Pulmonary:     Effort: Pulmonary effort is normal. No respiratory distress.     Breath sounds: Normal breath sounds.  Mild tenderness to palpation to right anterior chest wall without ecchymosis, skin tear, or crepitus.  No tenderness along clavicles or AC joint bilaterally.  Normal range of motion of bilateral upper extremities, normal strength and sensation Abdominal:     Abdomen is soft. There is no abdominal tenderness. No  rebound or guarding. No distention. Musculoskeletal:        General: No swelling. Normal range of motion.     Cervical back: Normal range of motion and neck supple.  Pelvis stable.  Negative logroll bilaterally of the hip.  Able to actively and passively flex and extend both hips bilaterally without pain.  Normal range of motion of knees and ankles.  Normal distal pulses.  No rash Skin:    General: Skin is warm and dry.     Capillary Refill: Capillary refill takes less than 2 seconds.     Findings: Superficial skin tear noted to elbow.  Full and normal range of motion of elbow. Neurological:     Mental Status: The patient is awake and alert. Oriented to self, not to place or time Normal unslurred speech  Cranial nerves II through XII intact 5 out of 5 strength in all 4 extremities with intact sensation throughout No extremity drift      ED Results / Procedures / Treatments   Labs (all labs ordered are listed, but only abnormal results are displayed) Labs Reviewed  CBC WITH DIFFERENTIAL/PLATELET - Abnormal; Notable for the following components:      Result Value   Hemoglobin 11.1 (*)    HCT 35.9 (*)    MCH 25.6 (*)    RDW 16.3 (*)    All other components within normal limits  BASIC METABOLIC PANEL - Abnormal; Notable for the following components:   Potassium 2.7 (*)    Glucose, Bld 113 (*)    All other components within normal limits  CK - Abnormal; Notable for the following components:   Total CK 673 (*)    All other components within normal limits  URINALYSIS, ROUTINE W REFLEX MICROSCOPIC - Abnormal; Notable for the following components:   Color, Urine COLORLESS (*)    APPearance CLEAR (*)    Specific Gravity, Urine 1.002 (*)    All other components within normal limits  CK - Abnormal; Notable for the following components:   Total CK 565 (*)    All other components within normal limits  TROPONIN I (HIGH SENSITIVITY) - Abnormal; Notable for the following components:    Troponin I (High Sensitivity) 32 (*)    All other components within normal limits  TROPONIN I (HIGH SENSITIVITY) - Abnormal; Notable for the following components:   Troponin I (High Sensitivity) 33 (*)    All other components within normal limits  RESP PANEL BY RT-PCR (FLU A&B, COVID) ARPGX2  MAGNESIUM     EKG     RADIOLOGY I independently reviewed and interpreted imaging and agree with  radiologists findings.     PROCEDURES:  Critical Care performed:   Procedures   MEDICATIONS ORDERED IN ED: Medications  potassium chloride 10 mEq in 100 mL IVPB (0 mEq Intravenous Stopped 03/10/22 1657)  potassium chloride (KLOR-CON) packet 40 mEq (40 mEq Oral Given 03/10/22 1420)  sodium chloride 0.9 % bolus 500 mL (0 mLs Intravenous Stopped 03/10/22 1519)  potassium chloride 10 mEq in 100 mL IVPB (0 mEq Intravenous Stopped 03/10/22 1657)     IMPRESSION / MDM / ASSESSMENT AND PLAN / ED COURSE  I reviewed the triage vital signs and the nursing notes.   Differential diagnosis includes, but is not limited to, fracture, contusion, skin tear, intracranial hemorrhage, dementia, UTI, COVID-19, TIA/CVA.  Patient is awake and alert, hemodynamically stable and afebrile.  She appears to be at her mental baseline per her husband who is at the bedside.  Labs obtained given that this was an unwitnessed fall.  CT head and neck obtained per [place] criteria.  X-ray of her chest and elbow also obtained.  She is able to range her hips actively and passively without pain.  Do not suspect hip fracture.  X-ray of her chest demonstrates an age-indeterminate T12 compression fracture.  Patient has no pain in this location.  Labs reveal a critically low potassium of 2.7.  This was repleted with 10 IV, 40 p.o., and a subsequent 10 IV.  She was also hydrated gently with normal saline.  Urinalysis obtained demonstrates no signs of infection.  COVID is negative.  She is able to ambulate with minimal assist which is her  baseline according to husband.  She was reevaluated multiple times with overall improvement of her symptoms.  Troponin is mildly elevated, this was discussed with Dr. Charna Archer.  No priors to compare to, no chest pain.  EKG is normal.  CPK was elevated, though is downtrending.  Recommended strict return precautions and the importance of close outpatient follow-up.  Recommended outpatient follow-up in 1 week for recheck of potassium.  Patient and husband understand and agree with plan.  This was also written on her discharge paperwork for her care home.  Patient husband reports that she is getting adequate care at the nursing home and he is happy with his care.  Patient's husband feels comfortable with discharge home.   Patient's presentation is most consistent with acute complicated illness / injury requiring diagnostic workup.    FINAL CLINICAL IMPRESSION(S) / ED DIAGNOSES   Final diagnoses:  Fall, initial encounter  Injury of head, initial encounter  Abrasion  Hypokalemia     Rx / DC Orders   ED Discharge Orders     None        Note:  This document was prepared using Dragon voice recognition software and may include unintentional dictation errors.   Emeline Gins 03/10/22 1933    Blake Divine, MD 03/10/22 [id number]

## 2022-03-10 NOTE — ED Triage Notes (Signed)
Presents via EMS from [facility] fall   Found laying on the floor  Hit back of head  Skin tear to right elbow

## 2022-03-10 NOTE — Discharge Instructions (Signed)
Your CT scan, x-ray, and blood work are overall reassuring with the exception of low potassium.  You were given potassium through your IV and also by mouth while in the emergency department.  Please have this rechecked in 1 week.  Please return for any new, worsening, or change in symptoms or other concerns. It was a pleasure caring for you.

## 2022-03-10 NOTE — ED Notes (Signed)
Pt up and walked to the bathroom with this EDT. Tolerated well. No other needs at this time.

## 2022-03-21 ENCOUNTER — Ambulatory Visit: Payer: Medicare Other | Admitting: Podiatry

## 2022-03-21 ENCOUNTER — Encounter: Payer: Self-pay | Admitting: Podiatry

## 2022-03-21 DIAGNOSIS — M2041 Other hammer toe(s) (acquired), right foot: Secondary | ICD-10-CM

## 2022-03-21 NOTE — Progress Notes (Addendum)
Subjective:  Patient ID: Kathleen Walls, female    DOB: 12-01-1940,  MRN: [id number]  Chief Complaint  Patient presents with   Toe Pain    81 y.o. female presents with the above complaint. Patient presents with complaint of right second digit hammertoe.  Patient states painful to touch is progressive gotten worse.  She has not seen anyone else prior to seeing me.  She went to get it evaluated she is here with her caretaker/husband today.  She is mostly achy dull.  Depends on the shoes on, she has been on her foot.  Pain scale 3 out of 10 hurts with ambulation hurts with pressure.  She has not made any modification to the shoes.   Review of Systems: Negative except as noted in the HPI. Denies N/V/F/Ch.  Past Medical History:  Diagnosis Date   Arthritis    Breast cancer [facility]) 2009    2009 breast cancer stage IIA T2 N0, ER positive, HER-2 negative, 2.5 cm primary tumor, poorly differentiated, status post wide local excision, one sentinel lymph node negative, high-risk Oncotype score 48   Cataracts, both eyes    Colitis    Complication of anesthesia    confusion   Coronary artery disease    Detached vitreous humor    Diverticulitis    Female bladder prolapse    Gastritis    GERD (gastroesophageal reflux disease)    Heart valve problem 06/19/2013   Leaking   Hemorrhoid    Hemorrhoids    History of kidney stones    Hyperlipemia    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 , Disp: , Rfl:    Azelaic Acid 15 % cream, Apply 1  application topically daily.  (Patient not taking: Reported on 11/21/2021), Disp: , Rfl:    butalbital-acetaminophen-caffeine (FIORICET, ESGIC) 50-325-40 MG tablet, Take 1-2 tablets by mouth daily as needed for headache., Disp: , Rfl:    docusate sodium (COLACE) 100 MG capsule, Take 100 mg by mouth daily as needed for mild constipation. , Disp: , Rfl:    ezetimibe (ZETIA) 10 MG tablet, Take 10 mg by mouth daily. , Disp: , Rfl:    hydrocortisone 2.5 % cream, Apply topically. (Patient not taking: Reported on 11/21/2021), Disp: , Rfl:    hydroxychloroquine (PLAQUENIL) 200 MG tablet, Take 1 tablet by mouth once daily, Disp: , Rfl:    melatonin 3 MG TABS tablet, Take by mouth., Disp: , Rfl:    memantine (NAMENDA) 5 MG tablet, Take 5 mg by mouth daily., Disp: , Rfl:    metoprolol succinate (TOPROL-XL) 25 MG 24 hr tablet, Take 25 mg by mouth daily. , Disp: , Rfl:    nitroGLYCERIN (NITROSTAT) 0.4 MG SL tablet, Place 0.4 mg under the tongue every 5 (five) minutes as needed for chest pain., Disp: , Rfl:    OLANZapine (ZYPREXA) 2.5 MG tablet, Take 1  tablet (2.5 mg total) by mouth at bedtime., Disp: 30 tablet, Rfl: 2   omeprazole (PRILOSEC) 20 MG capsule, Take 20 mg by mouth 2 (two) times daily before a meal., Disp: , Rfl:    sodium chloride (MURO 128) 5 % ophthalmic solution, Place 1 drop into the left eye at bedtime., Disp: , Rfl:    venlafaxine XR (EFFEXOR-XR) 37.5 MG 24 hr capsule, Take 37.5 mg by mouth daily., Disp: , Rfl:   Social History   Tobacco Use  Smoking Status Former   Types: Cigarettes   Quit date: 08/14/1993   Years since quitting: 28.6  Smokeless Tobacco Never    Allergies  Allergen Reactions   Alendronate Sodium Other (See Comments)    Unknown   Atorvastatin Other (See Comments)    Muscle Pain   Azithromycin Hives   Ciprofloxacin Other (See Comments)    Headache   Colestipol Other (See Comments)    Unknown   Lidocaine Hives   Lovastatin Other (See Comments)    Headache    Metronidazole Other (See Comments)    Headache   Niacin Other (See Comments)    Unknown   Risedronate Sodium Other (See Comments)    Unknown   Rosuvastatin Other (See Comments)    Unknown   Simvastatin Other (See Comments)    Headache   Objective:  There were no vitals filed for this visit. There is no height or weight on file to calculate BMI. Constitutional Well developed. Well nourished.  Vascular Dorsalis pedis pulses palpable bilaterally. Posterior tibial pulses palpable bilaterally. Capillary refill normal to all digits.  No cyanosis or clubbing noted. Pedal hair growth normal.  Neurologic Normal speech. Oriented to person, place, and time. Epicritic sensation to light touch grossly present bilaterally.  Dermatologic Hyperkeratotic lesion noted to the right second digit hammertoe contracture semirigid in nature.  Minimal flexibility noted to the second toe.  Pain on palpation mildly.  Orthopedic: Normal joint ROM without pain or crepitus bilaterally. No visible deformities. No bony tenderness.   Radiographs: None Assessment:   1. Flexor hallucis longus tendinitis    Plan:  Patient was evaluated and treated and all questions answered.  Right second digit hammertoe semirigid -All questions and concerns were discussed with the patient in extensive detail given the amount of pain that she is having I believe she will benefit from aggressive debridement of the lesion.  Patient is overly handle the lesion was debrided down to healthy scar tissue no complication no pinpoint bleeding noted I extensively discussed shoe gear modification and I gave her toe protector to take the pressure off or she states understanding will do so.  If there is no improvement we will discuss surgical options  No follow-ups on file.

## 2022-03-25 ENCOUNTER — Emergency Department: Payer: Medicare Other

## 2022-03-25 ENCOUNTER — Emergency Department
Admission: EM | Admit: 2022-03-25 | Discharge: 2022-03-25 | Disposition: A | Discharge status: Skilled Nursing Facility | Payer: Medicare Other | Attending: Emergency Medicine | Admitting: Emergency Medicine

## 2022-03-25 ENCOUNTER — Other Ambulatory Visit: Payer: Self-pay

## 2022-03-25 ENCOUNTER — Encounter: Payer: Self-pay | Admitting: Intensive Care

## 2022-03-25 DIAGNOSIS — F0390 Unspecified dementia without behavioral disturbance: Secondary | ICD-10-CM | POA: Insufficient documentation

## 2022-03-25 DIAGNOSIS — W19XXXA Unspecified fall, initial encounter: Secondary | ICD-10-CM | POA: Diagnosis not present

## 2022-03-25 DIAGNOSIS — Z853 Personal history of malignant neoplasm of breast: Secondary | ICD-10-CM | POA: Diagnosis not present

## 2022-03-25 DIAGNOSIS — I10 Essential (primary) hypertension: Secondary | ICD-10-CM | POA: Insufficient documentation

## 2022-03-25 DIAGNOSIS — R519 Headache, unspecified: Secondary | ICD-10-CM | POA: Insufficient documentation

## 2022-03-25 DIAGNOSIS — I2510 Atherosclerotic heart disease of native coronary artery without angina pectoris: Secondary | ICD-10-CM | POA: Diagnosis not present

## 2022-03-25 LAB — COMPREHENSIVE METABOLIC PANEL
ALT: 16 U/L (ref 0–44)
AST: 25 U/L (ref 15–41)
Albumin: 3.2 g/dL — ABNORMAL LOW (ref 3.5–5.0)
Alkaline Phosphatase: 94 U/L (ref 38–126)
Anion gap: 7 (ref 5–15)
BUN: 18 mg/dL (ref 8–23)
CO2: 28 mmol/L (ref 22–32)
Calcium: 9 mg/dL (ref 8.9–10.3)
Chloride: 106 mmol/L (ref 98–111)
Creatinine, Ser: 0.94 mg/dL (ref 0.44–1.00)
GFR, Estimated: >60 mL/min (ref >60)
Glucose, Bld: 85 mg/dL (ref 70–99)
Potassium: 3.8 mmol/L (ref 3.5–5.1)
Sodium: 141 mmol/L (ref 135–145)
Total Bilirubin: 0.7 mg/dL (ref 0.3–1.2)
Total Protein: 6.1 g/dL — ABNORMAL LOW (ref 6.5–8.1)

## 2022-03-25 LAB — CBC WITH DIFFERENTIAL/PLATELET
Abs Immature Granulocytes: 0.03 10*3/uL (ref 0.00–0.07)
Basophils Absolute: 0.1 10*3/uL (ref 0.0–0.1)
Basophils Relative: 1 %
Eosinophils Absolute: 0.3 10*3/uL (ref 0.0–0.5)
Eosinophils Relative: 6 %
HCT: 35.7 % — ABNORMAL LOW (ref 36.0–46.0)
Hemoglobin: 11 g/dL — ABNORMAL LOW (ref 12.0–15.0)
Immature Granulocytes: 1 %
Lymphocytes Relative: 18 %
Lymphs Abs: 1 10*3/uL (ref 0.7–4.0)
MCH: 25.9 pg — ABNORMAL LOW (ref 26.0–34.0)
MCHC: 30.8 g/dL (ref 30.0–36.0)
MCV: 84.2 fL (ref 80.0–100.0)
Monocytes Absolute: 0.9 10*3/uL (ref 0.1–1.0)
Monocytes Relative: 18 %
Neutro Abs: 2.9 10*3/uL (ref 1.7–7.7)
Neutrophils Relative %: 56 %
Platelets: 324 10*3/uL (ref 150–400)
RBC: 4.24 MIL/uL (ref 3.87–5.11)
RDW: 16.6 % — ABNORMAL HIGH (ref 11.5–15.5)
WBC: 5.2 10*3/uL (ref 4.0–10.5)
nRBC: 0 % (ref 0.0–0.2)

## 2022-03-25 LAB — URINALYSIS, ROUTINE W REFLEX MICROSCOPIC
Bilirubin Urine: NEGATIVE
Glucose, UA: NEGATIVE mg/dL
Hgb urine dipstick: NEGATIVE
Ketones, ur: NEGATIVE mg/dL
Leukocytes,Ua: NEGATIVE
Nitrite: NEGATIVE
Protein, ur: NEGATIVE mg/dL
Specific Gravity, Urine: 1.01 (ref 1.005–1.030)
pH: 6 (ref 5.0–8.0)

## 2022-03-25 MED ORDER — IBUPROFEN 400 MG PO TABS
400.0000 mg | ORAL_TABLET | Freq: Once | ORAL | Status: AC
  Administered 2022-03-25: 400 mg via ORAL
  Filled 2022-03-25: qty 1

## 2022-03-25 MED ORDER — ACETAMINOPHEN 500 MG PO TABS
1000.0000 mg | ORAL_TABLET | Freq: Once | ORAL | Status: AC
  Administered 2022-03-25: 1000 mg via ORAL

## 2022-03-25 NOTE — ED Triage Notes (Signed)
Patient brought in by EMS from [place] for unwitnessed fall. Patient is oriented to self only. C/o head hurting. Cannot answer simple questions. Hx dementia and alzheimer's

## 2022-03-25 NOTE — ED Triage Notes (Signed)
Pt in via EMS from the [facility] with c/o fall. Pt was found on the floor. Pt reports remembers falling and has a large knot on back of her head. Pt also has dementia and alzheimer's. Pt also c/o pain to bottom when moved from bed to wheelchair.

## 2022-03-25 NOTE — ED Provider Notes (Signed)
[facility] Provider Note    Event Date/Time   First MD Initiated Contact with Patient 03/25/22 [id number]     (approximate)   History   Fall   HPI  Kathleen Walls is a 81 y.o. female   past medical history advanced dementia, TIA/CVA, hyperlipidemia, crest syndrome presents after a unwitnessed fall.  Per triage note and EMS patient brought in from [place] after an unwitnessed fall.  Patient does not remember falling.  She really cannot provide much history at all but does complain of pain to the top of her head.  Denies chest pain abdominal pain nausea vomiting or urinary symptoms denies fevers chills or shortness of breath.  Denies back pain.  Do not see any anticoagulation listed on her medication list.     Past Medical History:  Diagnosis Date   Arthritis    Breast cancer ([place]) 2009    2009 breast cancer stage IIA T2 N0, ER positive, HER-2 negative, 2.5 cm primary tumor, poorly differentiated, status post wide local excision, one sentinel lymph node negative, high-risk Oncotype score 48   Cataracts, both eyes    Colitis    Complication of anesthesia    confusion   Coronary artery disease    Detached vitreous humor    Diverticulitis    Female bladder prolapse    Gastritis    GERD (gastroesophageal reflux disease)    Heart valve problem 06/19/2013   Leaking   Hemorrhoid    Hemorrhoids    History of kidney stones    Hyperlipemia    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, STAGE 2   Kidney cysts    Left   Kidney stones    Lipoma of shoulder 2009   left   Migraine    Osteoarthritis    Osteopenia    Stented coronary artery    Stroke [facility])     Patient Active Problem List   Diagnosis Date Noted   Dementia ([place]) 02/02/2022   Statin intolerance 12/03/2019   Bilateral carotid artery stenosis 07/29/2019   History of CVA (cerebrovascular accident) 07/29/2019   Pure hypercholesterolemia 04/04/2019    Calcinosis 03/27/2019   CREST (calcinosis, Raynaud's phenomenon, esophageal dysfunction, sclerodactyly, telangiectasia) ([place]) 03/27/2019   Encounter for long-term (current) use of high-risk medication 03/27/2019   Constipation, chronic 06/28/2018   Primary osteoarthritis of both hands 05/09/2018   Bilateral hand pain 01/25/2018   Raynaud's phenomenon without gangrene 01/25/2018   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   Carotid stenosis 12/26/2017   TIA (transient ischemic attack) 11/16/2017   Acute cerebrovascular accident (CVA) ([place]) 11/16/2017   Carcinoma of overlapping sites of left breast in female, estrogen receptor positive ([place]) 11/17/2016   Symptomatic carotid artery stenosis without infarction 09/11/2016   H/O appendicitis 08/01/2016   Thyromegaly 12/22/2015   Chest pain 05/14/2015   Arteriosclerosis of coronary artery 07/22/2014   Disorder of mitral valve 01/17/2014   Absolute anemia 10/23/2013   Benign hypertension 10/23/2013   Dermatophytic onychia 10/23/2013   HLD (hyperlipidemia) 10/23/2013   Idiopathic peripheral neuropathy 10/23/2013     Physical Exam  Triage Vital Signs: ED Triage Vitals  Enc Vitals Group     BP 03/25/22 0725 (!) 145/70     Pulse Rate 03/25/22 0725 89     Resp 03/25/22 0725 16     Temp 03/25/22 0725 98.1 F (36.7 C)     Temp Source 03/25/22 0725 Oral  SpO2 03/25/22 0725 96 %     Weight 03/25/22 0731 90 lb (40.8 kg)     Height 03/25/22 0731 $RemoveBefor'5\' 2"'sAVFCodBbhtk$  (1.575 m)     Head Circumference --      Peak Flow --      Pain Score --      Pain Loc --      Pain Edu? --      Excl. in [place]? --     Most recent vital signs: Vitals:   03/25/22 0725 03/25/22 0916  BP: (!) 145/70 (!) 141/70  Pulse: 89 85  Resp: 16 16  Temp: 98.1 F (36.7 C) 98 F (36.7 C)  SpO2: 96% 97%     General: Awake, no distress.  Frail-appearing CV:  Good peripheral perfusion.  No peripheral edema Resp:  Normal effort.  No increased work of breathing Abd:  No distention.  Abdomen is  soft and nontender Neuro:            Patient is alert oriented to person only moves all extremities spontaneously pupils equal round reactive to light Other:  No obvious signs of trauma to the head or neck, tenderness to palpation over the anterior scalp, positive C-spine tenderness Pelvis is stable nontender able to range bilateral hips without difficulty no focal tenderness or deformity of bilateral upper or lower extremities   ED Results / Procedures / Treatments  Labs (all labs ordered are listed, but only abnormal results are displayed) Labs Reviewed  CBC WITH DIFFERENTIAL/PLATELET - Abnormal; Notable for the following components:      Result Value   Hemoglobin 11.0 (*)    HCT 35.7 (*)    MCH 25.9 (*)    RDW 16.6 (*)    All other components within normal limits  COMPREHENSIVE METABOLIC PANEL - Abnormal; Notable for the following components:   Total Protein 6.1 (*)    Albumin 3.2 (*)    All other components within normal limits  URINALYSIS, ROUTINE W REFLEX MICROSCOPIC - Abnormal; Notable for the following components:   Color, Urine YELLOW (*)    APPearance CLEAR (*)    All other components within normal limits     EKG  EKG interpretation performed by myself: NSR, nml axis, nml intervals, no acute ischemic changes    RADIOLOGY I reviewed and interpreted the CT scan of the brain which does not show any acute intracranial process    PROCEDURES:  Critical Care performed: No  .1-3 Lead EKG Interpretation  Performed by: Rada Hay, MD Authorized by: Rada Hay, MD     Interpretation: normal     ECG rate assessment: normal     Rhythm: sinus rhythm     Ectopy: none     Conduction: normal     The patient is on the cardiac monitor to evaluate for evidence of arrhythmia and/or significant heart rate changes.   MEDICATIONS ORDERED IN ED: Medications  acetaminophen (TYLENOL) tablet 1,000 mg (1,000 mg Oral Given 03/25/22 3976)     IMPRESSION / MDM  / ASSESSMENT AND PLAN / ED COURSE  I reviewed the triage vital signs and the nursing notes.                              Patient's presentation is most consistent with acute presentation with potential threat to life or bodily function.  Differential diagnosis includes, but is not limited to, mechanical fall, concussion, intracranial hemorrhage, cervical spine  fracture  Patient is a 81 year old female with underlying dementia presents after an unwitnessed fall at her nursing facility.  Patient unable to provide history.  Does complain of pain to the top of her head.  She is oriented to person only but neurologic exam is otherwise nonfocal.  She has no signs of trauma to the head but she is tender to palpation over the top of the head and on her neck chest wall abdomen pelvis and extremities nontender.  Labs and a UA were obtained from triage.  Will obtain CT head and neck to rule out traumatic brain or cervical spine injury.    Labs, including CBC and CMP are reassuring. No AKI, leukocytosis or electrolyte abnormality. UA does not suggest infection. CTH and neck do not show any acute injury. Patient at her baseline with appropriate VS and work-up. She is appropriate for discharge back to her facility.    FINAL CLINICAL IMPRESSION(S) / ED DIAGNOSES   Final diagnoses:  Fall, initial encounter     Rx / DC Orders   ED Discharge Orders     None        Note:  This document was prepared using Dragon voice recognition software and may include unintentional dictation errors.   Rada Hay, MD 03/25/22 [phone]

## 2022-03-25 NOTE — Discharge Instructions (Addendum)
Your CAT scans of your head and neck did not show any acute injuries.  Your blood work and urine sample were all reassuring.  Please wait for assistance prior to walking on your own.  Continue to work with physical therapy.

## 2022-04-23 ENCOUNTER — Emergency Department
Admission: EM | Admit: 2022-04-23 | Discharge: 2022-04-23 | Disposition: A | Discharge status: Home / Self Care | Payer: Medicare Other | Attending: Emergency Medicine | Admitting: Emergency Medicine

## 2022-04-23 ENCOUNTER — Emergency Department: Payer: Medicare Other

## 2022-04-23 DIAGNOSIS — F0390 Unspecified dementia without behavioral disturbance: Secondary | ICD-10-CM | POA: Diagnosis not present

## 2022-04-23 DIAGNOSIS — W19XXXA Unspecified fall, initial encounter: Secondary | ICD-10-CM | POA: Diagnosis not present

## 2022-04-23 LAB — URINALYSIS, ROUTINE W REFLEX MICROSCOPIC
Bilirubin Urine: NEGATIVE
Glucose, UA: NEGATIVE mg/dL
Hgb urine dipstick: NEGATIVE
Ketones, ur: NEGATIVE mg/dL
Nitrite: NEGATIVE
Protein, ur: NEGATIVE mg/dL
Specific Gravity, Urine: 1.017 (ref 1.005–1.030)
pH: 5 (ref 5.0–8.0)

## 2022-04-23 NOTE — ED Triage Notes (Signed)
Patient is a resident from [facility] at [place]; Sent here for an unwitnessed fall; She does have dementia and is pleasantly confused upon arrival with no complaints (this is her baseline per EMS who verified this with staff at her SNF); No obvious signs of injury observed, she is not on any anticoagulants except for a daily 81 mg ASA

## 2022-04-23 NOTE — ED Provider Notes (Signed)
[facility] Provider Note    Event Date/Time   First MD Initiated Contact with Patient 04/23/22 Kathleen Walls     (approximate)   History   Fall (Patient is a resident from [facility] at [place]; Sent here for an unwitnessed fall; She does have dementia and is pleasantly confused upon arrival with no complaints (this is her baseline per EMS who verified this with staff at her SNF); No obvious signs of injury observed, she is not on any anticoagulants except for a daily 81 mg ASA)   HPI  Kathleen Walls is a 81 y.o. female as noted above patient with baseline dementia pleasantly confused had an unwitnessed fall.  She is here with her husband.  There is no sign of any injury and she feels fine.      Physical Exam   Triage Vital Signs: ED Triage Vitals  Enc Vitals Group     BP 04/23/22 1808 (!) 98/55     Pulse Rate 04/23/22 1808 74     Resp 04/23/22 1808 17     Temp 04/23/22 1808 99 F (37.2 C)     Temp Source 04/23/22 1808 Oral     SpO2 04/23/22 1808 96 %     Weight 04/23/22 1816 84 lb 3.5 oz (38.2 kg)     Height 04/23/22 1816 '5\' 2"'$  (1.575 m)     Head Circumference --      Peak Flow --      Pain Score --      Pain Loc --      Pain Edu? --      Excl. in [place]? --     Most recent vital signs: Vitals:   04/23/22 1900 04/23/22 1915  BP: 96/67 (!) 103/55  Pulse: 74 74  Resp:    Temp:    SpO2: 97% 96%     General: Awake, no distress.  Head normocephalic atraumatic Eyes pupils equal round reactive extraocular movements intact Neck is supple and nontender CV:  Good peripheral perfusion.  Heart regular rate and rhythm no audible murmurs Resp:  Normal effort.  Lungs are clear Chest is nontender Abd:  No distention.  Nontender Extremities good range of motion and nontender   ED Results / Procedures / Treatments   Labs (all labs ordered are listed, but only abnormal results are displayed) Labs Reviewed  URINALYSIS, ROUTINE W REFLEX MICROSCOPIC -  Abnormal; Notable for the following components:      Result Value   Color, Urine YELLOW (*)    APPearance HAZY (*)    Leukocytes,Ua MODERATE (*)    Bacteria, UA RARE (*)    All other components within normal limits  COMPREHENSIVE METABOLIC PANEL  LACTIC ACID, PLASMA  LACTIC ACID, PLASMA  CBC WITH DIFFERENTIAL/PLATELET     EKG     RADIOLOGY CT read by radiology reviewed and interpreted by me shows no acute intracranial process or cervical fractures.   PROCEDURES:  Critical Care performed:   Procedures   MEDICATIONS ORDERED IN ED: Medications - No data to display   IMPRESSION / MDM / [place] / ED COURSE  I reviewed the triage vital signs and the nursing notes. Patient's husband reports her blood pressure is usually in the high 90s low 100s.  When he gets up [facility] when she is anxious in the office.  Differential diagnosis includes, but is not limited to, fall mechanical most likely  Patient's presentation is most consistent with acute complicated illness /  injury requiring diagnostic workup. The patient is on the cardiac monitor to evaluate for evidence of arrhythmia and/or significant heart rate changes.  None have been seen      FINAL CLINICAL IMPRESSION(S) / ED DIAGNOSES   Final diagnoses:  Fall, initial encounter     Rx / DC Orders   ED Discharge Orders     None        Note:  This document was prepared using Dragon voice recognition software and may include unintentional dictation errors.   [name], MD 04/23/22 [phone]

## 2022-04-23 NOTE — Discharge Instructions (Signed)
Please return as needed.  CTs were both negative.

## 2022-04-23 NOTE — ED Notes (Signed)
Dustin at [employer] was given hand off.

## 2022-04-26 NOTE — Progress Notes (Signed)
MRN : [id number]  Kathleen Walls is a 81 y.o. ([birth year]) female who presents with chief complaint of check carotid arteries.  History of Present Illness:   The patient is seen for follow up evaluation of carotid stenosis status post right carotid endarterectomy on 12/26/2017.  There were no post operative problems or complications related to the surgery.  The patient denies neck or incisional pain.   The patient denies interval amaurosis fugax. There is no recent history of TIA symptoms or focal motor deficits. There is no prior documented CVA.   The son reports that there has been a significant changes in her overall health care over the past year.  She has been diagnosed with dementia which is advanced substantially and she is now become a resident of [facility] for memory care.  He also notes that she has essentially stopped eating and has lost a significant amount of weight.   The patient has a history of coronary artery disease, no recent episodes of angina or shortness of breath. The patient denies PAD or claudication symptoms. There is a history of hyperlipidemia which is being treated with a statin.    Duplex ultrasound of the carotid arteries shows 1-39% RICA stenosis and 40 to 88% LICA stenosis.  Comparison of velocities to previous study shows that there is no significant change.  This study is stable when compared to the previous several exams.    No outpatient medications have been marked as taking for the 04/27/22 encounter (Appointment) with Delana Meyer, Dolores Lory, MD.    Past Medical History:  Diagnosis Date   Arthritis    Breast cancer [facility]) 2009    2009 breast cancer stage IIA T2 N0, ER positive, HER-2 negative, 2.5 cm primary tumor, poorly differentiated, status post wide local excision, one sentinel lymph node negative, high-risk Oncotype score 48   Cataracts, both eyes    Colitis    Complication of anesthesia    confusion   Coronary artery disease     Detached vitreous humor    Diverticulitis    Female bladder prolapse    Gastritis    GERD (gastroesophageal reflux disease)    Heart valve problem 06/19/2013   Leaking   Hemorrhoid    Hemorrhoids    History of kidney stones    Hyperlipemia    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, STAGE 2   Kidney cysts    Left   Kidney stones    Lipoma of shoulder 2009   left   Migraine    Osteoarthritis    Osteopenia    Stented coronary artery    Stroke [facility])     Past Surgical History:  Procedure Laterality Date   ABDOMINAL HYSTERECTOMY     benign breast biopsy  2007   BREAST BIOPSY Left 2007   positive   BREAST CYST ASPIRATION Right    neg   BREAST LUMPECTOMY Left 2009    2009 breast cancer stage IIA T2 N0, ER positive, HER-2 negative, 2.5 cm primary tumor, poorly differentiated, status post wide local excision, one sentinel lymph node negative, high-risk Oncotype score 48   BREAST SURGERY     CARDIAC CATHETERIZATION N/A 06/22/2015   Procedure: Left Heart Cath and Coronary Angiography;  Surgeon: Teodoro Spray, MD;  Location: [place] CV LAB;  Service: Cardiovascular;  Laterality: N/A;   CARDIAC CATHETERIZATION     CATARACT EXTRACTION  COLONOSCOPY     CORONARY ANGIOPLASTY     ENDARTERECTOMY Right 12/26/2017   Procedure: ENDARTERECTOMY CAROTID;  Surgeon: Katha Cabal, MD;  Location: [facility] ORS;  Service: Vascular;  Laterality: Right;   EYE SURGERY     FOOT FRACTURE SURGERY Left    PARTIAL HYSTERECTOMY     UPPER GASTROINTESTINAL ENDOSCOPY      Social History Social History   Tobacco Use   Smoking status: Former    Types: Cigarettes    Quit date: 08/14/1993    Years since quitting: 28.7   Smokeless tobacco: Never  Vaping Use   Vaping Use: Never used  Substance Use Topics   Alcohol use: Not Currently    Alcohol/week: 0.0 standard drinks of alcohol    Comment: rare   Drug use: No    Family History Family History  Problem  Relation Age of Onset   Heart disease Mother    Diabetes Father    Heart disease Father    Diabetes Sister    Diabetes Brother    Cervical cancer Cousin    Breast cancer Cousin    Lung cancer Paternal Aunt    Prostate cancer Maternal Uncle    Leukemia Other    Hypertension Other     Allergies  Allergen Reactions   Alendronate Sodium Other (See Comments)    Unknown   Atorvastatin Other (See Comments)    Muscle Pain   Azithromycin Hives   Ciprofloxacin Other (See Comments)    Headache   Colestipol Other (See Comments)    Unknown   Lidocaine Hives   Lovastatin Other (See Comments)    Headache   Metronidazole Other (See Comments)    Headache   Niacin Other (See Comments)    Unknown   Risedronate Sodium Other (See Comments)    Unknown   Rosuvastatin Other (See Comments)    Unknown   Simvastatin Other (See Comments)    Headache     REVIEW OF SYSTEMS (Negative unless checked)  Constitutional: _0 Weight loss  _1 Fever  _2 Chills Cardiac: _3 Chest pain   _4 Chest pressure   _5 Palpitations   _6 Shortness of breath when laying flat   _7 Shortness of breath with exertion. Vascular:  _8 Pain in legs with walking   _9 Pain in legs at rest  _10 History of DVT   _11 Phlebitis   _12 Swelling in legs   _13 Varicose veins   _14 Non-healing ulcers Pulmonary:   _15 Uses home oxygen   _16 Productive cough   _17 Hemoptysis   _18 Wheeze  _19 COPD   _20 Asthma Neurologic:  _21 Dizziness   _22 Seizures   _23 History of stroke   _24 History of TIA  _25 Aphasia   _26 Vissual changes   _27 Weakness or numbness in arm   _28 Weakness or numbness in leg Musculoskeletal:   _29 Joint swelling   _30 Joint pain   _31 Low back pain Hematologic:  _32 Easy bruising  _33 Easy bleeding   _34 Hypercoagulable state   _35 Anemic Gastrointestinal:  _36 Diarrhea   _37 Vomiting  _38 Gastroesophageal reflux/heartburn   _39 Difficulty swallowing. Genitourinary:  _40 Chronic kidney disease   _41 Difficult urination  _42 Frequent urination   _43 Blood in urine Skin:  _44 Rashes   _45 Ulcers   Psychological:  _46 History of anxiety   _47  History of major depression.  Physical Examination  There were no vitals filed for this visit. There is no height or weight on file to calculate BMI. Gen: WD/WN, NAD Head: [place]/AT, No temporalis wasting.  Ear/Nose/Throat: Hearing grossly intact, nares w/o erythema or drainage Eyes: PER, EOMI, sclera nonicteric.  Neck: Supple, no masses.  No bruit or  JVD.  Pulmonary:  Good air movement, no audible wheezing, no use of accessory muscles.  Cardiac: RRR, normal S1, S2, no Murmurs. Vascular:  carotid bruit noted left well-healed CEA incisional scar right Vessel Right Left  Radial Palpable Palpable  Carotid  Palpable  Palpable  Gastrointestinal: soft, non-distended. No guarding/no peritoneal signs.  Musculoskeletal: M/S 5/5 throughout.  No visible deformity.  Neurologic: CN 2-12 intact. Pain and light touch intact in extremities.  Symmetrical.  Speech is fluent. Motor exam as listed above. Psychiatric: Judgment intact, Mood & affect appropriate for pt's clinical situation. Dermatologic: No rashes or ulcers noted.  No changes consistent with cellulitis.   CBC Lab Results  Component Value Date   WBC 5.2 03/25/2022   HGB 11.0 (L) 03/25/2022   HCT 35.7 (L) 03/25/2022   MCV 84.2 03/25/2022   PLT 324 03/25/2022    BMET    Component Value Date/Time   NA 141 03/25/2022 0735   NA 143 12/31/2013 0406   K 3.8 03/25/2022 0735   K 4.5 12/31/2013 0406   CL 106 03/25/2022 0735   CL 111 (H) 12/31/2013 0406   CO2 28 03/25/2022 0735   CO2 28 12/31/2013 0406   GLUCOSE 85 03/25/2022 0735   GLUCOSE 115 (H) 12/31/2013 0406   BUN 18 03/25/2022 0735   BUN 10 12/31/2013 0406   CREATININE 0.94 03/25/2022 0735   CREATININE 0.87 12/31/2013 0406   CALCIUM 9.0 03/25/2022 0735   CALCIUM 8.6 12/31/2013 0406   GFRNONAA >60 03/25/2022 0735   GFRNONAA >60 12/31/2013 0406   GFRAA 59 (L) 11/20/2019 1011   GFRAA >60 12/31/2013 0406   CrCl cannot be calculated  (Patient's most recent lab result is older than the maximum 21 days allowed.).  COAG Lab Results  Component Value Date   INR 0.99 12/14/2017   INR 0.90 11/16/2017    Radiology CT Head Wo Contrast  Result Date: 04/23/2022 CLINICAL DATA:  Head trauma from fall EXAM: CT HEAD WITHOUT CONTRAST TECHNIQUE: Contiguous axial images were obtained from the base of the skull through the vertex without intravenous contrast. RADIATION DOSE REDUCTION: This exam was performed according to the departmental dose-optimization program which includes automated exposure control, adjustment of the mA and/or kV according to patient size and/or use of iterative reconstruction technique. COMPARISON:  CT head 03/25/2022 FINDINGS: Brain: No intracranial hemorrhage, mass effect, or evidence of acute infarct. No hydrocephalus. No extra-axial fluid collection. Generalized cerebral atrophy. Ill-defined hypoattenuation within the cerebral white matter is nonspecific but consistent with chronic small vessel ischemic disease. Chronic right parietal infarct. Vascular: No hyperdense vessel. Intracranial arterial calcification. Skull: No fracture or focal lesion. Sinuses/Orbits: No acute finding. Paranasal sinuses and mastoid air cells are well aerated. Other: None. IMPRESSION: No acute intracranial abnormality. Electronically Signed   By: Placido Sou M.D.   On: 04/23/2022 18:58   CT Cervical Spine Wo Contrast  Result Date: 04/23/2022 CLINICAL DATA:  Trauma. EXAM: CT CERVICAL SPINE WITHOUT CONTRAST TECHNIQUE: Multidetector CT imaging of the cervical spine was performed without intravenous contrast. Multiplanar CT image reconstructions were also generated. RADIATION DOSE REDUCTION: This exam was performed according to the departmental dose-optimization program which includes automated exposure control, adjustment of the mA and/or kV according to patient size and/or use of iterative reconstruction technique. COMPARISON:  Cervical spine  CT 03/25/2022 FINDINGS: Alignment: Normal. Skull base and vertebrae: No acute fracture. No primary bone lesion or focal pathologic process. The bones are diffusely osteopenic. Soft tissues and spinal canal: No prevertebral fluid  or swelling. No visible canal hematoma. Disc levels: There is mild disc space narrowing and endplate osteophyte formation throughout the cervical spine. There is no severe central canal or neural foraminal stenosis at any level. Upper chest: Negative. Other: None. IMPRESSION: No acute fracture or traumatic subluxation of the cervical spine. Electronically Signed   By: Ronney Asters M.D.   On: 04/23/2022 18:55     Assessment/Plan 1. Bilateral carotid artery stenosis Given the extent of the patient's dementia in association with the minimal carotid stenosis serial surveillance is not indicated.  This was discussed with the son.  We are all in agreement.  Patient will follow-up on an as-needed basis.  2. Benign hypertension Continue antihypertensive medications as already ordered, these medications have been reviewed and there are no changes at this time.   3. Mixed hyperlipidemia Continue statin as ordered and reviewed, no changes at this time   4. Raynaud's phenomenon without gangrene The patient's Raynaud's is better and her symptoms are stable.   Behavioral modification was stressed; avoidance of cold and utilizing wool socks was reviewed again.   The reported BP today was above the AHA goal and therefore there will continue Norvasc.   Norvasc 2.5 mg po daily is prescribed but not needed currently   The patient will follow up if new problems arise.     Hortencia Pilar, MD  04/26/2022 8:22 PM

## 2022-04-27 ENCOUNTER — Encounter (INDEPENDENT_AMBULATORY_CARE_PROVIDER_SITE_OTHER): Payer: Self-pay | Admitting: Vascular Surgery

## 2022-04-27 ENCOUNTER — Ambulatory Visit (INDEPENDENT_AMBULATORY_CARE_PROVIDER_SITE_OTHER): Payer: Medicare Other | Admitting: Vascular Surgery

## 2022-04-27 ENCOUNTER — Ambulatory Visit (INDEPENDENT_AMBULATORY_CARE_PROVIDER_SITE_OTHER): Payer: Medicare Other

## 2022-04-27 VITALS — BP 128/78 | HR 79 | Ht 63.0 in | Wt 79.0 lb

## 2022-04-27 DIAGNOSIS — I6523 Occlusion and stenosis of bilateral carotid arteries: Secondary | ICD-10-CM

## 2022-04-27 DIAGNOSIS — I7300 Raynaud's syndrome without gangrene: Secondary | ICD-10-CM

## 2022-04-27 DIAGNOSIS — I10 Essential (primary) hypertension: Secondary | ICD-10-CM | POA: Diagnosis not present

## 2022-04-27 DIAGNOSIS — E782 Mixed hyperlipidemia: Secondary | ICD-10-CM

## 2022-04-28 ENCOUNTER — Encounter (INDEPENDENT_AMBULATORY_CARE_PROVIDER_SITE_OTHER): Payer: Self-pay | Admitting: Vascular Surgery

## 2022-06-12 ENCOUNTER — Encounter (INDEPENDENT_AMBULATORY_CARE_PROVIDER_SITE_OTHER): Payer: Self-pay

## 2022-09-10 ENCOUNTER — Emergency Department: Payer: 59

## 2022-09-10 ENCOUNTER — Other Ambulatory Visit: Payer: Self-pay

## 2022-09-10 ENCOUNTER — Inpatient Hospital Stay
Admission: EM | Admit: 2022-09-10 | Discharge: 2022-09-15 | DRG: 480 | Disposition: A | Discharge status: Skilled Nursing Facility | Payer: 59 | Source: Skilled Nursing Facility | Attending: Internal Medicine | Admitting: Internal Medicine

## 2022-09-10 DIAGNOSIS — Z881 Allergy status to other antibiotic agents status: Secondary | ICD-10-CM

## 2022-09-10 DIAGNOSIS — Z87891 Personal history of nicotine dependence: Secondary | ICD-10-CM

## 2022-09-10 DIAGNOSIS — Z8049 Family history of malignant neoplasm of other genital organs: Secondary | ICD-10-CM

## 2022-09-10 DIAGNOSIS — Z884 Allergy status to anesthetic agent status: Secondary | ICD-10-CM

## 2022-09-10 DIAGNOSIS — D72828 Other elevated white blood cell count: Secondary | ICD-10-CM | POA: Diagnosis present

## 2022-09-10 DIAGNOSIS — W1830XA Fall on same level, unspecified, initial encounter: Secondary | ICD-10-CM | POA: Diagnosis present

## 2022-09-10 DIAGNOSIS — Z833 Family history of diabetes mellitus: Secondary | ICD-10-CM

## 2022-09-10 DIAGNOSIS — Z7189 Other specified counseling: Secondary | ICD-10-CM | POA: Diagnosis not present

## 2022-09-10 DIAGNOSIS — F02811 Dementia in other diseases classified elsewhere, unspecified severity, with agitation: Secondary | ICD-10-CM | POA: Diagnosis present

## 2022-09-10 DIAGNOSIS — Z853 Personal history of malignant neoplasm of breast: Secondary | ICD-10-CM

## 2022-09-10 DIAGNOSIS — F01511 Vascular dementia, unspecified severity, with agitation: Secondary | ICD-10-CM | POA: Diagnosis not present

## 2022-09-10 DIAGNOSIS — I10 Essential (primary) hypertension: Secondary | ICD-10-CM | POA: Diagnosis present

## 2022-09-10 DIAGNOSIS — D649 Anemia, unspecified: Secondary | ICD-10-CM | POA: Diagnosis present

## 2022-09-10 DIAGNOSIS — Z801 Family history of malignant neoplasm of trachea, bronchus and lung: Secondary | ICD-10-CM

## 2022-09-10 DIAGNOSIS — Z806 Family history of leukemia: Secondary | ICD-10-CM | POA: Diagnosis not present

## 2022-09-10 DIAGNOSIS — Z955 Presence of coronary angioplasty implant and graft: Secondary | ICD-10-CM | POA: Diagnosis not present

## 2022-09-10 DIAGNOSIS — Z8673 Personal history of transient ischemic attack (TIA), and cerebral infarction without residual deficits: Secondary | ICD-10-CM | POA: Diagnosis not present

## 2022-09-10 DIAGNOSIS — E7800 Pure hypercholesterolemia, unspecified: Secondary | ICD-10-CM | POA: Diagnosis present

## 2022-09-10 DIAGNOSIS — R52 Pain, unspecified: Secondary | ICD-10-CM | POA: Diagnosis not present

## 2022-09-10 DIAGNOSIS — E43 Unspecified severe protein-calorie malnutrition: Secondary | ICD-10-CM | POA: Diagnosis present

## 2022-09-10 DIAGNOSIS — E785 Hyperlipidemia, unspecified: Secondary | ICD-10-CM | POA: Diagnosis present

## 2022-09-10 DIAGNOSIS — R7303 Prediabetes: Secondary | ICD-10-CM | POA: Diagnosis present

## 2022-09-10 DIAGNOSIS — I2510 Atherosclerotic heart disease of native coronary artery without angina pectoris: Secondary | ICD-10-CM | POA: Diagnosis present

## 2022-09-10 DIAGNOSIS — Z8249 Family history of ischemic heart disease and other diseases of the circulatory system: Secondary | ICD-10-CM

## 2022-09-10 DIAGNOSIS — F418 Other specified anxiety disorders: Secondary | ICD-10-CM | POA: Diagnosis present

## 2022-09-10 DIAGNOSIS — R748 Abnormal levels of other serum enzymes: Secondary | ICD-10-CM | POA: Diagnosis present

## 2022-09-10 DIAGNOSIS — S72142A Displaced intertrochanteric fracture of left femur, initial encounter for closed fracture: Principal | ICD-10-CM | POA: Diagnosis present

## 2022-09-10 DIAGNOSIS — Z803 Family history of malignant neoplasm of breast: Secondary | ICD-10-CM

## 2022-09-10 DIAGNOSIS — M341 CR(E)ST syndrome: Secondary | ICD-10-CM | POA: Diagnosis present

## 2022-09-10 DIAGNOSIS — W19XXXA Unspecified fall, initial encounter: Secondary | ICD-10-CM | POA: Diagnosis not present

## 2022-09-10 DIAGNOSIS — Z170 Estrogen receptor positive status [ER+]: Secondary | ICD-10-CM

## 2022-09-10 DIAGNOSIS — Z681 Body mass index (BMI) 19 or less, adult: Secondary | ICD-10-CM

## 2022-09-10 DIAGNOSIS — F0284 Dementia in other diseases classified elsewhere, unspecified severity, with anxiety: Secondary | ICD-10-CM | POA: Diagnosis present

## 2022-09-10 DIAGNOSIS — N39498 Other specified urinary incontinence: Secondary | ICD-10-CM | POA: Diagnosis present

## 2022-09-10 DIAGNOSIS — E876 Hypokalemia: Secondary | ICD-10-CM | POA: Diagnosis not present

## 2022-09-10 DIAGNOSIS — F01518 Vascular dementia, unspecified severity, with other behavioral disturbance: Secondary | ICD-10-CM | POA: Diagnosis not present

## 2022-09-10 DIAGNOSIS — F0283 Dementia in other diseases classified elsewhere, unspecified severity, with mood disturbance: Secondary | ICD-10-CM | POA: Diagnosis present

## 2022-09-10 DIAGNOSIS — S72002A Fracture of unspecified part of neck of left femur, initial encounter for closed fracture: Secondary | ICD-10-CM | POA: Diagnosis not present

## 2022-09-10 DIAGNOSIS — Z7982 Long term (current) use of aspirin: Secondary | ICD-10-CM

## 2022-09-10 DIAGNOSIS — Y92009 Unspecified place in unspecified non-institutional (private) residence as the place of occurrence of the external cause: Secondary | ICD-10-CM | POA: Diagnosis not present

## 2022-09-10 DIAGNOSIS — D500 Iron deficiency anemia secondary to blood loss (chronic): Secondary | ICD-10-CM | POA: Diagnosis not present

## 2022-09-10 DIAGNOSIS — Z90711 Acquired absence of uterus with remaining cervical stump: Secondary | ICD-10-CM

## 2022-09-10 DIAGNOSIS — D72829 Elevated white blood cell count, unspecified: Secondary | ICD-10-CM | POA: Diagnosis present

## 2022-09-10 DIAGNOSIS — Y92122 Bedroom in nursing home as the place of occurrence of the external cause: Secondary | ICD-10-CM | POA: Diagnosis not present

## 2022-09-10 DIAGNOSIS — F0390 Unspecified dementia without behavioral disturbance: Secondary | ICD-10-CM | POA: Diagnosis present

## 2022-09-10 DIAGNOSIS — K219 Gastro-esophageal reflux disease without esophagitis: Secondary | ICD-10-CM | POA: Diagnosis present

## 2022-09-10 DIAGNOSIS — Z79899 Other long term (current) drug therapy: Secondary | ICD-10-CM

## 2022-09-10 LAB — CBC WITH DIFFERENTIAL/PLATELET
Abs Immature Granulocytes: 0.46 10*3/uL — ABNORMAL HIGH (ref 0.00–0.07)
Basophils Absolute: 0.1 10*3/uL (ref 0.0–0.1)
Basophils Relative: 0 %
Eosinophils Absolute: 0 10*3/uL (ref 0.0–0.5)
Eosinophils Relative: 0 %
HCT: 35.2 % — ABNORMAL LOW (ref 36.0–46.0)
Hemoglobin: 10.5 g/dL — ABNORMAL LOW (ref 12.0–15.0)
Immature Granulocytes: 2 %
Lymphocytes Relative: 2 %
Lymphs Abs: 0.5 10*3/uL — ABNORMAL LOW (ref 0.7–4.0)
MCH: 25 pg — ABNORMAL LOW (ref 26.0–34.0)
MCHC: 29.8 g/dL — ABNORMAL LOW (ref 30.0–36.0)
MCV: 83.8 fL (ref 80.0–100.0)
Monocytes Absolute: 1.1 10*3/uL — ABNORMAL HIGH (ref 0.1–1.0)
Monocytes Relative: 5 %
Neutro Abs: 20.6 10*3/uL — ABNORMAL HIGH (ref 1.7–7.7)
Neutrophils Relative %: 91 %
Platelets: 293 10*3/uL (ref 150–400)
RBC: 4.2 MIL/uL (ref 3.87–5.11)
RDW: 15.9 % — ABNORMAL HIGH (ref 11.5–15.5)
WBC: 22.8 10*3/uL — ABNORMAL HIGH (ref 4.0–10.5)
nRBC: 0 % (ref 0.0–0.2)

## 2022-09-10 LAB — BASIC METABOLIC PANEL
Anion gap: 5 (ref 5–15)
BUN: 20 mg/dL (ref 8–23)
CO2: 28 mmol/L (ref 22–32)
Calcium: 8.9 mg/dL (ref 8.9–10.3)
Chloride: 104 mmol/L (ref 98–111)
Creatinine, Ser: 0.75 mg/dL (ref 0.44–1.00)
GFR, Estimated: >60 mL/min (ref >60)
Glucose, Bld: 140 mg/dL — ABNORMAL HIGH (ref 70–99)
Potassium: 4.2 mmol/L (ref 3.5–5.1)
Sodium: 137 mmol/L (ref 135–145)

## 2022-09-10 LAB — PROTIME-INR
INR: 1 (ref 0.8–1.2)
Prothrombin Time: 13.5 seconds (ref 11.4–15.2)

## 2022-09-10 LAB — APTT: aPTT: 24 seconds (ref 24–36)

## 2022-09-10 LAB — HEMOGLOBIN: Hemoglobin: 9.9 g/dL — ABNORMAL LOW (ref 12.0–15.0)

## 2022-09-10 LAB — CK: Total CK: 362 U/L — ABNORMAL HIGH (ref 38–234)

## 2022-09-10 MED ORDER — PANTOPRAZOLE SODIUM 40 MG PO TBEC
40.0000 mg | DELAYED_RELEASE_TABLET | Freq: Every day | ORAL | Status: DC
  Administered 2022-09-10 – 2022-09-15 (×4): 40 mg via ORAL
  Filled 2022-09-10 (×5): qty 1

## 2022-09-10 MED ORDER — OLANZAPINE 5 MG PO TABS
2.5000 mg | ORAL_TABLET | Freq: Every day | ORAL | Status: DC
  Administered 2022-09-10 – 2022-09-14 (×4): 2.5 mg via ORAL
  Filled 2022-09-10 (×4): qty 1

## 2022-09-10 MED ORDER — MORPHINE SULFATE (PF) 2 MG/ML IV SOLN: 1.0000 mg | INTRAVENOUS | Status: DC | PRN

## 2022-09-10 MED ORDER — MORPHINE SULFATE (PF) 2 MG/ML IV SOLN
1.0000 mg | INTRAVENOUS | Status: DC | PRN
  Administered 2022-09-10 – 2022-09-11 (×2): 1 mg via SUBCUTANEOUS
  Filled 2022-09-10 (×2): qty 1

## 2022-09-10 MED ORDER — ONDANSETRON HCL 4 MG/2ML IJ SOLN: 4.0000 mg | Freq: Three times a day (TID) | INTRAMUSCULAR | Status: DC | PRN

## 2022-09-10 MED ORDER — SENNOSIDES-DOCUSATE SODIUM 8.6-50 MG PO TABS: 1.0000 | ORAL_TABLET | Freq: Every evening | ORAL | Status: DC | PRN

## 2022-09-10 MED ORDER — HALOPERIDOL 0.5 MG PO TABS: 0.5000 mg | ORAL_TABLET | Freq: Three times a day (TID) | ORAL | Status: DC | PRN

## 2022-09-10 MED ORDER — MIRTAZAPINE 15 MG PO TABS
15.0000 mg | ORAL_TABLET | Freq: Every day | ORAL | Status: DC
  Administered 2022-09-10 – 2022-09-14 (×4): 15 mg via ORAL
  Filled 2022-09-10 (×4): qty 1

## 2022-09-10 MED ORDER — HYDRALAZINE HCL 20 MG/ML IJ SOLN: 5.0000 mg | INTRAMUSCULAR | Status: DC | PRN

## 2022-09-10 MED ORDER — CHLORHEXIDINE GLUCONATE 4 % EX LIQD: Freq: Once | CUTANEOUS | Status: AC

## 2022-09-10 MED ORDER — HYDROXYCHLOROQUINE SULFATE 200 MG PO TABS
200.0000 mg | ORAL_TABLET | Freq: Every day | ORAL | Status: DC
  Administered 2022-09-10 – 2022-09-15 (×4): 200 mg via ORAL
  Filled 2022-09-10 (×5): qty 1

## 2022-09-10 MED ORDER — OXYCODONE-ACETAMINOPHEN 5-325 MG PO TABS: 1.0000 | ORAL_TABLET | ORAL | Status: DC | PRN

## 2022-09-10 MED ORDER — MEMANTINE HCL 5 MG PO TABS
5.0000 mg | ORAL_TABLET | Freq: Two times a day (BID) | ORAL | Status: DC
  Administered 2022-09-10 – 2022-09-15 (×8): 5 mg via ORAL
  Filled 2022-09-10 (×9): qty 1

## 2022-09-10 MED ORDER — ACETAMINOPHEN 325 MG PO TABS
650.0000 mg | ORAL_TABLET | Freq: Four times a day (QID) | ORAL | Status: DC | PRN
  Filled 2022-09-10: qty 2

## 2022-09-10 MED ORDER — SODIUM CHLORIDE 0.9 % IV BOLUS
500.0000 mL | Freq: Once | INTRAVENOUS | Status: AC
  Administered 2022-09-10: 500 mL via INTRAVENOUS

## 2022-09-10 MED ORDER — HALOPERIDOL LACTATE 5 MG/ML IJ SOLN
1.0000 mg | Freq: Four times a day (QID) | INTRAMUSCULAR | Status: DC | PRN
  Administered 2022-09-10 – 2022-09-13 (×8): 1 mg via INTRAVENOUS
  Filled 2022-09-10 (×8): qty 1

## 2022-09-10 MED ORDER — ASPIRIN 81 MG PO TBEC
81.0000 mg | DELAYED_RELEASE_TABLET | Freq: Every day | ORAL | Status: DC
  Administered 2022-09-10: 81 mg via ORAL
  Filled 2022-09-10: qty 1

## 2022-09-10 MED ORDER — AMLODIPINE BESYLATE 5 MG PO TABS
2.5000 mg | ORAL_TABLET | Freq: Every day | ORAL | Status: DC
  Administered 2022-09-10 – 2022-09-15 (×4): 2.5 mg via ORAL
  Filled 2022-09-10 (×5): qty 1

## 2022-09-10 MED ORDER — VENLAFAXINE HCL ER 75 MG PO CP24
75.0000 mg | ORAL_CAPSULE | Freq: Every day | ORAL | Status: DC
  Administered 2022-09-10 – 2022-09-15 (×5): 75 mg via ORAL
  Filled 2022-09-10 (×6): qty 1

## 2022-09-10 MED ORDER — EZETIMIBE 10 MG PO TABS
10.0000 mg | ORAL_TABLET | Freq: Every day | ORAL | Status: DC
  Administered 2022-09-10 – 2022-09-15 (×4): 10 mg via ORAL
  Filled 2022-09-10 (×6): qty 1

## 2022-09-10 MED ORDER — CEFAZOLIN SODIUM-DEXTROSE 2-4 GM/100ML-% IV SOLN
2.0000 g | INTRAVENOUS | Status: AC
  Administered 2022-09-11: 2 g via INTRAVENOUS

## 2022-09-10 MED ORDER — CHLORHEXIDINE GLUCONATE 4 % EX LIQD: Freq: Once | CUTANEOUS | Status: DC

## 2022-09-10 MED ORDER — MORPHINE SULFATE (PF) 2 MG/ML IV SOLN
1.0000 mg | Freq: Once | INTRAVENOUS | Status: AC
  Administered 2022-09-10: 1 mg via INTRAVENOUS
  Filled 2022-09-10: qty 1

## 2022-09-10 MED ORDER — HYDROCODONE-ACETAMINOPHEN 5-325 MG PO TABS: 1.0000 | ORAL_TABLET | ORAL | Status: DC | PRN

## 2022-09-10 MED ORDER — METHOCARBAMOL 500 MG PO TABS: 500.0000 mg | ORAL_TABLET | Freq: Three times a day (TID) | ORAL | Status: DC | PRN

## 2022-09-10 MED ORDER — NITROGLYCERIN 0.4 MG SL SUBL: 0.4000 mg | SUBLINGUAL_TABLET | SUBLINGUAL | Status: DC | PRN

## 2022-09-10 MED ORDER — METOPROLOL SUCCINATE ER 25 MG PO TB24
12.5000 mg | ORAL_TABLET | Freq: Every day | ORAL | Status: DC
  Administered 2022-09-10 – 2022-09-15 (×4): 12.5 mg via ORAL
  Filled 2022-09-10 (×5): qty 1

## 2022-09-10 MED ORDER — TRANEXAMIC ACID-NACL 1000-0.7 MG/100ML-% IV SOLN
1000.0000 mg | INTRAVENOUS | Status: AC
  Administered 2022-09-11: 1000 mg via INTRAVENOUS
  Filled 2022-09-10: qty 100

## 2022-09-10 MED ORDER — SODIUM CHLORIDE 0.9 % IV SOLN: INTRAVENOUS | Status: DC

## 2022-09-10 MED ORDER — CEFAZOLIN SODIUM-DEXTROSE 2-4 GM/100ML-% IV SOLN
2.0000 g | INTRAVENOUS | Status: AC
  Administered 2022-09-10: 2 g via INTRAVENOUS
  Filled 2022-09-10: qty 100

## 2022-09-10 NOTE — ED Triage Notes (Signed)
Patient was found on floor last night and placed back in bed; today was found screaming in pain with left hip deformity.

## 2022-09-10 NOTE — H&P (Addendum)
History and Physical    Kathleen Walls KPT:[id number] DOB: 10-01-1940 DOA: 09/10/2022  Referring MD/NP/PA:   PCP: [facility]   Patient coming from:  The patient is coming from SNF        Chief Complaint: fall and left hip pain  HPI: Kathleen Walls is a 82 y.o. female with medical history significant of dementia, hypertension, hyperlipidemia, prediabetes, CAD with stent placement, stroke/TIA, GERD, depression with anxiety, kidney stone, breast cancer, diverticulitis, CREST syndrome, who presents with fall and left hip pain.  Patient has dementia, cannot provide any medical history.  Per husband, patient was reportedly fell last night, and was found on the floor.  Patient complains of left hip pain, which seems to be severe.  Patient is agitated.  Per husband, patient does not seem to have chest pain or abdominal pain.  No active respiratory distress, cough, nausea, vomiting, diarrhea noted.  Not sure if patient has symptoms of UTI.  She moves all extremities except for left leg which is likely due to pain.  Per husband, patient's mental status is at baseline.  Data reviewed independently and ED Course: pt was found to have WBC 22.8, CK3 62, temperature normal, blood pressure 146/79, heart rate 105, 31, oxygen saturation 100% on room air.  Chest x-ray negative.  CT of head negative. X-ray of left hip showed comminuted intratrochanteric fracture of the proximal left femur with increased varus angulation. Pt is admitted to [place] bed as inpatient.  Dr. Sabra Heck of Ortho is consulted.   EKG: I have personally reviewed.  Sinus rhythm, QTc 472, low voltage, ST depression in inferior leads and precordial leads.   Review of Systems: Could not be reviewed due to dementia.    Allergy:  Allergies  Allergen Reactions   Alendronate Sodium Other (See Comments)    Unknown   Atorvastatin Other (See Comments)    Muscle Pain   Azithromycin Hives   Ciprofloxacin Other (See Comments)     Headache   Colestipol Other (See Comments)    Unknown   Lidocaine Hives   Lovastatin Other (See Comments)    Headache   Metronidazole Other (See Comments)    Headache   Niacin Other (See Comments)    Unknown   Risedronate Sodium Other (See Comments)    Unknown   Rosuvastatin Other (See Comments)    Unknown   Simvastatin Other (See Comments)    Headache    Past Medical History:  Diagnosis Date   Arthritis    Breast cancer ([place]) 2009    2009 breast cancer stage IIA T2 N0, ER positive, HER-2 negative, 2.5 cm primary tumor, poorly differentiated, status post wide local excision, one sentinel lymph node negative, high-risk Oncotype score 48   Cataracts, both eyes    Colitis    Complication of anesthesia    confusion   Coronary artery disease    Detached vitreous humor    Diverticulitis    Female bladder prolapse    Gastritis    GERD (gastroesophageal reflux disease)    Heart valve problem 06/19/2013   Leaking   Hemorrhoid    Hemorrhoids    History of kidney stones    Hyperlipemia    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 POSITIVE 2.5CM GRADE 3, STAGE 2   Kidney cysts    Left   Kidney stones    Lipoma of shoulder 2009   left   Migraine    Osteoarthritis    Osteopenia  Stented coronary artery    Stroke [facility])     Past Surgical History:  Procedure Laterality Date   ABDOMINAL HYSTERECTOMY     benign breast biopsy  2007   BREAST BIOPSY Left 2007   positive   BREAST CYST ASPIRATION Right    neg   BREAST LUMPECTOMY Left 2009    2009 breast cancer stage IIA T2 N0, ER positive, HER-2 negative, 2.5 cm primary tumor, poorly differentiated, status post wide local excision, one sentinel lymph node negative, high-risk Oncotype score 48   BREAST SURGERY     CARDIAC CATHETERIZATION N/A 06/22/2015   Procedure: Left Heart Cath and Coronary Angiography;  Surgeon: Teodoro Spray, MD;  Location: [facility];  Service: Cardiovascular;  Laterality: N/A;    CARDIAC CATHETERIZATION     CATARACT EXTRACTION     COLONOSCOPY     CORONARY ANGIOPLASTY     ENDARTERECTOMY Right 12/26/2017   Procedure: ENDARTERECTOMY CAROTID;  Surgeon: Katha Cabal, MD;  Location: [facility];  Service: Vascular;  Laterality: Right;   EYE SURGERY     FOOT FRACTURE SURGERY Left    PARTIAL HYSTERECTOMY     UPPER GASTROINTESTINAL ENDOSCOPY      Social History:  reports that she quit smoking about 29 years ago. Her smoking use included cigarettes. She has never used smokeless tobacco. She reports that she does not currently use alcohol. She reports that she does not use drugs.  Family History:  Family History  Problem Relation Age of Onset   Heart disease Mother    Diabetes Father    Heart disease Father    Diabetes Sister    Diabetes Brother    Cervical cancer Cousin    Breast cancer Cousin    Lung cancer Paternal Aunt    Prostate cancer Maternal Uncle    Leukemia Other    Hypertension Other      Prior to Admission medications   Medication Sig Start Date End Date Taking? Authorizing Provider  ALPRAZolam (XANAX) 0.25 MG tablet TAKE ONE-HALF TABLET BY MOUTH AT BEDTIME 01/17/15   [provider]  amLODipine (NORVASC) 2.5 MG tablet Take by mouth. 02/12/18 11/19/20  [provider]  aspirin EC 81 MG tablet Take 81 mg by mouth daily.     [provider]  Azelaic Acid 15 % cream Apply 1 application topically daily.  Patient not taking: Reported on 11/21/2021    [provider]  butalbital-acetaminophen-caffeine (FIORICET, ESGIC) 50-325-40 MG tablet Take 1-2 tablets by mouth daily as needed for headache.    [provider]  docusate sodium (COLACE) 100 MG capsule Take 100 mg by mouth daily as needed for mild constipation.  Patient not taking: Reported on 04/27/2022    [provider]  ezetimibe (ZETIA) 10 MG tablet Take 10 mg by mouth daily.  03/01/15   [provider]  haloperidol (HALDOL) 0.5 MG tablet Take  by mouth. 02/01/22   [provider]  hydrocortisone 2.5 % cream Apply topically. Patient not taking: Reported on 11/21/2021    [provider]  hydroxychloroquine (PLAQUENIL) 200 MG tablet Take 1 tablet by mouth once daily 04/22/19   [provider]  melatonin 3 MG TABS tablet Take by mouth. Patient not taking: Reported on 04/27/2022    [provider]  memantine (NAMENDA) 5 MG tablet Take 5 mg by mouth daily.    [provider]  metoprolol succinate (TOPROL-XL) 25 MG 24 hr tablet Take 25 mg by mouth  daily.  09/06/15   [provider]  mirtazapine (REMERON) 15 MG tablet Take 15 mg by mouth at bedtime. 04/18/22   [provider]  nitroGLYCERIN (NITROSTAT) 0.4 MG SL tablet Place 0.4 mg under the tongue every 5 (five) minutes as needed for chest pain. Patient not taking: Reported on 04/27/2022    [provider]  OLANZapine (ZYPREXA) 2.5 MG tablet Take 1 tablet (2.5 mg total) by mouth at bedtime. 02/02/22 02/02/23  [name], MD  omeprazole (PRILOSEC) 20 MG capsule Take 20 mg by mouth 2 (two) times daily before a meal.    [provider]  sodium chloride (MURO 128) 5 % ophthalmic solution Place 1 drop into the left eye at bedtime. Patient not taking: Reported on 04/27/2022    [provider]  venlafaxine XR (EFFEXOR-XR) 37.5 MG 24 hr capsule Take 37.5 mg by mouth daily. 04/05/19   [provider]  venlafaxine XR (EFFEXOR-XR) 75 MG 24 hr capsule Take 75 mg by mouth daily. 01/14/22   [provider]    Physical Exam: Vitals:   09/10/22 1000 09/10/22 1030 09/10/22 1100 09/10/22 1224  BP: (!) 146/79 128/76 (!) 141/80 (!) 166/67  Pulse: (!) 31 99  100  Resp:  '15 20 18  '$ Temp:      TempSrc:      SpO2: 100% 99%  97%  Weight:       General: seems to be in severe pain. Thin body habitus HEENT:       Eyes: PERRL, EOMI, no scleral icterus.       ENT: No discharge from the ears and nose       Neck:  No JVD, no bruit, no mass felt. Heme: No neck lymph node enlargement. Cardiac: S1/S2, RRR, No murmurs, No gallops or rubs. Respiratory: No rales, wheezing, rhonchi or rubs. GI: Soft, nondistended, nontender, no organomegaly, BS present. GU: No hematuria Ext: No pitting leg edema bilaterally. 1+DP/PT pulse bilaterally. Musculoskeletal: No joint deformities, No joint redness or warmth, no limitation of ROM in spin. Skin: No rashes.  Neuro: Patient recognizes her husband, not oriented to time and place, cranial nerves II-XII grossly intact, moves all extremities except for left leg Psych: Patient is not psychotic, no suicidal or hemocidal ideation.  Labs on Admission: I have personally reviewed following labs and imaging studies  CBC: Recent Labs  Lab 09/10/22 0937  WBC 22.8*  NEUTROABS 20.6*  HGB 10.5*  HCT 35.2*  MCV 83.8  PLT 297   Basic Metabolic Panel: Recent Labs  Lab 09/10/22 0937  NA 137  K 4.2  CL 104  CO2 28  GLUCOSE 140*  BUN 20  CREATININE 0.75  CALCIUM 8.9   GFR: Estimated Creatinine Clearance: 36.7 mL/min (by C-G formula based on SCr of 0.75 mg/dL). Liver Function Tests: No results for input(s): "AST", "ALT", "ALKPHOS", "BILITOT", "PROT", "ALBUMIN" in the last 168 hours. No results for input(s): "LIPASE", "AMYLASE" in the last 168 hours. No results for input(s): "AMMONIA" in the last 168 hours. Coagulation Profile: Recent Labs  Lab 09/10/22 0937  INR 1.0   Cardiac Enzymes: Recent Labs  Lab 09/10/22 0937  CKTOTAL 362*   BNP (last 3 results) No results for input(s): "PROBNP" in the last 8760 hours. HbA1C: No results for input(s): "HGBA1C" in the last 72 hours. CBG: No results for input(s): "GLUCAP" in the last 168 hours. Lipid Profile: No results for input(s): "CHOL", "HDL", "LDLCALC", "TRIG", "CHOLHDL", "LDLDIRECT" in the last 72 hours. Thyroid  Function Tests: No results for input(s): "TSH", "T4TOTAL", "FREET4", "T3FREE", "THYROIDAB" in the  last 72 hours. Anemia Panel: No results for input(s): "VITAMINB12", "FOLATE", "FERRITIN", "TIBC", "IRON", "RETICCTPCT" in the last 72 hours. Urine analysis:    Component Value Date/Time   COLORURINE YELLOW (A) 04/23/2022 1927   APPEARANCEUR HAZY (A) 04/23/2022 1927   LABSPEC 1.017 04/23/2022 1927   PHURINE 5.0 04/23/2022 1927   GLUCOSEU NEGATIVE 04/23/2022 1927   HGBUR NEGATIVE 04/23/2022 1927   BILIRUBINUR NEGATIVE 04/23/2022 1927   KETONESUR NEGATIVE 04/23/2022 1927   PROTEINUR NEGATIVE 04/23/2022 1927   NITRITE NEGATIVE 04/23/2022 1927   LEUKOCYTESUR MODERATE (A) 04/23/2022 1927   Sepsis Labs: '@LABRCNTIP'$ (procalcitonin:4,lacticidven:4) )No results found for this or any previous visit (from the past 240 hour(s)).   Radiological Exams on Admission: CT Head Wo Contrast  Result Date: 09/10/2022 CLINICAL DATA:  82 year old female status post fall, found down. Hip fracture. EXAM: CT HEAD WITHOUT CONTRAST TECHNIQUE: Contiguous axial images were obtained from the base of the skull through the vertex without intravenous contrast. RADIATION DOSE REDUCTION: This exam was performed according to the departmental dose-optimization program which includes automated exposure control, adjustment of the mA and/or kV according to patient size and/or use of iterative reconstruction technique. COMPARISON:  Brain MRI 11/16/2017.  Head CT 04/23/2022. FINDINGS: Brain: Small chronic infarct at the right superior frontal gyrus is stable from last year, was acute in 2019. Stable cerebral volume. No midline shift, ventriculomegaly, mass effect, evidence of mass lesion, intracranial hemorrhage or evidence of cortically based acute infarction. Stable gray-white matter differentiation throughout the brain. Vascular: Calcified atherosclerosis at the skull base. No suspicious intracranial vascular hyperdensity. Skull: No acute osseous abnormality identified. Sinuses/Orbits: Visualized paranasal sinuses and mastoids are  stable and well aerated. Other: No orbit or scalp soft tissue injury identified. IMPRESSION: 1. No acute intracranial abnormality or acute traumatic injury identified. 2. 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 Electronically Signed   By: Genevie Ann M.D.   On: 09/10/2022 10:05   DG Chest Port 1 View  Result Date: 09/10/2022 CLINICAL DATA:  Fall.  Hip fracture. EXAM: PORTABLE CHEST 1 VIEW COMPARISON:  Two-view chest x-ray 03/10/2022 FINDINGS: The heart size is normal. Atherosclerotic calcifications are present at the aortic arch. Lungs are clear. Calcification of the left breast is stable. The visualized soft tissues and bony thorax are otherwise unremarkable. IMPRESSION: 1. No acute cardiopulmonary disease. 2. Aortic atherosclerosis. Electronically Signed   By: San Morelle M.D.   On: 09/10/2022 09:52   DG Hip Unilat W or Wo Pelvis 2-3 Views Left  Result Date: 09/10/2022 CLINICAL DATA:  Fall. Patient found on floor last night. Left hip pain. EXAM: DG HIP (WITH OR WITHOUT PELVIS) 3V LEFT COMPARISON:  None Available. FINDINGS: Comminuted intratrochanteric fracture of the proximal left femur demonstrates increased varus angulation. Lesser trochanter is displaced. Visualized pelvis is intact. IMPRESSION: Comminuted intratrochanteric fracture of the proximal left femur with increased varus angulation. Electronically Signed   By: San Morelle M.D.   On: 09/10/2022 09:51      Assessment/Plan Principal Problem:   Closed left hip fracture (HCC) Active Problems:   Fall at home, initial encounter   Leukocytosis   Benign hypertension   Arteriosclerosis of coronary artery   HLD (hyperlipidemia)   CREST (calcinosis, Raynaud's phenomenon, esophageal dysfunction, sclerodactyly, telangiectasia) (HCC)   History of CVA (cerebrovascular accident)   Dementia (HCC)   Elevated CK   Normocytic anemia   Depression with anxiety   Protein-calorie  malnutrition, severe  ([place])   Assessment and Plan:  Closed left hip fracture ([place]): X-ray showed comminuted intratrochanteric fracture of the proximal left femur with increased varus angulation. Patient seems to have severe pain now. No neurovascular compromise. Orthopedic surgeon, Dr. Sabra Heck was consulted --> planning surgery tomorrow  - will admit to Med-surg bed - Pain control: prn morphine, percocet and tyleno - When necessary Zofran for nausea - Robaxin for muscle spasm - Lidoderm patch for pain - type and cross - INR/PTT - NPO after MN  Leukocytosis: WBC 22.8, no fever, no source of infection identified, likely due to stress-induced demargination. Patient does not have signs of infection. -Follow-up CBC -Follow-up urinalysis  Fall at home, initial encounter - PT/OT when able to (not ordered now)  Benign hypertension -Amlodipine, metoprolol  Arteriosclerosis of coronary artery: No CP per her husband -ASA and zetia  HLD (hyperlipidemia) -Zetia  CREST (calcinosis, Raynaud's phenomenon, esophageal dysfunction, sclerodactyly, telangiectasia) (HCC) -Plaquenil  History of CVA (cerebrovascular accident) -ASA and zetia  Dementia ([place]) -Continue home olanzapine 2.5 mg at bedtime -As needed Haldol for agitation  Elevated CK: CK level 362 -IV fluid: 500 cc normal saline, then 75 cc/h -Repeat CK level in morning  Normocytic anemia: Hemoglobin 10.5 (11.0 on 03/25/2022), slightly dropped likely due to hip fracture -Follow-up with CBC  Depression with anxiety -Continue home medications: Remeron, Xanax, venlafaxine  Protein-calorie malnutrition, severe ([place]): Body weight 42.1 kg, BMI 16.44 -Consult to nutrition    Perioperative Cardiac Risk: pt has multiple comorbidities as listed above, including hx of CAD with stent placement, but does not have recent history of cardiac events.  On examination, patient does not have leg edema or JVD.  No signs of CHF.  Has history of dementia, is a resident  of nursing home currently. At this time point, no further work up is needed. Patient's GUPTA score perioperative myocardial infarction or cardaic arrest is 2.3 %, which is moderate risk.  I discussed the risk with her husband.  Her husband would like to proceed for surgery.      DVT ppx: SCD  Code Status: Full code per husband  Family Communication:   Yes, patient's  husband  at bed side.     Disposition Plan:  Anticipate discharge back to previous environment  Consults called:    Dr. Sabra Heck of Ortho is consulted.  Admission status and Level of care: Med-Surg:   as inpt      Dispo: The patient is from: SNF              Anticipated d/c is to: SNF              Anticipated d/c date is: 2 days              Patient currently is not medically stable to d/c.    Severity of Illness:  The appropriate patient status for this patient is INPATIENT. Inpatient status is judged to be reasonable and necessary in order to provide the required intensity of service to ensure the patient's safety. The patient's presenting symptoms, physical exam findings, and initial radiographic and laboratory data in the context of their chronic comorbidities is felt to place them at high risk for further clinical deterioration. Furthermore, it is not anticipated that the patient will be medically stable for discharge from the hospital within 2 midnights of admission.   * I certify that at the point of admission it is my clinical judgment that the patient will require inpatient  hospital care spanning beyond 2 midnights from the point of admission due to high intensity of service, high risk for further deterioration and high frequency of surveillance required.*       Date of Service 09/10/2022    Ivor Costa [facility]   If 7PM-7AM, please contact night-coverage [email or web address] 09/10/2022, 4:37 PM

## 2022-09-10 NOTE — ED Notes (Signed)
Advised nurse that patient has ready bed 

## 2022-09-10 NOTE — ED Provider Notes (Signed)
[facility] Provider Note    Event Date/Time   First MD Initiated Contact with Patient 09/10/22 [id number]     (approximate)   History   Fall and Hip Pain   HPI  GAYLYN BERISH is a 82 y.o. female   Past medical history of dementia from [place], hyperlipidemia, osteoarthritis, CAD, stroke, breast cancer who presents to the emergency department with a fall sustained last night and left hip pain.  Reportedly staff found her beside her bed last night and helped her back to bed and this morning she would refuse to get out of bed due to left hip pain.   Patient only complains of left hip pain and cannot describe the events surrounding her injury and denies any other medical complaints or pain.  There is a deformity to the left upper femur/hip.  She is neurovascular intact.  Does not take blood thinners per medical chart review  Independent Historian contributed to assessment above: EMS  External Medical Documents Reviewed: August and September 2023 emergency department visit for unwitnessed falls; these notes states she has advanced dementia and was unable to provide much of the history at that time due to her dementia as well.      Physical Exam   Triage Vital Signs: ED Triage Vitals  Enc Vitals Group     BP      Pulse      Resp      Temp      Temp src      SpO2      Weight      Height      Head Circumference      Peak Flow      Pain Score      Pain Loc      Pain Edu?      Excl. in [place]?     Most recent vital signs: Vitals:   09/10/22 0930 09/10/22 1000  BP:  (!) 146/79  Pulse:  (!) 31  Temp: 98.1 F (36.7 C)   SpO2:  100%    General: Awake, no distress.  CV:  Good peripheral perfusion.  Resp:  Normal effort.  Abd:  No distention.  Other:  Awake alert disoriented with no signs of head trauma, neck supple with full range of motion, no signs of injury to the upper extremities torso abdomen or right lower extremity though she does have a  deformity to the left upper portion of the femur neurovascular intact sensate   ED Results / Procedures / Treatments   Labs (all labs ordered are listed, but only abnormal results are displayed) Labs Reviewed  BASIC METABOLIC PANEL - Abnormal; Notable for the following components:      Result Value   Glucose, Bld 140 (*)    All other components within normal limits  CBC WITH DIFFERENTIAL/PLATELET - Abnormal; Notable for the following components:   WBC 22.8 (*)    Hemoglobin 10.5 (*)    HCT 35.2 (*)    MCH 25.0 (*)    MCHC 29.8 (*)    RDW 15.9 (*)    Neutro Abs 20.6 (*)    Lymphs Abs 0.5 (*)    Monocytes Absolute 1.1 (*)    Abs Immature Granulocytes 0.46 (*)    All other components within normal limits  CK - Abnormal; Notable for the following components:   Total CK 362 (*)    All other components within normal limits  URINALYSIS, ROUTINE W REFLEX MICROSCOPIC  TYPE AND SCREEN     I ordered and reviewed the above labs they are notable for WBC 22 Hgb 10.5 w CK 300s  EKG  ED ECG REPORT I, Lucillie Garfinkel, the attending physician, personally viewed and interpreted this ECG.   Date: 09/10/2022  EKG Time: 1004  Rate: 105  Rhythm: sinus tachy  Axis: nl  Intervals:none  ST&T Change: no stemi; isolated TWI AVF and STD V3    RADIOLOGY I independently reviewed and interpreted x-ray of the L hip and see an intertrochanteric fracture   PROCEDURES:  Critical Care performed: No  Procedures   MEDICATIONS ORDERED IN ED: Medications - No data to display  External physician / consultants:  I spoke with Dr Sabra Heck ortho regarding care plan for this patient.   IMPRESSION / MDM / ASSESSMENT AND PLAN / ED COURSE  I reviewed the triage vital signs and the nursing notes.                                Patient's presentation is most consistent with acute presentation with potential threat to life or bodily function.  Differential diagnosis includes, but is not limited to,  hip fracture or dislocation, intracranial bleeding, electrolyte disturbance, rhabdomyolysis, dysrhythmia   The patient is on the cardiac monitor to evaluate for evidence of arrhythmia and/or significant heart rate changes.  MDM: Unwitnessed fall and left hip deformity dementia at baseline will obtain basic labs preop labs EKG as well as trauma imaging including CT of the head and left hip x-ray.  Findings significant for hip fracture I consulted Dr. Earnestine Leys of orthopedics and he will operate tomorrow.  N.p.o. at midnight.  Comfortable at rest.  CT of the head is pending.  She has a white blood cell count of 22 and elevation of immature granulocytes;  so we will check a urinalysis for signs of infection in the setting of unknown mechanism fall, patient demented and cannot describe symptoms.  She will be admitted to hospitalist service       FINAL CLINICAL IMPRESSION(S) / ED DIAGNOSES   Final diagnoses:  Closed left hip fracture, initial encounter [facility])  Fall, initial encounter     Rx / DC Orders   ED Discharge Orders     None        Note:  This document was prepared using Dragon voice recognition software and may include unintentional dictation errors.    Lucillie Garfinkel, MD 09/10/22 1007

## 2022-09-10 NOTE — Consult Note (Signed)
ORTHOPAEDIC CONSULTATION  REQUESTING PHYSICIAN: Ivor Costa, MD  Chief Complaint: Left hip pain  HPI: Kathleen Walls is a 82 y.o. female who complains of left hip pain after a fall getting out of her bed at the nursing home last night.  He stays at [facility] memory care unit.  She has been advanced dementia.  She was brought to the emergency room where exam and x-rays revealed a comminuted intertrochanteric fracture of the left hip.  She is being admitted for medical evaluation and probable surgical fixation of the fracture.  Husband is present and I discussed treatment options with him.  The patient has been functional and walking and eating despite confusion.  I have recommended surgical fixation of the fracture to minimize pain and suffering and facilitate nursing care.  He agrees with this.  We will stabilize her today and hydrate her overnight.  Will plan on doing trochanteric fixation nail on the left hip tomorrow.  Past Medical History:  Diagnosis Date   Arthritis    Breast cancer [facility]) 2009    2009 breast cancer stage IIA T2 N0, ER positive, HER-2 negative, 2.5 cm primary tumor, poorly differentiated, status post wide local excision, one sentinel lymph node negative, high-risk Oncotype score 48   Cataracts, both eyes    Colitis    Complication of anesthesia    confusion   Coronary artery disease    Detached vitreous humor    Diverticulitis    Female bladder prolapse    Gastritis    GERD (gastroesophageal reflux disease)    Heart valve problem 06/19/2013   Leaking   Hemorrhoid    Hemorrhoids    History of kidney stones    Hyperlipemia    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, STAGE 2   Kidney cysts    Left   Kidney stones    Lipoma of shoulder 2009   left   Migraine    Osteoarthritis    Osteopenia    Stented coronary artery    Stroke [facility])    Past Surgical History:  Procedure Laterality Date   ABDOMINAL HYSTERECTOMY      benign breast biopsy  2007   BREAST BIOPSY Left 2007   positive   BREAST CYST ASPIRATION Right    neg   BREAST LUMPECTOMY Left 2009    2009 breast cancer stage IIA T2 N0, ER positive, HER-2 negative, 2.5 cm primary tumor, poorly differentiated, status post wide local excision, one sentinel lymph node negative, high-risk Oncotype score 48   BREAST SURGERY     CARDIAC CATHETERIZATION N/A 06/22/2015   Procedure: Left Heart Cath and Coronary Angiography;  Surgeon: Teodoro Spray, MD;  Location: [place] CV LAB;  Service: Cardiovascular;  Laterality: N/A;   CARDIAC CATHETERIZATION     CATARACT EXTRACTION     COLONOSCOPY     CORONARY ANGIOPLASTY     ENDARTERECTOMY Right 12/26/2017   Procedure: ENDARTERECTOMY CAROTID;  Surgeon: Katha Cabal, MD;  Location: [facility] ORS;  Service: Vascular;  Laterality: Right;   EYE SURGERY     FOOT FRACTURE SURGERY Left    PARTIAL HYSTERECTOMY     UPPER GASTROINTESTINAL ENDOSCOPY     Social History   Socioeconomic History   Marital status: Married    Spouse name: Not on file   Number of children: Not on file   Years of education: Not on file   Highest education level: Not on file  Occupational History   Not on file  Tobacco Use   Smoking status: Former    Types: Cigarettes    Quit date: 08/14/1993    Years since quitting: 29.0   Smokeless tobacco: Never  Vaping Use   Vaping Use: Never used  Substance and Sexual Activity   Alcohol use: Not Currently    Alcohol/week: 0.0 standard drinks of alcohol    Comment: rare   Drug use: No   Sexual activity: Yes    Birth control/protection: None, Post-menopausal  Other Topics Concern   Not on file  Social History Narrative   Not on file   Social Determinants of Health   Financial Resource Strain: Not on file  Food Insecurity: Not on file  Transportation Needs: Not on file  Physical Activity: Not on file  Stress: Not on file  Social Connections: Not on file   Family History  Problem  Relation Age of Onset   Heart disease Mother    Diabetes Father    Heart disease Father    Diabetes Sister    Diabetes Brother    Cervical cancer Cousin    Breast cancer Cousin    Lung cancer Paternal Aunt    Prostate cancer Maternal Uncle    Leukemia Other    Hypertension Other    Allergies  Allergen Reactions   Alendronate Sodium Other (See Comments)    Unknown   Atorvastatin Other (See Comments)    Muscle Pain   Azithromycin Hives   Ciprofloxacin Other (See Comments)    Headache   Colestipol Other (See Comments)    Unknown   Lidocaine Hives   Lovastatin Other (See Comments)    Headache   Metronidazole Other (See Comments)    Headache   Niacin Other (See Comments)    Unknown   Risedronate Sodium Other (See Comments)    Unknown   Rosuvastatin Other (See Comments)    Unknown   Simvastatin Other (See Comments)    Headache   Prior to Admission medications   Medication Sig Start Date End Date Taking? Authorizing Provider  ALPRAZolam (XANAX) 0.25 MG tablet TAKE ONE-HALF TABLET BY MOUTH AT BEDTIME 01/17/15   [provider]  amLODipine (NORVASC) 2.5 MG tablet Take by mouth. 02/12/18 11/19/20  [provider]  aspirin EC 81 MG tablet Take 81 mg by mouth daily.     [provider]  Azelaic Acid 15 % cream Apply 1 application topically daily.  Patient not taking: Reported on 11/21/2021    [provider]  butalbital-acetaminophen-caffeine (FIORICET, ESGIC) 50-325-40 MG tablet Take 1-2 tablets by mouth daily as needed for headache.    [provider]  docusate sodium (COLACE) 100 MG capsule Take 100 mg by mouth daily as needed for mild constipation.  Patient not taking: Reported on 04/27/2022    [provider]  ezetimibe (ZETIA) 10 MG tablet Take 10 mg by mouth daily.  03/01/15   [provider]  haloperidol (HALDOL) 0.5 MG tablet Take by mouth. 02/01/22   [provider]  hydrocortisone 2.5 % cream Apply  topically. Patient not taking: Reported on 11/21/2021    [provider]  hydroxychloroquine (PLAQUENIL) 200 MG tablet Take 1 tablet by mouth once daily 04/22/19   [provider]  melatonin 3 MG TABS tablet Take by mouth. Patient not taking: Reported on 04/27/2022    [provider]  memantine (NAMENDA) 5 MG tablet Take 5 mg by mouth daily.    [provider]  metoprolol succinate (TOPROL-XL) 25 MG 24 hr tablet Take 25 mg by mouth daily.  09/06/15   [provider]  mirtazapine (REMERON) 15 MG tablet Take 15 mg by mouth at bedtime. 04/18/22   [provider]  nitroGLYCERIN (NITROSTAT) 0.4 MG SL tablet Place 0.4 mg under the tongue every 5 (five) minutes as needed for chest pain. Patient not taking: Reported on 04/27/2022    [provider]  OLANZapine (ZYPREXA) 2.5 MG tablet Take 1 tablet (2.5 mg total) by mouth at bedtime. 02/02/22 02/02/23  [name], MD  omeprazole (PRILOSEC) 20 MG capsule Take 20 mg by mouth 2 (two) times daily before a meal.    [provider]  sodium chloride (MURO 128) 5 % ophthalmic solution Place 1 drop into the left eye at bedtime. Patient not taking: Reported on 04/27/2022    [provider]  venlafaxine XR (EFFEXOR-XR) 37.5 MG 24 hr capsule Take 37.5 mg by mouth daily. 04/05/19   [provider]  venlafaxine XR (EFFEXOR-XR) 75 MG 24 hr capsule Take 75 mg by mouth daily. 01/14/22   [provider]   CT Head Wo Contrast  Result Date: 09/10/2022 CLINICAL DATA:  82 year old female status post fall, found down. Hip fracture. EXAM: CT HEAD WITHOUT CONTRAST TECHNIQUE: Contiguous axial images were obtained from the base of the skull through the vertex without intravenous contrast. RADIATION DOSE REDUCTION: This exam was performed according to the departmental dose-optimization program which includes automated exposure control, adjustment of the mA and/or kV according to patient size  and/or use of iterative reconstruction technique. COMPARISON:  Brain MRI 11/16/2017.  Head CT 04/23/2022. FINDINGS: Brain: Small chronic infarct at the right superior frontal gyrus is stable from last year, was acute in 2019. Stable cerebral volume. No midline shift, ventriculomegaly, mass effect, evidence of mass lesion, intracranial hemorrhage or evidence of cortically based acute infarction. Stable gray-white matter differentiation throughout the brain. Vascular: Calcified atherosclerosis at the skull base. No suspicious intracranial vascular hyperdensity. Skull: No acute osseous abnormality identified. Sinuses/Orbits: Visualized paranasal sinuses and mastoids are stable and well aerated. Other: No orbit or scalp soft tissue injury identified. IMPRESSION: 1. No acute intracranial abnormality or acute traumatic injury identified. 2. 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 Electronically Signed   By: Genevie Ann M.D.   On: 09/10/2022 10:05   DG Chest Port 1 View  Result Date: 09/10/2022 CLINICAL DATA:  Fall.  Hip fracture. EXAM: PORTABLE CHEST 1 VIEW COMPARISON:  Two-view chest x-ray 03/10/2022 FINDINGS: The heart size is normal. Atherosclerotic calcifications are present at the aortic arch. Lungs are clear. Calcification of the left breast is stable. The visualized soft tissues and bony thorax are otherwise unremarkable. IMPRESSION: 1. No acute cardiopulmonary disease. 2. Aortic atherosclerosis. Electronically Signed   By: San Morelle M.D.   On: 09/10/2022 09:52   DG Hip Unilat W or Wo Pelvis 2-3 Views Left  Result Date: 09/10/2022 CLINICAL DATA:  Fall. Patient found on floor last night. Left hip pain. EXAM: DG HIP (WITH OR WITHOUT PELVIS) 3V LEFT COMPARISON:  None Available. FINDINGS: Comminuted intratrochanteric fracture of the proximal left femur demonstrates increased varus angulation. Lesser trochanter is displaced. Visualized pelvis is intact. IMPRESSION: Comminuted  intratrochanteric fracture of the proximal left femur with increased varus angulation. Electronically Signed   By: San Morelle M.D.   On: 09/10/2022 09:51    Positive ROS: All other systems have been reviewed and were otherwise negative with the  exception of those mentioned in the HPI and as above.  Physical Exam: General: Alert, no acute distress Cardiovascular: No pedal edema Respiratory: No cyanosis, no use of accessory musculature GI: No organomegaly, abdomen is soft and non-tender Skin: No lesions in the area of chief complaint Neurologic: Sensation intact distally Psychiatric: Patient is competent for consent with normal mood and affect Lymphatic: No axillary or cervical lymphadenopathy  MUSCULOSKELETAL: Patient is thin but awake.  She is unable to converse properly.  She does talk some.  Not cognizant of her surroundings.  The left leg is shortened and rotated.  The hip has severe pain with movement.  Skin is intact.  Neurovascular status is good distally.  The right lower extremity is normal to exam.  Upper extremities are normal.  Cervical and lumbar spine are normal.  Assessment: Comminuted displaced intertrochanteric fracture left hip Advanced dementia  Plan: Plan for a left hip nailing with a trochanteric fixation nail tomorrow if cleared by the medical service.    Park Breed, MD [phone]   09/10/2022 10:56 AM

## 2022-09-11 ENCOUNTER — Inpatient Hospital Stay: Payer: 59 | Admitting: Anesthesiology

## 2022-09-11 ENCOUNTER — Other Ambulatory Visit: Payer: Self-pay

## 2022-09-11 ENCOUNTER — Encounter: Payer: Self-pay | Admitting: Internal Medicine

## 2022-09-11 ENCOUNTER — Inpatient Hospital Stay: Payer: 59

## 2022-09-11 ENCOUNTER — Encounter
Admission: EM | Disposition: A | Discharge status: Skilled Nursing Facility | Payer: Self-pay | Source: Skilled Nursing Facility | Attending: Internal Medicine

## 2022-09-11 DIAGNOSIS — F01511 Vascular dementia, unspecified severity, with agitation: Secondary | ICD-10-CM | POA: Diagnosis not present

## 2022-09-11 DIAGNOSIS — I10 Essential (primary) hypertension: Secondary | ICD-10-CM

## 2022-09-11 DIAGNOSIS — M341 CR(E)ST syndrome: Secondary | ICD-10-CM | POA: Diagnosis not present

## 2022-09-11 DIAGNOSIS — S72002A Fracture of unspecified part of neck of left femur, initial encounter for closed fracture: Secondary | ICD-10-CM | POA: Diagnosis not present

## 2022-09-11 HISTORY — PX: INTRAMEDULLARY (IM) NAIL INTERTROCHANTERIC: SHX5875

## 2022-09-11 LAB — BASIC METABOLIC PANEL
Anion gap: 9 (ref 5–15)
BUN: 16 mg/dL (ref 8–23)
CO2: 27 mmol/L (ref 22–32)
Calcium: 8.5 mg/dL — ABNORMAL LOW (ref 8.9–10.3)
Chloride: 102 mmol/L (ref 98–111)
Creatinine, Ser: 0.62 mg/dL (ref 0.44–1.00)
GFR, Estimated: >60 mL/min (ref >60)
Glucose, Bld: 115 mg/dL — ABNORMAL HIGH (ref 70–99)
Potassium: 3.7 mmol/L (ref 3.5–5.1)
Sodium: 138 mmol/L (ref 135–145)

## 2022-09-11 LAB — URINALYSIS, ROUTINE W REFLEX MICROSCOPIC
Bilirubin Urine: NEGATIVE
Glucose, UA: NEGATIVE mg/dL
Hgb urine dipstick: NEGATIVE
Ketones, ur: 5 mg/dL — AB
Leukocytes,Ua: NEGATIVE
Nitrite: NEGATIVE
Protein, ur: NEGATIVE mg/dL
Specific Gravity, Urine: 1.012 (ref 1.005–1.030)
pH: 7 (ref 5.0–8.0)

## 2022-09-11 LAB — CBC
HCT: 29.8 % — ABNORMAL LOW (ref 36.0–46.0)
Hemoglobin: 9.4 g/dL — ABNORMAL LOW (ref 12.0–15.0)
MCH: 25.5 pg — ABNORMAL LOW (ref 26.0–34.0)
MCHC: 31.5 g/dL (ref 30.0–36.0)
MCV: 81 fL (ref 80.0–100.0)
Platelets: 239 10*3/uL (ref 150–400)
RBC: 3.68 MIL/uL — ABNORMAL LOW (ref 3.87–5.11)
RDW: 15.6 % — ABNORMAL HIGH (ref 11.5–15.5)
WBC: 16.8 10*3/uL — ABNORMAL HIGH (ref 4.0–10.5)
nRBC: 0 % (ref 0.0–0.2)

## 2022-09-11 LAB — CK: Total CK: 457 U/L — ABNORMAL HIGH (ref 38–234)

## 2022-09-11 SURGERY — FIXATION, FRACTURE, INTERTROCHANTERIC, WITH INTRAMEDULLARY ROD
Anesthesia: General | Site: Hip | Laterality: Left

## 2022-09-11 MED ORDER — METOCLOPRAMIDE HCL 5 MG/ML IJ SOLN: 5.0000 mg | Freq: Three times a day (TID) | INTRAMUSCULAR | Status: DC | PRN

## 2022-09-11 MED ORDER — MENTHOL 3 MG MT LOZG: 1.0000 | LOZENGE | OROMUCOSAL | Status: DC | PRN

## 2022-09-11 MED ORDER — BUPIVACAINE-EPINEPHRINE (PF) 0.5% -1:200000 IJ SOLN
INTRAMUSCULAR | Status: AC
  Filled 2022-09-11: qty 30

## 2022-09-11 MED ORDER — ACETAMINOPHEN 10 MG/ML IV SOLN
INTRAVENOUS | Status: AC
  Filled 2022-09-11: qty 100

## 2022-09-11 MED ORDER — FENTANYL CITRATE (PF) 100 MCG/2ML IJ SOLN: 25.0000 ug | INTRAMUSCULAR | Status: DC | PRN

## 2022-09-11 MED ORDER — ALUM & MAG HYDROXIDE-SIMETH 200-200-20 MG/5ML PO SUSP: 30.0000 mL | ORAL | Status: DC | PRN

## 2022-09-11 MED ORDER — BUPIVACAINE-EPINEPHRINE (PF) 0.5% -1:200000 IJ SOLN
INTRAMUSCULAR | Status: DC | PRN
  Administered 2022-09-11: 30 mL

## 2022-09-11 MED ORDER — LACTATED RINGERS IV SOLN: INTRAVENOUS | Status: DC | PRN

## 2022-09-11 MED ORDER — CEFAZOLIN SODIUM-DEXTROSE 2-4 GM/100ML-% IV SOLN
2.0000 g | Freq: Three times a day (TID) | INTRAVENOUS | Status: AC
  Administered 2022-09-11 – 2022-09-12 (×3): 2 g via INTRAVENOUS
  Filled 2022-09-11 (×3): qty 100

## 2022-09-11 MED ORDER — OXYCODONE HCL 5 MG PO TABS: 5.0000 mg | ORAL_TABLET | Freq: Once | ORAL | Status: DC | PRN

## 2022-09-11 MED ORDER — ONDANSETRON HCL 4 MG PO TABS: 4.0000 mg | ORAL_TABLET | Freq: Four times a day (QID) | ORAL | Status: DC | PRN

## 2022-09-11 MED ORDER — DEXMEDETOMIDINE HCL IN NACL 80 MCG/20ML IV SOLN
INTRAVENOUS | Status: DC | PRN
  Administered 2022-09-11: 8 ug via BUCCAL
  Administered 2022-09-11: 12 ug via BUCCAL
  Administered 2022-09-11: 4 ug via BUCCAL

## 2022-09-11 MED ORDER — FLEET ENEMA 7-19 GM/118ML RE ENEM: 1.0000 | ENEMA | Freq: Once | RECTAL | Status: DC | PRN

## 2022-09-11 MED ORDER — DEXAMETHASONE SODIUM PHOSPHATE 10 MG/ML IJ SOLN
INTRAMUSCULAR | Status: DC | PRN
  Administered 2022-09-11: 10 mg via INTRAVENOUS

## 2022-09-11 MED ORDER — ONDANSETRON HCL 4 MG/2ML IJ SOLN
INTRAMUSCULAR | Status: AC
  Filled 2022-09-11: qty 2

## 2022-09-11 MED ORDER — ACETAMINOPHEN 10 MG/ML IV SOLN
INTRAVENOUS | Status: DC | PRN
  Administered 2022-09-11: 1000 mg via INTRAVENOUS

## 2022-09-11 MED ORDER — PHENYLEPHRINE HCL (PRESSORS) 10 MG/ML IV SOLN
INTRAVENOUS | Status: DC | PRN
  Administered 2022-09-11: 80 ug via INTRAVENOUS

## 2022-09-11 MED ORDER — TRANEXAMIC ACID-NACL 1000-0.7 MG/100ML-% IV SOLN
1000.0000 mg | Freq: Once | INTRAVENOUS | Status: AC
  Administered 2022-09-11: 1000 mg via INTRAVENOUS
  Filled 2022-09-11: qty 100

## 2022-09-11 MED ORDER — FENTANYL CITRATE (PF) 100 MCG/2ML IJ SOLN
INTRAMUSCULAR | Status: AC
  Filled 2022-09-11: qty 2

## 2022-09-11 MED ORDER — LIDOCAINE HCL (CARDIAC) PF 100 MG/5ML IV SOSY
PREFILLED_SYRINGE | INTRAVENOUS | Status: DC | PRN
  Administered 2022-09-11: 40 mg via INTRAVENOUS

## 2022-09-11 MED ORDER — 0.9 % SODIUM CHLORIDE (POUR BTL) OPTIME
TOPICAL | Status: DC | PRN
  Administered 2022-09-11: 1000 mL

## 2022-09-11 MED ORDER — ROCURONIUM BROMIDE 10 MG/ML (PF) SYRINGE
PREFILLED_SYRINGE | INTRAVENOUS | Status: AC
  Filled 2022-09-11: qty 10

## 2022-09-11 MED ORDER — TRANEXAMIC ACID-NACL 1000-0.7 MG/100ML-% IV SOLN
INTRAVENOUS | Status: AC
  Filled 2022-09-11: qty 100

## 2022-09-11 MED ORDER — PROPOFOL 1000 MG/100ML IV EMUL
INTRAVENOUS | Status: AC
  Filled 2022-09-11: qty 100

## 2022-09-11 MED ORDER — ONDANSETRON HCL 4 MG/2ML IJ SOLN
INTRAMUSCULAR | Status: DC | PRN
  Administered 2022-09-11: 4 mg via INTRAVENOUS

## 2022-09-11 MED ORDER — FENTANYL CITRATE (PF) 100 MCG/2ML IJ SOLN
INTRAMUSCULAR | Status: DC | PRN
  Administered 2022-09-11 (×2): 50 ug via INTRAVENOUS

## 2022-09-11 MED ORDER — OXYCODONE HCL 5 MG/5ML PO SOLN: 5.0000 mg | Freq: Once | ORAL | Status: DC | PRN

## 2022-09-11 MED ORDER — ENOXAPARIN SODIUM 30 MG/0.3ML IJ SOSY
30.0000 mg | PREFILLED_SYRINGE | INTRAMUSCULAR | Status: DC
  Administered 2022-09-12 – 2022-09-15 (×4): 30 mg via SUBCUTANEOUS
  Filled 2022-09-11 (×4): qty 0.3

## 2022-09-11 MED ORDER — ZOLPIDEM TARTRATE 5 MG PO TABS
5.0000 mg | ORAL_TABLET | Freq: Every evening | ORAL | Status: DC | PRN
  Administered 2022-09-12: 5 mg via ORAL
  Filled 2022-09-11: qty 1

## 2022-09-11 MED ORDER — MAGNESIUM HYDROXIDE 400 MG/5ML PO SUSP: 30.0000 mL | Freq: Every day | ORAL | Status: DC | PRN

## 2022-09-11 MED ORDER — METOCLOPRAMIDE HCL 5 MG PO TABS: 5.0000 mg | ORAL_TABLET | Freq: Three times a day (TID) | ORAL | Status: DC | PRN

## 2022-09-11 MED ORDER — DEXAMETHASONE SODIUM PHOSPHATE 10 MG/ML IJ SOLN
INTRAMUSCULAR | Status: AC
  Filled 2022-09-11: qty 1

## 2022-09-11 MED ORDER — SENNA 8.6 MG PO TABS
1.0000 | ORAL_TABLET | Freq: Two times a day (BID) | ORAL | Status: DC
  Administered 2022-09-12 – 2022-09-15 (×5): 8.6 mg via ORAL
  Filled 2022-09-11 (×6): qty 1

## 2022-09-11 MED ORDER — ONDANSETRON HCL 4 MG/2ML IJ SOLN: 4.0000 mg | Freq: Four times a day (QID) | INTRAMUSCULAR | Status: DC | PRN

## 2022-09-11 MED ORDER — ROCURONIUM BROMIDE 100 MG/10ML IV SOLN
INTRAVENOUS | Status: DC | PRN
  Administered 2022-09-11: 50 mg via INTRAVENOUS

## 2022-09-11 MED ORDER — SODIUM CHLORIDE 0.45 % IV SOLN: INTRAVENOUS | Status: DC

## 2022-09-11 MED ORDER — BISACODYL 10 MG RE SUPP: 10.0000 mg | Freq: Every day | RECTAL | Status: DC | PRN

## 2022-09-11 MED ORDER — GENTAMICIN SULFATE 40 MG/ML IJ SOLN
INTRAMUSCULAR | Status: AC
  Filled 2022-09-11: qty 2

## 2022-09-11 MED ORDER — PROPOFOL 10 MG/ML IV BOLUS
INTRAVENOUS | Status: AC
  Filled 2022-09-11: qty 20

## 2022-09-11 MED ORDER — SODIUM CHLORIDE 0.9 % IV SOLN
INTRAVENOUS | Status: DC | PRN
  Administered 2022-09-11: 80 mg

## 2022-09-11 MED ORDER — LIDOCAINE HCL (PF) 2 % IJ SOLN
INTRAMUSCULAR | Status: AC
  Filled 2022-09-11: qty 5

## 2022-09-11 MED ORDER — CEFAZOLIN SODIUM-DEXTROSE 2-4 GM/100ML-% IV SOLN
INTRAVENOUS | Status: AC
  Filled 2022-09-11: qty 100

## 2022-09-11 MED ORDER — MORPHINE SULFATE (PF) 2 MG/ML IV SOLN
0.5000 mg | INTRAVENOUS | Status: DC | PRN
  Administered 2022-09-12 (×2): 0.5 mg via INTRAVENOUS
  Filled 2022-09-11 (×2): qty 1

## 2022-09-11 MED ORDER — ADULT MULTIVITAMIN W/MINERALS CH
1.0000 | ORAL_TABLET | Freq: Every day | ORAL | Status: DC
  Administered 2022-09-13 – 2022-09-15 (×3): 1 via ORAL
  Filled 2022-09-11 (×4): qty 1

## 2022-09-11 MED ORDER — HYDROCODONE-ACETAMINOPHEN 5-325 MG PO TABS: 1.0000 | ORAL_TABLET | ORAL | Status: DC | PRN

## 2022-09-11 MED ORDER — FERROUS SULFATE 325 (65 FE) MG PO TABS
325.0000 mg | ORAL_TABLET | Freq: Every day | ORAL | Status: DC
  Administered 2022-09-13 – 2022-09-15 (×3): 325 mg via ORAL
  Filled 2022-09-11 (×4): qty 1

## 2022-09-11 MED ORDER — SUGAMMADEX SODIUM 200 MG/2ML IV SOLN
INTRAVENOUS | Status: DC | PRN
  Administered 2022-09-11: 100 mg via INTRAVENOUS

## 2022-09-11 MED ORDER — ACETAMINOPHEN 325 MG PO TABS: 325.0000 mg | ORAL_TABLET | Freq: Four times a day (QID) | ORAL | Status: DC | PRN

## 2022-09-11 MED ORDER — PHENOL 1.4 % MT LIQD: 1.0000 | OROMUCOSAL | Status: DC | PRN

## 2022-09-11 MED ORDER — PROPOFOL 10 MG/ML IV BOLUS
INTRAVENOUS | Status: DC | PRN
  Administered 2022-09-11: 70 mg via INTRAVENOUS

## 2022-09-11 SURGICAL SUPPLY — 48 items
BIT DRILL CALIBRATED 4.2 (BIT) IMPLANT
BIT DRILL CANN 16 HIP (BIT) IMPLANT
BIT DRILL CANN STP 6/9 HIP (BIT) IMPLANT
BIT DRILL TAPERED 10 (BIT) IMPLANT
BLADE TFNA HELICAL 85 (Anchor) IMPLANT
BNDG COHESIVE 4X5 TAN STRL LF (GAUZE/BANDAGES/DRESSINGS) ×1 IMPLANT
CHLORAPREP W/TINT 26 (MISCELLANEOUS) ×2 IMPLANT
DRAPE C-ARMOR (DRAPES) IMPLANT
DRAPE INCISE 23X17 IOBAN STRL (DRAPES) ×1
DRAPE INCISE 23X17 STRL (DRAPES) ×1 IMPLANT
DRAPE INCISE IOBAN 23X17 STRL (DRAPES) ×1 IMPLANT
DRILL BIT CALIBRATED 4.2 (BIT) ×1
DRSG AQUACEL AG ADV 3.5X10 (GAUZE/BANDAGES/DRESSINGS) IMPLANT
DRSG AQUACEL AG ADV 3.5X14 (GAUZE/BANDAGES/DRESSINGS) IMPLANT
ELECT REM PT RETURN 9FT ADLT (ELECTROSURGICAL) ×1
ELECTRODE REM PT RTRN 9FT ADLT (ELECTROSURGICAL) ×1 IMPLANT
GAUZE SPONGE 4X4 12PLY STRL (GAUZE/BANDAGES/DRESSINGS) ×1 IMPLANT
GAUZE XEROFORM 1X8 LF (GAUZE/BANDAGES/DRESSINGS) IMPLANT
GLOVE SURG ORTHO 8.5 STRL (GLOVE) ×1 IMPLANT
GLOVE SURG UNDER LTX SZ8 (GLOVE) ×1 IMPLANT
GOWN STRL REUS W/ TWL LRG LVL3 (GOWN DISPOSABLE) ×1 IMPLANT
GOWN STRL REUS W/TWL LRG LVL3 (GOWN DISPOSABLE) ×1
GOWN STRL REUS W/TWL LRG LVL4 (GOWN DISPOSABLE) ×1 IMPLANT
GUIDEWIRE 3.2X400 (WIRE) IMPLANT
KIT TURNOVER KIT A (KITS) ×1 IMPLANT
MANIFOLD NEPTUNE II (INSTRUMENTS) ×1 IMPLANT
MAT ABSORB  FLUID 56X50 GRAY (MISCELLANEOUS) ×1
MAT ABSORB FLUID 56X50 GRAY (MISCELLANEOUS) ×1 IMPLANT
NAIL TROCH FIX 10X170 130 (Nail) IMPLANT
NDL SPNL 18GX3.5 QUINCKE PK (NEEDLE) ×1 IMPLANT
NEEDLE SPNL 18GX3.5 QUINCKE PK (NEEDLE) ×1 IMPLANT
NS IRRIG 1000ML POUR BTL (IV SOLUTION) IMPLANT
NS IRRIG 500ML POUR BTL (IV SOLUTION) ×1 IMPLANT
PACK HIP COMPR (MISCELLANEOUS) ×1 IMPLANT
REAMER ROD DEEP FLUTE 2.5X950 (INSTRUMENTS) IMPLANT
SCREW LOCK STAR 5X32 (Screw) IMPLANT
SOL PREP PVP 2OZ (MISCELLANEOUS) ×1
SOLUTION PREP PVP 2OZ (MISCELLANEOUS) ×1 IMPLANT
STAPLER SKIN PROX 35W (STAPLE) ×1 IMPLANT
SUCTION FRAZIER HANDLE 10FR (MISCELLANEOUS) ×1
SUCTION TUBE FRAZIER 10FR DISP (MISCELLANEOUS) ×1 IMPLANT
SUT VIC AB 0 CT1 36 (SUTURE) ×1 IMPLANT
SUT VIC AB 2-0 CT1 27 (SUTURE) ×1
SUT VIC AB 2-0 CT1 TAPERPNT 27 (SUTURE) ×1 IMPLANT
SYR 30ML LL (SYRINGE) ×1 IMPLANT
TRAP FLUID SMOKE EVACUATOR (MISCELLANEOUS) ×1 IMPLANT
WATER STERILE IRR 1000ML POUR (IV SOLUTION) IMPLANT
WATER STERILE IRR 500ML POUR (IV SOLUTION) ×1 IMPLANT

## 2022-09-11 NOTE — Plan of Care (Addendum)
Goals of care consult request noted.  Patient has just been returned to her room from PACU after undergoing IMN of left hip.  Will reattempt visit tomorrow.

## 2022-09-11 NOTE — Transfer of Care (Signed)
Immediate Anesthesia Transfer of Care Note  Patient: Kathleen Walls  Procedure(s) Performed: INTRAMEDULLARY (IM) NAIL INTERTROCHANTERIC (Left: Hip)  Patient Location: PACU  Anesthesia Type:General  Level of Consciousness: sedated  Airway & Oxygen Therapy: Patient Spontanous Breathing  Post-op Assessment: Report given to RN and Post -op Vital signs reviewed and stable  Post vital signs: Reviewed and stable  Last Vitals:  Vitals Value Taken Time  BP 109/50 09/11/22 1405  Temp    Pulse 73 09/11/22 1409  Resp 15 09/11/22 1409  SpO2 90 % 09/11/22 1409  Vitals shown include unvalidated device data.  Last Pain:  Vitals:   09/11/22 1118  TempSrc: Temporal         Complications: No notable events documented.

## 2022-09-11 NOTE — Anesthesia Procedure Notes (Signed)
Procedure Name: Intubation Date/Time: 09/11/2022 12:32 PM  Performed by: Lily Peer, Marae Cottrell, CRNAPre-anesthesia Checklist: Patient identified, Emergency Drugs available, Suction available and Patient being monitored Patient Re-evaluated:Patient Re-evaluated prior to induction Oxygen Delivery Method: Circle system utilized Preoxygenation: Pre-oxygenation with 100% oxygen Induction Type: IV induction Ventilation: Mask ventilation without difficulty Laryngoscope Size: McGraph and 3 Tube type: Oral Tube size: 6.5 mm Number of attempts: 1 Airway Equipment and Method: Stylet and Oral airway Placement Confirmation: ETT inserted through vocal cords under direct vision, positive ETCO2 and breath sounds checked- equal and bilateral Secured at: 20 cm Tube secured with: Tape Dental Injury: Teeth and Oropharynx as per pre-operative assessment

## 2022-09-11 NOTE — H&P (Signed)
THE PATIENT WAS SEEN PRIOR TO SURGERY TODAY.  HISTORY, ALLERGIES, HOME MEDICATIONS AND OPERATIVE PROCEDURE WERE REVIEWED. RISKS AND BENEFITS OF SURGERY DISCUSSED WITH PATIENT AGAIN.  NO CHANGES FROM INITIAL HISTORY AND PHYSICAL NOTED.    

## 2022-09-11 NOTE — Progress Notes (Signed)
Patient is disoriented x4. Began pulling at lines and removed surgical dressing. Son stated that he had tried to feed her but that she would bite down on anything he tried to place into her mouth. I instructed son to not force feed due to aspiration risk. Son stated to not give her po meds due to patient being combative and not following commands. Son stated that she is usually like this after anesthesia but is better after 24 hours. Gave prn haldol to calm patient down. Cannot swallow at this time due to confusion and combativeness. Son will remain at bedside with patient.

## 2022-09-11 NOTE — Progress Notes (Signed)
Initial Nutrition Assessment  DOCUMENTATION CODES:   Underweight, Severe malnutrition in context of chronic illness  INTERVENTION:   -MVI with minerals daily -Mighty Shake TID with meals, each supplement provides 220 kcals and 6 grams protein -Continue with regular diet  NUTRITION DIAGNOSIS:   Severe Malnutrition related to chronic illness ([place]) as evidenced by moderate fat depletion, severe fat depletion, moderate muscle depletion, severe muscle depletion.  GOAL:   Patient will meet greater than or equal to 90% of their needs  MONITOR:   PO intake, Supplement acceptance, Diet advancement  REASON FOR ASSESSMENT:   Consult Assessment of nutrition requirement/status, Hip fracture protocol  ASSESSMENT:   Pt with medical history significant of dementia, hypertension, hyperlipidemia, prediabetes, CAD with stent placement, stroke/TIA, GERD, depression with anxiety, kidney stone, breast cancer, diverticulitis, CREST syndrome, who presents with fall and left hip pain.  Pt admitted closed lt hip fracture.   Reviewed I/O's: -314 ml x 24 hours  UOP: 650 ml x 24 hours   Per orthopedics notes, plan for IMN today.   Spoke with pt family at bedside. Pt with dementia and unable to provide history. Per husband, pt usually has a family member to help her at meal times and usually consumes 50-75% of meals. She resides at the [facility] [place]. Per family members, pt sometimes does not eat well if she does not like the food (ex fish sticks on Fridays). Pt also receives a Mighty Shake at each meal, which she consumes.   Per husband, pt has always been petite. The most she has ever weighed is around 115#. Pt dropped down to 70# when she first transferred to the facility over the summer, but has regained to around 85#. Reviewed wt hx; pt has experienced a 7.9% wt loss over the past 9 months, which is not significant for time frame.   Discussed importance of good meal and supplement intake  to promote healing.   Case also discussed with MD.   Medications reviewed and include ferrous sulfate, remeron, senokot, 0.45% sodium chloride infusion @ 75 ml/hr.   Labs reviewed: CBGS: 109.   NUTRITION - FOCUSED PHYSICAL EXAM:  Flowsheet Row Most Recent Value  Orbital Region Severe depletion  Upper Arm Region Severe depletion  Thoracic and Lumbar Region Moderate depletion  Buccal Region Moderate depletion  Temple Region Severe depletion  Clavicle Bone Region Severe depletion  Clavicle and Acromion Bone Region Severe depletion  Scapular Bone Region Severe depletion  Dorsal Hand Moderate depletion  Patellar Region Severe depletion  Anterior Thigh Region Severe depletion  Posterior Calf Region Severe depletion  Edema (RD Assessment) None  Hair Reviewed  Eyes Reviewed  Mouth Reviewed  Skin Reviewed  Nails Reviewed       Diet Order:   Diet Order             Diet regular Room service appropriate? Yes; Fluid consistency: Thin  Diet effective now                   EDUCATION NEEDS:   Education needs have been addressed  Skin:  Skin Assessment: Skin Integrity Issues: Skin Integrity Issues:: Incisions Incisions: closed lt hip  Last BM:  Unknown  Height:   Ht Readings from Last 1 Encounters:  09/11/22 '5\' 3"'$  (1.6 m)    Weight:   Wt Readings from Last 1 Encounters:  09/11/22 42.1 kg    Ideal Body Weight:  52.3 kg  BMI:  Body mass index is 16.44 kg/m.  Estimated  Nutritional Needs:   Kcal:  1300-1500  Protein:  60-75 grams  Fluid:  > 1.3 L    Loistine Chance, RD, LDN, [place] Registered Dietitian II Certified Diabetes Care and Education Specialist Please refer to [facility] for RD and/or RD on-call/weekend/after hours pager

## 2022-09-11 NOTE — Anesthesia Preprocedure Evaluation (Addendum)
Anesthesia Evaluation  Patient identified by MRN, date of birth, ID band Patient awake    Reviewed: Allergy & Precautions, NPO status , Patient's Chart, lab work & pertinent test results  History of Anesthesia Complications (+) history of anesthetic complications (post op cognitive dysfunction)  Airway Mallampati: IV  TM Distance: >3 FB Neck ROM: full  Mouth opening: Limited Mouth Opening  Dental  (+) Poor Dentition   Pulmonary former smoker   Pulmonary exam normal        Cardiovascular hypertension, + CAD and + Cardiac Stents  Normal cardiovascular exam     Neuro/Psych  Headaches PSYCHIATRIC DISORDERS Anxiety Depression   Dementia TIA Neuromuscular disease CVA    GI/Hepatic Neg liver ROS,GERD  Medicated,,  Endo/Other  negative endocrine ROS    Renal/GU      Musculoskeletal   Abdominal   Peds  Hematology  (+) Blood dyscrasia, anemia   Anesthesia Other Findings Past Medical History: CREST syndrome  No date: Arthritis 2009: Breast cancer ([place])     Comment:   2009 breast cancer stage IIA T2 N0, ER positive, HER-2               negative, 2.5 cm primary tumor, poorly differentiated,               status post wide local excision, one sentinel lymph node               negative, high-risk Oncotype score 48 No date: Cataracts, both eyes No date: Colitis No date: Complication of anesthesia     Comment:  confusion No date: Coronary artery disease No date: Detached vitreous humor No date: Diverticulitis No date: Female bladder prolapse No date: Gastritis No date: GERD (gastroesophageal reflux disease) 06/19/2013: Heart valve problem     Comment:  Leaking No date: Hemorrhoid No date: Hemorrhoids No date: History of kidney stones No date: Hyperlipemia No date: 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[facility])     Comment:  ERPR POSITIVE 2.5CM GRADE 3, STAGE 2 No date: Kidney cysts     Comment:  Left No date: Kidney  stones 2009: Lipoma of shoulder     Comment:  left No date: Migraine No date: Osteoarthritis No date: Osteopenia No date: Stented coronary artery No date: Stroke [facility])  Past Surgical History: No date: ABDOMINAL HYSTERECTOMY 2007: benign breast biopsy 2007: BREAST BIOPSY; Left     Comment:  positive No date: BREAST CYST ASPIRATION; Right     Comment:  neg 2009: BREAST LUMPECTOMY; Left     Comment:   2009 breast cancer stage IIA T2 N0, ER positive, HER-2               negative, 2.5 cm primary tumor, poorly differentiated,               status post wide local excision, one sentinel lymph node               negative, high-risk Oncotype score 48 No date: BREAST SURGERY 06/22/2015: CARDIAC CATHETERIZATION; N/A     Comment:  Procedure: Left Heart Cath and Coronary Angiography;                Surgeon: Teodoro Spray, MD;  Location: [place] CV               LAB;  Service: Cardiovascular;  Laterality: N/A; No date: CARDIAC CATHETERIZATION No date: CATARACT EXTRACTION No date: COLONOSCOPY No date: CORONARY ANGIOPLASTY 12/26/2017: ENDARTERECTOMY; Right  Comment:  Procedure: ENDARTERECTOMY CAROTID;  Surgeon: Katha Cabal, MD;  Location: [facility] ORS;  Service: Vascular;                Laterality: Right; No date: EYE SURGERY No date: FOOT FRACTURE SURGERY; Left No date: PARTIAL HYSTERECTOMY No date: UPPER GASTROINTESTINAL ENDOSCOPY  BMI    Body Mass Index: 16.44 kg/m      Reproductive/Obstetrics negative OB ROS                             Anesthesia Physical Anesthesia Plan  ASA: 3  Anesthesia Plan: General ETT   Post-op Pain Management: Ofirmev IV (intra-op)*   Induction:   PONV Risk Score and Plan: Ondansetron, Dexamethasone, Midazolam and Treatment may vary due to age or medical condition  Airway Management Planned:   Additional Equipment:   Intra-op Plan:   Post-operative Plan:   Informed Consent: I have reviewed  the patients History and Physical, chart, labs and discussed the procedure including the risks, benefits and alternatives for the proposed anesthesia with the patient or authorized representative who has indicated his/her understanding and acceptance.     Dental Advisory Given  Plan Discussed with: Anesthesiologist, CRNA and Surgeon  Anesthesia Plan Comments:         Anesthesia Quick Evaluation

## 2022-09-11 NOTE — Anesthesia Postprocedure Evaluation (Signed)
Anesthesia Post Note  Patient: Kathleen Walls  Procedure(s) Performed: INTRAMEDULLARY (IM) NAIL INTERTROCHANTERIC (Left: Hip)  Patient location during evaluation: PACU Anesthesia Type: General Level of consciousness: awake and alert Pain management: pain level controlled Vital Signs Assessment: post-procedure vital signs reviewed and stable Respiratory status: spontaneous breathing, nonlabored ventilation, respiratory function stable and patient connected to nasal cannula oxygen Cardiovascular status: blood pressure returned to baseline and stable Postop Assessment: no apparent nausea or vomiting Anesthetic complications: no   There were no known notable events for this encounter.   Last Vitals:  Vitals:   09/11/22 1406 09/11/22 1415  BP: (!) 109/50 (!) 89/49  Pulse: 75 69  Resp: 19 17  Temp: 37 C   SpO2: 91% 92%    Last Pain:  Vitals:   09/11/22 1118  TempSrc: Temporal                 Ilene Qua

## 2022-09-11 NOTE — Progress Notes (Addendum)
[facility] NAME: Kathleen Walls    MR#:  [id number]  DATE OF BIRTH:  02-19-1941  SUBJECTIVE:  patient has advanced dementia. History is obtained from patient's family at bedside. Apparently family at the [place] of [place]. Started screaming with pain brought to the emergency room found to have left hip fracture. Patient scheduled for surgery with Dr. Sabra Heck.   VITALS:  Blood pressure (!) 106/52, pulse 64, temperature 97.8 F (36.6 C), resp. rate 16, height '5\' 3"'$  (1.6 m), weight 42.1 kg, SpO2 96 %.  PHYSICAL EXAMINATION:  Limited due to dementia GENERAL:  82 y.o.-year-old patient with no acute distress.  LUNGS: Normal breath sounds bilaterally, no wheezing CARDIOVASCULAR: S1, S2 normal. No murmur   ABDOMEN: Soft, nontender, nondistended.  EXTREMITIES: No  edema b/l.    NEUROLOGIC:  patient is sleeping  LABORATORY PANEL:  CBC Recent Labs  Lab 09/11/22 0248  WBC 16.8*  HGB 9.4*  HCT 29.8*  PLT 239    Chemistries  Recent Labs  Lab 09/11/22 0248  NA 138  K 3.7  CL 102  CO2 27  GLUCOSE 115*  BUN 16  CREATININE 0.62  CALCIUM 8.5*   Cardiac Enzymes No results for input(s): "TROPONINI" in the last 168 hours. RADIOLOGY:  DG HIP UNILAT WITH PELVIS 2-3 VIEWS LEFT  Result Date: 09/11/2022 CLINICAL DATA:  Intramedullary nail EXAM: DG HIP (WITH OR WITHOUT PELVIS) 2-3V LEFT COMPARISON:  Yesterday FINDINGS: Multiple intraoperative images demonstrate placement of an intramedullary nail with proximal and distal locking screw across the previously described comminuted intertrochanteric left femur fracture. Improved alignment without acute hardware complication. IMPRESSION: Intraoperative imaging of left proximal femur fixation. Electronically Signed   By: Abigail Miyamoto M.D.   On: 09/11/2022 14:21   DG C-Arm 1-60 Min-No Report  Result Date: 09/11/2022 Fluoroscopy was utilized by the requesting physician.  No radiographic  interpretation.   CT Head Wo Contrast  Result Date: 09/10/2022 CLINICAL DATA:  82 year old female status post fall, found down. Hip fracture. EXAM: CT HEAD WITHOUT CONTRAST TECHNIQUE: Contiguous axial images were obtained from the base of the skull through the vertex without intravenous contrast. RADIATION DOSE REDUCTION: This exam was performed according to the departmental dose-optimization program which includes automated exposure control, adjustment of the mA and/or kV according to patient size and/or use of iterative reconstruction technique. COMPARISON:  Brain MRI 11/16/2017.  Head CT 04/23/2022. FINDINGS: Brain: Small chronic infarct at the right superior frontal gyrus is stable from last year, was acute in 2019. Stable cerebral volume. No midline shift, ventriculomegaly, mass effect, evidence of mass lesion, intracranial hemorrhage or evidence of cortically based acute infarction. Stable gray-white matter differentiation throughout the brain. Vascular: Calcified atherosclerosis at the skull base. No suspicious intracranial vascular hyperdensity. Skull: No acute osseous abnormality identified. Sinuses/Orbits: Visualized paranasal sinuses and mastoids are stable and well aerated. Other: No orbit or scalp soft tissue injury identified. IMPRESSION: 1. No acute intracranial abnormality or acute traumatic injury identified. 2. 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 Electronically Signed   By: Genevie Ann M.D.   On: 09/10/2022 10:05   DG Chest Port 1 View  Result Date: 09/10/2022 CLINICAL DATA:  Fall.  Hip fracture. EXAM: PORTABLE CHEST 1 VIEW COMPARISON:  Two-view chest x-ray 03/10/2022 FINDINGS: The heart size is normal. Atherosclerotic calcifications are present at the aortic arch. Lungs are clear. Calcification of the left breast is stable. The visualized soft tissues and bony thorax  are otherwise unremarkable. IMPRESSION: 1. No acute cardiopulmonary disease. 2. Aortic atherosclerosis.  Electronically Signed   By: San Morelle M.D.   On: 09/10/2022 09:52   DG Hip Unilat W or Wo Pelvis 2-3 Views Left  Result Date: 09/10/2022 CLINICAL DATA:  Fall. Patient found on floor last night. Left hip pain. EXAM: DG HIP (WITH OR WITHOUT PELVIS) 3V LEFT COMPARISON:  None Available. FINDINGS: Comminuted intratrochanteric fracture of the proximal left femur demonstrates increased varus angulation. Lesser trochanter is displaced. Visualized pelvis is intact. IMPRESSION: Comminuted intratrochanteric fracture of the proximal left femur with increased varus angulation. Electronically Signed   By: San Morelle M.D.   On: 09/10/2022 09:51    Assessment and Plan  Kathleen Walls is a 82 y.o. female with medical history significant of dementia, hypertension, hyperlipidemia, prediabetes, CAD with stent placement, stroke/TIA, GERD, depression with anxiety, kidney stone, breast cancer, diverticulitis, CREST syndrome, who presents with fall and left hip pain.  pt is from the [place] of [place].   Chest x-ray negative.   CT of head negative.  X-ray of left hip showed comminuted intratrochanteric fracture of the proximal left femur with increased varus angulation   Closed left hip fracture ([place]): X-ray showed comminuted intratrochanteric fracture of the proximal left femur with increased varus angulation. Patient seems to have severe pain now. No neurovascular compromise.  --Orthopedic surgeon, Dr. Sabra Heck was consulted-- patient is status post Intertrochanteric intramedullary nail  - Pain control: prn morphine, percocet and tyleno - When necessary Zofran for nausea - Robaxin for muscle spasm - Lidoderm patch for pain   Leukocytosis: WBC 22.8, no fever, no source of infection identified, likely due to stress-induced demargination. Patient does not have signs of infection. -Follow-up CBC 22K--16 k -Follow-up urinalysis--neg for UTI   Fall at home, initial encounter - PT/OT from am   Benign  hypertension -Amlodipine, metoprolol   Arteriosclerosis of coronary artery:  HLD (hyperlipidemia) -Zetia   CREST (calcinosis, Raynaud's phenomenon, esophageal dysfunction, sclerodactyly, telangiectasia) (HCC) -Plaquenil   History of CVA (cerebrovascular accident) -ASA and zetia   Dementia ([place]) -Continue home olanzapine 2.5 mg at bedtime -As needed Haldol for agitation   Elevated CK: CK level 362 -IV fluid: 500 cc normal saline, then 75 cc/h --due to hip fracture   Normocytic anemia: - 11.4--hip surgery--9.4   Depression with anxiety -Continue home medications: Remeron, Xanax, venlafaxine   Protein-calorie malnutrition, severe ([place]): Body weight 42.1 kg, BMI 16.44 F/u RD recs         Procedures: INTRAMEDULLARY (IM) NAIL INTERTROCHANTERIC left hip  Family communication : son, sister and husband at bedside Consults :orthopedic CODE STATUS: full code DVT Prophylaxis : Lovenox Level of care: Med-Surg Status is: Inpatient Remains inpatient appropriate because: status post hip fracture. Pending therapy eval    TOTAL TIME TAKING CARE OF THIS PATIENT: 35 minutes.  >50% time spent on counselling and coordination of care  Note: This dictation was prepared with Dragon dictation along with smaller phrase technology. Any transcriptional errors that result from this process are unintentional.  [name] M.D    [facility]   CC: Primary care physician; [facility]

## 2022-09-11 NOTE — Progress Notes (Addendum)
Patient is disoriented x4. Son at bedside stated that this was her baseline. Son refused SCDs stating that they made her more agitated. Placed mittens on hands due to patient pulling at clothes and purewick. Had husband to sign consents for surgery due to patient's cognitive status. Speech is incoherent. Performed an in/out cath per order to obtain UA-drained 400 mls from bladder. Saw that on day shift she had over 450 mls left in bladder on bladder scan but I did not see where an in/out cath was performed during that time. Will bladder scan again in the morning if patient has not urinated again. No additional needs voiced by family.

## 2022-09-11 NOTE — Op Note (Signed)
DATE OF SURGERY:  09/11/2022  TIME: 2:05 PM  PATIENT NAME:  Kathleen Walls  AGE: 82 y.o.  PRE-OPERATIVE DIAGNOSIS:  Left hip fracture, comminuted displaced intertrochanteric  POST-OPERATIVE DIAGNOSIS:  SAME  PROCEDURE:  INTRAMEDULLARY (IM) NAIL INTERTROCHANTERIC left hip  SURGEON:  Park Breed  ASST:  EBL: 25 cc  COMPLICATIONS: None  OPERATIVE IMPLANTS: Synthes trochanteric femoral nail 10 mm/under 30 degree with the with interlocking helical blade 85 mm and distal locking screw 32 mm.  PREOPERATIVE INDICATIONS:  [name] is a 82 y.o. year old who fell and suffered a hip fracture. She was brought into the ER and then admitted and optimized and then elected for surgical intervention.    The risks benefits and alternatives were discussed with the patient including but not limited to the risks of nonoperative treatment, versus surgical intervention including infection, bleeding, nerve injury, malunion, nonunion, hardware prominence, hardware failure, need for hardware removal, blood clots, cardiopulmonary complications, morbidity, mortality, among others, and they were willing to proceed.    OPERATIVE PROCEDURE:  The patient was brought to the operating room and placed in the supine position.  General endotracheal anesthesia was administered, with a foley. She was placed on the fracture table.  Closed reduction was performed under C-arm guidance. The length of the femur was also measured using fluoroscopy. Time out was then performed after sterile prep and drape. She received preoperative antibiotics.  Incision was made proximal to the greater trochanter. A guidewire was placed in the appropriate position. Confirmation was made on AP and lateral views. The above-named nail was opened. I opened the proximal femur with a reamer. I then placed the nail but it was extremely tight.  Therefore I put a long reaming rod and and I reamed a canal to 11 mm.  I was unable to CT 10 mm rod  without problem.  Once the nail was completely seated, I placed a guidepin into the femoral head into the center center position through a second incision.  I measured the length, and then reamed the lateral cortex and up into the head. I then placed the helical blade. Slight compression was applied. Anatomic fixation achieved. Bone quality was mediocre.  I then secured the proximal interlock.  The distal locking screw was then placed and after confirming the position of the fracture fragments and hardware I then removed the instruments, and took final C-arm pictures AP and lateral the entire length of the leg. Anatomic reconstruction was achieved, and the wounds were irrigated copiously and closed with Vicryl  followed by staples and dry sterile dressing. Sponge and needle count were correct.   The patient was awakened and returned to PACU in stable and satisfactory condition. There no complications and the patient tolerated the procedure well.  She will be partial weightbearing as tolerated, and will be on Lovenox  For DVT prophylaxis.     Park Breed, M.D.

## 2022-09-12 ENCOUNTER — Encounter: Payer: Self-pay | Admitting: Specialist

## 2022-09-12 DIAGNOSIS — S72002A Fracture of unspecified part of neck of left femur, initial encounter for closed fracture: Secondary | ICD-10-CM | POA: Diagnosis not present

## 2022-09-12 DIAGNOSIS — Z7189 Other specified counseling: Secondary | ICD-10-CM | POA: Diagnosis not present

## 2022-09-12 DIAGNOSIS — M341 CR(E)ST syndrome: Secondary | ICD-10-CM | POA: Diagnosis not present

## 2022-09-12 DIAGNOSIS — F01511 Vascular dementia, unspecified severity, with agitation: Secondary | ICD-10-CM | POA: Diagnosis not present

## 2022-09-12 DIAGNOSIS — R52 Pain, unspecified: Secondary | ICD-10-CM

## 2022-09-12 DIAGNOSIS — I10 Essential (primary) hypertension: Secondary | ICD-10-CM | POA: Diagnosis not present

## 2022-09-12 LAB — BASIC METABOLIC PANEL
Anion gap: 6 (ref 5–15)
BUN: 18 mg/dL (ref 8–23)
CO2: 26 mmol/L (ref 22–32)
Calcium: 8.3 mg/dL — ABNORMAL LOW (ref 8.9–10.3)
Chloride: 104 mmol/L (ref 98–111)
Creatinine, Ser: 0.77 mg/dL (ref 0.44–1.00)
GFR, Estimated: >60 mL/min (ref >60)
Glucose, Bld: 113 mg/dL — ABNORMAL HIGH (ref 70–99)
Potassium: 3.8 mmol/L (ref 3.5–5.1)
Sodium: 136 mmol/L (ref 135–145)

## 2022-09-12 LAB — CBC
HCT: 24.6 % — ABNORMAL LOW (ref 36.0–46.0)
Hemoglobin: 7.7 g/dL — ABNORMAL LOW (ref 12.0–15.0)
MCH: 25.6 pg — ABNORMAL LOW (ref 26.0–34.0)
MCHC: 31.3 g/dL (ref 30.0–36.0)
MCV: 81.7 fL (ref 80.0–100.0)
Platelets: 238 10*3/uL (ref 150–400)
RBC: 3.01 MIL/uL — ABNORMAL LOW (ref 3.87–5.11)
RDW: 15.9 % — ABNORMAL HIGH (ref 11.5–15.5)
WBC: 19.6 10*3/uL — ABNORMAL HIGH (ref 4.0–10.5)
nRBC: 0 % (ref 0.0–0.2)

## 2022-09-12 LAB — PREPARE RBC (CROSSMATCH)

## 2022-09-12 MED ORDER — HYDROCODONE-ACETAMINOPHEN 5-325 MG PO TABS
0.5000 | ORAL_TABLET | ORAL | Status: DC | PRN
  Administered 2022-09-13 – 2022-09-14 (×2): 1 via ORAL
  Filled 2022-09-12 (×2): qty 1

## 2022-09-12 MED ORDER — SODIUM CHLORIDE 0.9% IV SOLUTION: Freq: Once | INTRAVENOUS | Status: DC

## 2022-09-12 MED ORDER — SODIUM CHLORIDE 0.9 % IV SOLN: INTRAVENOUS | Status: AC

## 2022-09-12 NOTE — Progress Notes (Signed)
Subjective: 1 Day Post-Op Procedure(s) (LRB): INTRAMEDULLARY (IM) NAIL INTERTROCHANTERIC (Left) Patient is awake and alert although confused.  She is talking.  She is moving both lower extremities well.  Her sister is in attendance today.  Hemoglobin was low so she is given 1 unit of blood today.  We will limit her weightbearing to 50% and she may need to be bed to chair for 2 to 3 weeks.  Patient reports pain as mild.  Objective:   VITALS:   Vitals:   09/12/22 1607 09/12/22 1645  BP: (!) 123/33 125/63  Pulse: 92 89  Resp: 20 18  Temp: 98.2 F (36.8 C) 98.3 F (36.8 C)  SpO2: 92% 94%    Neurologically intact Incision: scant drainage  LABS Recent Labs    09/10/22 0937 09/10/22 2025 09/11/22 0248 09/12/22 0238  HGB 10.5* 9.9* 9.4* 7.7*  HCT 35.2*  --  29.8* 24.6*  WBC 22.8*  --  16.8* 19.6*  PLT 293  --  239 238    Recent Labs    09/10/22 0937 09/11/22 0248 09/12/22 0238  NA 137 138 136  K 4.2 3.7 3.8  BUN '20 16 18  '$ CREATININE 0.75 0.62 0.77  GLUCOSE 140* 115* 113*    Recent Labs    09/10/22 0937  INR 1.0     Assessment/Plan: 1 Day Post-Op Procedure(s) (LRB): INTRAMEDULLARY (IM) NAIL INTERTROCHANTERIC (Left)   Advance diet Up with therapy Discharge to SNF

## 2022-09-12 NOTE — Progress Notes (Signed)
Attempted several times to give patient water and perform mouth care. Action witnessed by son and husband. Patient was biting down on anything put inside of her mouth and became more agitated. Mouth is very dry but cannot perform oral care due to patient biting down on items and could possible choke/aspirate what she bites. Continues to be confused and agitated. Pulling at lines with incomprehensible, rambling speech.

## 2022-09-12 NOTE — Plan of Care (Signed)

## 2022-09-12 NOTE — Evaluation (Signed)
Physical Therapy Evaluation Patient Details Name: Kathleen Walls MRN: [id number] DOB: [birth year] Today's Date: 09/12/2022  History of Present Illness  presented to ER secondary fall with acute onset of L hip pain; admitted for management of comminuted, intertrochanteric fracture of proximal L femur, s/p IM nailing (09/11/22), PWB  Clinical Impression  Patient resting in bed upon arrival to room; husband at bedside, assisting with PO intake as patient tolerates.  Does requires extensive cuing/prompting, but opens mouth  to accept/allow food.  Patient oriented to self only; unaware of location, situation and date.  Generally restless/fidgety and with near constant verbalizations (largely non-sensical and garbled).  PAINAD suggests pain to L hip, 7/10; improved with rest and repositioning.   Currently requiring max/total assist +1-2 for bed mobility; cga/min assist for unsupported sitting balance.  Does requires constant cuing/redirection; constantly reaching/grabbing for linen/clothes.  Maintains eyes closed while upright, but verbally responsive and interactive with therapist despite position change. Would benefit from skilled PT to address above deficits and promote optimal return to PLOF.; recommend transition to STR upon discharge from acute hospitalization.      Recommendations for follow up therapy are one component of a multi-disciplinary discharge planning process, led by the attending physician.  Recommendations may be updated based on patient status, additional functional criteria and insurance authorization.  Follow Up Recommendations Skilled nursing-short term rehab (<3 hours/day) Can patient physically be transported by private vehicle: No    Assistance Recommended at Discharge Frequent or constant Supervision/Assistance  Patient can return home with the following  Two people to help with walking and/or transfers;Two people to help with bathing/dressing/bathroom    Equipment  Recommendations    Recommendations for Other Services       Functional Status Assessment       Precautions / Restrictions Precautions Precautions: Fall Restrictions Weight Bearing Restrictions: Yes LLE Weight Bearing: Partial weight bearing LLE Partial Weight Bearing Percentage or Pounds: 50      Mobility  Bed Mobility Overal bed mobility: Needs Assistance Bed Mobility: Sit to Supine, Supine to Sit     Supine to sit: Max assist Sit to supine: Max assist, Total assist, +2 for physical assistance   General bed mobility comments: unsupported sitting balance, cga/close sup-consistent cuing/assist for hands, as patient constantly reaching, grabbing for things around.  Does maintain eyes closed once upright, but verbally responsive and interactive otherwise)    Transfers                   General transfer comment: C    Ambulation/Gait               General Gait Details: unsafe/unable  Stairs            Wheelchair Mobility    Modified Rankin (Stroke Patients Only)       Balance Overall balance assessment: Needs assistance Sitting-balance support: No upper extremity supported, Feet supported Sitting balance-Leahy Scale: Fair                                       Pertinent Vitals/Pain Pain Assessment Breathing: occasional labored breathing, short period of hyperventilation Negative Vocalization: repeated troubled calling out, loud moaning/groaning, crying Facial Expression: facial grimacing Body Language: tense, distressed pacing, fidgeting Consolability: distracted or reassured by voice/touch PAINAD Score: 7    Home Living Family/patient expects to be discharged to:: Assisted living  Home Equipment: None Additional Comments: Per husband, resident of the [facility] since June, 2023    Prior Function Prior Level of Function : Needs assist             Mobility Comments: Ambulatory throughout  facility, holding wall rail during longer ambulation attempts (unable to comprehend/safely utilize RW); does endorse at least 4 falls since transition to facility (unknown mechanism) ADLs Comments: Staff assists with ADLs as needed; able to feed self once set up     Hand Dominance        Extremity/Trunk Assessment   Upper Extremity Assessment Upper Extremity Assessment: Generalized weakness    Lower Extremity Assessment Lower Extremity Assessment: Generalized weakness (L hip grossly 3-/5 throughout, limited by pain)       Communication      Cognition Arousal/Alertness: Awake/alert Behavior During Therapy: Restless Overall Cognitive Status: History of cognitive impairments - at baseline                                 General Comments: Oriented to self; unaware of date, location or situation and unable to redirect.  Generally restless/fidgety throughout session, intermittently maintains eyes closed (but verbally responds).  Speech largely garbled and illogical; unable to effectively answer questions, make needs known or participate with conversation.        General Comments      Exercises Other Exercises Other Exercises: Supine LE therex, 1x10, act assist/passive ROM: ankle pumps, heel slides, hip abduct/adduct.  Generally tremulous throughout all extremities; fair tolerance for ROM to L hip/knee, but does resist beyond approximately 40-50 degrees of flexion   Assessment/Plan    PT Assessment Patient needs continued PT services  PT Problem List Decreased strength;Decreased range of motion;Decreased activity tolerance;Decreased balance;Decreased mobility;Decreased coordination;Decreased cognition;Decreased knowledge of use of DME;Decreased safety awareness;Decreased knowledge of precautions;Pain       PT Treatment Interventions DME instruction;Gait training;Functional mobility training;Therapeutic activities;Patient/family education;Cognitive  remediation;Balance training;Therapeutic exercise    PT Goals (Current goals can be found in the Care Plan section)  Acute Rehab PT Goals PT Goal Formulation: Patient unable to participate in goal setting Time For Goal Achievement: 09/26/22 Potential to Achieve Goals: Fair    Frequency 7X/week (will monitor tolerance and further adjust frequency as needed)     Co-evaluation               AM-PAC PT "6 Clicks" Mobility  Outcome Measure Help needed turning from your back to your side while in a flat bed without using bedrails?: Total Help needed moving from lying on your back to sitting on the side of a flat bed without using bedrails?: Total Help needed moving to and from a bed to a chair (including a wheelchair)?: Total Help needed standing up from a chair using your arms (e.g., wheelchair or bedside chair)?: Total Help needed to walk in hospital room?: Total Help needed climbing 3-5 steps with a railing? : Total 6 Click Score: 6    End of Session   Activity Tolerance: Patient limited by pain (limited by cognition) Patient left: in bed;with call bell/phone within reach;with bed alarm set;with family/visitor present Nurse Communication: Mobility status PT Visit Diagnosis: Muscle weakness (generalized) (M62.81);Other abnormalities of gait and mobility (R26.89);Pain;Difficulty in walking, not elsewhere classified (R26.2) Pain - Right/Left: Left Pain - part of body: Hip    Time: 5397-6734 PT Time Calculation (min) (ACUTE ONLY): 25 min   Charges:  PT Evaluation $PT Eval Moderate Complexity: 1 Mod PT Treatments $Therapeutic Exercise: 8-22 mins       Anjuli Gemmill H. Owens Shark, PT, DPT, NCS 09/12/22, 10:31 AM [phone]

## 2022-09-12 NOTE — Consult Note (Addendum)
Consultation Note Date: 09/12/2022   Patient Name: Kathleen Walls  DOB: 12-29-1940  MRN: [id number]  Age / Sex: 82 y.o., female  PCP: [facility] Referring Physician: [name], MD  Reason for Consultation: Establishing goals of care  HPI/Patient Profile: Kathleen Walls is a 82 y.o. female with medical history significant of dementia, hypertension, hyperlipidemia, prediabetes, CAD with stent placement, stroke/TIA, GERD, depression with anxiety, kidney stone, breast cancer, diverticulitis, CREST syndrome, who presents with fall and left hip pain.  Postop IMN left hip.   Clinical Assessment and Goals of Care: Notes and labs reviewed. MAR reviewed. Patient is 1 day S/P IMN of left hip.  Into see patient.  Patient's husband and their pastor are at bedside.  Patient has mittens in place and appears restless, and is reaching for her operative site.  Patient's husband states they have been married for over 50 years, and patient has 2 children from a previous marriage.  He discusses that patient lives in a facility.  He states she enjoys staying busy and helping out around the facility in ways such as wiping off the tables.  He states she usually will hold the rails at times when she is walking.  Husband tells me she is not really able to converse. He tells me that patient remembers him because she sees him daily; but does not always remember her children as she does not see them daily.  He states she wears pull-ups, as sometimes she has urinary incontinence due to not being able to make the bathroom in time.  Patient's BMI is 82, and husband tells me at baseline patient has a good appetite but is always doing something where she is moving.  He states this morning she is only eating bites.    Discussed the postop period.  Discussed time for outcomes, and to see how she does.  PMT will continue to  follow.  SUMMARY OF RECOMMENDATIONS   Continue current care.    Vicodin 48m q 4 hours PRN in place: half a tab for moderate pain, 1 tab for severe pain.  Agree with Morphine 0.5-123mq 2 hours PRN for severe pain, if patient is unable to take pills.  Could consider scheduling Tramadol for pain for the first few days post op.  Agree with Robaxin PRN for muscle spasms.   Recommend looking for signs of pain including restlessness and reaching for her operative site, and treating patient's postop pain as needed.  Recommend premedicating for pain prior to working with therapy.   Discussed case and updates with attending team.   Prognosis:  Unable to determine     Primary Diagnoses: Present on Admission:  [place] ([facility] Benign hypertension  Arteriosclerosis of coronary artery  HLD (hyperlipidemia)  CREST (calcinosis, Raynaud's phenomenon, esophageal dysfunction, sclerodactyly, telangiectasia) (HCC)  Dementia (HCC)  Depression with anxiety  Elevated CK  Leukocytosis  Protein-calorie malnutrition, severe (HCC)  Normocytic anemia   I have reviewed the medical record, interviewed the patient and family,  and examined the patient. The following aspects are pertinent.  Past Medical History:  Diagnosis Date   Arthritis    Breast cancer [facility]) 2009    2009 breast cancer stage IIA T2 N0, ER positive, HER-2 negative, 2.5 cm primary tumor, poorly differentiated, status post wide local excision, one sentinel lymph node negative, high-risk Oncotype score 48   Cataracts, both eyes    Colitis    Complication of anesthesia    confusion   Coronary artery disease    Detached vitreous humor    Diverticulitis    Female bladder prolapse    Gastritis    GERD (gastroesophageal reflux disease)    Heart valve problem 06/19/2013   Leaking   Hemorrhoid    Hemorrhoids    History of kidney stones    Hyperlipemia    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[place])    ERPR POSITIVE 2.5CM  GRADE 3, STAGE 2   Kidney cysts    Left   Kidney stones    Lipoma of shoulder 2009   left   Migraine    Osteoarthritis    Osteopenia    Stented coronary artery    Stroke [facility])    Social History   Socioeconomic History   Marital status: Married    Spouse name: Not on file   Number of children: Not on file   Years of education: Not on file   Highest education level: Not on file  Occupational History   Not on file  Tobacco Use   Smoking status: Former    Types: Cigarettes    Quit date: 08/14/1993    Years since quitting: 29.0   Smokeless tobacco: Never  Vaping Use   Vaping Use: Never used  Substance and Sexual Activity   Alcohol use: Not Currently    Alcohol/week: 0.0 standard drinks of alcohol    Comment: rare   Drug use: No   Sexual activity: Yes    Birth control/protection: None, Post-menopausal  Other Topics Concern   Not on file  Social History Narrative   Not on file   Social Determinants of Health   Financial Resource Strain: Not on file  Food Insecurity: No Food Insecurity (09/10/2022)   Hunger Vital Sign    Worried About Running Out of Food in the Last Year: Never true    Ran Out of Food in the Last Year: Never true  Transportation Needs: No Transportation Needs (09/10/2022)   PRAPARE - Hydrologist (Medical): No    Lack of Transportation (Non-Medical): No  Physical Activity: Not on file  Stress: Not on file  Social Connections: Not on file   Family History  Problem Relation Age of Onset   Heart disease Mother    Diabetes Father    Heart disease Father    Diabetes Sister    Diabetes Brother    Cervical cancer Cousin    Breast cancer Cousin    Lung cancer Paternal Aunt    Prostate cancer Maternal Uncle    Leukemia Other    Hypertension Other    Scheduled Meds:  amLODipine  2.5 mg Oral Daily   enoxaparin (LOVENOX) injection  30 mg Subcutaneous Q24H   ezetimibe  10 mg Oral Daily   ferrous sulfate  325 mg Oral Q  breakfast   hydroxychloroquine  200 mg Oral Daily   memantine  5 mg Oral BID   metoprolol succinate  12.5 mg Oral Daily   mirtazapine  15 mg  Oral QHS   multivitamin with minerals  1 tablet Oral Daily   OLANZapine  2.5 mg Oral QHS   pantoprazole  40 mg Oral Daily   senna  1 tablet Oral BID   venlafaxine XR  75 mg Oral Daily   Continuous Infusions:  sodium chloride Stopped (09/11/22 1736)   sodium chloride 75 mL/hr at 09/12/22 1051    ceFAZolin (ANCEF) IV 2 g (09/12/22 0524)   PRN Meds:.acetaminophen, acetaminophen, alum & mag hydroxide-simeth, bisacodyl, haloperidol lactate, hydrALAZINE, HYDROcodone-acetaminophen, HYDROcodone-acetaminophen, magnesium hydroxide, menthol-cetylpyridinium **OR** phenol, methocarbamol, metoCLOPramide **OR** metoCLOPramide (REGLAN) injection, morphine injection, nitroGLYCERIN, ondansetron (ZOFRAN) IV, ondansetron **OR** ondansetron (ZOFRAN) IV, oxyCODONE-acetaminophen, senna-docusate, sodium phosphate, zolpidem Medications Prior to Admission:  Prior to Admission medications   Medication Sig Start Date End Date Taking? Authorizing Provider  acetaminophen (TYLENOL) 325 MG tablet Take 650 mg by mouth every 4 (four) hours as needed for mild pain.   Yes [provider]  amLODipine (NORVASC) 2.5 MG tablet Take 2.5 mg by mouth daily. 02/12/18 09/10/22 Yes [provider]  aspirin EC 81 MG tablet Take 81 mg by mouth daily.    Yes [provider]  ezetimibe (ZETIA) 10 MG tablet Take 10 mg by mouth daily.  03/01/15  Yes [provider]  haloperidol (HALDOL) 0.5 MG tablet Take 0.5 mg by mouth every 8 (eight) hours as needed for agitation. 02/01/22  Yes [provider]  hydroxychloroquine (PLAQUENIL) 200 MG tablet Take 200 mg by mouth daily. 04/22/19  Yes [provider]  memantine (NAMENDA) 5 MG tablet Take 5 mg by mouth 2 (two) times daily.   Yes [provider]  metoprolol succinate (TOPROL-XL) 25 MG 24 hr tablet  Take 12.5 mg by mouth daily. 09/06/15  Yes [provider]  mirtazapine (REMERON) 15 MG tablet Take 15 mg by mouth at bedtime. 04/18/22  Yes [provider]  OLANZapine (ZYPREXA) 2.5 MG tablet Take 1 tablet (2.5 mg total) by mouth at bedtime. 02/02/22 02/02/23 Yes [name], MD  omeprazole (PRILOSEC) 20 MG capsule Take 20 mg by mouth 2 (two) times daily before a meal.   Yes [provider]  Venlafaxine HCl 75 MG TB24 Take 75 mg by mouth daily. 01/14/22  Yes [provider]  ALPRAZolam Duanne Moron) 0.25 MG tablet TAKE ONE-HALF TABLET BY MOUTH AT BEDTIME Patient not taking: Reported on 09/10/2022 01/17/15   [provider]  Azelaic Acid 15 % cream Apply 1 application topically daily.  Patient not taking: Reported on 11/21/2021    [provider]  butalbital-acetaminophen-caffeine (FIORICET, ESGIC) 50-325-40 MG tablet Take 1-2 tablets by mouth daily as needed for headache. Patient not taking: Reported on 09/10/2022    [provider]  docusate sodium (COLACE) 100 MG capsule Take 100 mg by mouth daily as needed for mild constipation.  Patient not taking: Reported on 04/27/2022    [provider]  hydrocortisone 2.5 % cream Apply topically. Patient not taking: Reported on 11/21/2021    [provider]  melatonin 3 MG TABS tablet Take by mouth. Patient not taking: Reported on 04/27/2022    [provider]  nitroGLYCERIN (NITROSTAT) 0.4 MG SL tablet Place 0.4 mg under the tongue every 5 (five) minutes as needed for chest pain. Patient not taking: Reported on 04/27/2022    [provider]  sodium chloride (MURO 128) 5 % ophthalmic solution Place 1 drop into the left eye at bedtime. Patient not taking: Reported on 04/27/2022    [provider]  venlafaxine XR (EFFEXOR-XR) 37.5 MG 24 hr capsule Take 37.5 mg by mouth daily. Patient not taking: Reported on 09/10/2022 04/05/19   [provider]   Allergies   Allergen Reactions   Alendronate Sodium Other (See Comments)    Unknown   Atorvastatin Other (See Comments)    Muscle Pain   Azithromycin Hives   Ciprofloxacin Other (See Comments)    Headache   Colestipol Other (See Comments)    Unknown   Lidocaine Hives   Lovastatin Other (See Comments)    Headache   Metronidazole Other (See Comments)    Headache   Niacin Other (See Comments)    Unknown   Risedronate Sodium Other (See Comments)    Unknown   Rosuvastatin Other (See Comments)    Unknown   Simvastatin Other (See Comments)    Headache   Review of Systems  Unable to perform ROS   Physical Exam Pulmonary:     Effort: Pulmonary effort is normal.  Neurological:     Mental Status: She is alert.     Vital Signs: BP 137/65 (BP Location: Right Arm)   Pulse (!) 59   Temp 98.4 F (36.9 C)   Resp 18   Ht 5' 3"$  (1.6 m)   Wt 42.1 kg   SpO2 90%   BMI 16.44 kg/m  Pain Scale: PAINAD       SpO2: SpO2: 90 % O2 Device:SpO2: 90 % O2 Flow Rate: .   IO: Intake/output summary:  Intake/Output Summary (Last 24 hours) at 09/12/2022 1207 Last data filed at 09/12/2022 [id number] Gross per 24 hour  Intake 1637.59 ml  Output 125 ml  Net 1512.59 ml    LBM: Last BM Date :  (PTA) Baseline Weight: Weight: 42.1 kg Most recent weight: Weight: 42.1 kg      Signed by: Asencion Gowda, NP   Please contact Palliative Medicine Team phone at [phone] for questions and concerns.  For individual provider: See Shea Evans

## 2022-09-12 NOTE — Progress Notes (Signed)
Patient pulled off aquacel dressing. Replaced with a new one. Thought that maybe she was in pain since she was rubbing surgical site and agitated/restless. After pain med given, patient's behavior did not decrease. She pulled on IV causing a leak in IV. Started new IV and gave haldol for agitation. Patient's husband is anxious about patient's condition and constantly comes out of room about her behavior. Educated patient about her condition and how at night, her agitation could sometimes increase.

## 2022-09-12 NOTE — Progress Notes (Addendum)
[facility] [place] NAME: Kathleen Walls    MR#:  [id number]  DATE OF BIRTH:  Feb 08, 1941  SUBJECTIVE:  patient has advanced dementia. History is obtained from patient's family at bedside. Pt is from the [facility]. Started screaming with pain brought to the emergency room found to have left hip fracture.  POD 1 Pt has had mittens and very agitated attempting to pull them out. Husband at bedside. Took mittens off and patient was much calmer. Patient was able to drink or ensure.  VITALS:  Blood pressure 137/65, pulse (!) 59, temperature 98.4 F (36.9 C), resp. rate 18, height '5\' 3"'$  (1.6 m), weight 42.1 kg, SpO2 90 %.  PHYSICAL EXAMINATION:  Limited due to dementia GENERAL:  82 y.o.-year-old patient with no acute distress.  LUNGS: Normal breath sounds bilaterally, no wheezing CARDIOVASCULAR: S1, S2 normal. No murmur   ABDOMEN: Soft, nontender, nondistended.  EXTREMITIES: No  edema b/l.    NEUROLOGIC:  patient is awake and confused at baseline  LABORATORY PANEL:  CBC Recent Labs  Lab 09/12/22 0238  WBC 19.6*  HGB 7.7*  HCT 24.6*  PLT 238     Chemistries  Recent Labs  Lab 09/12/22 0238  NA 136  K 3.8  CL 104  CO2 26  GLUCOSE 113*  BUN 18  CREATININE 0.77  CALCIUM 8.3*    Cardiac Enzymes No results for input(s): "TROPONINI" in the last 168 hours. RADIOLOGY:  DG HIP UNILAT WITH PELVIS 2-3 VIEWS LEFT  Result Date: 09/11/2022 CLINICAL DATA:  Intramedullary nail EXAM: DG HIP (WITH OR WITHOUT PELVIS) 2-3V LEFT COMPARISON:  Yesterday FINDINGS: Multiple intraoperative images demonstrate placement of an intramedullary nail with proximal and distal locking screw across the previously described comminuted intertrochanteric left femur fracture. Improved alignment without acute hardware complication. IMPRESSION: Intraoperative imaging of left proximal femur fixation. Electronically Signed   By: Abigail Miyamoto M.D.   On: 09/11/2022  14:21   DG C-Arm 1-60 Min-No Report  Result Date: 09/11/2022 Fluoroscopy was utilized by the requesting physician.  No radiographic interpretation.    Assessment and Plan  MAKITA BLOW is a 82 y.o. female with medical history significant of dementia, hypertension, hyperlipidemia, prediabetes, CAD with stent placement, stroke/TIA, GERD, depression with anxiety, kidney stone, breast cancer, diverticulitis, CREST syndrome, who presents with fall and left hip pain.  pt is from the [facility] [place].   Chest x-ray negative.   CT of head negative.  X-ray of left hip showed comminuted intratrochanteric fracture of the proximal left femur with increased varus angulation   Closed left hip fracture ([place]): X-ray showed comminuted intratrochanteric fracture of the proximal left femur with increased varus angulation. Patient seems to have severe pain now. No neurovascular compromise.  --Orthopedic surgeon, Dr. Sabra Heck was consulted-- patient is status post Intertrochanteric intramedullary nail  - Pain control: prn morphine, percocet and tyleno - When necessary Zofran for nausea - Robaxin for muscle spasm - Lidoderm patch for pain  Normocytic anemia/Post-op blood loss --came in with hgb 11.4 10.5--9.4--7.7--1 unit BT today (d/w dr Sabra Heck)   Leukocytosis: WBC 22.8, no fever, no source of infection identified, likely due to stress-induced demargination. Patient does not have signs of infection. -Follow-up CBC 22K--16 k -Follow-up urinalysis--neg for UTI   Fall at home, initial encounter - PT/OT  recommends rehab   Benign hypertension -Amlodipine, metoprolol   Arteriosclerosis of coronary artery:  HLD (hyperlipidemia) -Zetia   CREST (calcinosis, Raynaud's phenomenon, esophageal  dysfunction, sclerodactyly, telangiectasia) (HCC) -Plaquenil   History of CVA (cerebrovascular accident) -ASA and zetia   Dementia ([place]) -Continue home olanzapine 2.5 mg at bedtime -As needed Haldol for  agitation   Elevated CK: CK level 362 -IV fluid: 500 cc normal saline, then 75 cc/h --due to hip fracture   Depression with anxiety -Continue home medications: Remeron, Xanax, venlafaxine   Protein-calorie malnutrition, severe ([place]): Body weight 42.1 kg, BMI 16.44 F/u RD recs Nutrition Problem: Severe Malnutrition Etiology: chronic illness ([place]) Signs/Symptoms: moderate fat depletion, severe fat depletion, moderate muscle depletion, severe muscle depletion Interventions: MVI, Hormel Shake   Procedures: INTRAMEDULLARY (IM) NAIL INTERTROCHANTERIC left hip  Family communication : husband at bedside Consults :orthopedic CODE STATUS: full code DVT Prophylaxis : Lovenox Level of care: Med-Surg Status is: Inpatient Remains inpatient appropriate because: status post hip fracture.  TOC for d/c planning   TOTAL TIME TAKING CARE OF THIS PATIENT: 35 minutes.  >50% time spent on counselling and coordination of care  Note: This dictation was prepared with Dragon dictation along with smaller phrase technology. Any transcriptional errors that result from this process are unintentional.  Kathleen Walls M.D    [facility]   CC: Primary care physician; [facility]

## 2022-09-12 NOTE — Progress Notes (Signed)
Blood consent signed by spouse and placed in Pt paper chart.

## 2022-09-13 DIAGNOSIS — F01518 Vascular dementia, unspecified severity, with other behavioral disturbance: Secondary | ICD-10-CM

## 2022-09-13 DIAGNOSIS — E43 Unspecified severe protein-calorie malnutrition: Secondary | ICD-10-CM | POA: Diagnosis not present

## 2022-09-13 DIAGNOSIS — S72002A Fracture of unspecified part of neck of left femur, initial encounter for closed fracture: Secondary | ICD-10-CM | POA: Diagnosis not present

## 2022-09-13 DIAGNOSIS — Z7189 Other specified counseling: Secondary | ICD-10-CM | POA: Diagnosis not present

## 2022-09-13 DIAGNOSIS — D649 Anemia, unspecified: Secondary | ICD-10-CM

## 2022-09-13 LAB — BASIC METABOLIC PANEL
Anion gap: 6 (ref 5–15)
BUN: 14 mg/dL (ref 8–23)
CO2: 24 mmol/L (ref 22–32)
Calcium: 8 mg/dL — ABNORMAL LOW (ref 8.9–10.3)
Chloride: 108 mmol/L (ref 98–111)
Creatinine, Ser: 0.59 mg/dL (ref 0.44–1.00)
GFR, Estimated: >60 mL/min (ref >60)
Glucose, Bld: 114 mg/dL — ABNORMAL HIGH (ref 70–99)
Potassium: 2.9 mmol/L — ABNORMAL LOW (ref 3.5–5.1)
Sodium: 138 mmol/L (ref 135–145)

## 2022-09-13 LAB — TYPE AND SCREEN
ABO/RH(D): A POS
Antibody Screen: NEGATIVE
Unit division: 0

## 2022-09-13 LAB — CBC
HCT: 29 % — ABNORMAL LOW (ref 36.0–46.0)
Hemoglobin: 9.3 g/dL — ABNORMAL LOW (ref 12.0–15.0)
MCH: 26.2 pg (ref 26.0–34.0)
MCHC: 32.1 g/dL (ref 30.0–36.0)
MCV: 81.7 fL (ref 80.0–100.0)
Platelets: 200 10*3/uL (ref 150–400)
RBC: 3.55 MIL/uL — ABNORMAL LOW (ref 3.87–5.11)
RDW: 15.7 % — ABNORMAL HIGH (ref 11.5–15.5)
WBC: 14.7 10*3/uL — ABNORMAL HIGH (ref 4.0–10.5)
nRBC: 0 % (ref 0.0–0.2)

## 2022-09-13 LAB — BPAM RBC
Blood Product Expiration Date: 202402212359
ISSUE DATE / TIME: 202401301407
Unit Type and Rh: 6200

## 2022-09-13 MED ORDER — POTASSIUM CHLORIDE 10 MEQ/100ML IV SOLN
10.0000 meq | INTRAVENOUS | Status: AC
  Administered 2022-09-13 (×4): 10 meq via INTRAVENOUS
  Filled 2022-09-13 (×3): qty 100

## 2022-09-13 NOTE — NC FL2 (Signed)
[place] LEVEL OF CARE FORM     IDENTIFICATION  Patient Name: Kathleen Walls Birthdate: Nov 04, 1940 Sex: female Admission Date (Current Location): 09/10/2022  [facility] and [place] Number:  Engineering geologist and Address:  [facility], [address] [place], [place] [address]      Provider Number: [id number]  Attending Physician Name and Address:  [name], MD  Relative Name and Phone Number:  Husband Esme Durkin [phone]    Current Level of Care: Hospital Recommended Level of Care: [place] Prior Approval Number:    Date Approved/Denied:   PASRR Number: Pending  Discharge Plan: SNF    Current Diagnoses: Patient Active Problem List   Diagnosis Date Noted   Closed left hip fracture, initial encounter ([place]) 09/10/2022   Depression with anxiety 09/10/2022   Elevated CK 09/10/2022   Leukocytosis 09/10/2022   Fall at home, initial encounter 09/10/2022   Protein-calorie malnutrition, severe ([place]) 09/10/2022   Normocytic anemia 09/10/2022   Dementia ([place]) 02/02/2022   Prediabetes 01/19/2022   Memory disorder 07/04/2021   Statin intolerance 12/03/2019   Bilateral carotid artery stenosis 07/29/2019   History of CVA (cerebrovascular accident) 07/29/2019   Pure hypercholesterolemia 04/04/2019   Calcinosis 03/27/2019   CREST (calcinosis, Raynaud's phenomenon, esophageal dysfunction, sclerodactyly, telangiectasia) ([place]) 03/27/2019   Encounter for long-term (current) use of high-risk medication 03/27/2019   Constipation, chronic 06/28/2018   Primary osteoarthritis of both hands 05/09/2018   Bilateral hand pain 01/25/2018   Raynaud's phenomenon without gangrene 01/25/2018   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   Carotid stenosis 12/26/2017   TIA (transient ischemic attack) 11/16/2017   Acute cerebrovascular accident (CVA) ([place]) 11/16/2017   Carcinoma of overlapping sites of left breast in female, estrogen receptor  positive ([place]) 11/17/2016   Symptomatic carotid artery stenosis without infarction 09/11/2016   H/O appendicitis 08/01/2016   Thyromegaly 12/22/2015   Chest pain 05/14/2015   Arteriosclerosis of coronary artery 07/22/2014   Disorder of mitral valve 01/17/2014   Absolute anemia 10/23/2013   Benign hypertension 10/23/2013   Dermatophytic onychia 10/23/2013   HLD (hyperlipidemia) 10/23/2013   Idiopathic peripheral neuropathy 10/23/2013    Orientation RESPIRATION BLADDER Height & Weight     Self  Normal External catheter, Incontinent Weight: 42.1 kg Height:  '5\' 3"'$  (160 cm)  BEHAVIORAL SYMPTOMS/MOOD NEUROLOGICAL BOWEL NUTRITION STATUS      Incontinent Diet (see DC Summary)  AMBULATORY STATUS COMMUNICATION OF NEEDS Skin   Extensive Assist Verbally Normal, Surgical wounds                       Personal Care Assistance Level of Assistance  Bathing, Feeding, Dressing Bathing Assistance: Maximum assistance Feeding assistance: Limited assistance Dressing Assistance: Maximum assistance     Functional Limitations Info  Sight, Hearing, Speech Sight Info: Adequate Hearing Info: Adequate Speech Info: Adequate    SPECIAL CARE FACTORS FREQUENCY  PT (By licensed PT), OT (By licensed OT)     PT Frequency: 5 times per week OT Frequency: 5 times per week            Contractures Contractures Info: Not present    Additional Factors Info  Code Status, Allergies, Psychotropic Code Status Info: full code Allergies Info: Alendronate Sodium, Atorvastatin, Azithromycin, Ciprofloxacin, Colestipol, Lidocaine, Lovastatin, Metronidazole, Niacin, Risedronate Sodium, Rosuvastatin, Simvastatin Psychotropic Info: ALPRAZolam (XANAX) 0.25 MG tablet,haloperidol (HALDOL) 0.5 MG tablet,memantine (NAMENDA) 5 MG tablet,OLANZapine (ZYPREXA) 2.5 MG tablet,venlafaxine XR (EFFEXOR-XR) 37.5 MG 24 hr capsule  Current Medications (09/13/2022):  This is the current hospital active medication  list Current Facility-Administered Medications  Medication Dose Route Frequency Provider Last Rate Last Admin   0.45 % sodium chloride infusion   Intravenous Continuous Earnestine Leys, MD   Paused at 09/12/22 1742   0.9 %  sodium chloride infusion (Manually program via Guardrails IV Fluids)   Intravenous Once [name], MD       acetaminophen (TYLENOL) tablet 325-650 mg  325-650 mg Oral Q6H PRN Earnestine Leys, MD       acetaminophen (TYLENOL) tablet 650 mg  650 mg Oral Q6H PRN Earnestine Leys, MD       alum & mag hydroxide-simeth (MAALOX/MYLANTA) 200-200-20 MG/5ML suspension 30 mL  30 mL Oral Q4H PRN Earnestine Leys, MD       amLODipine (NORVASC) tablet 2.5 mg  2.5 mg Oral Daily Earnestine Leys, MD   2.5 mg at 09/13/22 7616   bisacodyl (DULCOLAX) suppository 10 mg  10 mg Rectal Daily PRN Earnestine Leys, MD       enoxaparin (LOVENOX) injection 30 mg  30 mg Subcutaneous Q24H Earnestine Leys, MD   30 mg at 09/13/22 0737   ezetimibe (ZETIA) tablet 10 mg  10 mg Oral Daily Earnestine Leys, MD   10 mg at 09/13/22 1062   ferrous sulfate tablet 325 mg  325 mg Oral Q breakfast Earnestine Leys, MD   325 mg at 09/13/22 0820   haloperidol lactate (HALDOL) injection 1 mg  1 mg Intravenous Q6H PRN Earnestine Leys, MD   1 mg at 09/13/22 0415   hydrALAZINE (APRESOLINE) injection 5 mg  5 mg Intravenous Q2H PRN Earnestine Leys, MD       HYDROcodone-acetaminophen (NORCO/VICODIN) 5-325 MG per tablet 0.5-1 tablet  0.5-1 tablet Oral Q4H PRN Asencion Gowda, NP   1 tablet at 09/13/22 1116   hydroxychloroquine (PLAQUENIL) tablet 200 mg  200 mg Oral Daily Earnestine Leys, MD   200 mg at 09/13/22 6948   magnesium hydroxide (MILK OF MAGNESIA) suspension 30 mL  30 mL Oral Daily PRN Earnestine Leys, MD       memantine [facility]) tablet 5 mg  5 mg Oral BID Earnestine Leys, MD   5 mg at 09/13/22 0820   menthol-cetylpyridinium (CEPACOL) lozenge 3 mg  1 lozenge Oral PRN Earnestine Leys, MD       Or   phenol (CHLORASEPTIC) mouth spray 1  spray  1 spray Mouth/Throat PRN Earnestine Leys, MD       methocarbamol (ROBAXIN) tablet 500 mg  500 mg Oral Q8H PRN Earnestine Leys, MD       metoCLOPramide (REGLAN) tablet 5-10 mg  5-10 mg Oral Q8H PRN Earnestine Leys, MD       Or   metoCLOPramide (REGLAN) injection 5-10 mg  5-10 mg Intravenous Q8H PRN Earnestine Leys, MD       metoprolol succinate (TOPROL-XL) 24 hr tablet 12.5 mg  12.5 mg Oral Daily Earnestine Leys, MD   12.5 mg at 09/13/22 5462   mirtazapine (REMERON) tablet 15 mg  15 mg Oral Tora Duck, MD   15 mg at 09/12/22 2127   morphine (PF) 2 MG/ML injection 0.5-1 mg  0.5-1 mg Intravenous Q2H PRN Earnestine Leys, MD   0.5 mg at 09/12/22 1207   multivitamin with minerals tablet 1 tablet  1 tablet Oral Daily [name], MD   1 tablet at 09/13/22 0820   nitroGLYCERIN (NITROSTAT) SL tablet 0.4 mg  0.4 mg Sublingual Q5 min PRN Earnestine Leys,  MD       OLANZapine (ZYPREXA) tablet 2.5 mg  2.5 mg Oral QHS Earnestine Leys, MD   2.5 mg at 09/12/22 2127   ondansetron [facility]) injection 4 mg  4 mg Intravenous Q8H PRN Earnestine Leys, MD       ondansetron [facility]) tablet 4 mg  4 mg Oral Q6H PRN Earnestine Leys, MD       Or   ondansetron [facility]) injection 4 mg  4 mg Intravenous Q6H PRN Earnestine Leys, MD       pantoprazole (PROTONIX) EC tablet 40 mg  40 mg Oral Daily Earnestine Leys, MD   40 mg at 09/13/22 0820   senna (SENOKOT) tablet 8.6 mg  1 tablet Oral BID Earnestine Leys, MD   8.6 mg at 09/12/22 2127   senna-docusate (Senokot-S) tablet 1 tablet  1 tablet Oral QHS PRN Earnestine Leys, MD       sodium phosphate (FLEET) 7-19 GM/118ML enema 1 enema  1 enema Rectal Once PRN Earnestine Leys, MD       venlafaxine XR (EFFEXOR-XR) 24 hr capsule 75 mg  75 mg Oral Daily Earnestine Leys, MD   75 mg at 09/13/22 0844   zolpidem (AMBIEN) tablet 5 mg  5 mg Oral QHS PRN Earnestine Leys, MD   5 mg at 09/12/22 2134     Discharge Medications: Please see discharge summary for a list of discharge  medications.  Relevant Imaging Results:  Relevant Lab Results:   Additional Information SS# [id number]  Conception Oms, RN

## 2022-09-13 NOTE — TOC Progression Note (Signed)
Transition of Care [facility]) - Progression Note    Patient Details  Name: Kathleen Walls MRN: [id number] Date of Birth: 11/07/1940  Transition of Care [facility]) CM/SW [place], RN Phone Number: 09/13/2022, 3:28 PM  Clinical Narrative:     Uploaded the clinical documents to [place] MUST To obtain PASSR, sent the bedsearch       Expected Discharge Plan and Services                                               Social Determinants of Health (SDOH) Interventions SDOH Screenings   Food Insecurity: No Food Insecurity (09/10/2022)  Housing: Low Risk  (09/10/2022)  Transportation Needs: No Transportation Needs (09/10/2022)  Utilities: Not At Risk (09/10/2022)  Tobacco Use: Medium Risk (09/12/2022)    Readmission Risk Interventions     No data to display

## 2022-09-13 NOTE — Progress Notes (Addendum)
Kathleen Walls at [place] NAME: Kathleen Walls    MR#:  [id number]  DATE OF BIRTH:  13-Jun-1941  SUBJECTIVE:  patient has advanced dementia. History is obtained from patient's family at bedside. Pt is from the [place]. Started screaming with pain brought to the emergency room found to have left hip fracture.  POD 2 Pt out in the chair. Per husband eating some, appetite ok VITALS:  Blood pressure (!) 137/57, pulse (!) 106, temperature 99.1 F (37.3 C), resp. rate 17, height '5\' 3"'$  (1.6 m), weight 42.1 kg, SpO2 99 %.  PHYSICAL EXAMINATION:  Limited due to dementia GENERAL:  82 y.o.-year-old patient with no acute distress.  LUNGS: Normal breath sounds bilaterally CARDIOVASCULAR: S1, S2 normal. No murmur   ABDOMEN: Soft, nontender, nondistended.  EXTREMITIES: No  edema b/l.    NEUROLOGIC:  patient is awake and confused at baseline  LABORATORY PANEL:  CBC Recent Labs  Lab 09/13/22 0456  WBC 14.7*  HGB 9.3*  HCT 29.0*  PLT 200     Chemistries  Recent Labs  Lab 09/13/22 0456  NA 138  K 2.9*  CL 108  CO2 24  GLUCOSE 114*  BUN 14  CREATININE 0.59  CALCIUM 8.0*    Cardiac Enzymes No results for input(s): "TROPONINI" in the last 168 hours. RADIOLOGY:  DG HIP UNILAT WITH PELVIS 2-3 VIEWS LEFT  Result Date: 09/11/2022 CLINICAL DATA:  Intramedullary nail EXAM: DG HIP (WITH OR WITHOUT PELVIS) 2-3V LEFT COMPARISON:  Yesterday FINDINGS: Multiple intraoperative images demonstrate placement of an intramedullary nail with proximal and distal locking screw across the previously described comminuted intertrochanteric left femur fracture. Improved alignment without acute hardware complication. IMPRESSION: Intraoperative imaging of left proximal femur fixation. Electronically Signed   By: Kathleen Walls M.D.   On: 09/11/2022 14:21   DG C-Arm 1-60 Min-No Report  Result Date: 09/11/2022 Fluoroscopy was utilized by the requesting physician.   No radiographic interpretation.    Assessment and Plan  Kathleen Walls is a 82 y.o. female with medical history significant of dementia, hypertension, hyperlipidemia, prediabetes, CAD with stent placement, stroke/TIA, GERD, depression with anxiety, kidney stone, breast cancer, diverticulitis, CREST syndrome, who presents with fall and left hip pain.  pt is from the [place].   Chest x-ray negative.   CT of head negative.  X-ray of left hip showed comminuted intratrochanteric fracture of the proximal left femur with increased varus angulation   Closed left hip fracture ([place]): X-ray showed comminuted intratrochanteric fracture of the proximal left femur with increased varus angulation. Patient seems to have severe pain now. No neurovascular compromise.  --Orthopedic surgeon, Dr. Sabra Heck was consulted-- patient is status post Intertrochanteric intramedullary nail  - Pain control: prn morphine, percocet and tyleno - When necessary Zofran for nausea - Robaxin for muscle spasm - Lidoderm patch for pain  Normocytic anemia/Post-op blood loss --came in with hgb 11.4 10.5--9.4--7.7--1 unit BT today (d/w dr miller)--9.4   Leukocytosis: WBC 22.8, no fever, no source of infection identified, likely due to stress-induced demargination. Patient does not have signs of infection. -Follow-up CBC 22K--16 k--14K -Follow-up urinalysis--neg for UTI   Fall at home, initial encounter - PT/OT  recommends rehab   Benign hypertension -Amlodipine, metoprolol   Arteriosclerosis of coronary artery:  HLD (hyperlipidemia) -Zetia   CREST (calcinosis, Raynaud's phenomenon, esophageal dysfunction, sclerodactyly, telangiectasia) (HCC) -Plaquenil   History of CVA (cerebrovascular accident) -ASA and zetia   Dementia ([place]) -Continue home  olanzapine 2.5 mg at bedtime -As needed Haldol for agitation   Elevated CK: CK level 362 -received IVF --due to hip fracture   Depression with anxiety -Continue  home medications: Remeron, Xanax, venlafaxine   Protein-calorie malnutrition, severe ([place]): Body weight 42.1 kg, BMI 16.44 F/u RD recs Nutrition Problem: Severe Malnutrition Etiology: chronic illness ([place]) Signs/Symptoms: moderate fat depletion, severe fat depletion, moderate muscle depletion, severe muscle depletion Interventions: MVI, Hormel Shake   Procedures: INTRAMEDULLARY (IM) NAIL INTERTROCHANTERIC left hip  Family communication : husband at bedside Consults :orthopedic CODE STATUS: full code DVT Prophylaxis : Lovenox Level of care: Med-Surg Status is: Inpatient Remains inpatient appropriate because: status post hip fracture.  TOC for d/c planning   TOTAL TIME TAKING CARE OF THIS PATIENT: 35 minutes.  >50% time spent on counselling and coordination of care  Note: This dictation was prepared with Dragon dictation along with smaller phrase technology. Any transcriptional errors that result from this process are unintentional.  Kathleen Walls M.D    [facility]   CC: Primary care physician; [facility], [facility]

## 2022-09-13 NOTE — Plan of Care (Signed)

## 2022-09-13 NOTE — Progress Notes (Signed)
Subjective: 2 Days Post-Op Procedure(s) (LRB): INTRAMEDULLARY (IM) NAIL INTERTROCHANTERIC (Left)   Awake and OOB in chair.  Husband at bedside.  Dressing dry.  Hgb stable now. Moves leg with little pain.    Patient reports pain as mild.  Objective:   VITALS:   Vitals:   09/12/22 2346 09/13/22 0743  BP: (!) 104/93 (!) 137/57  Pulse: (!) 109 (!) 106  Resp: 18 17  Temp: (!) 97.5 F (36.4 C) 99.1 F (37.3 C)  SpO2: 94% 99%    Neurologically intact Incision: scant drainage  LABS Recent Labs    09/11/22 0248 09/12/22 0238 09/13/22 0456  HGB 9.4* 7.7* 9.3*  HCT 29.8* 24.6* 29.0*  WBC 16.8* 19.6* 14.7*  PLT 239 238 200    Recent Labs    09/11/22 0248 09/12/22 0238 09/13/22 0456  NA 138 136 138  K 3.7 3.8 2.9*  BUN '16 18 14  '$ CREATININE 0.62 0.77 0.59  GLUCOSE 115* 113* 114*    No results for input(s): "LABPT", "INR" in the last 72 hours.   Assessment/Plan: 2 Days Post-Op Procedure(s) (LRB): INTRAMEDULLARY (IM) NAIL INTERTROCHANTERIC (Left)   Advance diet Up with therapy Discharge to SNF  81 mg ASA bid on discharge for 6 weeks RTC 2 weeks from discharge  50% WB only for 3 weeks

## 2022-09-13 NOTE — TOC Progression Note (Signed)
Transition of Care [facility]) - Progression Note    Patient Details  Name: Kathleen Walls MRN: [id number] Date of Birth: June 16, 1941  Transition [facility]) CM/SW [place], RN Phone Number: 09/13/2022, 2:47 PM  Clinical Narrative:     Spoke with the patient's husband, He is agreeable to the patient to go to STR, bedsearch done       Expected Discharge Plan and Services                                               Social Determinants of Health (SDOH) Interventions SDOH Screenings   Food Insecurity: No Food Insecurity (09/10/2022)  Housing: Low Risk  (09/10/2022)  Transportation Needs: No Transportation Needs (09/10/2022)  Utilities: Not At Risk (09/10/2022)  Tobacco Use: Medium Risk (09/12/2022)    Readmission Risk Interventions     No data to display

## 2022-09-13 NOTE — Progress Notes (Signed)
Daily Progress Note   Patient Name: Kathleen Walls       Date: 09/13/2022 DOB: [birth year]  Age: 82 y.o. MRN#: [id number] Attending Physician: [name], MD Primary Care Physician: Orvis Brill, [facility] Admit Date: 09/10/2022  Reason for Consultation/Follow-up: Establishing goals of care  Subjective: Notes and labs reviewed. Orders and MAR reviewed.  In to see patient. She is restless and trying to remove mittens. Husband and their pastor are at bedside. Husband states her appetite has improved. He asks questions regarding rehab and care moving forward. Questions answered as able, and discussed TOC would speak with him further. He states PT/OT should be coming today to work with her, as they had came earlier this morning, but he asked them to wait as she was finally able to rest at the time of their visit. Discussed pain and symptom management.   In regards to [place], he states he has already had these discussions prior to her surgery, and he would like any and all care indicated at this time.    Length of Stay: 3  Current Medications: Scheduled Meds:  . sodium chloride   Intravenous Once  . amLODipine  2.5 mg Oral Daily  . enoxaparin (LOVENOX) injection  30 mg Subcutaneous Q24H  . ezetimibe  10 mg Oral Daily  . ferrous sulfate  325 mg Oral Q breakfast  . hydroxychloroquine  200 mg Oral Daily  . memantine  5 mg Oral BID  . metoprolol succinate  12.5 mg Oral Daily  . mirtazapine  15 mg Oral QHS  . multivitamin with minerals  1 tablet Oral Daily  . OLANZapine  2.5 mg Oral QHS  . pantoprazole  40 mg Oral Daily  . senna  1 tablet Oral BID  . venlafaxine XR  75 mg Oral Daily    Continuous Infusions: . sodium chloride Stopped (09/12/22 1742)  . potassium chloride 10 mEq (09/13/22  0826)    PRN Meds: acetaminophen, acetaminophen, alum & mag hydroxide-simeth, bisacodyl, haloperidol lactate, hydrALAZINE, HYDROcodone-acetaminophen, magnesium hydroxide, menthol-cetylpyridinium **OR** phenol, methocarbamol, metoCLOPramide **OR** metoCLOPramide (REGLAN) injection, morphine injection, nitroGLYCERIN, ondansetron (ZOFRAN) IV, ondansetron **OR** ondansetron (ZOFRAN) IV, senna-docusate, sodium phosphate, zolpidem  Physical Exam Pulmonary:     Effort: Pulmonary effort is normal.  Neurological:     Mental Status: She is alert.  Vital Signs: BP (!) 137/57 (BP Location: Right Arm)   Pulse (!) 106   Temp 99.1 F (37.3 C)   Resp 17   Ht '5\' 3"'$  (1.6 m)   Wt 42.1 kg   SpO2 99%   BMI 16.44 kg/m  SpO2: SpO2: 99 % O2 Device: O2 Device: Room Air O2 Flow Rate:    Intake/output summary:  Intake/Output Summary (Last 24 hours) at 09/13/2022 1118 Last data filed at 09/13/2022 0421 Gross per 24 hour  Intake 3132.71 ml  Output 400 ml  Net 2732.71 ml   LBM: Last BM Date :  (PTA) Baseline Weight: Weight: 42.1 kg Most recent weight: Weight: 42.1 kg     Patient Active Problem List   Diagnosis Date Noted  . Closed left hip fracture ([place]) 09/10/2022  . Depression with anxiety 09/10/2022  . Elevated CK 09/10/2022  . Leukocytosis 09/10/2022  . Fall at home, initial encounter 09/10/2022  . Protein-calorie malnutrition, severe ([place]) 09/10/2022  . Normocytic anemia 09/10/2022  . Dementia ([place]) 02/02/2022  . Prediabetes 01/19/2022  . Memory disorder 07/04/2021  . Statin intolerance 12/03/2019  . Bilateral carotid artery stenosis 07/29/2019  . History of CVA (cerebrovascular accident) 07/29/2019  . Pure hypercholesterolemia 04/04/2019  . Calcinosis 03/27/2019  . CREST (calcinosis, Raynaud's phenomenon, esophageal dysfunction, sclerodactyly, telangiectasia) ([place]) 03/27/2019  . Encounter for long-term (current) use of high-risk medication 03/27/2019  . Constipation,  chronic 06/28/2018  . Primary osteoarthritis of both hands 05/09/2018  . Bilateral hand pain 01/25/2018  . Raynaud's phenomenon without gangrene 01/25/2018  . 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 Carotid stenosis 12/26/2017  . TIA (transient ischemic attack) 11/16/2017  . Acute cerebrovascular accident (CVA) ([place]) 11/16/2017  . Carcinoma of overlapping sites of left breast in female, estrogen receptor positive ([place]) 11/17/2016  . Symptomatic carotid artery stenosis without infarction 09/11/2016  . H/O appendicitis 08/01/2016  . Thyromegaly 12/22/2015  . Chest pain 05/14/2015  . Arteriosclerosis of coronary artery 07/22/2014  . Disorder of mitral valve 01/17/2014  . Absolute anemia 10/23/2013  . Benign hypertension 10/23/2013  . Dermatophytic onychia 10/23/2013  . HLD (hyperlipidemia) 10/23/2013  . Idiopathic peripheral neuropathy 10/23/2013    Palliative Care Assessment & Plan   Recommendations/Plan: Recommend pre-medicating prior to PT/OT.  Would recommend assessing for signs of pain, and treating pain as needed.  Discussed with primary RN.   Code Status:    Code Status Orders  (From admission, onward)           Start     Ordered   09/10/22 1129  Full code  Continuous       Question:  By:  Answer:  Consent: discussion documented in EHR   09/10/22 1129           Code Status History     Date Active Date Inactive Code Status Order ID Comments User Context   12/26/2017 1609 12/27/2017 1809 Full Code [id number]  Katha Cabal, MD Inpatient   11/16/2017 1641 11/17/2017 1959 Full Code [id number]  Nicholes Mango, MD Inpatient   07/26/2016 2215 07/29/2016 1314 Full Code [id number]  Jeanie Cooks, MD ED   06/22/2015 0855 06/22/2015 1335 Full Code [id number]  Teodoro Spray, MD Inpatient   05/14/2015 0651 05/14/2015 2049 Full Code [id number]  Juluis Mire, MD Inpatient      Advance Directive Documentation    Flowsheet Row Most Recent Value  Type of Advance Directive Healthcare  Power of Attorney  Pre-existing out of facility DNR order (yellow form or  pink MOST form) --  "MOST" Form in Place? --       Prognosis:  Unable to determine   Care plan was discussed with RN  Thank you for allowing the Palliative Medicine Team to assist in the care of this patient.   Asencion Gowda, NP  Please contact Palliative Medicine Team phone at [phone] for questions and concerns.

## 2022-09-13 NOTE — Progress Notes (Signed)
Physical Therapy Treatment Patient Details Name: Kathleen Walls MRN: [id number] DOB: Dec 11, 1940 Today's Date: 09/13/2022   History of Present Illness presented to ER secondary fall with acute onset of L hip pain; admitted for management of comminuted, intertrochanteric fracture of proximal L femur, s/p IM nailing (09/11/22), PWB    PT Comments    Pt was long sitting in bed with eyes closed and BUE mitts applied. Extremely supportive and helpful spouse present. Although pt keeps eyes closed throughout most of session, pt was awake but confused. Oriented to self. Occasionally follows commands but not consistently. Pt is fidgeting throughout. Issued posey activity belt to help with fidgeting.  Pt required max assist to achieve EOB sitting. Sat EOB with varied amount of assistance. She performed several exercises with assistance at EOB but required constant encouragement and engagement.  Total assist to stand pivot to recliner from EOB. Poor abilities to wt bear or awareness of PWB (50%). Highly recommend SNF at DC if plan remains to maximize independence and safety with ADLs.    Recommendations for follow up therapy are one component of a multi-disciplinary discharge planning process, led by the attending physician.  Recommendations may be updated based on patient status, additional functional criteria and insurance authorization.  Follow Up Recommendations  Skilled nursing-short term rehab (<3 hours/day)     Assistance Recommended at Discharge Frequent or constant Supervision/Assistance  Patient can return home with the following A lot of help with walking and/or transfers;A lot of help with bathing/dressing/bathroom;Assistance with cooking/housework;Assistance with feeding;Direct supervision/assist for medications management;Direct supervision/assist for financial management;Assist for transportation;Help with stairs or ramp for entrance   Equipment Recommendations  Other (comment) (defer to  next level of care)       Precautions / Restrictions Precautions Precautions: Fall Restrictions Weight Bearing Restrictions: Yes LLE Weight Bearing: Partial weight bearing LLE Partial Weight Bearing Percentage or Pounds: 50     Mobility  Bed Mobility Overal bed mobility: Needs Assistance Bed Mobility: Supine to Sit  Supine to sit: Max assist, HOB elevated  General bed mobility comments: Increased time + max assist to safely achieve EOB sitting    Transfers Overall transfer level: Needs assistance Equipment used: Rolling walker (2 wheels) Transfers: Sit to/from Stand, Bed to chair/wheelchair/BSC Sit to Stand: Max assist, From elevated surface Stand pivot transfers: Total assist  General transfer comment: Total assist to safely achieve short sit in recliner, max assist to stand. unaware of PWB. to pivot to recliner, pt's feet are barely touch floor. pt very anxious duing transfer. issued posey/activity belt for fidgeting    Ambulation/Gait  General Gait Details: Unsafe to attempt at this time     Balance Overall balance assessment: Needs assistance Sitting-balance support: No upper extremity supported, Feet supported Sitting balance-Leahy Scale: Fair Sitting balance - Comments: Varied amount of assistance required.   Standing balance support: Bilateral upper extremity supported Standing balance-Leahy Scale: Poor Standing balance comment: Extremely high fall risk       Cognition Arousal/Alertness: Awake/alert Behavior During Therapy: Restless, Anxious Overall Cognitive Status: History of cognitive impairments - at baseline      General Comments: Pt is awake however very minimal periods of eyes being open. Does respond to her name but per supportive spouse, has baseline dementia that has been worsnening since last june               Pertinent Vitals/Pain Pain Assessment Pain Assessment: PAINAD Breathing: normal Negative Vocalization: occasional moan/groan, low  speech, negative/disapproving quality Facial Expression: facial  grimacing Body Language: rigid, fists clenched, knees up, pushing/pulling away, strikes out Consolability: unable to console, distract or reassure PAINAD Score: 7 Pain Intervention(s): Limited activity within patient's tolerance, Monitored during session, Premedicated before session, Repositioned     PT Goals (current goals can now be found in the care plan section) Acute Rehab PT Goals Patient Stated Goal: none stated Progress towards PT goals: Not progressing toward goals - comment (cognition limiting)    Frequency    7X/week      PT Plan Current plan remains appropriate       AM-PAC PT "6 Clicks" Mobility   Outcome Measure  Help needed turning from your back to your side while in a flat bed without using bedrails?: A Lot Help needed moving from lying on your back to sitting on the side of a flat bed without using bedrails?: Total Help needed moving to and from a bed to a chair (including a wheelchair)?: Total Help needed standing up from a chair using your arms (e.g., wheelchair or bedside chair)?: Total Help needed to walk in hospital room?: Total Help needed climbing 3-5 steps with a railing? : Total 6 Click Score: 7    End of Session   Activity Tolerance: Other (comment);Patient limited by pain (limited by dementia) Patient left: in chair;with call bell/phone within reach;with chair alarm set Nurse Communication: Mobility status PT Visit Diagnosis: Muscle weakness (generalized) (M62.81);Other abnormalities of gait and mobility (R26.89);Pain;Difficulty in walking, not elsewhere classified (R26.2) Pain - Right/Left: Left Pain - part of body: Hip     Time: 2800-3491 PT Time Calculation (min) (ACUTE ONLY): 33 min  Charges:  $Therapeutic Activity: 23-37 mins                     Julaine Fusi PTA 09/13/22, 1:36 PM

## 2022-09-13 NOTE — Care Management Important Message (Signed)
Important Message  Patient Details  Name: Kathleen Walls MRN: [id number] Date of Birth: 23-Sep-1940   Medicare Important Message Given:  Yes     Dannette Barbara 09/13/2022, 11:03 AM

## 2022-09-13 NOTE — Progress Notes (Signed)
PT Cancellation Note  Patient Details Name: Kathleen Walls MRN: [id number] DOB: 17-Jun-1941   Cancelled Treatment:     PT attempt. Family member at bedside. Pt was long sitting in bed, eyes closed but fidgeting with BUE mitts donned. Family member requested Pryor Curia return at a later time this date." This is the first time she has been somewhat comfortable. Can we let her rest right now? As requested, Pryor Curia will return at a later time.    Willette Pa 09/13/2022, 10:18 AM

## 2022-09-13 NOTE — TOC PASRR Note (Signed)
The above named patient is recommended to go to Short Term Rehab for strengthening and gait training for balance.  It is expected that the Short Term Rehab stay will be less than 30 days.  The patient is expected to return home after Rehab.  ?

## 2022-09-14 DIAGNOSIS — E43 Unspecified severe protein-calorie malnutrition: Secondary | ICD-10-CM | POA: Diagnosis not present

## 2022-09-14 DIAGNOSIS — S72002A Fracture of unspecified part of neck of left femur, initial encounter for closed fracture: Secondary | ICD-10-CM | POA: Diagnosis not present

## 2022-09-14 DIAGNOSIS — Z7189 Other specified counseling: Secondary | ICD-10-CM | POA: Diagnosis not present

## 2022-09-14 DIAGNOSIS — D649 Anemia, unspecified: Secondary | ICD-10-CM | POA: Diagnosis not present

## 2022-09-14 DIAGNOSIS — F01518 Vascular dementia, unspecified severity, with other behavioral disturbance: Secondary | ICD-10-CM | POA: Diagnosis not present

## 2022-09-14 LAB — CBC
HCT: 29.4 % — ABNORMAL LOW (ref 36.0–46.0)
Hemoglobin: 9.3 g/dL — ABNORMAL LOW (ref 12.0–15.0)
MCH: 25.8 pg — ABNORMAL LOW (ref 26.0–34.0)
MCHC: 31.6 g/dL (ref 30.0–36.0)
MCV: 81.7 fL (ref 80.0–100.0)
Platelets: 238 10*3/uL (ref 150–400)
RBC: 3.6 MIL/uL — ABNORMAL LOW (ref 3.87–5.11)
RDW: 15.9 % — ABNORMAL HIGH (ref 11.5–15.5)
WBC: 13.7 10*3/uL — ABNORMAL HIGH (ref 4.0–10.5)
nRBC: 0 % (ref 0.0–0.2)

## 2022-09-14 LAB — BASIC METABOLIC PANEL
Anion gap: 6 (ref 5–15)
BUN: 18 mg/dL (ref 8–23)
CO2: 27 mmol/L (ref 22–32)
Calcium: 8.1 mg/dL — ABNORMAL LOW (ref 8.9–10.3)
Chloride: 107 mmol/L (ref 98–111)
Creatinine, Ser: 0.58 mg/dL (ref 0.44–1.00)
GFR, Estimated: >60 mL/min (ref >60)
Glucose, Bld: 126 mg/dL — ABNORMAL HIGH (ref 70–99)
Potassium: 3.8 mmol/L (ref 3.5–5.1)
Sodium: 140 mmol/L (ref 135–145)

## 2022-09-14 NOTE — Progress Notes (Signed)
Subjective: 3 Days Post-Op Procedure(s) (LRB): INTRAMEDULLARY (IM) NAIL INTERTROCHANTERIC (Left) Patient is drowsy but arousable this morning.  Husband and friends are at the bedside.  Dressing has minimal drainage.  Social work is working on Warden/ranger bed.  Blood work stable.  Patient reports pain as mild.  Objective:   VITALS:   Vitals:   09/13/22 2337 09/14/22 0745  BP: (!) 99/56 (!) 166/78  Pulse: 100 (!) 109  Resp: 16 15  Temp: 98.2 F (36.8 C)   SpO2: 99% 100%    Neurologically intact Incision: scant drainage  LABS Recent Labs    09/12/22 0238 09/13/22 0456 09/14/22 0603  HGB 7.7* 9.3* 9.3*  HCT 24.6* 29.0* 29.4*  WBC 19.6* 14.7* 13.7*  PLT 238 200 238    Recent Labs    09/12/22 0238 09/13/22 0456 09/14/22 0603  NA 136 138 140  K 3.8 2.9* 3.8  BUN '18 14 18  '$ CREATININE 0.77 0.59 0.58  GLUCOSE 113* 114* 126*    No results for input(s): "LABPT", "INR" in the last 72 hours.   Assessment/Plan: 3 Days Post-Op Procedure(s) (LRB): INTRAMEDULLARY (IM) NAIL INTERTROCHANTERIC (Left)   Up with therapy Discharge to SNF  81 mg ASA twice daily for 6 weeks Partial weightbearing left leg 50% Return to clinic 2 weeks for exam and x-ray

## 2022-09-14 NOTE — TOC Progression Note (Signed)
Transition of Care [facility]) - Progression Note    Patient Details  Name: Kathleen Walls MRN: [id number] Date of Birth: 03-Dec-1940  [facility]) CM/SW [place], RN Phone Number: 09/14/2022, 3:09 PM  Clinical Narrative:   Spoke to the patient's husband he went to some facilities and toured  He stated that the patient has not awakened or eaten today He chose [facility]  for str I called and let [facility] know to hold the bed Ins pending      Expected Discharge Plan: Skilled Nursing Facility Barriers to Discharge: [facility] broker, [place] [facility]), SNF Pending bed offer  Expected Discharge Plan and Services       Living arrangements for the past 2 months: [place]                                       Social Determinants of Health (SDOH) Interventions SDOH Screenings   Food Insecurity: No Food Insecurity (09/10/2022)  Housing: Low Risk  (09/10/2022)  Transportation Needs: No Transportation Needs (09/10/2022)  Utilities: Not At Risk (09/10/2022)  Tobacco Use: Medium Risk (09/12/2022)    Readmission Risk Interventions     No data to display

## 2022-09-14 NOTE — Progress Notes (Signed)
Nutrition Follow-up  DOCUMENTATION CODES:   Underweight, Severe malnutrition in context of chronic illness  INTERVENTION:   -Continue MVI with minerals daily -Continue Mighty Shake TID with meals, each supplement provides 220 kcals and 6 grams protein -Continue with regular diet  NUTRITION DIAGNOSIS:   Severe Malnutrition related to chronic illness (demenita) as evidenced by moderate fat depletion, severe fat depletion, moderate muscle depletion, severe muscle depletion.  Ongoing  GOAL:   Patient will meet greater than or equal to 90% of their needs  Progressing   MONITOR:   PO intake, Supplement acceptance, Diet advancement  REASON FOR ASSESSMENT:   Consult Assessment of nutrition requirement/status, Hip fracture protocol  ASSESSMENT:   Pt with medical history significant of dementia, hypertension, hyperlipidemia, prediabetes, CAD with stent placement, stroke/TIA, GERD, depression with anxiety, kidney stone, breast cancer, diverticulitis, CREST syndrome, who presents with fall and left hip pain.  1/29- s/p PROCEDURE:  INTRAMEDULLARY (IM) NAIL INTERTROCHANTERIC left hip   Reviewed I/O's: +100 ml x 24 hours and +3.5 L since admission  UOP: 300 ml x 24 hours   Pt unavailable at time of visit.   Pt with fair appetite. Noted meal completions 0-100%. Pt family is very involved and ensures there is someone present at meals. Mighty Shakes have been ordered, as pt drinks these well at the facility PTA.   No new wt since last visit.   Palliative care following; plan to continue current care.   Rehab team recommending SNF. TOC working on placement.   Medications reviewed and include ferrous sulfate, remeron, and senokot.   Labs reviewed: CBGS: 109.   Diet Order:   Diet Order             Diet regular Room service appropriate? Yes; Fluid consistency: Thin  Diet effective now                   EDUCATION NEEDS:   Education needs have been addressed  Skin:   Skin Assessment: Skin Integrity Issues: Skin Integrity Issues:: Incisions Incisions: closed lt hip  Last BM:  Unknown  Height:   Ht Readings from Last 1 Encounters:  09/11/22 '5\' 3"'$  (1.6 m)    Weight:   Wt Readings from Last 1 Encounters:  09/11/22 42.1 kg    Ideal Body Weight:  52.3 kg  BMI:  Body mass index is 16.44 kg/m.  Estimated Nutritional Needs:   Kcal:  1300-1500  Protein:  60-75 grams  Fluid:  > 1.3 L    Loistine Chance, RD, LDN, [place] Registered Dietitian II Certified Diabetes Care and Education Specialist Please refer to [facility] for RD and/or RD on-call/weekend/after hours pager

## 2022-09-14 NOTE — Progress Notes (Addendum)
Kathleen Walls at [place] NAME: Kathleen Walls    MR#:  [id number]  DATE OF BIRTH:  February 19, 1941  SUBJECTIVE:  patient has advanced dementia. History is obtained from patient's family at bedside. Pt is from the [place]. Started screaming with pain brought to the emergency room found to have left hip fracture.  POD 3 Per husband eating some, appetite ok Remains confused at baseline VITALS:  Blood pressure (!) 166/78, pulse (!) 109, temperature 98.2 F (36.8 C), resp. rate 15, height '5\' 3"'$  (1.6 m), weight 42.1 kg, SpO2 100 %.  PHYSICAL EXAMINATION:  Limited due to dementia GENERAL:  82 y.o.-year-old patient with no acute distress. Thin cachectic LUNGS: Normal breath sounds bilaterally CARDIOVASCULAR: S1, S2 normal. No murmur   ABDOMEN: Soft, nontender, nondistended.  EXTREMITIES: No  edema b/l.    NEUROLOGIC:  patient is awake and confused at baseline  LABORATORY PANEL:  CBC Recent Labs  Lab 09/14/22 0603  WBC 13.7*  HGB 9.3*  HCT 29.4*  PLT 238     Chemistries  Recent Labs  Lab 09/14/22 0603  NA 140  K 3.8  CL 107  CO2 27  GLUCOSE 126*  BUN 18  CREATININE 0.58  CALCIUM 8.1*    Cardiac Enzymes No results for input(s): "TROPONINI" in the last 168 hours. RADIOLOGY:  No results found.  Assessment and Plan  Kathleen Walls is a 82 y.o. female with medical history significant of dementia, hypertension, hyperlipidemia, prediabetes, CAD with stent placement, stroke/TIA, GERD, depression with anxiety, kidney stone, breast cancer, diverticulitis, CREST syndrome, who presents with fall and left hip pain.  pt is from the [place].   Chest x-ray negative.   CT of head negative.  X-ray of left hip showed comminuted intratrochanteric fracture of the proximal left femur with increased varus angulation   Closed left hip fracture ([place]): X-ray showed comminuted intratrochanteric fracture of the proximal left femur with  increased varus angulation. Patient seems to have severe pain now. No neurovascular compromise.  --Orthopedic surgeon, Dr. Sabra Heck was consulted-- patient is status post Intertrochanteric intramedullary nail  - Pain control: prn morphine, percocet and tyleno - When necessary Zofran for nausea - Robaxin for muscle spasm - Lidoderm patch for pain  Normocytic anemia/Post-op blood loss --came in with hgb 11.4 10.5--9.4--7.7--1 unit BT today (d/w dr miller)--9.4   Leukocytosis: WBC 22.8, no fever, no source of infection identified, likely due to stress-induced demargination. Patient does not have signs of infection. -Follow-up CBC 22K--16 k--14K -Follow-up urinalysis--neg for UTI   Fall at home, initial encounter - PT/OT  recommends rehab   Benign hypertension -Amlodipine, metoprolol   Arteriosclerosis of coronary artery:  HLD (hyperlipidemia) -Zetia   CREST (calcinosis, Raynaud's phenomenon, esophageal dysfunction, sclerodactyly, telangiectasia) (HCC) -Plaquenil   History of CVA (cerebrovascular accident) -ASA and zetia   Dementia ([place]) -Continue home olanzapine 2.5 mg at bedtime -As needed Haldol for agitation   Elevated CK: CK level 362 -received IVF --due to hip fracture   Depression with anxiety -Continue home medications: Remeron, Xanax, venlafaxine   Protein-calorie malnutrition, severe ([place]): Body weight 42.1 kg, BMI 16.44 F/u RD recs Nutrition Problem: Severe Malnutrition Etiology: chronic illness (demenita) Signs/Symptoms: moderate fat depletion, severe fat depletion, moderate muscle depletion, severe muscle depletion Interventions: MVI, Hormel Shake   Procedures: INTRAMEDULLARY (IM) NAIL INTERTROCHANTERIC left hip  Family communication : husband at bedside Consults :orthopedic CODE STATUS: full code DVT Prophylaxis : Lovenox now and at  d/c asa 81 mg bid for 6 weeks Level of care: Med-Surg Status is: Inpatient Remains inpatient appropriate because: status  post hip fracture.  TOC for d/c planning  TOTAL TIME TAKING CARE OF THIS PATIENT: 35 minutes.  >50% time spent on counselling and coordination of care  Note: This dictation was prepared with Dragon dictation along with smaller phrase technology. Any transcriptional errors that result from this process are unintentional.  [name] M.D    [facility]   CC: Primary care physician; [facility]

## 2022-09-14 NOTE — TOC Progression Note (Signed)
Transition of Care [facility]) - Progression Note    Patient Details  Name: Kathleen Walls MRN: [id number] Date of Birth: Dec 25, 1940  Transition of Care [facility]) CM/SW [place], RN Phone Number: 09/14/2022, 11:51 AM  Clinical Narrative:   Spoke with the patient's husband Gwyndolyn Saxon and provided bed options to choose from, I encouraged him to review on Medicare .gov as well as go to the facilities and tour, he will research some and call me by end of day today with a choice, I explained I still need to get Ins approval as well    Expected Discharge Plan: Skilled Nursing Facility Barriers to Discharge: [facility], [place] [facility] [facility]), SNF Pending bed offer  Expected Discharge Plan and Services       Living arrangements for the past 2 months: [place]                                       Social Determinants of Health (SDOH) Interventions SDOH Screenings   Food Insecurity: No Food Insecurity (09/10/2022)  Housing: Low Risk  (09/10/2022)  Transportation Needs: No Transportation Needs (09/10/2022)  Utilities: Not At Risk (09/10/2022)  Tobacco Use: Medium Risk (09/12/2022)    Readmission Risk Interventions     No data to display

## 2022-09-14 NOTE — Progress Notes (Signed)
Daily Progress Note   Patient Name: Kathleen Walls       Date: 09/14/2022 DOB: 03-04-1941  Age: 82 y.o. MRN#: [id number] Attending Physician: [name], MD Primary Care Physician: Orvis Brill, [facility] Admit Date: 09/10/2022  Reason for Consultation/Follow-up: Establishing goals of care  Subjective: Notes and labs reviewed.  Into see patient.  She is resting in bed at this time with her eyes closed.  Husband is at bedside, along with her pastor.  Husband is anxious as he states he has to decide on a rehab facility for his wife.  With discussion, and with the pastor's input from having visited patient's in the area in nursing facilities, there are a couple of facilities that husband intends to go visit today.  He confirms full code/full scope.  PMT will sign off at this time as goals are set.  Length of Stay: 4  Current Medications: Scheduled Meds:  . amLODipine  2.5 mg Oral Daily  . enoxaparin (LOVENOX) injection  30 mg Subcutaneous Q24H  . ezetimibe  10 mg Oral Daily  . ferrous sulfate  325 mg Oral Q breakfast  . hydroxychloroquine  200 mg Oral Daily  . memantine  5 mg Oral BID  . metoprolol succinate  12.5 mg Oral Daily  . mirtazapine  15 mg Oral QHS  . multivitamin with minerals  1 tablet Oral Daily  . OLANZapine  2.5 mg Oral QHS  . pantoprazole  40 mg Oral Daily  . senna  1 tablet Oral BID  . venlafaxine XR  75 mg Oral Daily    Continuous Infusions:   PRN Meds: acetaminophen, acetaminophen, alum & mag hydroxide-simeth, bisacodyl, haloperidol lactate, hydrALAZINE, HYDROcodone-acetaminophen, magnesium hydroxide, menthol-cetylpyridinium **OR** phenol, methocarbamol, morphine injection, nitroGLYCERIN, ondansetron (ZOFRAN) IV, ondansetron **OR** ondansetron (ZOFRAN) IV,  senna-docusate, sodium phosphate, zolpidem  Physical Exam Constitutional:      Comments: Eyes closed.  Pulmonary:     Effort: Pulmonary effort is normal.             Vital Signs: BP (!) 166/78 (BP Location: Left Arm)   Pulse (!) 109   Temp 98.2 F (36.8 C)   Resp 15   Ht '5\' 3"'$  (1.6 m)   Wt 42.1 kg   SpO2 100%   BMI 16.44 kg/m  SpO2: SpO2: 100 % O2 Device:  O2 Device: Room Air O2 Flow Rate:    Intake/output summary:  Intake/Output Summary (Last 24 hours) at 09/14/2022 1231 Last data filed at 09/14/2022 1047 Gross per 24 hour  Intake 400 ml  Output 1750 ml  Net -1350 ml   LBM: Last BM Date : 09/13/22 Baseline Weight: Weight: 42.1 kg Most recent weight: Weight: 42.1 kg    Patient Active Problem List   Diagnosis Date Noted  . Closed left hip fracture, initial encounter ([place]) 09/10/2022  . Depression with anxiety 09/10/2022  . Elevated CK 09/10/2022  . Leukocytosis 09/10/2022  . Fall at home, initial encounter 09/10/2022  . Protein-calorie malnutrition, severe ([place]) 09/10/2022  . Normocytic anemia 09/10/2022  . Dementia ([place]) 02/02/2022  . Prediabetes 01/19/2022  . Memory disorder 07/04/2021  . Statin intolerance 12/03/2019  . Bilateral carotid artery stenosis 07/29/2019  . History of CVA (cerebrovascular accident) 07/29/2019  . Pure hypercholesterolemia 04/04/2019  . Calcinosis 03/27/2019  . CREST (calcinosis, Raynaud's phenomenon, esophageal dysfunction, sclerodactyly, telangiectasia) ([place]) 03/27/2019  . Encounter for long-term (current) use of high-risk medication 03/27/2019  . Constipation, chronic 06/28/2018  . Primary osteoarthritis of both hands 05/09/2018  . Bilateral hand pain 01/25/2018  . Raynaud's phenomenon without gangrene 01/25/2018  . 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 Carotid stenosis 12/26/2017  . TIA (transient ischemic attack) 11/16/2017  . Acute cerebrovascular accident (CVA) ([place]) 11/16/2017  . Carcinoma of overlapping sites of left breast in  female, estrogen receptor positive ([place]) 11/17/2016  . Symptomatic carotid artery stenosis without infarction 09/11/2016  . H/O appendicitis 08/01/2016  . Thyromegaly 12/22/2015  . Chest pain 05/14/2015  . Arteriosclerosis of coronary artery 07/22/2014  . Disorder of mitral valve 01/17/2014  . Absolute anemia 10/23/2013  . Benign hypertension 10/23/2013  . Dermatophytic onychia 10/23/2013  . HLD (hyperlipidemia) 10/23/2013  . Idiopathic peripheral neuropathy 10/23/2013    Palliative Care Assessment & Plan   Recommendations/Plan: Has been going to to her skilled nursing facilities for his wife. Husband confirms full code/full scope status. PMT will sign off at this time.  Please reconsult if needed.  Code Status:    Code Status Orders  (From admission, onward)           Start     Ordered   09/10/22 1129  Full code  Continuous       Question:  By:  Answer:  Consent: discussion documented in EHR   09/10/22 1129           Code Status History     Date Active Date Inactive Code Status Order ID Comments User Context   12/26/2017 1609 12/27/2017 1809 Full Code [id number]  Katha Cabal, MD Inpatient   11/16/2017 1641 11/17/2017 1959 Full Code [id number]  Nicholes Mango, MD Inpatient   07/26/2016 2215 07/29/2016 1314 Full Code [id number]  Jeanie Cooks, MD ED   06/22/2015 0855 06/22/2015 1335 Full Code [id number]  Teodoro Spray, MD Inpatient   05/14/2015 0651 05/14/2015 2049 Full Code [id number]  Juluis Mire, MD Inpatient      Advance Directive Documentation    Flowsheet Row Most Recent Value  Type of Advance Directive Healthcare Power of Attorney  Pre-existing out of facility DNR order (yellow form or pink MOST form) --  "MOST" Form in Place? --       Prognosis:  Unable to determine   Thank you for allowing the Palliative Medicine Team to assist in the care of this patient.     Asencion Gowda, NP  Please  contact Palliative Medicine Team phone at [phone]  for questions and concerns.

## 2022-09-14 NOTE — Progress Notes (Signed)
Physical Therapy Treatment Patient Details Name: Kathleen Walls MRN: [id number] DOB: [birth year] Today's Date: 09/14/2022   History of Present Illness presented to ER secondary fall with acute onset of L hip pain; admitted for management of comminuted, intertrochanteric fracture of proximal L femur, s/p IM nailing (09/11/22), PWB    PT Comments    Pt was long sitting in bed with pastor, pastor's spouse, and pt's spouse at bedside. Pt is much more lethargic today and per spouse did not eat any breakfast. Author tried to assist pt with sitting EOB nd standing in hopes of pt becoming more alert. However unsuccessful. Total assist required for all mobility today. Tried to use cold washcloth to promote response but pt remained lethargic. Acute PT will continue efforts however pt will need to demonstrate improved abilities to stay alert for longer for any progress to be made. SNF at DC remains most appropriate if plan is still to maximize independence while assisting pt to PLOF.   Recommendations for follow up therapy are one component of a multi-disciplinary discharge planning process, led by the attending physician.  Recommendations may be updated based on patient status, additional functional criteria and insurance authorization.  Follow Up Recommendations  Skilled nursing-short term rehab (<3 hours/day)     Assistance Recommended at Discharge Frequent or constant Supervision/Assistance  Patient can return home with the following Two people to help with walking and/or transfers;Two people to help with bathing/dressing/bathroom;Assistance with cooking/housework;Assistance with feeding;Direct supervision/assist for medications management;Direct supervision/assist for financial management;Assist for transportation;Help with stairs or ramp for entrance   Equipment Recommendations  Other (comment) (defer to next level of care)       Precautions / Restrictions Precautions Precautions:  Fall Restrictions Weight Bearing Restrictions: Yes LLE Weight Bearing: Partial weight bearing (50%) LLE Partial Weight Bearing Percentage or Pounds: 50     Mobility  Bed Mobility Overal bed mobility: Needs Assistance Bed Mobility: Supine to Sit, Sit to Supine  Supine to sit: Total assist Sit to supine: Total assist   General bed mobility comments: Pt lethargy makes session very limited    Transfers Overall transfer level: Needs assistance Equipment used: Rolling walker (2 wheels) Transfers: Sit to/from Stand, Bed to chair/wheelchair/BSC Sit to Stand: Total assist, From elevated surface Stand pivot transfers: Max assist    General transfer comment: Pt required more assistance to stand and get to/from recliner. Author was hopeful standing would increase alertness however unsuccessful    Ambulation/Gait  General Gait Details: unable to perform safely this date due to lethargy    Balance Overall balance assessment: Needs assistance   Sitting balance-Leahy Scale: Poor     Standing balance support: During functional activity Standing balance-Leahy Scale: Zero      Cognition Arousal/Alertness: Lethargic, Suspect due to medications Behavior During Therapy: Restless Overall Cognitive Status: Impaired/Different from baseline      General Comments: Pt did not open her eyes throughout session today. much less verbal without abilities to follow any commands.               Pertinent Vitals/Pain Pain Assessment Pain Assessment: PAINAD Breathing: occasional labored breathing, short period of hyperventilation Negative Vocalization: occasional moan/groan, low speech, negative/disapproving quality Facial Expression: sad, frightened, frown Body Language: tense, distressed pacing, fidgeting Consolability: unable to console, distract or reassure PAINAD Score: 6     PT Goals (current goals can now be found in the care plan section) Acute Rehab PT Goals Patient Stated Goal:  none stated Progress towards PT goals: Not  progressing toward goals - comment (increased lethargy today limited session progression)    Frequency    7X/week      PT Plan Current plan remains appropriate       AM-PAC PT "6 Clicks" Mobility   Outcome Measure  Help needed turning from your back to your side while in a flat bed without using bedrails?: Total Help needed moving from lying on your back to sitting on the side of a flat bed without using bedrails?: Total Help needed moving to and from a bed to a chair (including a wheelchair)?: Total Help needed standing up from a chair using your arms (e.g., wheelchair or bedside chair)?: Total Help needed to walk in hospital room?: Total Help needed climbing 3-5 steps with a railing? : Total 6 Click Score: 6    End of Session Equipment Utilized During Treatment: Gait belt Activity Tolerance: Patient limited by lethargy Patient left: in bed;with call bell/phone within reach;with bed alarm set;with family/visitor present Nurse Communication: Mobility status PT Visit Diagnosis: Muscle weakness (generalized) (M62.81);Other abnormalities of gait and mobility (R26.89);Pain;Difficulty in walking, not elsewhere classified (R26.2) Pain - Right/Left: Left Pain - part of body: Hip     Time: 7035-0093 PT Time Calculation (min) (ACUTE ONLY): 20 min  Charges:  $Therapeutic Activity: 8-22 mins                     Julaine Fusi PTA 09/14/22, 5:26 PM

## 2022-09-14 NOTE — TOC Progression Note (Signed)
Transition of Care [facility]) - Progression Note    Patient Details  Name: Kathleen Walls MRN: [id number] Date of Birth: [birth year]  Transition of Care [facility]) CM/SW [place], RN Phone Number: 09/14/2022, 11:45 AM  Clinical Narrative:    Uploaded the Clinical documents to PASSR  PASSR number [id number], expires 10/13/22  Expected Discharge Plan: [place] Barriers to Discharge: Insurance Authorization, [place] [facility]), SNF Pending bed offer  Expected Discharge Plan and Services       Living arrangements for the past 2 months: Assisted Living Facility                                       Social Determinants of Health (SDOH) Interventions SDOH Screenings   Food Insecurity: No Food Insecurity (09/10/2022)  Housing: Low Risk  (09/10/2022)  Transportation Needs: No Transportation Needs (09/10/2022)  Utilities: Not At Risk (09/10/2022)  Tobacco Use: Medium Risk (09/12/2022)    Readmission Risk Interventions     No data to display

## 2022-09-15 MED ORDER — BISACODYL 10 MG RE SUPP: 10.0000 mg | Freq: Once | RECTAL | Status: DC

## 2022-09-15 MED ORDER — BISACODYL 10 MG RE SUPP: 10.0000 mg | Freq: Every day | RECTAL | Status: DC | PRN

## 2022-09-15 MED ORDER — HYDROCODONE-ACETAMINOPHEN 5-325 MG PO TABS: 1.0000 | ORAL_TABLET | ORAL | 0 refills | Status: DC | PRN

## 2022-09-15 MED ORDER — BISACODYL 10 MG RE SUPP: 10.0000 mg | Freq: Every day | RECTAL | 0 refills | Status: DC | PRN

## 2022-09-15 MED ORDER — FERROUS SULFATE 325 (65 FE) MG PO TABS: 325.0000 mg | ORAL_TABLET | Freq: Every day | ORAL | 3 refills | Status: DC

## 2022-09-15 MED ORDER — ASPIRIN EC 81 MG PO TBEC: 81.0000 mg | DELAYED_RELEASE_TABLET | Freq: Two times a day (BID) | ORAL | 3 refills | Status: DC

## 2022-09-15 MED ORDER — ADULT MULTIVITAMIN W/MINERALS CH: 1.0000 | ORAL_TABLET | Freq: Every day | ORAL | 1 refills | Status: DC

## 2022-09-15 MED ORDER — SENNA 8.6 MG PO TABS: 1.0000 | ORAL_TABLET | Freq: Two times a day (BID) | ORAL | 0 refills | Status: DC

## 2022-09-15 NOTE — TOC Progression Note (Signed)
Transition of Care [facility]) - Progression Note    Patient Details  Name: Kathleen Walls MRN: [id number] Date of Birth: January 26, 1941  Transition of Care [facility]) CM/SW [place], RN Phone Number: 09/15/2022, 11:39 AM  Clinical Narrative:   Spoke with the son Gwyndolyn Saxon and let him know that the patient will go to room 304 at [facility] in [place], the bedside nurse to call report, EMS called, there is one ahead of her    Expected Discharge Plan: [place] Barriers to Discharge: Insurance Authorization, [place] [facility]), SNF Pending bed offer  Expected Discharge Plan and Services       Living arrangements for the past 2 months: [place] Expected Discharge Date: 09/15/22                                     Social Determinants of Health (SDOH) Interventions SDOH Screenings   Food Insecurity: No Food Insecurity (09/10/2022)  Housing: Low Risk  (09/10/2022)  Transportation Needs: No Transportation Needs (09/10/2022)  Utilities: Not At Risk (09/10/2022)  Tobacco Use: Medium Risk (09/12/2022)    Readmission Risk Interventions     No data to display

## 2022-09-15 NOTE — TOC Progression Note (Signed)
Transition of Care [facility]) - Progression Note    Patient Details  Name: Kathleen Walls MRN: [id number] Date of Birth: 1940-11-29  Transition of Care [facility]) CM/SW [place], RN Phone Number: 09/15/2022, 8:42 AM  Clinical Narrative:    Insurance Approved for [employer] 2/2 - 2/6, review due 2/6. Reference ID: [id number]. Plan auth still generating at this time.   Expected Discharge Plan: Skilled Nursing Facility Barriers to Discharge: Insurance Authorization, [place] [facility]), SNF Pending bed offer  Expected Discharge Plan and Services       Living arrangements for the past 2 months: Assisted Living Facility                                       Social Determinants of Health (SDOH) Interventions SDOH Screenings   Food Insecurity: No Food Insecurity (09/10/2022)  Housing: Low Risk  (09/10/2022)  Transportation Needs: No Transportation Needs (09/10/2022)  Utilities: Not At Risk (09/10/2022)  Tobacco Use: Medium Risk (09/12/2022)    Readmission Risk Interventions     No data to display

## 2022-09-15 NOTE — Plan of Care (Signed)
  Problem: Clinical Measurements: Goal: Diagnostic test results will improve Outcome: Progressing Goal: Cardiovascular complication will be avoided Outcome: Progressing   Problem: Activity: Goal: Risk for activity intolerance will decrease Outcome: Progressing   Problem: Nutrition: Goal: Adequate nutrition will be maintained Outcome: Progressing   Problem: Elimination: Goal: Will not experience complications related to bowel motility Outcome: Progressing   Problem: Pain Managment: Goal: General experience of comfort will improve Outcome: Progressing

## 2022-09-15 NOTE — TOC Progression Note (Signed)
Transition of Care [facility]) - Progression Note    Patient Details  Name: Kathleen Walls MRN: [id number] Date of Birth: 17-Aug-1940  Transition of Care [facility]) CM/SW [place], RN Phone Number: 09/15/2022, 10:04 AM  Clinical Narrative:    The patient is to DC to [facility] today, DC Summary sent  I requested the bed number and number to call report to   Expected Discharge Plan: Skilled Nursing Facility Barriers to Discharge: Ship broker, [place] [facility] Gums), SNF Pending bed offer  Expected Discharge Plan and Services       Living arrangements for the past 2 months: [place] Expected Discharge Date: 09/15/22                                     Social Determinants of Health (SDOH) Interventions SDOH Screenings   Food Insecurity: No Food Insecurity (09/10/2022)  Housing: Low Risk  (09/10/2022)  Transportation Needs: No Transportation Needs (09/10/2022)  Utilities: Not At Risk (09/10/2022)  Tobacco Use: Medium Risk (09/12/2022)    Readmission Risk Interventions     No data to display

## 2022-09-15 NOTE — Care Management Important Message (Signed)
Important Message  Patient Details  Name: Kathleen Walls MRN: [id number] Date of Birth: [birth year]   Medicare Important Message Given:  Yes     Dannette Barbara 09/15/2022, 10:46 AM

## 2022-09-15 NOTE — Discharge Summary (Addendum)
Physician Discharge Summary   Patient: Kathleen Walls MRN: [id number] DOB: 1941/02/24  Admit date:     09/10/2022  Discharge date: 09/15/22  Discharge Physician: [name]   PCP: [facility]   Recommendations at discharge:    F/u Dr Sabra Heck in 2 weeks for post op check and suture removal F/u PCP in 1-2 weeks  Discharge Diagnoses: Principal Problem:   Closed left hip fracture, initial encounter [facility]) Active Problems:   Fall at home, initial encounter   Leukocytosis   Benign hypertension   Arteriosclerosis of coronary artery   HLD (hyperlipidemia)   CREST (calcinosis, Raynaud's phenomenon, esophageal dysfunction, sclerodactyly, telangiectasia) (HCC)   History of CVA (cerebrovascular accident)   Dementia ([place])   Elevated CK   Normocytic anemia   Depression with anxiety   Protein-calorie malnutrition, severe (HCC)   Kathleen Walls is a 82 y.o. female with medical history significant of dementia, hypertension, hyperlipidemia, prediabetes, CAD with stent placement, stroke/TIA, GERD, depression with anxiety, kidney stone, breast cancer, diverticulitis, CREST syndrome, who presents with fall and left hip pain.  pt is from the [place] [facility] [place].    Chest x-ray negative.   CT of head negative.  X-ray of left hip showed comminuted intratrochanteric fracture of the proximal left femur with increased varus angulation    Closed left hip fracture ([place]): X-ray showed comminuted intratrochanteric fracture of the proximal left femur with increased varus angulation. Patient seems to have severe pain now. No neurovascular compromise.  --Orthopedic surgeon, Dr. Sabra Heck was consulted-- patient is status post Intertrochanteric intramedullary nail  - Pain control: prn morphine, percocet and tyleno - When necessary Zofran for nausea - Robaxin for muscle spasm - Lidoderm patch for pain   Normocytic anemia/Post-op blood loss --came in with hgb 11.4 10.5--9.4--7.7--1 unit BT today (d/w  dr miller)--9.4   Leukocytosis: WBC 22.8, no fever, no source of infection identified, likely due to stress-induced demargination. Patient does not have signs of infection. -Follow-up CBC 22K--16 k--14K -Follow-up urinalysis--neg for UTI   Fall at home, initial encounter - PT/OT  recommends rehab   Benign hypertension -Amlodipine, metoprolol   Arteriosclerosis of coronary artery:  HLD (hyperlipidemia) -Zetia   CREST (calcinosis, Raynaud's phenomenon, esophageal dysfunction, sclerodactyly, telangiectasia) (HCC) -Plaquenil   History of CVA (cerebrovascular accident) -ASA and zetia   Dementia ([place]) -Continue home olanzapine ,namenda -As needed Haldol for agitation   Elevated CK: CK level 362 -received IVF --due to hip fracture   Depression with anxiety -Continue home medications: Remeron, venlafaxine   Protein-calorie malnutrition, severe ([place]): Body weight 42.1 kg, BMI 16.44 F/u RD recs Nutrition Problem: Severe Malnutrition Etiology: chronic illness ([place]) Signs/Symptoms: moderate fat depletion, severe fat depletion, moderate muscle depletion, severe muscle depletion Interventions: MVI, Hormel Shake   Overall improving slowly. D/c to SNF today. D/w husband Pt was seen by Palliative care  Procedures: INTRAMEDULLARY (IM) NAIL INTERTROCHANTERIC left hip  Family communication : husband at bedside Consults :orthopedic CODE STATUS: Full code DVT Prophylaxis :  at d/c asa 81 mg bid for 6 weeks     Pain control - [place] Controlled Substance Reporting System database was reviewed. and patient was instructed, not to drive, operate heavy machinery, perform activities at heights, swimming or participation in water activities or provide baby-sitting services while on Pain, Sleep and Anxiety Medications; until their outpatient Physician has advised to do so again. Also recommended to not to take more than prescribed Pain, Sleep and Anxiety Medications.  Disposition:  Rehabilitation facility Diet  recommendation:  Discharge Diet Orders (From admission, onward)     Start     Ordered   09/15/22 0000  Diet - low sodium heart healthy        09/15/22 0933           Cardiac diet DISCHARGE MEDICATION: Allergies as of 09/15/2022       Reactions   Alendronate Sodium Other (See Comments)   Unknown   Atorvastatin Other (See Comments)   Muscle Pain   Azithromycin Hives   Ciprofloxacin Other (See Comments)   Headache   Colestipol Other (See Comments)   Unknown   Lidocaine Hives   Lovastatin Other (See Comments)   Headache   Metronidazole Other (See Comments)   Headache   Niacin Other (See Comments)   Unknown   Risedronate Sodium Other (See Comments)   Unknown   Rosuvastatin Other (See Comments)   Unknown   Simvastatin Other (See Comments)   Headache        Medication List     STOP taking these medications    ALPRAZolam 0.25 MG tablet Commonly known as: XANAX   Azelaic Acid 15 % gel   butalbital-acetaminophen-caffeine 50-325-40 MG tablet Commonly known as: FIORICET   docusate sodium 100 MG capsule Commonly known as: COLACE   hydrocortisone 2.5 % cream   melatonin 3 MG Tabs tablet   nitroGLYCERIN 0.4 MG SL tablet Commonly known as: NITROSTAT   sodium chloride 5 % ophthalmic solution Commonly known as: MURO 128       TAKE these medications    acetaminophen 325 MG tablet Commonly known as: TYLENOL Take 650 mg by mouth every 4 (four) hours as needed for mild pain.   amLODipine 2.5 MG tablet Commonly known as: NORVASC Take 2.5 mg by mouth daily.   aspirin EC 81 MG tablet Take 1 tablet (81 mg total) by mouth 2 (two) times daily. For 6 weeks and then change to daily What changed:  when to take this additional instructions   bisacodyl 10 MG suppository Commonly known as: DULCOLAX Place 1 suppository (10 mg total) rectally daily as needed for moderate constipation.   ezetimibe 10 MG tablet Commonly known as:  ZETIA Take 10 mg by mouth daily.   ferrous sulfate 325 (65 FE) MG tablet Take 1 tablet (325 mg total) by mouth daily with breakfast.   haloperidol 0.5 MG tablet Commonly known as: HALDOL Take 0.5 mg by mouth every 8 (eight) hours as needed for agitation.   HYDROcodone-acetaminophen 5-325 MG tablet Commonly known as: NORCO/VICODIN Take 1 tablet by mouth every 4 (four) hours as needed for moderate pain or severe pain (half a tab for moderate pain, 1 tab for severe pain.).   hydroxychloroquine 200 MG tablet Commonly known as: PLAQUENIL Take 200 mg by mouth daily.   memantine 5 MG tablet Commonly known as: NAMENDA Take 5 mg by mouth 2 (two) times daily.   metoprolol succinate 25 MG 24 hr tablet Commonly known as: TOPROL-XL Take 12.5 mg by mouth daily.   mirtazapine 15 MG tablet Commonly known as: REMERON Take 15 mg by mouth at bedtime.   multivitamin with minerals Tabs tablet Take 1 tablet by mouth daily.   OLANZapine 2.5 MG tablet Commonly known as: ZyPREXA Take 1 tablet (2.5 mg total) by mouth at bedtime.   omeprazole 20 MG capsule Commonly known as: PRILOSEC Take 20 mg by mouth 2 (two) times daily before a meal.   senna 8.6 MG Tabs tablet Commonly known as:  SENOKOT Take 1 tablet (8.6 mg total) by mouth 2 (two) times daily.   Venlafaxine HCl 75 MG Tb24 Take 75 mg by mouth daily. What changed: Another medication with the same name was removed. Continue taking this medication, and follow the directions you see here.        Follow-up Information     Earnestine Leys, MD Follow up in 2 week(s).   Specialty: Orthopedic Surgery Why: For suture removal, For wound re-check, For re-evaluation Make appt prior to D/C Contact information: [place] [address] [phone]         [facility]. Schedule an appointment as soon as possible for a visit in 1 week(s).   Specialty: Geriatric Medicine Contact information: [place] [address] [phone]                 Filed Weights   09/10/22 0930 09/11/22 1118  Weight: 42.1 kg 42.1 kg     Condition at discharge: fair  The results of significant diagnostics from this hospitalization (including imaging, microbiology, ancillary and laboratory) are listed below for reference.   Imaging Studies: DG HIP UNILAT WITH PELVIS 2-3 VIEWS LEFT  Result Date: 09/11/2022 CLINICAL DATA:  Intramedullary nail EXAM: DG HIP (WITH OR WITHOUT PELVIS) 2-3V LEFT COMPARISON:  Yesterday FINDINGS: Multiple intraoperative images demonstrate placement of an intramedullary nail with proximal and distal locking screw across the previously described comminuted intertrochanteric left femur fracture. Improved alignment without acute hardware complication. IMPRESSION: Intraoperative imaging of left proximal femur fixation. Electronically Signed   By: Abigail Miyamoto M.D.   On: 09/11/2022 14:21   DG C-Arm 1-60 Min-No Report  Result Date: 09/11/2022 Fluoroscopy was utilized by the requesting physician.  No radiographic interpretation.   CT Head Wo Contrast  Result Date: 09/10/2022 CLINICAL DATA:  82 year old female status post fall, found down. Hip fracture. EXAM: CT HEAD WITHOUT CONTRAST TECHNIQUE: Contiguous axial images were obtained from the base of the skull through the vertex without intravenous contrast. RADIATION DOSE REDUCTION: This exam was performed according to the departmental dose-optimization program which includes automated exposure control, adjustment of the mA and/or kV according to patient size and/or use of iterative reconstruction technique. COMPARISON:  Brain MRI 11/16/2017.  Head CT 04/23/2022. FINDINGS: Brain: Small chronic infarct at the right superior frontal gyrus is stable from last year, was acute in 2019. Stable cerebral volume. No midline shift, ventriculomegaly, mass effect, evidence of mass lesion, intracranial hemorrhage or evidence  of cortically based acute infarction. Stable gray-white matter differentiation throughout the brain. Vascular: Calcified atherosclerosis at the skull base. No suspicious intracranial vascular hyperdensity. Skull: No acute osseous abnormality identified. Sinuses/Orbits: Visualized paranasal sinuses and mastoids are stable and well aerated. Other: No orbit or scalp soft tissue injury identified. IMPRESSION: 1. No acute intracranial abnormality or acute traumatic injury identified. 2. 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 Electronically Signed   By: Genevie Ann M.D.   On: 09/10/2022 10:05   DG Chest Port 1 View  Result Date: 09/10/2022 CLINICAL DATA:  Fall.  Hip fracture. EXAM: PORTABLE CHEST 1 VIEW COMPARISON:  Two-view chest x-ray 03/10/2022 FINDINGS: The heart size is normal. Atherosclerotic calcifications are present at the aortic arch. Lungs are clear. Calcification of the left breast is stable. The visualized soft tissues and bony thorax are otherwise unremarkable. IMPRESSION: 1. No acute cardiopulmonary disease. 2. Aortic atherosclerosis. Electronically Signed   By: San Morelle M.D.   On:  09/10/2022 09:52   DG Hip Unilat W or Wo Pelvis 2-3 Views Left  Result Date: 09/10/2022 CLINICAL DATA:  Fall. Patient found on floor last night. Left hip pain. EXAM: DG HIP (WITH OR WITHOUT PELVIS) 3V LEFT COMPARISON:  None Available. FINDINGS: Comminuted intratrochanteric fracture of the proximal left femur demonstrates increased varus angulation. Lesser trochanter is displaced. Visualized pelvis is intact. IMPRESSION: Comminuted intratrochanteric fracture of the proximal left femur with increased varus angulation. Electronically Signed   By: San Morelle M.D.   On: 09/10/2022 09:51    Microbiology: Results for orders placed or performed during the hospital encounter of 03/10/22  Resp Panel by RT-PCR (Flu A&B, Covid) Anterior Nasal Swab     Status: None   Collection Time: 03/10/22   3:16 PM   Specimen: Anterior Nasal Swab  Result Value Ref Range Status   SARS Coronavirus 2 by RT PCR NEGATIVE NEGATIVE Final    Comment: (NOTE) SARS-CoV-2 target nucleic acids are NOT DETECTED.  The SARS-CoV-2 RNA is generally detectable in upper respiratory specimens during the acute phase of infection. The lowest concentration of SARS-CoV-2 viral copies this assay can detect is 138 copies/mL. A negative result does not preclude SARS-Cov-2 infection and should not be used as the sole basis for treatment or other patient management decisions. A negative result may occur with  improper specimen collection/handling, submission of specimen other than nasopharyngeal swab, presence of viral mutation(s) within the areas targeted by this assay, and inadequate number of viral copies(<138 copies/mL). A negative result must be combined with clinical observations, patient history, and epidemiological information. The expected result is Negative.  Fact Sheet for Patients:  EntrepreneurPulse.com.au  Fact Sheet for Healthcare Providers:  IncredibleEmployment.be  This test is no t yet approved or cleared by the [place] FDA and  has been authorized for detection and/or diagnosis of SARS-CoV-2 by FDA under an Emergency Use Authorization (EUA). This EUA will remain  in effect (meaning this test can be used) for the duration of the COVID-19 declaration under Section 564(b)(1) of the Act, 21 U.S.C.section 360bbb-3(b)(1), unless the authorization is terminated  or revoked sooner.       Influenza A by PCR NEGATIVE NEGATIVE Final   Influenza B by PCR NEGATIVE NEGATIVE Final    Comment: (NOTE) The Xpert Xpress SARS-CoV-2/FLU/RSV plus assay is intended as an aid in the diagnosis of influenza from Nasopharyngeal swab specimens and should not be used as a sole basis for treatment. Nasal washings and aspirates are unacceptable for Xpert Xpress  SARS-CoV-2/FLU/RSV testing.  Fact Sheet for Patients: EntrepreneurPulse.com.au  Fact Sheet for Healthcare Providers: IncredibleEmployment.be  This test is not yet approved or cleared by the [place] FDA and has been authorized for detection and/or diagnosis of SARS-CoV-2 by FDA under an Emergency Use Authorization (EUA). This EUA will remain in effect (meaning this test can be used) for the duration of the COVID-19 declaration under Section 564(b)(1) of the Act, 21 U.S.C. section 360bbb-3(b)(1), unless the authorization is terminated or revoked.  Performed at [facility], [address] [place], [place] [address]     Labs: CBC: Recent Labs  Lab 09/10/22 [phone] 09/10/22 2025 09/11/22 0248 09/12/22 0238 09/13/22 0456 09/14/22 0603  WBC 22.8*  --  16.8* 19.6* 14.7* 13.7*  NEUTROABS 20.6*  --   --   --   --   --   HGB 10.5* 9.9* 9.4* 7.7* 9.3* 9.3*  HCT 35.2*  --  29.8* 24.6* 29.0* 29.4*  MCV 83.8  --  81.0 81.7 81.7 81.7  PLT 293  --  239 238 200 314   Basic Metabolic Panel: Recent Labs  Lab 09/10/22 0937 09/11/22 0248 09/12/22 0238 09/13/22 0456 09/14/22 0603  NA 137 138 136 138 140  K 4.2 3.7 3.8 2.9* 3.8  CL 104 102 104 108 107  CO2 '28 27 26 24 27  '$ GLUCOSE 140* 115* 113* 114* 126*  BUN '20 16 18 14 18  '$ CREATININE 0.75 0.62 0.77 0.59 0.58  CALCIUM 8.9 8.5* 8.3* 8.0* 8.1*  Discharge time spent: greater than 30 minutes.  Signed: [name], MD [facility] 09/15/2022

## 2022-10-09 ENCOUNTER — Inpatient Hospital Stay
Admission: EM | Admit: 2022-10-09 | Discharge: 2022-10-13 | DRG: 871 | Disposition: A | Discharge status: Skilled Nursing Facility | Payer: 59 | Source: Skilled Nursing Facility | Attending: Internal Medicine | Admitting: Internal Medicine

## 2022-10-09 ENCOUNTER — Emergency Department: Payer: 59

## 2022-10-09 ENCOUNTER — Other Ambulatory Visit: Payer: Self-pay

## 2022-10-09 DIAGNOSIS — I2510 Atherosclerotic heart disease of native coronary artery without angina pectoris: Secondary | ICD-10-CM | POA: Diagnosis present

## 2022-10-09 DIAGNOSIS — K2101 Gastro-esophageal reflux disease with esophagitis, with bleeding: Secondary | ICD-10-CM | POA: Diagnosis present

## 2022-10-09 DIAGNOSIS — I110 Hypertensive heart disease with heart failure: Secondary | ICD-10-CM | POA: Diagnosis present

## 2022-10-09 DIAGNOSIS — E162 Hypoglycemia, unspecified: Secondary | ICD-10-CM | POA: Diagnosis not present

## 2022-10-09 DIAGNOSIS — R7303 Prediabetes: Secondary | ICD-10-CM | POA: Diagnosis present

## 2022-10-09 DIAGNOSIS — R4182 Altered mental status, unspecified: Secondary | ICD-10-CM | POA: Diagnosis not present

## 2022-10-09 DIAGNOSIS — K649 Unspecified hemorrhoids: Secondary | ICD-10-CM | POA: Diagnosis present

## 2022-10-09 DIAGNOSIS — R6520 Severe sepsis without septic shock: Secondary | ICD-10-CM | POA: Diagnosis present

## 2022-10-09 DIAGNOSIS — Z87442 Personal history of urinary calculi: Secondary | ICD-10-CM

## 2022-10-09 DIAGNOSIS — Z90711 Acquired absence of uterus with remaining cervical stump: Secondary | ICD-10-CM

## 2022-10-09 DIAGNOSIS — Z882 Allergy status to sulfonamides status: Secondary | ICD-10-CM

## 2022-10-09 DIAGNOSIS — R627 Adult failure to thrive: Secondary | ICD-10-CM | POA: Diagnosis present

## 2022-10-09 DIAGNOSIS — Z79899 Other long term (current) drug therapy: Secondary | ICD-10-CM | POA: Diagnosis not present

## 2022-10-09 DIAGNOSIS — G43909 Migraine, unspecified, not intractable, without status migrainosus: Secondary | ICD-10-CM | POA: Diagnosis present

## 2022-10-09 DIAGNOSIS — E872 Acidosis, unspecified: Secondary | ICD-10-CM | POA: Diagnosis present

## 2022-10-09 DIAGNOSIS — E860 Dehydration: Secondary | ICD-10-CM | POA: Diagnosis present

## 2022-10-09 DIAGNOSIS — K920 Hematemesis: Secondary | ICD-10-CM | POA: Diagnosis present

## 2022-10-09 DIAGNOSIS — K828 Other specified diseases of gallbladder: Secondary | ICD-10-CM | POA: Diagnosis present

## 2022-10-09 DIAGNOSIS — Z515 Encounter for palliative care: Secondary | ICD-10-CM | POA: Diagnosis not present

## 2022-10-09 DIAGNOSIS — Z8673 Personal history of transient ischemic attack (TIA), and cerebral infarction without residual deficits: Secondary | ICD-10-CM

## 2022-10-09 DIAGNOSIS — J690 Pneumonitis due to inhalation of food and vomit: Secondary | ICD-10-CM | POA: Diagnosis present

## 2022-10-09 DIAGNOSIS — E43 Unspecified severe protein-calorie malnutrition: Secondary | ICD-10-CM | POA: Diagnosis present

## 2022-10-09 DIAGNOSIS — Z955 Presence of coronary angioplasty implant and graft: Secondary | ICD-10-CM

## 2022-10-09 DIAGNOSIS — M8580 Other specified disorders of bone density and structure, unspecified site: Secondary | ICD-10-CM | POA: Diagnosis present

## 2022-10-09 DIAGNOSIS — Z888 Allergy status to other drugs, medicaments and biological substances status: Secondary | ICD-10-CM

## 2022-10-09 DIAGNOSIS — Z7189 Other specified counseling: Secondary | ICD-10-CM | POA: Diagnosis not present

## 2022-10-09 DIAGNOSIS — I5032 Chronic diastolic (congestive) heart failure: Secondary | ICD-10-CM | POA: Diagnosis present

## 2022-10-09 DIAGNOSIS — F03C3 Unspecified dementia, severe, with mood disturbance: Secondary | ICD-10-CM | POA: Diagnosis present

## 2022-10-09 DIAGNOSIS — K760 Fatty (change of) liver, not elsewhere classified: Secondary | ICD-10-CM | POA: Diagnosis present

## 2022-10-09 DIAGNOSIS — Z803 Family history of malignant neoplasm of breast: Secondary | ICD-10-CM

## 2022-10-09 DIAGNOSIS — Z801 Family history of malignant neoplasm of trachea, bronchus and lung: Secondary | ICD-10-CM

## 2022-10-09 DIAGNOSIS — F418 Other specified anxiety disorders: Secondary | ICD-10-CM | POA: Diagnosis present

## 2022-10-09 DIAGNOSIS — G9341 Metabolic encephalopathy: Secondary | ICD-10-CM | POA: Diagnosis present

## 2022-10-09 DIAGNOSIS — F01518 Vascular dementia, unspecified severity, with other behavioral disturbance: Secondary | ICD-10-CM | POA: Diagnosis not present

## 2022-10-09 DIAGNOSIS — K209 Esophagitis, unspecified without bleeding: Secondary | ICD-10-CM | POA: Diagnosis present

## 2022-10-09 DIAGNOSIS — E785 Hyperlipidemia, unspecified: Secondary | ICD-10-CM | POA: Diagnosis present

## 2022-10-09 DIAGNOSIS — S72002D Fracture of unspecified part of neck of left femur, subsequent encounter for closed fracture with routine healing: Secondary | ICD-10-CM

## 2022-10-09 DIAGNOSIS — Z806 Family history of leukemia: Secondary | ICD-10-CM

## 2022-10-09 DIAGNOSIS — Z170 Estrogen receptor positive status [ER+]: Secondary | ICD-10-CM

## 2022-10-09 DIAGNOSIS — Z7982 Long term (current) use of aspirin: Secondary | ICD-10-CM

## 2022-10-09 DIAGNOSIS — F03C4 Unspecified dementia, severe, with anxiety: Secondary | ICD-10-CM | POA: Diagnosis present

## 2022-10-09 DIAGNOSIS — Z87891 Personal history of nicotine dependence: Secondary | ICD-10-CM

## 2022-10-09 DIAGNOSIS — F0390 Unspecified dementia without behavioral disturbance: Secondary | ICD-10-CM | POA: Diagnosis present

## 2022-10-09 DIAGNOSIS — F32A Depression, unspecified: Secondary | ICD-10-CM | POA: Diagnosis present

## 2022-10-09 DIAGNOSIS — Z66 Do not resuscitate: Secondary | ICD-10-CM | POA: Diagnosis present

## 2022-10-09 DIAGNOSIS — R64 Cachexia: Secondary | ICD-10-CM | POA: Diagnosis present

## 2022-10-09 DIAGNOSIS — Z8249 Family history of ischemic heart disease and other diseases of the circulatory system: Secondary | ICD-10-CM

## 2022-10-09 DIAGNOSIS — K922 Gastrointestinal hemorrhage, unspecified: Secondary | ICD-10-CM | POA: Diagnosis not present

## 2022-10-09 DIAGNOSIS — Z8049 Family history of malignant neoplasm of other genital organs: Secondary | ICD-10-CM

## 2022-10-09 DIAGNOSIS — Z1152 Encounter for screening for COVID-19: Secondary | ICD-10-CM | POA: Diagnosis not present

## 2022-10-09 DIAGNOSIS — M341 CR(E)ST syndrome: Secondary | ICD-10-CM | POA: Diagnosis present

## 2022-10-09 DIAGNOSIS — I700 Atherosclerosis of aorta: Secondary | ICD-10-CM | POA: Diagnosis present

## 2022-10-09 DIAGNOSIS — Z681 Body mass index (BMI) 19 or less, adult: Secondary | ICD-10-CM | POA: Diagnosis not present

## 2022-10-09 DIAGNOSIS — I10 Essential (primary) hypertension: Secondary | ICD-10-CM | POA: Diagnosis present

## 2022-10-09 DIAGNOSIS — Z853 Personal history of malignant neoplasm of breast: Secondary | ICD-10-CM

## 2022-10-09 DIAGNOSIS — A419 Sepsis, unspecified organism: Principal | ICD-10-CM | POA: Diagnosis present

## 2022-10-09 DIAGNOSIS — Z833 Family history of diabetes mellitus: Secondary | ICD-10-CM

## 2022-10-09 DIAGNOSIS — Z881 Allergy status to other antibiotic agents status: Secondary | ICD-10-CM

## 2022-10-09 LAB — CBC
HCT: 32.3 % — ABNORMAL LOW (ref 36.0–46.0)
Hemoglobin: 10.3 g/dL — ABNORMAL LOW (ref 12.0–15.0)
MCH: 26.2 pg (ref 26.0–34.0)
MCHC: 31.9 g/dL (ref 30.0–36.0)
MCV: 82.2 fL (ref 80.0–100.0)
Platelets: 326 10*3/uL (ref 150–400)
RBC: 3.93 MIL/uL (ref 3.87–5.11)
RDW: 15.7 % — ABNORMAL HIGH (ref 11.5–15.5)
WBC: 20 10*3/uL — ABNORMAL HIGH (ref 4.0–10.5)
nRBC: 0 % (ref 0.0–0.2)

## 2022-10-09 LAB — RESP PANEL BY RT-PCR (RSV, FLU A&B, COVID)  RVPGX2
Influenza A by PCR: NEGATIVE
Influenza B by PCR: NEGATIVE
Resp Syncytial Virus by PCR: NEGATIVE
SARS Coronavirus 2 by RT PCR: NEGATIVE

## 2022-10-09 LAB — COMPREHENSIVE METABOLIC PANEL
ALT: 24 U/L (ref 0–44)
AST: 35 U/L (ref 15–41)
Albumin: 3.3 g/dL — ABNORMAL LOW (ref 3.5–5.0)
Alkaline Phosphatase: 210 U/L — ABNORMAL HIGH (ref 38–126)
Anion gap: 15 (ref 5–15)
BUN: 37 mg/dL — ABNORMAL HIGH (ref 8–23)
CO2: 28 mmol/L (ref 22–32)
Calcium: 9.5 mg/dL (ref 8.9–10.3)
Chloride: 95 mmol/L — ABNORMAL LOW (ref 98–111)
Creatinine, Ser: 0.9 mg/dL (ref 0.44–1.00)
GFR, Estimated: >60 mL/min (ref >60)
Glucose, Bld: 219 mg/dL — ABNORMAL HIGH (ref 70–99)
Potassium: 3.7 mmol/L (ref 3.5–5.1)
Sodium: 138 mmol/L (ref 135–145)
Total Bilirubin: 0.5 mg/dL (ref 0.3–1.2)
Total Protein: 7.1 g/dL (ref 6.5–8.1)

## 2022-10-09 LAB — CBC WITH DIFFERENTIAL/PLATELET
Abs Immature Granulocytes: 0.1 10*3/uL — ABNORMAL HIGH (ref 0.00–0.07)
Basophils Absolute: 0.1 10*3/uL (ref 0.0–0.1)
Basophils Relative: 0 %
Eosinophils Absolute: 0.2 10*3/uL (ref 0.0–0.5)
Eosinophils Relative: 1 %
HCT: 44.9 % (ref 36.0–46.0)
Hemoglobin: 13.7 g/dL (ref 12.0–15.0)
Immature Granulocytes: 0 %
Lymphocytes Relative: 2 %
Lymphs Abs: 0.4 10*3/uL — ABNORMAL LOW (ref 0.7–4.0)
MCH: 25.8 pg — ABNORMAL LOW (ref 26.0–34.0)
MCHC: 30.5 g/dL (ref 30.0–36.0)
MCV: 84.7 fL (ref 80.0–100.0)
Monocytes Absolute: 1.2 10*3/uL — ABNORMAL HIGH (ref 0.1–1.0)
Monocytes Relative: 5 %
Neutro Abs: 23 10*3/uL — ABNORMAL HIGH (ref 1.7–7.7)
Neutrophils Relative %: 92 %
Platelets: 473 10*3/uL — ABNORMAL HIGH (ref 150–400)
RBC: 5.3 MIL/uL — ABNORMAL HIGH (ref 3.87–5.11)
RDW: 16 % — ABNORMAL HIGH (ref 11.5–15.5)
WBC: 25 10*3/uL — ABNORMAL HIGH (ref 4.0–10.5)
nRBC: 0 % (ref 0.0–0.2)

## 2022-10-09 LAB — URINALYSIS, W/ REFLEX TO CULTURE (INFECTION SUSPECTED)
Bacteria, UA: NONE SEEN
Bilirubin Urine: NEGATIVE
Glucose, UA: NEGATIVE mg/dL
Hgb urine dipstick: NEGATIVE
Ketones, ur: 5 mg/dL — AB
Leukocytes,Ua: NEGATIVE
Nitrite: NEGATIVE
Protein, ur: 30 mg/dL — AB
Specific Gravity, Urine: 1.027 (ref 1.005–1.030)
pH: 5 (ref 5.0–8.0)

## 2022-10-09 LAB — LACTIC ACID, PLASMA
Lactic Acid, Venous: 4.5 mmol/L (ref 0.5–1.9)
Lactic Acid, Venous: 3.9 mmol/L (ref 0.5–1.9)
Lactic Acid, Venous: 2.4 mmol/L (ref 0.5–1.9)
Lactic Acid, Venous: 1.1 mmol/L (ref 0.5–1.9)

## 2022-10-09 LAB — STREP PNEUMONIAE URINARY ANTIGEN: Strep Pneumo Urinary Antigen: NEGATIVE

## 2022-10-09 LAB — PROCALCITONIN: Procalcitonin: 0.46 ng/mL

## 2022-10-09 LAB — BLOOD GAS, VENOUS
Acid-Base Excess: 3.9 mmol/L — ABNORMAL HIGH (ref 0.0–2.0)
Bicarbonate: 31.3 mmol/L — ABNORMAL HIGH (ref 20.0–28.0)
O2 Saturation: 40.7 %
Patient temperature: 37
pCO2, Ven: 58 mmHg (ref 44–60)
pH, Ven: 7.34 (ref 7.25–7.43)
pO2, Ven: <31 mmHg — CL (ref 32–45)

## 2022-10-09 LAB — APTT: aPTT: 24 seconds (ref 24–36)

## 2022-10-09 LAB — BRAIN NATRIURETIC PEPTIDE: B Natriuretic Peptide: 82.8 pg/mL (ref 0.0–100.0)

## 2022-10-09 LAB — PROTIME-INR
INR: 1.1 (ref 0.8–1.2)
Prothrombin Time: 13.9 seconds (ref 11.4–15.2)

## 2022-10-09 LAB — LIPASE, BLOOD: Lipase: 26 U/L (ref 11–51)

## 2022-10-09 LAB — MRSA NEXT GEN BY PCR, NASAL: MRSA by PCR Next Gen: DETECTED — AB

## 2022-10-09 LAB — CBG MONITORING, ED: Glucose-Capillary: 188 mg/dL — ABNORMAL HIGH (ref 70–99)

## 2022-10-09 MED ORDER — PIPERACILLIN-TAZOBACTAM 3.375 G IVPB
3.3750 g | Freq: Three times a day (TID) | INTRAVENOUS | Status: DC
  Administered 2022-10-09 – 2022-10-13 (×12): 3.375 g via INTRAVENOUS
  Filled 2022-10-09 (×12): qty 50

## 2022-10-09 MED ORDER — PANTOPRAZOLE SODIUM 40 MG IV SOLR
40.0000 mg | Freq: Once | INTRAVENOUS | Status: AC
  Administered 2022-10-09: 40 mg via INTRAVENOUS
  Filled 2022-10-09: qty 10

## 2022-10-09 MED ORDER — VENLAFAXINE HCL ER 75 MG PO CP24
75.0000 mg | ORAL_CAPSULE | Freq: Every day | ORAL | Status: DC
  Administered 2022-10-12 – 2022-10-13 (×2): 75 mg via ORAL
  Filled 2022-10-09 (×4): qty 1

## 2022-10-09 MED ORDER — ACETAMINOPHEN 325 MG PO TABS: 650.0000 mg | ORAL_TABLET | Freq: Four times a day (QID) | ORAL | Status: DC | PRN

## 2022-10-09 MED ORDER — MORPHINE SULFATE (PF) 2 MG/ML IV SOLN
0.5000 mg | INTRAVENOUS | Status: DC | PRN
  Administered 2022-10-09: 0.5 mg via INTRAVENOUS
  Filled 2022-10-09: qty 1

## 2022-10-09 MED ORDER — POLYETHYLENE GLYCOL 3350 17 G PO PACK: 17.0000 g | PACK | Freq: Every day | ORAL | Status: DC | PRN

## 2022-10-09 MED ORDER — ACETAMINOPHEN 650 MG RE SUPP: 650.0000 mg | Freq: Four times a day (QID) | RECTAL | Status: DC | PRN

## 2022-10-09 MED ORDER — HYDROCODONE-ACETAMINOPHEN 5-325 MG PO TABS
1.0000 | ORAL_TABLET | ORAL | Status: DC | PRN
  Administered 2022-10-11: 1 via ORAL
  Filled 2022-10-09: qty 1

## 2022-10-09 MED ORDER — LACTATED RINGERS IV BOLUS (SEPSIS)
500.0000 mL | Freq: Once | INTRAVENOUS | Status: AC
  Administered 2022-10-09: 500 mL via INTRAVENOUS

## 2022-10-09 MED ORDER — ONDANSETRON HCL 4 MG/2ML IJ SOLN: 4.0000 mg | Freq: Three times a day (TID) | INTRAMUSCULAR | Status: DC | PRN

## 2022-10-09 MED ORDER — PANTOPRAZOLE SODIUM 40 MG IV SOLR
40.0000 mg | Freq: Two times a day (BID) | INTRAVENOUS | Status: DC
  Administered 2022-10-10 – 2022-10-12 (×4): 40 mg via INTRAVENOUS
  Filled 2022-10-09 (×4): qty 10

## 2022-10-09 MED ORDER — LACTATED RINGERS IV BOLUS (SEPSIS)
1000.0000 mL | Freq: Once | INTRAVENOUS | Status: AC
  Administered 2022-10-09: 1000 mL via INTRAVENOUS

## 2022-10-09 MED ORDER — ALBUTEROL SULFATE (2.5 MG/3ML) 0.083% IN NEBU: 2.5000 mg | INHALATION_SOLUTION | RESPIRATORY_TRACT | Status: DC | PRN

## 2022-10-09 MED ORDER — FERROUS SULFATE 325 (65 FE) MG PO TABS
325.0000 mg | ORAL_TABLET | Freq: Every day | ORAL | Status: DC
  Administered 2022-10-12 – 2022-10-13 (×2): 325 mg via ORAL
  Filled 2022-10-09 (×2): qty 1

## 2022-10-09 MED ORDER — PIPERACILLIN-TAZOBACTAM 3.375 G IVPB 30 MIN
3.3750 g | Freq: Once | INTRAVENOUS | Status: AC
  Administered 2022-10-09: 3.375 g via INTRAVENOUS
  Filled 2022-10-09: qty 50

## 2022-10-09 MED ORDER — MIRTAZAPINE 15 MG PO TABS
15.0000 mg | ORAL_TABLET | Freq: Every day | ORAL | Status: DC
  Administered 2022-10-11 – 2022-10-12 (×2): 15 mg via ORAL
  Filled 2022-10-09 (×3): qty 1

## 2022-10-09 MED ORDER — OLANZAPINE 2.5 MG PO TABS
2.5000 mg | ORAL_TABLET | Freq: Every day | ORAL | Status: DC
  Administered 2022-10-11 – 2022-10-12 (×2): 2.5 mg via ORAL
  Filled 2022-10-09 (×3): qty 1

## 2022-10-09 MED ORDER — IOHEXOL 350 MG/ML SOLN
80.0000 mL | Freq: Once | INTRAVENOUS | Status: AC | PRN
  Administered 2022-10-09: 75 mL via INTRAVENOUS

## 2022-10-09 MED ORDER — SODIUM CHLORIDE 0.9 % IV SOLN: INTRAVENOUS | Status: DC

## 2022-10-09 MED ORDER — HYDRALAZINE HCL 20 MG/ML IJ SOLN: 5.0000 mg | INTRAMUSCULAR | Status: DC | PRN

## 2022-10-09 MED ORDER — HALOPERIDOL 0.5 MG PO TABS: 0.5000 mg | ORAL_TABLET | Freq: Three times a day (TID) | ORAL | Status: DC | PRN

## 2022-10-09 MED ORDER — SENNA 8.6 MG PO TABS
1.0000 | ORAL_TABLET | Freq: Two times a day (BID) | ORAL | Status: DC
  Administered 2022-10-11 – 2022-10-13 (×4): 8.6 mg via ORAL
  Filled 2022-10-09 (×5): qty 1

## 2022-10-09 MED ORDER — LACTULOSE ENEMA
300.0000 mL | Freq: Once | ORAL | Status: AC
  Administered 2022-10-09: 300 mL via RECTAL
  Filled 2022-10-09: qty 300

## 2022-10-09 MED ORDER — ADULT MULTIVITAMIN W/MINERALS CH
1.0000 | ORAL_TABLET | Freq: Every day | ORAL | Status: DC
  Administered 2022-10-12 – 2022-10-13 (×2): 1 via ORAL
  Filled 2022-10-09 (×2): qty 1

## 2022-10-09 MED ORDER — DM-GUAIFENESIN ER 30-600 MG PO TB12: 1.0000 | ORAL_TABLET | Freq: Two times a day (BID) | ORAL | Status: DC | PRN

## 2022-10-09 MED ORDER — EZETIMIBE 10 MG PO TABS
10.0000 mg | ORAL_TABLET | Freq: Every day | ORAL | Status: DC
  Administered 2022-10-12 – 2022-10-13 (×2): 10 mg via ORAL
  Filled 2022-10-09 (×2): qty 1

## 2022-10-09 MED ORDER — VANCOMYCIN HCL IN DEXTROSE 1-5 GM/200ML-% IV SOLN
1000.0000 mg | Freq: Once | INTRAVENOUS | Status: AC
  Administered 2022-10-09: 1000 mg via INTRAVENOUS
  Filled 2022-10-09: qty 200

## 2022-10-09 MED ORDER — VANCOMYCIN HCL 750 MG/150ML IV SOLN: 750.0000 mg | INTRAVENOUS | Status: DC

## 2022-10-09 NOTE — ED Triage Notes (Signed)
Pt from SNF to [facility] via [facility] w/ c/o Hematemesis x 3 days. EMS stated that her trash can in the room was full of black vomitus. VS enroute were BP 135/91, HR 120, SPO2 91% on 3L. CBG was 238.

## 2022-10-09 NOTE — Progress Notes (Signed)
Elink following code sepsis °

## 2022-10-09 NOTE — ED Provider Notes (Addendum)
[facility] Provider Note    Event Date/Time   First MD Initiated Contact with Patient 10/09/22 0701     (approximate)   History   Hematemesis   HPI  Kathleen Walls is a 82 y.o. female past medical history significant for dementia, hypertension, hyperlipidemia, CAD with prior PCI, prior CVA, GERD, crest syndrome, recent left hip fracture, who presents to the emergency department altered mental status and vomiting blood.  Patient was recently admitted to the hospital for a hip fracture and then discharged to a rehab facility.  On Friday went from rehab facility back to her nursing facility.  Friday had a meal and was able to eat.  Since that time has not been doing well with nausea vomiting.  Did not eat yesterday and had altered mental status.  Staff reported coffee-ground emesis.  Altered mental status.  Increased work of breathing.  Not on anticoagulation.  No known falls or trauma.  Patient's husband later arrived to the emergency department.  States that he last saw her in her normal state of health on Saturday.     Physical Exam   Triage Vital Signs: ED Triage Vitals  Enc Vitals Group     BP 10/09/22 0726 (!) 125/92     Pulse Rate 10/09/22 0701 (!) 121     Resp 10/09/22 0701 19     Temp 10/09/22 0726 97.9 F (36.6 C)     Temp Source 10/09/22 0726 Axillary     SpO2 10/09/22 0701 92 %     Weight 10/09/22 0702 85 lb (38.6 kg)     Height 10/09/22 0702 '5\' 3"'$  (1.6 m)     Head Circumference --      Peak Flow --      Pain Score --      Pain Loc --      Pain Edu? --      Excl. in [place]? --     Most recent vital signs: Vitals:   10/09/22 0726 10/09/22 0726  BP:  (!) 125/92  Pulse:    Resp:    Temp: 97.9 F (36.6 C)   SpO2:      Physical Exam Constitutional:      Appearance: She is well-developed.     Comments: Somnolent, responds to pain.  Small thin female  HENT:     Head: Atraumatic.     Comments: Dried blood on her mouth Eyes:      Conjunctiva/sclera: Conjunctivae normal.     Pupils: Pupils are equal, round, and reactive to light.  Cardiovascular:     Rate and Rhythm: Tachycardia present.  Pulmonary:     Effort: Respiratory distress present.     Comments: Tachypneic, coarse breath sounds, 3 L nasal cannula, 94% Abdominal:     General: There is no distension.     Tenderness: There is no abdominal tenderness.  Genitourinary:    Comments: No fecal impaction, brown stool, soft, no melena or blood Musculoskeletal:     Right lower leg: No edema.     Left lower leg: No edema.  Skin:    General: Skin is warm.     Capillary Refill: Capillary refill takes more than 3 seconds.  Neurological:     Mental Status: She is disoriented.     IMPRESSION / MDM / ASSESSMENT AND PLAN / ED COURSE  I reviewed the triage vital signs and the nursing notes.  Differential diagnosis including sepsis, GI bleed, pulmonary embolism, intracranial hemorrhage, mesenteric  ischemia  EKG  I, Nathaniel Man, the attending physician, personally viewed and interpreted this ECG.   Rate: 113  Rhythm: Sinus tachycardia  Axis: Normal  Intervals: Normal, QTc 490  ST&T Change: Diffuse depression, no ST elevation  No tachycardic or bradycardic dysrhythmias while on cardiac telemetry.  RADIOLOGY I independently reviewed imaging, my interpretation of imaging: CT head, CT abdomen and pelvis  LABS (all labs ordered are listed, but only abnormal results are displayed) Labs interpreted as -    Labs Reviewed  CBC WITH DIFFERENTIAL/PLATELET - Abnormal; Notable for the following components:      Result Value   WBC 25.0 (*)    RBC 5.30 (*)    MCH 25.8 (*)    RDW 16.0 (*)    Platelets 473 (*)    Neutro Abs 23.0 (*)    Lymphs Abs 0.4 (*)    Monocytes Absolute 1.2 (*)    Abs Immature Granulocytes 0.10 (*)    All other components within normal limits  COMPREHENSIVE METABOLIC PANEL - Abnormal; Notable for the following components:   Chloride 95  (*)    Glucose, Bld 219 (*)    BUN 37 (*)    Albumin 3.3 (*)    Alkaline Phosphatase 210 (*)    All other components within normal limits  LACTIC ACID, PLASMA - Abnormal; Notable for the following components:   Lactic Acid, Venous 4.5 (*)    All other components within normal limits  BLOOD GAS, VENOUS - Abnormal; Notable for the following components:   pO2, Ven <31 (*)    Bicarbonate 31.3 (*)    Acid-Base Excess 3.9 (*)    All other components within normal limits  URINALYSIS, W/ REFLEX TO CULTURE (INFECTION SUSPECTED) - Abnormal; Notable for the following components:   Color, Urine AMBER (*)    APPearance HAZY (*)    Ketones, ur 5 (*)    Protein, ur 30 (*)    All other components within normal limits  CBG MONITORING, ED - Abnormal; Notable for the following components:   Glucose-Capillary 188 (*)    All other components within normal limits  CULTURE, BLOOD (ROUTINE X 2)  CULTURE, BLOOD (ROUTINE X 2)  LIPASE, BLOOD  PROTIME-INR  APTT  LACTIC ACID, PLASMA  TYPE AND SCREEN    TREATMENT  2 L of LR, IV Zosyn, IV vancomycin, IV Protonix  MDM    Arrival patient tachycardic in clinic picture concerning for possible sepsis.  Blood cultures obtained.  Difficult IV stick so delay in obtaining blood cultures.  Initially felt 30 cc/kg of IV fluids may be detrimental to the patient given concern for anemia and hemodilution, given 500 cc bolus and will reevaluate.  On reevaluation continues to appear dehydrated.  Given another 1500 bolus of IV fluids total.  Patient received a total of more than 30 cc/kg of IV fluids.  Recent hospitalizations so we will cover for broad-spectrum with possible intra-abdominal source so started on vancomycin and Zosyn.  No significant anemia and significant leukocytosis.  Obtaining CT abdomen and pelvis with contrast, added on CT PE study given significant tachycardia and recent hip fracture, CT scan of the head obtained.    Goals of care discussed with  the husband, [name], patient is DNR.  Lactic acid significantly elevated at 4.5.  CT imaging concerning for esophagitis.  Also concerning for possible stool impaction however patient has no impaction on exam and had a bowel movement in the emergency department.  Questionable finding of  pneumonia.  Will treat the patient with antibiotics given significant leukocytosis and lactic acid elevation at 4.5.  Consulted and admitted to the hospitalist.  9:19 AM -sepsis reevaluation, will hold on any further fluids at this time.,  Normotensive.   PROCEDURES:  Critical Care performed: yes  .Critical Care  Performed by: Nathaniel Man, MD Authorized by: Nathaniel Man, MD   Critical care provider statement:    Critical care time (minutes):  40   Critical care time was exclusive of:  Separately billable procedures and treating other patients   Critical care was necessary to treat or prevent imminent or life-threatening deterioration of the following conditions:  Sepsis   Critical care was time spent personally by me on the following activities:  Development of treatment plan with patient or surrogate, discussions with consultants, evaluation of patient's response to treatment, examination of patient, ordering and review of laboratory studies, ordering and review of radiographic studies, ordering and performing treatments and interventions, pulse oximetry, re-evaluation of patient's condition and review of old charts   Patient's presentation is most consistent with acute presentation with potential threat to life or bodily function.   MEDICATIONS ORDERED IN ED: Medications  lactated ringers bolus 500 mL (500 mLs Intravenous New Bag/Given 10/09/22 0836)  vancomycin (VANCOCIN) IVPB 1000 mg/200 mL premix (1,000 mg Intravenous New Bag/Given 10/09/22 0904)  lactated ringers bolus 1,000 mL (0 mLs Intravenous Stopped 10/09/22 0901)  pantoprazole (PROTONIX) injection 40 mg (40 mg Intravenous Given 10/09/22 0754)   iohexol (OMNIPAQUE) 350 MG/ML injection 80 mL (75 mLs Intravenous Contrast Given 10/09/22 0817)  piperacillin-tazobactam (ZOSYN) IVPB 3.375 g (0 g Intravenous Stopped 10/09/22 0906)    FINAL CLINICAL IMPRESSION(S) / ED DIAGNOSES   Final diagnoses:  Aspiration pneumonia of right upper lobe, unspecified aspiration pneumonia type (HCC)  Esophagitis  Upper GI bleed  Lactic acidosis  Altered mental status, unspecified altered mental status type     Rx / DC Orders   ED Discharge Orders     None        Note:  This document was prepared using Dragon voice recognition software and may include unintentional dictation errors.   Nathaniel Man, MD 10/09/22 [id number]    Nathaniel Man, MD 10/09/22 [phone]

## 2022-10-09 NOTE — Consult Note (Signed)
Jonathon Bellows , MD [address] [place], [place], [place], [address] [address] [place], [place], [place], [address] Phone: [phone]  Fax: [phone]  Consultation  Referring Provider:   Dr Blaine Hamper  Primary Care Physician:  Bonnita Nasuti, MD Primary Gastroenterologist: [facility]       Reason for Consultation:    Hematemesis  Date of Admission:  10/09/2022 Date of Consultation:  10/09/2022         HPI:   Kathleen Walls is a 82 y.o. female with a past medical history of GERD, CVA, CAD, hip fracture . Was sent from rehab , nausea and vomiting , developed altered mental status , coffee ground emesis . Being treated for sepsis , on antibiotics, lactic acidosis.   CXR from today : shows pneumonia CT PE shows : consolidation from aspiration , esophagitis , possible dysmotility  Large amount of stool in the colon showing possible impaction   Went to the emergency room to see the patient discussed with her husband patient unable to give any history due to dementia apparently was less responsive and brought into the emergency room.  The patient states at home.  Husband only knew about her admission earlier today and came into the ER to see.  At baseline cannot have a conversation.  Past Medical History:  Diagnosis Date   Arthritis    Breast cancer [facility]) 2009    2009 breast cancer stage IIA T2 N0, ER positive, HER-2 negative, 2.5 cm primary tumor, poorly differentiated, status post wide local excision, one sentinel lymph node negative, high-risk Oncotype score 48   Cataracts, both eyes    Colitis    Complication of anesthesia    confusion   Coronary artery disease    Detached vitreous humor    Diverticulitis    Female bladder prolapse    Gastritis    GERD (gastroesophageal reflux disease)    Heart valve problem 06/19/2013   Leaking   Hemorrhoid    Hemorrhoids    History of kidney stones    Hyperlipemia    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 GRADE  3, STAGE 2   Kidney cysts    Left   Kidney stones    Lipoma of shoulder 2009   left   Migraine    Osteoarthritis    Osteopenia    Stented coronary artery    Stroke [facility])     Past Surgical History:  Procedure Laterality Date   ABDOMINAL HYSTERECTOMY     benign breast biopsy  2007   BREAST BIOPSY Left 2007   positive   BREAST CYST ASPIRATION Right    neg   BREAST LUMPECTOMY Left 2009    2009 breast cancer stage IIA T2 N0, ER positive, HER-2 negative, 2.5 cm primary tumor, poorly differentiated, status post wide local excision, one sentinel lymph node negative, high-risk Oncotype score 48   BREAST SURGERY     CARDIAC CATHETERIZATION N/A 06/22/2015   Procedure: Left Heart Cath and Coronary Angiography;  Surgeon: Teodoro Spray, MD;  Location: [facility];  Service: Cardiovascular;  Laterality: N/A;   CARDIAC CATHETERIZATION     CATARACT EXTRACTION     COLONOSCOPY     CORONARY ANGIOPLASTY     ENDARTERECTOMY Right 12/26/2017   Procedure: ENDARTERECTOMY CAROTID;  Surgeon: Katha Cabal, MD;  Location: [facility];  Service: Vascular;  Laterality: Right;   EYE SURGERY     FOOT  FRACTURE SURGERY Left    INTRAMEDULLARY (IM) NAIL INTERTROCHANTERIC Left 09/11/2022   Procedure: INTRAMEDULLARY (IM) NAIL INTERTROCHANTERIC;  Surgeon: Earnestine Leys, MD;  Location: [facility];  Service: Orthopedics;  Laterality: Left;   PARTIAL HYSTERECTOMY     UPPER GASTROINTESTINAL ENDOSCOPY      Prior to Admission medications   Medication Sig Start Date End Date Taking? Authorizing Provider  acetaminophen (TYLENOL) 325 MG tablet Take 650 mg by mouth every 4 (four) hours as needed for mild pain.    [provider]  amLODipine (NORVASC) 2.5 MG tablet Take 2.5 mg by mouth daily. 02/12/18 09/10/22  [provider]  aspirin EC 81 MG tablet Take 1 tablet (81 mg total) by mouth 2 (two) times daily. For 6 weeks and then change to daily 09/15/22   [name], MD  bisacodyl (DULCOLAX) 10 MG  suppository Place 1 suppository (10 mg total) rectally daily as needed for moderate constipation. 09/15/22   [name], MD  ezetimibe (ZETIA) 10 MG tablet Take 10 mg by mouth daily.  03/01/15   [provider]  ferrous sulfate 325 (65 FE) MG tablet Take 1 tablet (325 mg total) by mouth daily with breakfast. 09/15/22   [name], MD  haloperidol (HALDOL) 0.5 MG tablet Take 0.5 mg by mouth every 8 (eight) hours as needed for agitation. 02/01/22   [provider]  HYDROcodone-acetaminophen (NORCO/VICODIN) 5-325 MG tablet Take 1 tablet by mouth every 4 (four) hours as needed for moderate pain or severe pain (half a tab for moderate pain, 1 tab for severe pain.). 09/15/22   [name], MD  hydroxychloroquine (PLAQUENIL) 200 MG tablet Take 200 mg by mouth daily. 04/22/19   [provider]  memantine (NAMENDA) 5 MG tablet Take 5 mg by mouth 2 (two) times daily.    [provider]  metoprolol succinate (TOPROL-XL) 25 MG 24 hr tablet Take 12.5 mg by mouth daily. 09/06/15   [provider]  mirtazapine (REMERON) 15 MG tablet Take 15 mg by mouth at bedtime. 04/18/22   [provider]  Multiple Vitamin (MULTIVITAMIN WITH MINERALS) TABS tablet Take 1 tablet by mouth daily. 09/15/22   [name], MD  OLANZapine (ZYPREXA) 2.5 MG tablet Take 1 tablet (2.5 mg total) by mouth at bedtime. 02/02/22 02/02/23  [name], MD  omeprazole (PRILOSEC) 20 MG capsule Take 20 mg by mouth 2 (two) times daily before a meal.    [provider]  senna (SENOKOT) 8.6 MG TABS tablet Take 1 tablet (8.6 mg total) by mouth 2 (two) times daily. 09/15/22   [name], MD  Venlafaxine HCl 75 MG TB24 Take 75 mg by mouth daily. 01/14/22   [provider]    Family History  Problem Relation Age of Onset   Heart disease Mother    Diabetes Father    Heart disease Father    Diabetes Sister    Diabetes Brother    Cervical cancer Cousin    Breast cancer Cousin    Lung cancer  Paternal Aunt    Prostate cancer Maternal Uncle    Leukemia Other    Hypertension Other      Social History   Tobacco Use   Smoking status: Former    Types: Cigarettes    Quit date: 08/14/1993    Years since quitting: 29.1   Smokeless tobacco: Never  Vaping Use   Vaping Use: Never used  Substance Use Topics   Alcohol use: Not Currently  Alcohol/week: 0.0 standard drinks of alcohol    Comment: rare   Drug use: No    Allergies as of 10/09/2022 - Review Complete 10/09/2022  Allergen Reaction Noted   Alendronate sodium Other (See Comments) 03/02/2015   Atorvastatin Other (See Comments) 03/02/2015   Azithromycin Hives 03/02/2015   Ciprofloxacin Other (See Comments) 03/02/2015   Colestipol Other (See Comments) 03/02/2015   Lidocaine Hives 03/02/2015   Lovastatin Other (See Comments) 03/02/2015   Metronidazole Other (See Comments) 03/02/2015   Niacin Other (See Comments) 03/02/2015   Risedronate sodium Other (See Comments) 03/02/2015   Rosuvastatin Other (See Comments) 03/02/2015   Simvastatin Other (See Comments) 03/02/2015    Review of Systems:    Unable to do review of systems due to dementia   Physical Exam:  Vital signs in last 24 hours: Temp:  [97.9 F (36.6 C)] 97.9 F (36.6 C) (02/26 0726) Pulse Rate:  [121] 121 (02/26 0701) Resp:  [19] 19 (02/26 0701) BP: (125)/(92) 125/92 (02/26 0726) SpO2:  [92 %] 92 % (02/26 0701) Weight:  [34.5 kg-38.6 kg] 34.5 kg (02/26 0748)   General:   Appears comfortable not able to communicate Head:  Normocephalic and atraumatic. Eyes:   No icterus.   Conjunctiva pink. PERRLA. Ears: Cannot assess Neck:  Supple; no masses or thyroidomegaly Lungs: Respirations even and unlabored. Lungs clear to auscultation bilaterally.   No wheezes, crackles, or rhonchi.  Heart:  Regular rate and rhythm;  Without murmur, clicks, rubs or gallops Abdomen:  Soft, nondistended, nontender. Normal bowel sounds. No appreciable masses or hepatomegaly.   No rebound or guarding.  Neurologic:  Alert and oriented x 0 Skin:  Intact without significant lesions or rashes. Cervical Nodes:  No significant cervical adenopathy. Psych: Appears comfortable not in distress but unable to communicate  LAB RESULTS: Recent Labs    10/09/22 0723  WBC 25.0*  HGB 13.7  HCT 44.9  PLT 473*   BMET Recent Labs    10/09/22 0723  NA 138  K 3.7  CL 95*  CO2 28  GLUCOSE 219*  BUN 37*  CREATININE 0.90  CALCIUM 9.5   LFT Recent Labs    10/09/22 0723  PROT 7.1  ALBUMIN 3.3*  AST 35  ALT 24  ALKPHOS 210*  BILITOT 0.5   PT/INR Recent Labs    10/09/22 0723  LABPROT 13.9  INR 1.1    STUDIES: DG Chest Port 1 View  Result Date: 10/09/2022 CLINICAL DATA:  Possible sepsis. EXAM: PORTABLE CHEST 1 VIEW COMPARISON:  09/10/2022 FINDINGS: Lungs are hyperexpanded. New airspace disease noted parahilar right lung, suspicious for pneumonia, better evaluated on recent chest CTA. Focal opacity at the right cardio phrenic angle is compatible with the probable pericardial cyst seen on recent CT imaging. The cardiopericardial silhouette is within normal limits for size. The visualized bony structures of the thorax are unremarkable. Telemetry leads overlie the chest. IMPRESSION: New airspace disease in the parahilar right lung, suspicious for pneumonia, better evaluated on recent chest CTA. Electronically Signed   By: Misty Stanley M.D.   On: 10/09/2022 08:59   CT Angio Chest PE W and/or Wo Contrast  Result Date: 10/09/2022 CLINICAL DATA:  Hematemesis for 3 days. EXAM: CT ANGIOGRAPHY CHEST WITH CONTRAST TECHNIQUE: Multidetector CT imaging of the chest was performed using the standard protocol during bolus administration of intravenous contrast. Multiplanar CT image reconstructions and MIPs were obtained to evaluate the vascular anatomy. RADIATION DOSE REDUCTION: This exam was performed according to the departmental dose-optimization  program which includes automated  exposure control, adjustment of the mA and/or kV according to patient size and/or use of iterative reconstruction technique. CONTRAST:  28m OMNIPAQUE IOHEXOL 350 MG/ML SOLN COMPARISON:  09/10/2022 chest radiograph.  Chest CT of 07/21/2015. FINDINGS: Cardiovascular: The quality of this exam for evaluation of pulmonary embolism is moderate. Limitations include patient arm position, not raised above the head and mildly suboptimal bolus timing, centered in the aorta. No central lobar, or large segmental pulmonary embolism identified. Aortic atherosclerosis. Tortuous thoracic aorta. Mild cardiomegaly, without pericardial effusion. Three vessel coronary artery calcification. Mediastinum/Nodes: No mediastinal or hilar adenopathy. The distal esophagus is mildly dilated and thick walled, including on 107/4. Fluid-filled more superiorly, including on 66/4. Cystic lesion within the right cardiophrenic angle measures 4.3 x 2.8 cm on 111/4 and is relatively similar to on the prior. Lungs/Pleura: No pleural fluid. Mild biapical pleuroparenchymal scarring. Posterior right upper lobe nodular airspace disease including on 48 and 52 of series 6. Clustered right lower lobe tree-in-bud nodules are similar, including on 81/6. A pleural-based right lower lobe 5 mm nodule on 72/6 is unchanged and considered benign. 4-5 mm left lower lobe subpleural pulmonary nodule on 84/6 is also unchanged and considered benign. Upper Abdomen: Hepatic steatosis. Normal imaged portions of the spleen, pancreas, gallbladder, right adrenal gland. Left adrenal thickening. Bilateral renal sinus cysts. Interpolar left renal too small to characterize lesion is most likely a cyst at 4 mm . In the absence of clinically indicated signs/symptoms require(s) no independent follow-up. Musculoskeletal: Nonspecific left breast calcifications of up to 9 mm. T11 moderate compression deformity is new since the prior with minimal ventral canal encroachment. Review of the  MIP images confirms the above findings. IMPRESSION: 1. Multifactorial degradation, as detailed above. No pulmonary embolism to the large segmental level. 2. Posterior right upper lobe nodular consolidation could represent aspiration or infection. 3. Lower esophageal wall thickening suggests esophagitis. Dilated, fluid-filled mid and upper esophagus suggests dysmotility and/or gastroesophageal reflux and likely predisposes patient to aspiration. 4. Hepatic steatosis 5. Similar right sided pericardial cyst. 6. Interval T11 compression deformity, favored to be nonacute. This could be correlated with point tenderness. 7. Coronary artery atherosclerosis. Aortic Atherosclerosis (ICD10-I70.0). Electronically Signed   By: KAbigail MiyamotoM.D.   On: 10/09/2022 08:51   CT ABDOMEN PELVIS W CONTRAST  Result Date: 10/09/2022 CLINICAL DATA:  Hematemesis. EXAM: CT ABDOMEN AND PELVIS WITH CONTRAST TECHNIQUE: Multidetector CT imaging of the abdomen and pelvis was performed using the standard protocol following bolus administration of intravenous contrast. RADIATION DOSE REDUCTION: This exam was performed according to the departmental dose-optimization program which includes automated exposure control, adjustment of the mA and/or kV according to patient size and/or use of iterative reconstruction technique. CONTRAST:  729mOMNIPAQUE IOHEXOL 350 MG/ML SOLN COMPARISON:  August 21, 2016. FINDINGS: Lower chest: Visualized lung bases are unremarkable. Wall thickening of distal esophagus is noted. Hepatobiliary: Gallbladder distention is noted without cholelithiasis. No biliary dilatation is noted. The liver is unremarkable. Pancreas: Unremarkable. No pancreatic ductal dilatation or surrounding inflammatory changes. Spleen: Normal in size without focal abnormality. Adrenals/Urinary Tract: Adrenal glands are unremarkable. Kidneys are normal, without renal calculi, focal lesion, or hydronephrosis. Bladder is unremarkable. Stomach/Bowel:  Stomach is unremarkable. There is no evidence of bowel obstruction or inflammation. Large amount of stool seen in the rectum concerning for impaction. Vascular/Lymphatic: Aortic atherosclerosis. No enlarged abdominal or pelvic lymph nodes. Reproductive: Status post hysterectomy. No adnexal masses. Other: No abdominal wall hernia or abnormality. No abdominopelvic ascites. Musculoskeletal:  Status post surgical internal fixation of proximal left femoral fracture. No acute osseous abnormality is noted. Old T11 fracture is noted. IMPRESSION: Wall thickening of visualized distal esophagus is noted suggesting esophagitis. Endoscopy is recommended for further evaluation. Gallbladder distention without evidence of cholecystitis or inflammation. Large amount of stool seen in the rectum concerning for impaction. Aortic Atherosclerosis (ICD10-I70.0). Electronically Signed   By: Marijo Conception M.D.   On: 10/09/2022 08:42   CT Head Wo Contrast  Result Date: 10/09/2022 CLINICAL DATA:  Mental status change.  Hematemesis for 3 days. EXAM: CT HEAD WITHOUT CONTRAST TECHNIQUE: Contiguous axial images were obtained from the base of the skull through the vertex without intravenous contrast. RADIATION DOSE REDUCTION: This exam was performed according to the departmental dose-optimization program which includes automated exposure control, adjustment of the mA and/or kV according to patient size and/or use of iterative reconstruction technique. COMPARISON:  09/10/2022 FINDINGS: Brain: No evidence of acute infarction, hemorrhage, hydrocephalus, extra-axial collection or mass lesion/mass effect. Remote right frontal convexity infarct is unchanged compared with the previous exam, image 22/3. Vascular: No hyperdense vessel or unexpected calcification. Skull: Normal. Negative for fracture or focal lesion. Sinuses/Orbits: The paranasal sinuses and mastoid air cells are clear Other: None. IMPRESSION: 1. No acute intracranial abnormalities. 2.  Remote right frontal convexity infarct. Electronically Signed   By: Kerby Moors M.D.   On: 10/09/2022 08:41      Impression / Plan:   IN SHIDLER is a 82 y.o. y/o female with dementia admitted with severe sepsis , aspiration, nausea vomiting.  At baseline patient unable to provide any information and requires help for all activities of daily living   Plan  IV PPI  Monitor CBC and trasnfuse as needed Unless patient has a hemorrohage or significant bleed would not perform EGD at this time due to pneumonia as it increases complications from anesthesia, would have to wait for a week atleast if not longer. Will need  an EGD [place] if has a severe bleed where we have no other option and benefit > risk .  Keep head end of the bed elevated at 45 degrees Patient appears to have advanced dementia suggest discussing goals of care.   Thank you for involving me in the care of this patient.      LOS: 0 days   Jonathon Bellows, MD  10/09/2022, 10:56 AM

## 2022-10-09 NOTE — Progress Notes (Signed)
CODE SEPSIS - PHARMACY COMMUNICATION  **Broad Spectrum Antibiotics should be administered within 1 hour of Sepsis diagnosis**  Time Code Sepsis Called/Page Received: 0829  Antibiotics Ordered: Zosyn, Vancomycin  Time of 1st antibiotic administration: [phone]  Additional action taken by pharmacy: None  If necessary, Name of Provider/Nurse Contacted: None   Will M. Ouida Sills, PharmD PGY-1 Pharmacy Resident 10/09/2022 9:08 AM

## 2022-10-09 NOTE — H&P (Signed)
History and Physical    Kathleen Walls [id number] DOB: 23-Jan-1941 DOA: 10/09/2022  Referring MD/NP/PA:   PCP: Bonnita Nasuti, MD   Patient coming from:  The patient is coming from SNF     Chief Complaint: Hematemesis and AMS  HPI: Kathleen Walls is a 82 y.o. female with medical history significant of dementia, hypertension, hyperlipidemia, prediabetes, CAD with stent placement, stroke/TIA, GERD, dCHF, severe protein calorie malnutrition, depression with anxiety, kidney stone, breast cancer, diverticulitis, CREST syndrome, who presents with hematemesis and AMS.   Patient was recently hospitalized from 1/28 - 2/2 due to left hip fracture.  Patient finished rehab after surgery and went back to SNF on Friday. Per husband at bedside, pt was found to have hematemesis with coffee-ground material since this morning.  No active diarrhea.  Not sure if patient has abdominal pain.  Patient has mild dry cough, no respiratory distress, not sure if patient has chest pain.  No fever.  She moves all extremities.  No facial droop.  Not sure patient has symptoms of UTI.  At her normal baseline, patient cannot recognize her husband, but is not orientated to place and time.  Currently patient is more confused, not able to recognize her husband.  Not oriented to the place and time.  Data reviewed independently and ED Course: pt was found to have Hgb 13.7 (9.3 on 09/14/22), lactic acid 4.5, WBC 25.0, GFR> 60.  Temperature normal, blood pressure 125/92, heart rate 121, RR 19, oxygen saturation 92% on 2 L oxygen. CT scan of abdomen/pelvis that showed esophagitis.  CTA negative for PE, but showed RUL infiltration.  CT head negative.  Patient is admitted to PCU as inpatient.   CTA: 1. Multifactorial degradation, as detailed above. No pulmonary embolism to the large segmental level. 2. Posterior right upper lobe nodular consolidation could represent aspiration or infection. 3. Lower esophageal wall thickening  suggests esophagitis. Dilated, fluid-filled mid and upper esophagus suggests dysmotility and/or gastroesophageal reflux and likely predisposes patient to aspiration. 4. Hepatic steatosis 5. Similar right sided pericardial cyst. 6. Interval T11 compression deformity, favored to be nonacute. This could be correlated with point tenderness. 7. Coronary artery atherosclerosis. Aortic Atherosclerosis (ICD10-I70.0).   CT-abd/pelvis: Wall thickening of visualized distal esophagus is noted suggesting esophagitis. Endoscopy is recommended for further evaluation.   Gallbladder distention without evidence of cholecystitis or inflammation.   Large amount of stool seen in the rectum concerning for impaction.   Aortic Atherosclerosis (ICD10-I70.0)    EKG: I have personally reviewed.  Sinus rhythm, QTc 436, bilateral atrial enlargement, low voltage.   Review of Systems: Could not be reviewed due to altered mental status and dementia  Allergy:  Allergies  Allergen Reactions   Alendronate Sodium Other (See Comments)    Unknown   Atorvastatin Other (See Comments)    Muscle Pain   Azithromycin Hives   Ciprofloxacin Other (See Comments)    Headache   Colestipol Other (See Comments)    Unknown   Lidocaine Hives   Lovastatin Other (See Comments)    Headache   Metronidazole Other (See Comments)    Headache   Niacin Other (See Comments)    Unknown   Risedronate Sodium Other (See Comments)    Unknown   Rosuvastatin Other (See Comments)    Unknown   Simvastatin Other (See Comments)    Headache    Past Medical History:  Diagnosis Date   Arthritis    Breast cancer [facility]) 2009    2009 breast  cancer stage IIA T2 N0, ER positive, HER-2 negative, 2.5 cm primary tumor, poorly differentiated, status post wide local excision, one sentinel lymph node negative, high-risk Oncotype score 48   Cataracts, both eyes    Colitis    Complication of anesthesia    confusion   Coronary artery disease     Detached vitreous humor    Diverticulitis    Female bladder prolapse    Gastritis    GERD (gastroesophageal reflux disease)    Heart valve problem 06/19/2013   Leaking   Hemorrhoid    Hemorrhoids    History of kidney stones    Hyperlipemia    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 POSITIVE 2.5CM GRADE 3, STAGE 2   Kidney cysts    Left   Kidney stones    Lipoma of shoulder 2009   left   Migraine    Osteoarthritis    Osteopenia    Stented coronary artery    Stroke [facility])     Past Surgical History:  Procedure Laterality Date   ABDOMINAL HYSTERECTOMY     benign breast biopsy  2007   BREAST BIOPSY Left 2007   positive   BREAST CYST ASPIRATION Right    neg   BREAST LUMPECTOMY Left 2009    2009 breast cancer stage IIA T2 N0, ER positive, HER-2 negative, 2.5 cm primary tumor, poorly differentiated, status post wide local excision, one sentinel lymph node negative, high-risk Oncotype score 48   BREAST SURGERY     CARDIAC CATHETERIZATION N/A 06/22/2015   Procedure: Left Heart Cath and Coronary Angiography;  Surgeon: Teodoro Spray, MD;  Location: [place] [facility];  Service: Cardiovascular;  Laterality: N/A;   CARDIAC CATHETERIZATION     CATARACT EXTRACTION     COLONOSCOPY     CORONARY ANGIOPLASTY     ENDARTERECTOMY Right 12/26/2017   Procedure: ENDARTERECTOMY CAROTID;  Surgeon: Katha Cabal, MD;  Location: [facility] OR[facility];  Service: Vascular;  Laterality: Right;   EYE SURGERY     FOOT FRACTURE SURGERY Left    INTRAMEDULLARY (IM) NAIL INTERTROCHANTERIC Left 09/11/2022   Procedure: INTRAMEDULLARY (IM) NAIL INTERTROCHANTERIC;  Surgeon: Earnestine Leys, MD;  Location: [facility] ORS;  Service: Orthopedics;  Laterality: Left;   PARTIAL HYSTERECTOMY     UPPER GASTROINTESTINAL ENDOSCOPY      Social History:  reports that she quit smoking about 29 years ago. Her smoking use included cigarettes. She has never used smokeless tobacco. She reports that she does not currently use  alcohol. She reports that she does not use drugs.  Family History:  Family History  Problem Relation Age of Onset   Heart disease Mother    Diabetes Father    Heart disease Father    Diabetes Sister    Diabetes Brother    Cervical cancer Cousin    Breast cancer Cousin    Lung cancer Paternal Aunt    Prostate cancer Maternal Uncle    Leukemia Other    Hypertension Other      Prior to Admission medications   Medication Sig Start Date End Date Taking? Authorizing Provider  acetaminophen (TYLENOL) 325 MG tablet Take 650 mg by mouth every 4 (four) hours as needed for mild pain.    [provider]  amLODipine (NORVASC) 2.5 MG tablet Take 2.5 mg by mouth daily. 02/12/18 09/10/22  [provider]  aspirin EC 81 MG tablet Take 1 tablet (81 mg total) by mouth 2 (two) times daily. For  6 weeks and then change to daily 09/15/22   [name], MD  bisacodyl (DULCOLAX) 10 MG suppository Place 1 suppository (10 mg total) rectally daily as needed for moderate constipation. 09/15/22   [name], MD  ezetimibe (ZETIA) 10 MG tablet Take 10 mg by mouth daily.  03/01/15   [provider]  ferrous sulfate 325 (65 FE) MG tablet Take 1 tablet (325 mg total) by mouth daily with breakfast. 09/15/22   [name], MD  haloperidol (HALDOL) 0.5 MG tablet Take 0.5 mg by mouth every 8 (eight) hours as needed for agitation. 02/01/22   [provider]  HYDROcodone-acetaminophen (NORCO/VICODIN) 5-325 MG tablet Take 1 tablet by mouth every 4 (four) hours as needed for moderate pain or severe pain (half a tab for moderate pain, 1 tab for severe pain.). 09/15/22   [name], MD  hydroxychloroquine (PLAQUENIL) 200 MG tablet Take 200 mg by mouth daily. 04/22/19   [provider]  memantine (NAMENDA) 5 MG tablet Take 5 mg by mouth 2 (two) times daily.    [provider]  metoprolol succinate (TOPROL-XL) 25 MG 24 hr tablet Take 12.5 mg by mouth daily. 09/06/15   [provider]   mirtazapine (REMERON) 15 MG tablet Take 15 mg by mouth at bedtime. 04/18/22   [provider]  Multiple Vitamin (MULTIVITAMIN WITH MINERALS) TABS tablet Take 1 tablet by mouth daily. 09/15/22   [name], MD  OLANZapine (ZYPREXA) 2.5 MG tablet Take 1 tablet (2.5 mg total) by mouth at bedtime. 02/02/22 02/02/23  [name], MD  omeprazole (PRILOSEC) 20 MG capsule Take 20 mg by mouth 2 (two) times daily before a meal.    [provider]  senna (SENOKOT) 8.6 MG TABS tablet Take 1 tablet (8.6 mg total) by mouth 2 (two) times daily. 09/15/22   [name], MD  Venlafaxine HCl 75 MG TB24 Take 75 mg by mouth daily. 01/14/22   [provider]    Physical Exam: Vitals:   10/09/22 [id number] 10/09/22 0726 10/09/22 0726 10/09/22 0748  BP:   (!) 125/92   Pulse:      Resp:      Temp:  97.9 F (36.6 C)    TempSrc:  Axillary    SpO2:      Weight: 38.6 kg   34.5 kg  Height: '5\' 3"'$  (1.6 m)      General: Not in acute distress.  Thin body habitus HEENT:       Eyes: PERRL, EOMI, no scleral icterus.       ENT: No discharge from the ears and nose       Neck: No JVD, no bruit, no mass felt. Heme: No neck lymph node enlargement. Cardiac: S1/S2, RRR, No murmurs, No gallops or rubs. Respiratory: No rales, wheezing, rhonchi or rubs. GI: Soft, nondistended, nontender, no organomegaly, BS present. GU: No hematuria Ext: No pitting leg edema bilaterally. 1+DP/PT pulse bilaterally. Musculoskeletal: No joint deformities, No joint redness or warmth, no limitation of ROM in spin. Skin: No rashes.  Neuro: Has altered mental status, not following command, barely arousable, not oriented X3, cranial nerves II-XII grossly intact, moves all extremities from painful stimuli Psych: Patient is not psychotic, no suicidal or hemocidal ideation.  Labs on Admission: I have personally reviewed following labs and imaging studies  CBC: Recent Labs  Lab 10/09/22 0723  WBC 25.0*  NEUTROABS 23.0*  HGB 13.7   HCT 44.9  MCV 84.7  PLT 473*   Basic  Metabolic Panel: Recent Labs  Lab 10/09/22 0723  NA 138  K 3.7  CL 95*  CO2 28  GLUCOSE 219*  BUN 37*  CREATININE 0.90  CALCIUM 9.5   GFR: Estimated Creatinine Clearance: 26.7 mL/min (by C-G formula based on SCr of 0.9 mg/dL). Liver Function Tests: Recent Labs  Lab 10/09/22 0723  AST 35  ALT 24  ALKPHOS 210*  BILITOT 0.5  PROT 7.1  ALBUMIN 3.3*   Recent Labs  Lab 10/09/22 0723  LIPASE 26   No results for input(s): "AMMONIA" in the last 168 hours. Coagulation Profile: Recent Labs  Lab 10/09/22 0723  INR 1.1   Cardiac Enzymes: No results for input(s): "CKTOTAL", "CKMB", "CKMBINDEX", "TROPONINI" in the last 168 hours. BNP (last 3 results) No results for input(s): "PROBNP" in the last 8760 hours. HbA1C: No results for input(s): "HGBA1C" in the last 72 hours. CBG: Recent Labs  Lab 10/09/22 0659  GLUCAP 188*   Lipid Profile: No results for input(s): "CHOL", "HDL", "LDLCALC", "TRIG", "CHOLHDL", "LDLDIRECT" in the last 72 hours. Thyroid Function Tests: No results for input(s): "TSH", "T4TOTAL", "FREET4", "T3FREE", "THYROIDAB" in the last 72 hours. Anemia Panel: No results for input(s): "VITAMINB12", "FOLATE", "FERRITIN", "TIBC", "IRON", "RETICCTPCT" in the last 72 hours. Urine analysis:    Component Value Date/Time   COLORURINE AMBER (A) 10/09/2022 0723   APPEARANCEUR HAZY (A) 10/09/2022 0723   LABSPEC 1.027 10/09/2022 0723   PHURINE 5.0 10/09/2022 0723   GLUCOSEU NEGATIVE 10/09/2022 0723   HGBUR NEGATIVE 10/09/2022 0723   BILIRUBINUR NEGATIVE 10/09/2022 0723   KETONESUR 5 (A) 10/09/2022 0723   PROTEINUR 30 (A) 10/09/2022 0723   NITRITE NEGATIVE 10/09/2022 0723   LEUKOCYTESUR NEGATIVE 10/09/2022 0723   Sepsis Labs: '@LABRCNTIP'$ (procalcitonin:4,lacticidven:4) )No results found for this or any previous visit (from the past 240 hour(s)).   Radiological Exams on Admission: DG Chest Port 1 View  Result Date:  10/09/2022 CLINICAL DATA:  Possible sepsis. EXAM: PORTABLE CHEST 1 VIEW COMPARISON:  09/10/2022 FINDINGS: Lungs are hyperexpanded. New airspace disease noted parahilar right lung, suspicious for pneumonia, better evaluated on recent chest CTA. Focal opacity at the right cardio phrenic angle is compatible with the probable pericardial cyst seen on recent CT imaging. The cardiopericardial silhouette is within normal limits for size. The visualized bony structures of the thorax are unremarkable. Telemetry leads overlie the chest. IMPRESSION: New airspace disease in the parahilar right lung, suspicious for pneumonia, better evaluated on recent chest CTA. Electronically Signed   By: Misty Stanley M.D.   On: 10/09/2022 08:59   CT Angio Chest PE W and/or Wo Contrast  Result Date: 10/09/2022 CLINICAL DATA:  Hematemesis for 3 days. EXAM: CT ANGIOGRAPHY CHEST WITH CONTRAST TECHNIQUE: Multidetector CT imaging of the chest was performed using the standard protocol during bolus administration of intravenous contrast. Multiplanar CT image reconstructions and MIPs were obtained to evaluate the vascular anatomy. RADIATION DOSE REDUCTION: This exam was performed according to the departmental dose-optimization program which includes automated exposure control, adjustment of the mA and/or kV according to patient size and/or use of iterative reconstruction technique. CONTRAST:  53m OMNIPAQUE IOHEXOL 350 MG/ML SOLN COMPARISON:  09/10/2022 chest radiograph.  Chest CT of 07/21/2015. FINDINGS: Cardiovascular: The quality of this exam for evaluation of pulmonary embolism is moderate. Limitations include patient arm position, not raised above the head and mildly suboptimal bolus timing, centered in the aorta. No central lobar, or large segmental pulmonary embolism identified. Aortic atherosclerosis. Tortuous thoracic aorta. Mild cardiomegaly, without pericardial effusion. Three  vessel coronary artery calcification. Mediastinum/Nodes: No  mediastinal or hilar adenopathy. The distal esophagus is mildly dilated and thick walled, including on 107/4. Fluid-filled more superiorly, including on 66/4. Cystic lesion within the right cardiophrenic angle measures 4.3 x 2.8 cm on 111/4 and is relatively similar to on the prior. Lungs/Pleura: No pleural fluid. Mild biapical pleuroparenchymal scarring. Posterior right upper lobe nodular airspace disease including on 48 and 52 of series 6. Clustered right lower lobe tree-in-bud nodules are similar, including on 81/6. A pleural-based right lower lobe 5 mm nodule on 72/6 is unchanged and considered benign. 4-5 mm left lower lobe subpleural pulmonary nodule on 84/6 is also unchanged and considered benign. Upper Abdomen: Hepatic steatosis. Normal imaged portions of the spleen, pancreas, gallbladder, right adrenal gland. Left adrenal thickening. Bilateral renal sinus cysts. Interpolar left renal too small to characterize lesion is most likely a cyst at 4 mm . In the absence of clinically indicated signs/symptoms require(s) no independent follow-up. Musculoskeletal: Nonspecific left breast calcifications of up to 9 mm. T11 moderate compression deformity is new since the prior with minimal ventral canal encroachment. Review of the MIP images confirms the above findings. IMPRESSION: 1. Multifactorial degradation, as detailed above. No pulmonary embolism to the large segmental level. 2. Posterior right upper lobe nodular consolidation could represent aspiration or infection. 3. Lower esophageal wall thickening suggests esophagitis. Dilated, fluid-filled mid and upper esophagus suggests dysmotility and/or gastroesophageal reflux and likely predisposes patient to aspiration. 4. Hepatic steatosis 5. Similar right sided pericardial cyst. 6. Interval T11 compression deformity, favored to be nonacute. This could be correlated with point tenderness. 7. Coronary artery atherosclerosis. Aortic Atherosclerosis (ICD10-I70.0).  Electronically Signed   By: Abigail Miyamoto M.D.   On: 10/09/2022 08:51   CT ABDOMEN PELVIS W CONTRAST  Result Date: 10/09/2022 CLINICAL DATA:  Hematemesis. EXAM: CT ABDOMEN AND PELVIS WITH CONTRAST TECHNIQUE: Multidetector CT imaging of the abdomen and pelvis was performed using the standard protocol following bolus administration of intravenous contrast. RADIATION DOSE REDUCTION: This exam was performed according to the departmental dose-optimization program which includes automated exposure control, adjustment of the mA and/or kV according to patient size and/or use of iterative reconstruction technique. CONTRAST:  70m OMNIPAQUE IOHEXOL 350 MG/ML SOLN COMPARISON:  August 21, 2016. FINDINGS: Lower chest: Visualized lung bases are unremarkable. Wall thickening of distal esophagus is noted. Hepatobiliary: Gallbladder distention is noted without cholelithiasis. No biliary dilatation is noted. The liver is unremarkable. Pancreas: Unremarkable. No pancreatic ductal dilatation or surrounding inflammatory changes. Spleen: Normal in size without focal abnormality. Adrenals/Urinary Tract: Adrenal glands are unremarkable. Kidneys are normal, without renal calculi, focal lesion, or hydronephrosis. Bladder is unremarkable. Stomach/Bowel: Stomach is unremarkable. There is no evidence of bowel obstruction or inflammation. Large amount of stool seen in the rectum concerning for impaction. Vascular/Lymphatic: Aortic atherosclerosis. No enlarged abdominal or pelvic lymph nodes. Reproductive: Status post hysterectomy. No adnexal masses. Other: No abdominal wall hernia or abnormality. No abdominopelvic ascites. Musculoskeletal: Status post surgical internal fixation of proximal left femoral fracture. No acute osseous abnormality is noted. Old T11 fracture is noted. IMPRESSION: Wall thickening of visualized distal esophagus is noted suggesting esophagitis. Endoscopy is recommended for further evaluation. Gallbladder distention  without evidence of cholecystitis or inflammation. Large amount of stool seen in the rectum concerning for impaction. Aortic Atherosclerosis (ICD10-I70.0). Electronically Signed   By: JMarijo ConceptionM.D.   On: 10/09/2022 08:42   CT Head Wo Contrast  Result Date: 10/09/2022 CLINICAL DATA:  Mental status change.  Hematemesis  for 3 days. EXAM: CT HEAD WITHOUT CONTRAST TECHNIQUE: Contiguous axial images were obtained from the base of the skull through the vertex without intravenous contrast. RADIATION DOSE REDUCTION: This exam was performed according to the departmental dose-optimization program which includes automated exposure control, adjustment of the mA and/or kV according to patient size and/or use of iterative reconstruction technique. COMPARISON:  09/10/2022 FINDINGS: Brain: No evidence of acute infarction, hemorrhage, hydrocephalus, extra-axial collection or mass lesion/mass effect. Remote right frontal convexity infarct is unchanged compared with the previous exam, image 22/3. Vascular: No hyperdense vessel or unexpected calcification. Skull: Normal. Negative for fracture or focal lesion. Sinuses/Orbits: The paranasal sinuses and mastoid air cells are clear Other: None. IMPRESSION: 1. No acute intracranial abnormalities. 2. Remote right frontal convexity infarct. Electronically Signed   By: Kerby Moors M.D.   On: 10/09/2022 08:41      Assessment/Plan Principal Problem:   Aspiration pneumonia (HCC) Active Problems:   Severe sepsis (HCC)   Hematemesis   Esophagitis   Benign hypertension   Chronic diastolic CHF (congestive heart failure) (HCC)   Arteriosclerosis of coronary artery   Acute metabolic encephalopathy   HLD (hyperlipidemia)   CREST (calcinosis, Raynaud's phenomenon, esophageal dysfunction, sclerodactyly, telangiectasia) (HCC)   History of CVA (cerebrovascular accident)   Dementia ([place])   Depression with anxiety   Protein-calorie malnutrition, severe (HCC)   Assessment  and Plan:  Severe sepsis due to aspiration pneumonia: CTA showed right upper lobe infiltration. Pt may have aspiration pneumonia versus HCAP.  Patient has severe sepsis with WBC 25.0, heart rate 121, lactic acid 4.5.  -Admited to progressive unit as inpatient - IV Vancomycin and Zosyn  - Mucinex for cough  - Bronchodilators - Urine legionella and S. pneumococcal antigen - Follow up blood culture x2, sputum culture - will get Procalcitonin and trend lactic acid level per sepsis protocol - IVF: 1.5L of NS bolus in ED, followed by 75 mL per hour of NS   Hematemesis due to esophagitis: Hgb 13.7 - IVF: as above - Start IV pantoprazole 40 mg bid - Zofran IV for nausea - Avoid NSAIDs and SQ heparin - Maintain IV access (2 large bore IVs if possible). - Monitor closely and follow q6h cbc, transfuse as necessary, if Hgb<7.0 - La[facility]: INR, PTT and type screen  Benign hypertension: Blood pressure 125/72 -Hold blood pressure medications since patient at high risk of developing hypotension due to severe sepsis -IV hydralazine as needed  Chronic diastolic CHF (congestive heart failure) ([place]): 2D echo on 11/17/2017 showed EF 60-65% with grade 1 diastolic dysfunction.  Patient does not have leg edema JVD but CHF seem to be compensated. -Check BNP  Arteriosclerosis of coronary artery -hold ASA -continue Zetia  Acute metabolic encephalopathy: CT head negative.  Possibly due to ongoing infection -Frequent neurocheck -fall precaution  HLD (hyperlipidemia) -Zetia  CREST (calcinosis, Raynaud's phenomenon, esophageal dysfunction, sclerodactyly, telangiectasia):  -hold Plaquenil due to severe sepsis   History of CVA (cerebrovascular accident) -Hold aspirin -Zetia  Dementia ([place]) -Continue home olanzapine -As needed Haldol for agitation  Depression with anxiety -Continue home medication  Protein-calorie malnutrition, severe ([place]): Body weight 34.5 kg, BMI 13.46 -Nutrition  consult      DVT ppx: SCD  Code Status: DNR per her husband. OK to use vasopressor if needed  Family Communication:  Yes, patient's husband at bed side.       by phone  Disposition Plan:  Anticipate discharge back to previous environment  Consults called: Dr. Vicente Males of GI  Admission status and Level of care: Progressive:     as inpt       Dispo: The patient is from: SNF              Anticipated d/c is to: SNF              Anticipated d/c date is: 2 days              Patient currently is not medically stable to d/c.    Severity of Illness:  The appropriate patient status for this patient is INPATIENT. Inpatient status is judged to be reasonable and necessary in order to provide the required intensity of service to ensure the patient's safety. The patient's presenting symptoms, physical exam findings, and initial radiographic and laboratory data in the context of their chronic comorbidities is felt to place them at high risk for further clinical deterioration. Furthermore, it is not anticipated that the patient will be medically stable for discharge from the hospital within 2 midnights of admission.   * I certify that at the point of admission it is my clinical judgment that the patient will require inpatient hospital care spanning beyond 2 midnights from the point of admission due to high intensity of service, high risk for further deterioration and high frequency of surveillance required.*       Date of Service 10/09/2022    Ivor Costa [facility]   If 7PM-7AM, please contact night-coverage [email or web address] 10/09/2022, 10:06 AM

## 2022-10-09 NOTE — Progress Notes (Signed)
Pharmacy Antibiotic Note  Kathleen Walls is a 82 y.o. female presented 10/09/2022 with hematemesis and severe sepsis concerning for HCAP vs aspiration pneumonia. PMH includes dementia, hypertension, hyperlipidemia, CAD with prior PCI, prior CVA, GERD, crest syndrome, recent left hip fracture. Received IV antibiotics in previous 90 days. Pharmacy has been consulted for vancomycin and Zosyn (pip/tazo) dosing.  CTA:  "Dilated, fluid-filled mid and upper esophagus suggests dysmotility and/or gastroesophageal reflux and likely predisposes patient to aspiration." Chest x-ray suspicious for pneumonia. WBC elevated and on 2L [place]. Scr 0.9 (approximating baseline Scr 0.6-0.8).  Received vancomycin 1 g '@0904'$  and Zosyn '@0836'$   Plan: Vancomycin IV 750 mg every 48 hours starting 2/28 '@2100'$  Goal AUC 400-600 Estimated AUC 568.4, Cmin 12.0  Zosyn (pip/tazo) IV 3.375 grams every 8 hours (4 hour infusion) '@1600'$   Follow up MRSA PCR, renal function, length of therapy  Height: '5\' 3"'$  (160 cm) Weight: 34.5 kg (76 lb) IBW/kg (Calculated) : 52.4  Temp (24hrs), Avg:97.9 F (36.6 C), Min:97.9 F (36.6 C), Max:97.9 F (36.6 C)  Recent Labs  Lab 10/09/22 0723  WBC 25.0*  CREATININE 0.90  LATICACIDVEN 4.5*    Estimated Creatinine Clearance: 26.7 mL/min (by C-G formula based on SCr of 0.9 mg/dL).    Allergies  Allergen Reactions   Alendronate Sodium Other (See Comments)    Unknown   Atorvastatin Other (See Comments)    Muscle Pain   Azithromycin Hives   Ciprofloxacin Other (See Comments)    Headache   Colestipol Other (See Comments)    Unknown   Lidocaine Hives   Lovastatin Other (See Comments)    Headache   Metronidazole Other (See Comments)    Headache   Niacin Other (See Comments)    Unknown   Risedronate Sodium Other (See Comments)    Unknown   Rosuvastatin Other (See Comments)    Unknown   Simvastatin Other (See Comments)    Headache    Antimicrobials this admission: vancomycin 2/26  >>  Pip/tazo 2/26 >>   Dose adjustments this admission: N/a  Microbiology results: 2/26 BCx: in process 2/26 Sputum: ordered  2/26 MRSA PCR: ordered  Thank you for allowing pharmacy to be a part of this patient's care.  Glean Salvo, PharmD, BCPS Clinical Pharmacist  10/09/2022 10:10 AM

## 2022-10-09 NOTE — ED Notes (Signed)
Lab notified to get phlebotomy to draw updated labs. RN's unable to obtain.

## 2022-10-09 NOTE — Consult Note (Signed)
PHARMACY -  BRIEF ANTIBIOTIC NOTE   Pharmacy has received consult(s) for vancomycin from an ED provider.  The patient's profile has been reviewed for ht/wt/allergies/indication/available labs.    One time order(s) placed for vancomycin 1000 mg IV  Further antibiotics/pharmacy consults should be ordered by admitting physician if indicated.                       Thank you, Will M. Ouida Sills, PharmD PGY-1 Pharmacy Resident 10/09/2022 8:27 AM

## 2022-10-10 ENCOUNTER — Encounter: Payer: Self-pay | Admitting: Internal Medicine

## 2022-10-10 DIAGNOSIS — A419 Sepsis, unspecified organism: Secondary | ICD-10-CM | POA: Diagnosis not present

## 2022-10-10 DIAGNOSIS — J690 Pneumonitis due to inhalation of food and vomit: Secondary | ICD-10-CM | POA: Diagnosis not present

## 2022-10-10 DIAGNOSIS — K209 Esophagitis, unspecified without bleeding: Secondary | ICD-10-CM

## 2022-10-10 DIAGNOSIS — F01518 Vascular dementia, unspecified severity, with other behavioral disturbance: Secondary | ICD-10-CM | POA: Diagnosis not present

## 2022-10-10 LAB — CBC
HCT: 20.6 % — ABNORMAL LOW (ref 36.0–46.0)
HCT: 36.2 % (ref 36.0–46.0)
HCT: 37.8 % (ref 36.0–46.0)
Hemoglobin: 11.5 g/dL — ABNORMAL LOW (ref 12.0–15.0)
Hemoglobin: 12 g/dL (ref 12.0–15.0)
Hemoglobin: 6.4 g/dL — ABNORMAL LOW (ref 12.0–15.0)
MCH: 26.6 pg (ref 26.0–34.0)
MCH: 26.6 pg (ref 26.0–34.0)
MCH: 26.6 pg (ref 26.0–34.0)
MCHC: 31.1 g/dL (ref 30.0–36.0)
MCHC: 31.7 g/dL (ref 30.0–36.0)
MCHC: 31.8 g/dL (ref 30.0–36.0)
MCV: 83.6 fL (ref 80.0–100.0)
MCV: 83.8 fL (ref 80.0–100.0)
MCV: 85.5 fL (ref 80.0–100.0)
Platelets: 184 10*3/uL (ref 150–400)
Platelets: 251 10*3/uL (ref 150–400)
Platelets: 276 10*3/uL (ref 150–400)
RBC: 2.41 MIL/uL — ABNORMAL LOW (ref 3.87–5.11)
RBC: 4.33 MIL/uL (ref 3.87–5.11)
RBC: 4.51 MIL/uL (ref 3.87–5.11)
RDW: 15.6 % — ABNORMAL HIGH (ref 11.5–15.5)
RDW: 16 % — ABNORMAL HIGH (ref 11.5–15.5)
RDW: 16 % — ABNORMAL HIGH (ref 11.5–15.5)
WBC: 13.2 10*3/uL — ABNORMAL HIGH (ref 4.0–10.5)
WBC: 13.7 10*3/uL — ABNORMAL HIGH (ref 4.0–10.5)
WBC: 9.6 10*3/uL (ref 4.0–10.5)
nRBC: 0 % (ref 0.0–0.2)
nRBC: 0 % (ref 0.0–0.2)
nRBC: 0 % (ref 0.0–0.2)

## 2022-10-10 LAB — BASIC METABOLIC PANEL
Anion gap: 10 (ref 5–15)
BUN: 17 mg/dL (ref 8–23)
CO2: 28 mmol/L (ref 22–32)
Calcium: 8.5 mg/dL — ABNORMAL LOW (ref 8.9–10.3)
Chloride: 103 mmol/L (ref 98–111)
Creatinine, Ser: 0.51 mg/dL (ref 0.44–1.00)
GFR, Estimated: >60 mL/min (ref >60)
Glucose, Bld: 93 mg/dL (ref 70–99)
Potassium: 3 mmol/L — ABNORMAL LOW (ref 3.5–5.1)
Sodium: 141 mmol/L (ref 135–145)

## 2022-10-10 LAB — CBG MONITORING, ED: Glucose-Capillary: 92 mg/dL (ref 70–99)

## 2022-10-10 LAB — LEGIONELLA PNEUMOPHILA SEROGP 1 UR AG: L. pneumophila Serogp 1 Ur Ag: NEGATIVE

## 2022-10-10 LAB — PREPARE RBC (CROSSMATCH)

## 2022-10-10 MED ORDER — POTASSIUM CHLORIDE 10 MEQ/100ML IV SOLN
10.0000 meq | INTRAVENOUS | Status: AC
  Administered 2022-10-10 (×4): 10 meq via INTRAVENOUS
  Filled 2022-10-10 (×2): qty 100

## 2022-10-10 MED ORDER — SODIUM CHLORIDE 0.9% IV SOLUTION
Freq: Once | INTRAVENOUS | Status: AC
  Filled 2022-10-10: qty 250

## 2022-10-10 NOTE — ED Notes (Signed)
Notified Neomia Glass, NP of 6.4 hemoglobin and 20.6 HCT

## 2022-10-10 NOTE — ED Notes (Signed)
Pt cleaned, brief changed, pt repositioned. No further needs expressed at this time, WCTM.

## 2022-10-10 NOTE — Progress Notes (Signed)
Pharmacy Antibiotic Note  Kathleen Walls is a 82 y.o. female presented 10/09/2022 with hematemesis and severe sepsis concerning for HCAP vs aspiration pneumonia. PMH includes dementia, hypertension, hyperlipidemia, CAD with prior PCI, prior CVA, GERD, crest syndrome, recent left hip fracture. Received IV antibiotics in previous 90 days. Pharmacy has been consulted for vancomycin and Zosyn (pip/tazo) dosing.  CTA:  "Dilated, fluid-filled mid and upper esophagus suggests dysmotility and/or gastroesophageal reflux and likely predisposes patient to aspiration." Chest x-ray suspicious for pneumonia. WBC improving. Respiratory status on 2L [place]. Scr 0.9 > 0.51 (at baseline).  Received vancomycin 1 g '@0904'$  and Zosyn '@0836'$   Plan: Vancomycin IV 750 mg every 48 hours starting 2/28 '@2100'$  Goal AUC 400-600 Estimated AUC 514.7, Cmin 10.1 Used TBW, Vd 0.72, Scr 0.8  Continue Zosyn (pip/tazo) IV 3.375 grams every 8 hours (4 hour infusion) '@1600'$   Follow up MRSA PCR, renal function, length of therapy  Height: '5\' 3"'$  (160 cm) Weight: 34.5 kg (76 lb) IBW/kg (Calculated) : 52.4  Temp (24hrs), Avg:98.2 F (36.8 C), Min:96.9 F (36.1 C), Max:99.1 F (37.3 C)  Recent Labs  Lab 10/09/22 0723 10/09/22 0906 10/09/22 1221 10/09/22 1513 10/10/22 0028 10/10/22 0800  WBC 25.0*  --   --  20.0* 9.6 13.7*  CREATININE 0.90  --   --   --   --  0.51  LATICACIDVEN 4.5* 3.9* 2.4* 1.1  --   --      Estimated Creatinine Clearance: 30 mL/min (by C-G formula based on SCr of 0.51 mg/dL).    Allergies  Allergen Reactions   Alendronate Sodium Other (See Comments)    Unknown   Atorvastatin Other (See Comments)    Muscle Pain   Azithromycin Hives   Ciprofloxacin Other (See Comments)    Headache   Colestipol Other (See Comments)    Unknown   Lidocaine Hives   Lovastatin Other (See Comments)    Headache   Metronidazole Other (See Comments)    Headache   Niacin Other (See Comments)    Unknown   Risedronate  Sodium Other (See Comments)    Unknown   Rosuvastatin Other (See Comments)    Unknown   Simvastatin Other (See Comments)    Headache    Antimicrobials this admission: vancomycin 2/26 >>  Pip/tazo 2/26 >>   Dose adjustments this admission: N/a  Microbiology results: 2/26 BCx: NG < 24 hours 2/26 Sputum: ordered  2/26 MRSA PCR: detected  Thank you for allowing pharmacy to be a part of this patient's care.  Glean Salvo, PharmD, BCPS Clinical Pharmacist  10/10/2022 9:34 AM

## 2022-10-10 NOTE — Progress Notes (Signed)
[facility] [place] NAME: Kathleen Walls    MR#:  [id number]  DATE OF BIRTH:  [birth year]  SUBJECTIVE:  history obtained from patient's husband was at bedside in the ER. Patient has advanced dementia unable to give any history review of systems. Patient came in from the [place] with coffee ground emesis. None here today. Seen by G.I. Patient is NPO. Received blood transfusion. Getting IV protonic strip. Overall looks chronically ill and malnourished.    VITALS:  Blood pressure 134/61, pulse (!) 103, temperature 98 F (36.7 C), resp. rate 20, height '5\' 3"'$  (1.6 m), weight 34.5 kg, SpO2 96 %.  PHYSICAL EXAMINATION:   GENERAL:  82 y.o.-year-old patient with no acute distress. Thin,frail, chornically ill LUNGS: Normal breath sounds bilaterally, no wheezing CARDIOVASCULAR: S1, S2 normal. No murmur   ABDOMEN: Soft, nontender, nondistended. Bowel sounds present.  EXTREMITIES: No  edema b/l.    NEUROLOGIC: nonfocal  patient is alert but at at baseline severe dementia SKIN: dry skin with decreased skin crease   LABORATORY PANEL:  CBC Recent Labs  Lab 10/10/22 0800  WBC 13.7*  HGB 11.5*  HCT 36.2  PLT 251    Chemistries  Recent Labs  Lab 10/09/22 0723 10/10/22 0800  NA 138 141  K 3.7 3.0*  CL 95* 103  CO2 28 28  GLUCOSE 219* 93  BUN 37* 17  CREATININE 0.90 0.51  CALCIUM 9.5 8.5*  AST 35  --   ALT 24  --   ALKPHOS 210*  --   BILITOT 0.5  --     RADIOLOGY:  DG Chest Port 1 View  Result Date: 10/09/2022 CLINICAL DATA:  Possible sepsis. EXAM: PORTABLE CHEST 1 VIEW COMPARISON:  09/10/2022 FINDINGS: Lungs are hyperexpanded. New airspace disease noted parahilar right lung, suspicious for pneumonia, better evaluated on recent chest CTA. Focal opacity at the right cardio phrenic angle is compatible with the probable pericardial cyst seen on recent CT imaging. The cardiopericardial silhouette is within normal limits for size. The visualized  bony structures of the thorax are unremarkable. Telemetry leads overlie the chest. IMPRESSION: New airspace disease in the parahilar right lung, suspicious for pneumonia, better evaluated on recent chest CTA. Electronically Signed   By: Misty Stanley M.D.   On: 10/09/2022 08:59   CT Angio Chest PE W and/or Wo Contrast  Result Date: 10/09/2022 CLINICAL DATA:  Hematemesis for 3 days. EXAM: CT ANGIOGRAPHY CHEST WITH CONTRAST TECHNIQUE: Multidetector CT imaging of the chest was performed using the standard protocol during bolus administration of intravenous contrast. Multiplanar CT image reconstructions and MIPs were obtained to evaluate the vascular anatomy. RADIATION DOSE REDUCTION: This exam was performed according to the departmental dose-optimization program which includes automated exposure control, adjustment of the mA and/or kV according to patient size and/or use of iterative reconstruction technique. CONTRAST:  51m OMNIPAQUE IOHEXOL 350 MG/ML SOLN COMPARISON:  09/10/2022 chest radiograph.  Chest CT of 07/21/2015. FINDINGS: Cardiovascular: The quality of this exam for evaluation of pulmonary embolism is moderate. Limitations include patient arm position, not raised above the head and mildly suboptimal bolus timing, centered in the aorta. No central lobar, or large segmental pulmonary embolism identified. Aortic atherosclerosis. Tortuous thoracic aorta. Mild cardiomegaly, without pericardial effusion. Three vessel coronary artery calcification. Mediastinum/Nodes: No mediastinal or hilar adenopathy. The distal esophagus is mildly dilated and thick walled, including on 107/4. Fluid-filled more superiorly, including on 66/4. Cystic lesion within the right cardiophrenic angle measures 4.3 x  2.8 cm on 111/4 and is relatively similar to on the prior. Lungs/Pleura: No pleural fluid. Mild biapical pleuroparenchymal scarring. Posterior right upper lobe nodular airspace disease including on 48 and 52 of series 6.  Clustered right lower lobe tree-in-bud nodules are similar, including on 81/6. A pleural-based right lower lobe 5 mm nodule on 72/6 is unchanged and considered benign. 4-5 mm left lower lobe subpleural pulmonary nodule on 84/6 is also unchanged and considered benign. Upper Abdomen: Hepatic steatosis. Normal imaged portions of the spleen, pancreas, gallbladder, right adrenal gland. Left adrenal thickening. Bilateral renal sinus cysts. Interpolar left renal too small to characterize lesion is most likely a cyst at 4 mm . In the absence of clinically indicated signs/symptoms require(s) no independent follow-up. Musculoskeletal: Nonspecific left breast calcifications of up to 9 mm. T11 moderate compression deformity is new since the prior with minimal ventral canal encroachment. Review of the MIP images confirms the above findings. IMPRESSION: 1. Multifactorial degradation, as detailed above. No pulmonary embolism to the large segmental level. 2. Posterior right upper lobe nodular consolidation could represent aspiration or infection. 3. Lower esophageal wall thickening suggests esophagitis. Dilated, fluid-filled mid and upper esophagus suggests dysmotility and/or gastroesophageal reflux and likely predisposes patient to aspiration. 4. Hepatic steatosis 5. Similar right sided pericardial cyst. 6. Interval T11 compression deformity, favored to be nonacute. This could be correlated with point tenderness. 7. Coronary artery atherosclerosis. Aortic Atherosclerosis (ICD10-I70.0). Electronically Signed   By: Abigail Miyamoto M.D.   On: 10/09/2022 08:51   CT ABDOMEN PELVIS W CONTRAST  Result Date: 10/09/2022 CLINICAL DATA:  Hematemesis. EXAM: CT ABDOMEN AND PELVIS WITH CONTRAST TECHNIQUE: Multidetector CT imaging of the abdomen and pelvis was performed using the standard protocol following bolus administration of intravenous contrast. RADIATION DOSE REDUCTION: This exam was performed according to the departmental  dose-optimization program which includes automated exposure control, adjustment of the mA and/or kV according to patient size and/or use of iterative reconstruction technique. CONTRAST:  102m OMNIPAQUE IOHEXOL 350 MG/ML SOLN COMPARISON:  August 21, 2016. FINDINGS: Lower chest: Visualized lung bases are unremarkable. Wall thickening of distal esophagus is noted. Hepatobiliary: Gallbladder distention is noted without cholelithiasis. No biliary dilatation is noted. The liver is unremarkable. Pancreas: Unremarkable. No pancreatic ductal dilatation or surrounding inflammatory changes. Spleen: Normal in size without focal abnormality. Adrenals/Urinary Tract: Adrenal glands are unremarkable. Kidneys are normal, without renal calculi, focal lesion, or hydronephrosis. Bladder is unremarkable. Stomach/Bowel: Stomach is unremarkable. There is no evidence of bowel obstruction or inflammation. Large amount of stool seen in the rectum concerning for impaction. Vascular/Lymphatic: Aortic atherosclerosis. No enlarged abdominal or pelvic lymph nodes. Reproductive: Status post hysterectomy. No adnexal masses. Other: No abdominal wall hernia or abnormality. No abdominopelvic ascites. Musculoskeletal: Status post surgical internal fixation of proximal left femoral fracture. No acute osseous abnormality is noted. Old T11 fracture is noted. IMPRESSION: Wall thickening of visualized distal esophagus is noted suggesting esophagitis. Endoscopy is recommended for further evaluation. Gallbladder distention without evidence of cholecystitis or inflammation. Large amount of stool seen in the rectum concerning for impaction. Aortic Atherosclerosis (ICD10-I70.0). Electronically Signed   By: JMarijo ConceptionM.D.   On: 10/09/2022 08:42   CT Head Wo Contrast  Result Date: 10/09/2022 CLINICAL DATA:  Mental status change.  Hematemesis for 3 days. EXAM: CT HEAD WITHOUT CONTRAST TECHNIQUE: Contiguous axial images were obtained from the base of the  skull through the vertex without intravenous contrast. RADIATION DOSE REDUCTION: This exam was performed according to the departmental dose-optimization  program which includes automated exposure control, adjustment of the mA and/or kV according to patient size and/or use of iterative reconstruction technique. COMPARISON:  09/10/2022 FINDINGS: Brain: No evidence of acute infarction, hemorrhage, hydrocephalus, extra-axial collection or mass lesion/mass effect. Remote right frontal convexity infarct is unchanged compared with the previous exam, image 22/3. Vascular: No hyperdense vessel or unexpected calcification. Skull: Normal. Negative for fracture or focal lesion. Sinuses/Orbits: The paranasal sinuses and mastoid air cells are clear Other: None. IMPRESSION: 1. No acute intracranial abnormalities. 2. Remote right frontal convexity infarct. Electronically Signed   By: Kerby Moors M.D.   On: 10/09/2022 08:41    Assessment and Plan  WILHELMINIA LOE is a 82 y.o. female with medical history significant of dementia, hypertension, hyperlipidemia, prediabetes, CAD with stent placement, stroke/TIA, GERD, dCHF, severe protein calorie malnutrition, depression with anxiety, kidney stone, breast cancer, diverticulitis, CREST syndrome, who presents with hematemesis and AMS.   Patient was recently hospitalized from 1/28 - 2/2 due to left hip fracture.  Patient finished rehab after surgery and went back to [facility] (long term) on Friday. Per husband at bedside, pt was found to have hematemesis with coffee-ground material since yday  CTA: 1. Multifactorial degradation, as detailed above. No pulmonary embolism to the large segmental level. 2. Posterior right upper lobe nodular consolidation could represent aspiration or infection. 3. Lower esophageal wall thickening suggests esophagitis. Dilated, fluid-filled mid and upper esophagus suggests dysmotility and/or gastroesophageal reflux and likely predisposes patient  to aspiration. 4. Hepatic steatosis 5. Similar right sided pericardial cyst. 6. Interval T11 compression deformity, favored to be nonacute. This could be correlated with point tenderness. 7. Coronary artery atherosclerosis. Aortic Atherosclerosis (ICD10-I70.0).    CT-abd/pelvis: Wall thickening of visualized distal esophagus is noted suggesting esophagitis. Endoscopy is recommended for further evaluation.  Gallbladder distention without evidence of cholecystitis or inflammation. Large amount of stool seen in the rectum concerning for impaction.  Severe sepsis due to aspiration pneumonia: CTA showed right upper lobe infiltration. Pt may have aspiration pneumonia versus HCAP.  Patient has severe sepsis with WBC 25.0, heart rate 121, lactic acid 4.5-->1.1 - IV Vancomycin and Zosyn --d/c vanc - Mucinex for cough  - Bronchodilators prn - Follow up blood culture x2 negative  Hematemesis due to esophagitis: Hgb 13.7 - IVF: as above - Start IV pantoprazole 40 mg bid - Zofran IV for nausea - Avoid NSAIDs and SQ heparin -  13.7-- 10.3--6.4--1unit BT--11.5 --GI consult with Dr. Vicente Males. Given aspiration pneumonia hold off on invasive procedure unless patient starts repeating.   Benign hypertension: Blood pressure 125/72 -Hold blood pressure medications since patient at high risk of developing hypotension due to severe sepsis -IV hydralazine as needed   Chronic diastolic CHF (congestive heart failure) ([place]): 2D echo on 11/17/2017 showed EF 60-65% with grade 1 diastolic dysfunction.  Patient does not have leg edema JVD but CHF seem to be compensated. -Ch BNP--82   Arteriosclerosis of coronary artery -hold ASA -continue Zetia   Acute metabolic encephalopathy: CT head negative.  Pt has baseline severe Dementia   HLD (hyperlipidemia) -Zetia   CREST (calcinosis, Raynaud's phenomenon, esophageal dysfunction, sclerodactyly, telangiectasia):  -hold Plaquenil due to severe sepsis   History  of CVA (cerebrovascular accident) -Hold aspirin -Zetia   Dementia ([place]) -Continue home olanzapine -As needed Haldol for agitation   Depression with anxiety -Continue home medication   Protein-calorie malnutrition, severe ([place]): Body weight 34.5 kg, BMI 13.46 -Nutrition consult    pt has been seen by [employer]  care in January 2024--she will benefit from f/u as out pt given overall poor long term prognosis   DVT ppx: SCD   Code Status: DNR per her husband.   Family Communication:  Yes, patient's husband at bed side.          Disposition Plan:  Anticipate discharge back to previous environment   Consults called: Dr. Vicente Males of GI  Level of care: Progressive Status is: Inpatient Remains inpatient appropriate because: sepsis, GI bleed    TOTAL TIME TAKING CARE OF THIS PATIENT: 35 minutes.  >50% time spent on counselling and coordination of care  Note: This dictation was prepared with Dragon dictation along with smaller phrase technology. Any transcriptional errors that result from this process are unintentional.  [name] M.D    [facility]   CC: Primary care physician; Bonnita Nasuti, MD

## 2022-10-10 NOTE — Progress Notes (Signed)
       CROSS COVER NOTE  NAME: Kathleen Walls MRN: [id number] DOB : 10-23-1940 ATTENDING PHYSICIAN: Ivor Costa, MD    Date of Service   10/10/2022   HPI/Events of Note   Message received from RN reporting HGB 6.4. Called husband and consent for blood transfusion obtained.   Interventions   Assessment/Plan:  Transfuse 1U PRBC      To reach the provider On-Call:   7AM- 7PM see care teams to locate the attending and reach out to them via [email or web address] Password: [email or web address] 7PM-7AM contact night-coverage If you still have difficulty reaching the appropriate provider, please page the [facility] (Director on Call) for [facility] on amion for assistance  This document was prepared using Systems analyst and may include unintentional dictation errors.  Neomia Glass DNP, MBA, FNP-BC, PMHNP-BC Nurse Practitioner [facility] Pager [phone]

## 2022-10-11 DIAGNOSIS — J690 Pneumonitis due to inhalation of food and vomit: Secondary | ICD-10-CM | POA: Diagnosis not present

## 2022-10-11 LAB — CBC
HCT: 35.6 % — ABNORMAL LOW (ref 36.0–46.0)
HCT: 39 % (ref 36.0–46.0)
Hemoglobin: 11.3 g/dL — ABNORMAL LOW (ref 12.0–15.0)
Hemoglobin: 12.5 g/dL (ref 12.0–15.0)
MCH: 26.5 pg (ref 26.0–34.0)
MCH: 26.3 pg (ref 26.0–34.0)
MCHC: 31.7 g/dL (ref 30.0–36.0)
MCHC: 32.1 g/dL (ref 30.0–36.0)
MCV: 83.4 fL (ref 80.0–100.0)
MCV: 81.9 fL (ref 80.0–100.0)
Platelets: 265 10*3/uL (ref 150–400)
Platelets: 292 10*3/uL (ref 150–400)
RBC: 4.27 MIL/uL (ref 3.87–5.11)
RBC: 4.76 MIL/uL (ref 3.87–5.11)
RDW: 16.1 % — ABNORMAL HIGH (ref 11.5–15.5)
RDW: 15.8 % — ABNORMAL HIGH (ref 11.5–15.5)
WBC: 10.6 10*3/uL — ABNORMAL HIGH (ref 4.0–10.5)
WBC: 11.3 10*3/uL — ABNORMAL HIGH (ref 4.0–10.5)
nRBC: 0 % (ref 0.0–0.2)
nRBC: 0 % (ref 0.0–0.2)

## 2022-10-11 LAB — TYPE AND SCREEN
ABO/RH(D): A POS
Antibody Screen: NEGATIVE
Unit division: 0

## 2022-10-11 LAB — BASIC METABOLIC PANEL
Anion gap: 14 (ref 5–15)
BUN: 14 mg/dL (ref 8–23)
CO2: 23 mmol/L (ref 22–32)
Calcium: 8.6 mg/dL — ABNORMAL LOW (ref 8.9–10.3)
Chloride: 103 mmol/L (ref 98–111)
Creatinine, Ser: 0.58 mg/dL (ref 0.44–1.00)
GFR, Estimated: >60 mL/min (ref >60)
Glucose, Bld: 56 mg/dL — ABNORMAL LOW (ref 70–99)
Potassium: 3.1 mmol/L — ABNORMAL LOW (ref 3.5–5.1)
Sodium: 140 mmol/L (ref 135–145)

## 2022-10-11 LAB — GLUCOSE, CAPILLARY
Glucose-Capillary: 51 mg/dL — ABNORMAL LOW (ref 70–99)
Glucose-Capillary: 180 mg/dL — ABNORMAL HIGH (ref 70–99)
Glucose-Capillary: 181 mg/dL — ABNORMAL HIGH (ref 70–99)
Glucose-Capillary: 124 mg/dL — ABNORMAL HIGH (ref 70–99)

## 2022-10-11 LAB — BPAM RBC
Blood Product Expiration Date: 202403012359
ISSUE DATE / TIME: 202402270206
Unit Type and Rh: 600

## 2022-10-11 LAB — MAGNESIUM: Magnesium: 1.8 mg/dL (ref 1.7–2.4)

## 2022-10-11 MED ORDER — DEXTROSE 50 % IV SOLN
25.0000 g | INTRAVENOUS | Status: AC
  Administered 2022-10-11: 25 g via INTRAVENOUS
  Filled 2022-10-11: qty 50

## 2022-10-11 MED ORDER — POTASSIUM CHLORIDE 10 MEQ/100ML IV SOLN
10.0000 meq | INTRAVENOUS | Status: AC
  Administered 2022-10-11 (×4): 10 meq via INTRAVENOUS
  Filled 2022-10-11 (×4): qty 100

## 2022-10-11 MED ORDER — DEXTROSE-NACL 5-0.9 % IV SOLN: INTRAVENOUS | Status: DC

## 2022-10-11 NOTE — Hospital Course (Addendum)
81F, hx of dementia, HTN, HLD, prediabetes, CAD with stent placement, stroke/TIA, GERD, depression with anxiety, kidney stone, breast cancer, diverticulitis, CREST syndrome, who presents with hematemesis and found to have Hgb 13.7 (9.3 on 09/14/22), severe sepsis due to possible aspiration pneumonia.  Underwent CT and CT abdomen pelvis.  CT scan of abdomen/pelvis that showed esophagitis.  CTA negative for PE. Started protonix,zosyn.  Patient was admitted for severe sepsis with aspiration pneumonia, hematemesis.  Seen by gastroenterology who advised unless patient has a hemorrhage or significant bleed would not perform EGD at this time due to pneumonia advised to wait for a week at least if not longer and advise goals of care suggestion discussion Patient seen by palliative care in January 2024 will benefit from follow-up as outpatient given overall poor long-term prognosis.

## 2022-10-11 NOTE — Evaluation (Signed)
Clinical/Bedside Swallow Evaluation Patient Details  Name: Kathleen Walls MRN: [id number] Date of Birth: 05-13-1941  Today's Date: 10/11/2022 Time: SLP Start Time (ACUTE ONLY): 73 SLP Stop Time (ACUTE ONLY): 1330 SLP Time Calculation (min) (ACUTE ONLY): 60 min  Past Medical History:  Past Medical History:  Diagnosis Date   Arthritis    Breast cancer ([place]) 2009    2009 breast cancer stage IIA T2 N0, ER positive, HER-2 negative, 2.5 cm primary tumor, poorly differentiated, status post wide local excision, one sentinel lymph node negative, high-risk Oncotype score 48   Cataracts, both eyes    Colitis    Complication of anesthesia    confusion   Coronary artery disease    Detached vitreous humor    Diverticulitis    Female bladder prolapse    Gastritis    GERD (gastroesophageal reflux disease)    Heart valve problem 06/19/2013   Leaking   Hemorrhoid    Hemorrhoids    History of kidney stones    Hyperlipemia    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[place])    ERPR POSITIVE 2.5CM GRADE 3, STAGE 2   Kidney cysts    Left   Kidney stones    Lipoma of shoulder 2009   left   Migraine    Osteoarthritis    Osteopenia    Stented coronary artery    Stroke [facility])    Past Surgical History:  Past Surgical History:  Procedure Laterality Date   ABDOMINAL HYSTERECTOMY     benign breast biopsy  2007   BREAST BIOPSY Left 2007   positive   BREAST CYST ASPIRATION Right    neg   BREAST LUMPECTOMY Left 2009    2009 breast cancer stage IIA T2 N0, ER positive, HER-2 negative, 2.5 cm primary tumor, poorly differentiated, status post wide local excision, one sentinel lymph node negative, high-risk Oncotype score 48   BREAST SURGERY     CARDIAC CATHETERIZATION N/A 06/22/2015   Procedure: Left Heart Cath and Coronary Angiography;  Surgeon: Teodoro Spray, MD;  Location: [place] [facility];  Service: Cardiovascular;  Laterality: N/A;   CARDIAC CATHETERIZATION     CATARACT EXTRACTION      COLONOSCOPY     CORONARY ANGIOPLASTY     ENDARTERECTOMY Right 12/26/2017   Procedure: ENDARTERECTOMY CAROTID;  Surgeon: Katha Cabal, MD;  Location: [facility];  Service: Vascular;  Laterality: Right;   EYE SURGERY     FOOT FRACTURE SURGERY Left    INTRAMEDULLARY (IM) NAIL INTERTROCHANTERIC Left 09/11/2022   Procedure: INTRAMEDULLARY (IM) NAIL INTERTROCHANTERIC;  Surgeon: Earnestine Leys, MD;  Location: [facility] OR[facility];  Service: Orthopedics;  Laterality: Left;   PARTIAL HYSTERECTOMY     UPPER GASTROINTESTINAL ENDOSCOPY     HPI:  Pt s a 82 y.o. female with medical history significant of Advanced Dementia, hypertension, hyperlipidemia, prediabetes, CAD with stent placement, stroke/TIA, GERD, dCHF, severe protein calorie malnutrition, depression with anxiety, kidney stone, breast cancer, diverticulitis, CREST syndrome, who presents with hematemesis and AMS.  Patient was recently hospitalized from 1/28 - 2/2 due to left hip fracture.  Patient finished rehab after surgery and went back to SNF on Friday. Per husband at bedside, pt was found to have hematemesis with coffee-ground material on morning of admit; no difficulty breathing nor facila droop.  Pt endorses stomach "pain" to Husband intermittently.     CT Angio of Chest includes: "posterior right upper lobe nodular consolidation could represent  aspiration or infection.  Lower Esophageal wall thickening  suggests Esophagitis. Dilated,  fluid-filled mid and upper esophagus suggests dysmotility and/or  gastroesophageal reflux and likely predisposes patient to  aspiration".  Previous CXR last admit in 08/2022 was negative.     Assessment / Plan / Recommendation  Clinical Impression  Pt seen today for BSE. Husband present. Pt alert, awake w/ mumbled, tangential speech. Severe Confusion; advanced Dementia at Baseline.  On RA, afebrile.   Pt appears to present w/ oral prep stage dysphagia heavily impacted by Significant Cognitive decline; no overt  oropharyngeal phase swallowing deficits during po intake. Husband stated the Confusion is "worse right now" but noted that her Cognitive decline, Dementia has impacted her overall oral intake at the NH and during previous hospitalization last month(08/2022). She was recently upgraded to her baseline diet of soft solid foods and thin liquids -- she is usually able to hold the cup to feed self, per his report. ANY Cognitive decline can impact her overall awareness/timing of swallow and safety during po tasks which increases risk for aspiration, choking.    OF NOTE: pt has a baseline presentation of GERD/REFLUX and recent episode of hematemesis with coffee-ground material. CT Imaging of Chest revealed: "Lower Esophageal wall thickening suggests Esophagitis. Dilated,  fluid-filled mid and upper esophagus suggests dysmotility and/or  gastroesophageal reflux and likely predisposes patient to  aspiration".  ANY Dysmotility or Regurgitation of Reflux material can increase risk for aspiration of the Reflux material during Retrograde flow thus impact Voicing and Pulmonary status.     Pt consumed several trials of ice chips(masticating each appropriately), purees, and thin liquids via cup w/ No overt clinical s/s of aspiration noted: no decline in vocal quality; no immediate cough, and no decline in respiratory status during/post trials. Oral phase was adequate for bolus management and oral clearing of the boluses given. Mastication noted of ice chips. Pt attempted self-feeding but required MOD++ support and guidance d/t the Cognitive decline. Helping to feed self improves safety of swallowing.  OM Exam was cursory appeared [facility] w/ No unilateral orofacial weakness noted. Confusion of OM tasks and oral care noted. Hand over hand guidance and visual cue were helpful to initiate tasks overall.        In setting of baseline Dementia and Cognitive decline, acute illness/hospitalization, and Esophageal phase Dysmotility,  recommend initiation of the dysphagia level 2(MINCED foods moistened for ease of oral phase/mastication) w/ thin liquids via Cup; general aspiration precautions; reduce Distractions during meals and engage pt during meals for self-feeding. Pills Crushed in Puree for safer swallowing as needed. Support w/ feeding and Supervision at all meals. MD/NSG updated.  ST services recommends follow w/ Palliative Care for [place] and education re: impact of Cognitive decline/Dementia on swallowing. Suspect pt is close to/at her baseline. Precautions posted in room. Education completed w/ Husband in room who agreed w/ POC.  Recommend f/u at her NH for any further diet upgrade post acuity of illness and resolution of Esophageal phase Dysmotility. MD/NSG and TOC updated.  SLP Visit Diagnosis: Dysphagia, oral phase (R13.11) (advanced Dementia)    Aspiration Risk  Mild aspiration risk;Risk for inadequate nutrition/hydration    Diet Recommendation   dysphagia level 2(MINCED foods moistened for ease of oral phase/mastication) w/ thin liquids via Cup; general aspiration precautions; reduce Distractions during meals and engage pt during meals for self-feeding. Support w/ feeding and Supervision at all meals.   Medication Administration: Crushed with puree    Other  Recommendations Recommended Consults: Consider GI evaluation;Consider esophageal assessment (Palliative Care; Dietician) Oral Care  Recommendations: Oral care BID;Oral care before and after PO;Staff/trained caregiver to provide oral care    Recommendations for follow up therapy are one component of a multi-disciplinary discharge planning process, led by the attending physician.  Recommendations may be updated based on patient status, additional functional criteria and insurance authorization.  Follow up Recommendations Skilled nursing-short term rehab (<3 hours/day)      Assistance Recommended at Discharge  Full d/t Dementia  Functional Status Assessment  Patient has had a recent decline in their functional status and/or demonstrates limited ability to make significant improvements in function in a reasonable and predictable amount of time  Frequency and Duration  (n/a)   (n/a)       Prognosis Prognosis for improved oropharyngeal function: Fair Barriers to Reach Goals: Cognitive deficits;Language deficits;Time post onset;Severity of deficits;Behavior Barriers/Prognosis Comment: Esophageal phase deficits: Lower Esophageal wall thickening suggests Esophagitis. Dilated, fluid-filled mid and upper esophagus suggests dysmotility and/or gastroesophageal reflux, per Imaging      Swallow Study   General Date of Onset: 10/09/22 HPI: Pt s a 82 y.o. female with medical history significant of Advanced Dementia, hypertension, hyperlipidemia, prediabetes, CAD with stent placement, stroke/TIA, GERD, dCHF, severe protein calorie malnutrition, depression with anxiety, kidney stone, breast cancer, diverticulitis, CREST syndrome, who presents with hematemesis and AMS.  Patient was recently hospitalized from 1/28 - 2/2 due to left hip fracture.  Patient finished rehab after surgery and went back to SNF on Friday. Per husband at bedside, pt was found to have hematemesis with coffee-ground material on morning of admit; no difficulty breathing nor facila droop.  Pt endorses stomach "pain" to Husband intermittently.   CT Angio of Chest includes: "posterior right upper lobe nodular consolidation could represent  aspiration or infection.  Lower Esophageal wall thickening suggests Esophagitis. Dilated,  fluid-filled mid and upper esophagus suggests dysmotility and/or  gastroesophageal reflux and likely predisposes patient to  aspiration".  Previous CXR last admit in 08/2022 was negative. Type of Study: Bedside Swallow Evaluation Previous Swallow Assessment: no BSE Diet Prior to this Study: NPO Temperature Spikes Noted: No (wbc 10.6 trending down) Respiratory Status: Room  air History of Recent Intubation: No Behavior/Cognition: Alert;Cooperative;Pleasant mood;Confused;Requires cueing;Doesn't follow directions (fidgity) Oral Cavity Assessment: Dry (min sticky appearing) Oral Care Completed by SLP:  (attempted) Oral Cavity - Dentition: Adequate natural dentition Vision:  (adequate appearing) Self-Feeding Abilities: Total assist (d/t Confusion) Patient Positioning: Upright in bed (needed support, positioning) Baseline Vocal Quality: Normal;Low vocal intensity Volitional Cough: Cognitively unable to elicit Volitional Swallow: Unable to elicit    Oral/Motor/Sensory Function Overall Oral Motor/Sensory Function: Within functional limits (no unilateral weakness noted in general oral movements and bolus management)   Ice Chips Ice chips: Within functional limits Presentation: Spoon (fed; 3 trials) Other Comments: decreased awareness of bolus during Prep stage. Masticated ice chips appropriately.   Thin Liquid Thin Liquid: Impaired (oral prep stage; decreased awareness) Presentation: Cup;Self Fed (fully supported; 8 trials accepted) Oral Phase Impairments: Poor awareness of bolus Pharyngeal  Phase Impairments:  (no immediate coughing to po trials) Other Comments: hand over hand guidance; much Cognitive confusion w/ tasks    Nectar Thick Nectar Thick Liquid: Not tested   Honey Thick Honey Thick Liquid: Not tested   Puree Puree: Impaired (oral prep stage; decreased awareness) Presentation: Spoon (fed; 9 trials) Oral Phase Impairments: Poor awareness of bolus Oral Phase Functional Implications:  (adequate) Pharyngeal Phase Impairments:  (none) Other Comments: hand over hand guidance; much Cognitive confusion w/ tasks   Solid  Solid: Not tested         Orinda Kenner, MS, [place] Speech Language Pathologist Rehab Services; [place] [phone] (ascom) Shawan Corella 10/11/2022,3:18 PM

## 2022-10-11 NOTE — Plan of Care (Signed)
  Problem: Education: Goal: Verbalization of understanding the information provided (i.e., activity precautions, restrictions, etc) will improve Outcome: Progressing Goal: Individualized Educational Video(s) Outcome: Progressing   Problem: Activity: Goal: Ability to ambulate and perform ADLs will improve Outcome: Progressing   Problem: Clinical Measurements: Goal: Postoperative complications will be avoided or minimized Outcome: Progressing   Problem: Self-Concept: Goal: Ability to maintain and perform role responsibilities to the fullest extent possible will improve Outcome: Progressing   Problem: Pain Management: Goal: Pain level will decrease Outcome: Progressing   Problem: Activity: Goal: Ability to tolerate increased activity will improve Outcome: Progressing   Problem: Clinical Measurements: Goal: Ability to maintain a body temperature in the normal range will improve Outcome: Progressing   Problem: Respiratory: Goal: Ability to maintain adequate ventilation will improve Outcome: Progressing Goal: Ability to maintain a clear airway will improve Outcome: Progressing   Problem: Education: Goal: Knowledge of General Education information will improve Description: Including pain rating scale, medication(s)/side effects and non-pharmacologic comfort measures Outcome: Progressing   Problem: Health Behavior/Discharge Planning: Goal: Ability to manage health-related needs will improve Outcome: Progressing   Problem: Clinical Measurements: Goal: Ability to maintain clinical measurements within normal limits will improve Outcome: Progressing Goal: Will remain free from infection Outcome: Progressing Goal: Diagnostic test results will improve Outcome: Progressing Goal: Respiratory complications will improve Outcome: Progressing Goal: Cardiovascular complication will be avoided Outcome: Progressing   Problem: Activity: Goal: Risk for activity intolerance will  decrease Outcome: Progressing   Problem: Nutrition: Goal: Adequate nutrition will be maintained Outcome: Progressing   Problem: Coping: Goal: Level of anxiety will decrease Outcome: Progressing   Problem: Elimination: Goal: Will not experience complications related to bowel motility Outcome: Progressing Goal: Will not experience complications related to urinary retention Outcome: Progressing   Problem: Pain Managment: Goal: General experience of comfort will improve Outcome: Progressing   Problem: Safety: Goal: Ability to remain free from injury will improve Outcome: Progressing   Problem: Skin Integrity: Goal: Risk for impaired skin integrity will decrease Outcome: Progressing

## 2022-10-11 NOTE — Progress Notes (Signed)
Initial Nutrition Assessment  DOCUMENTATION CODES:   Severe malnutrition in context of chronic illness, Underweight  INTERVENTION:   -Once diet is advanced, add:   -MVI with minerals daily -Magic cup TID with meals, each supplement provides 290 kcal and 9 grams of protein  -[employer] Shake TID, each supplement provides 220 kcals and 6 grams protein  NUTRITION DIAGNOSIS:   Severe Malnutrition related to chronic illness as evidenced by severe fat depletion, severe muscle depletion, percent weight loss.  GOAL:   Patient will meet greater than or equal to 90% of their needs  MONITOR:   PO intake, Supplement acceptance, Diet advancement  REASON FOR ASSESSMENT:   Consult Assessment of nutrition requirement/status  ASSESSMENT:   Pt with medical history significant of dementia, hypertension, hyperlipidemia, prediabetes, CAD with stent placement, stroke/TIA, GERD, dCHF, severe protein calorie malnutrition, depression with anxiety, kidney stone, breast cancer, diverticulitis, CREST syndrome, who presents with hematemesis and AMS.  Pt admitted with severe sepsis secondary to aspiration pneumonia and meatemesis secondary to esophagitis.   Reviewed I/O's: +876 ml x 24 hours and +3.1 L since admission  UOP: 1.3 L x 24 hours  Spoke with pt and husband at bedside. Pt unable to provide history, which was provided to husband. Pt familiar to this RD from prior admission. Per husband, pt has experienced a general decline in health over the past month since admission for hip surgery. Pt completed stay at SNF for therapy and just returned to ALF this past Friday. Pt has had very poor oral intake since prior admission, consuming bites and sips at meals. Per husband, pt started eating a little better when returning to facility (50% of meals at best). He is awaiting speech therapy consult.   Per husband, pt has experienced wt loss (about 10-15#) over the past month. UBW is around 86#. Pt has  experienced a 19.6% wt loss over the past 6 months, which is significant for time frame.   Pt consumes [employer] and [employer]. Pt husband requesting sending these once diet is advanced.   Medications reviewed and include remeron, senna, ferrous sulfate, and dextrose 5%-0.9% sodium chloride infusion @ 75 ml/hr.   Lab Results  Component Value Date   HGBA1C 5.9 (H) 11/17/2017   PTA DM medications are none.   Labs reviewed: K: 3.1 (on supplementation), CBGS: 51-181 (inpatient orders for glycemic control are ).    NUTRITION - FOCUSED PHYSICAL EXAM:  Flowsheet Row Most Recent Value  Orbital Region Severe depletion  Upper Arm Region Severe depletion  Thoracic and Lumbar Region Severe depletion  Buccal Region Severe depletion  Temple Region Severe depletion  Clavicle Bone Region Severe depletion  Clavicle and Acromion Bone Region Severe depletion  Scapular Bone Region Severe depletion  Dorsal Hand Severe depletion  Patellar Region Severe depletion  Anterior Thigh Region Severe depletion  Posterior Calf Region Severe depletion  Edema (RD Assessment) None  Hair Reviewed  Eyes Reviewed  Mouth Reviewed  Skin Reviewed  Nails Reviewed       Diet Order:   Diet Order             Diet NPO time specified Except for: Ice Chips, Sips with Meds  Diet effective now                   EDUCATION NEEDS:   Education needs have been addressed  Skin:  Skin Assessment: Skin Integrity Issues: Skin Integrity Issues:: Incisions Incisions: closed lt thigh  Last BM:  Unknown  Height:   Ht Readings from Last 1 Encounters:  10/09/22 '5\' 3"'$  (1.6 m)    Weight:   Wt Readings from Last 1 Encounters:  10/11/22 32.8 kg    Ideal Body Weight:  52.3 kg  BMI:  Body mass index is 12.81 kg/m.  Estimated Nutritional Needs:   Kcal:  1300-1500  Protein:  65-80 grams  Fluid:  > 1.3 L    Loistine Chance, RD, LDN, [place] Registered Dietitian II Certified Diabetes Care and  Education Specialist Please refer to [facility] for RD and/or RD on-call/weekend/after hours pager

## 2022-10-11 NOTE — Progress Notes (Addendum)
PROGRESS NOTE Kathleen Walls  [id number] DOB: 1941/05/09 DOA: 10/09/2022 PCP: Bonnita Nasuti, MD   Brief Narrative/Hospital Course: 82F, hx of dementia, HTN, HLD, prediabetes, CAD with stent placement, stroke/TIA, GERD, depression with anxiety, kidney stone, breast cancer, diverticulitis, CREST syndrome, who presents with hematemesis and found to have Hgb 13.7 (9.3 on 09/14/22), severe sepsis due to possible aspiration pneumonia.  Underwent CT and CT abdomen pelvis.  CT scan of abdomen/pelvis that showed esophagitis.  CTA negative for PE. Started protonix,zosyn.  Patient was admitted for severe sepsis with aspiration pneumonia, hematemesis.  Seen by gastroenterology who advised unless patient has a hemorrhage or significant bleed would not perform EGD at this time due to pneumonia advised to wait for a week at least if not longer and advise goals of care suggestion discussion Patient seen by palliative care in January 2024 will benefit from follow-up as outpatient given overall poor long-term prognosis.    Subjective: Seen examined Husband at bedside He states "she had coffee ground emesis on Monday at [facility] so sent to Hospital" Overnight BP fairly stable heart rate 70s to 101, not hypoxic, cbh low got dextrose, ivf changed Labs reviewed potassium 3.1 hemoglobin stable fairly   Assessment and Plan: Principal Problem:   Aspiration pneumonia (HCC) Active Problems:   Severe sepsis (HCC)   Upper GI bleed   Esophagitis   Benign hypertension   Chronic diastolic CHF (congestive heart failure) (HCC)   Arteriosclerosis of coronary artery   Acute metabolic encephalopathy   HLD (hyperlipidemia)   CREST (calcinosis, Raynaud's phenomenon, esophageal dysfunction, sclerodactyly, telangiectasia) (HCC)   History of CVA (cerebrovascular accident)   Dementia ([place])   Depression with anxiety   Protein-calorie malnutrition, severe (HCC)   Severe sepsis due to aspiration pneumonia Aspiration pneumonia:   CTA showed right upper lobe infiltration. Pt may have aspiration pneumonia versus HCAP.  She met criteria for s severe sepsis with WBC 25.0, heart rate 121, lactic acid 4.5-->1.1.  Off vancomycin continue Zosyn, Mucinex, bronchodilator.  Follow-up culture data so far negative.  Has not eaten for few days, speech eval requested Recent Labs  Lab 10/09/22 0723 10/09/22 0906 10/09/22 1221 10/09/22 1513 10/10/22 0028 10/10/22 0800 10/10/22 1635 10/11/22 0404  WBC 25.0*  --   --  20.0* 9.6 13.7* 13.2* 10.6*  LATICACIDVEN 4.5* 3.9* 2.4* 1.1  --   --   --   --   PROCALCITON 0.46  --   --   --   --   --   --   --     Hematemesis due to esophagitis: Continue PPI twice daily, antiemetics avoid NSAIDs heparin monitor hb overall remains stable.11.5. GI consult with Dr. Vicente Males appreciated- given aspiration pneumonia hold off on invasive procedure unless patient starts rebleeding Recent Labs  Lab 10/09/22 1513 10/10/22 0028 10/10/22 0800 10/10/22 1635 10/11/22 0404  HGB 10.3* 6.4* 11.5* 12.0 11.3*  HCT 32.3* 20.6* 36.2 37.8 35.6*    Hypoglycemic due to n.p.o. status, starting IVF with dextrose Recent Labs  Lab 10/09/22 0659 10/10/22 0800 10/11/22 0842 10/11/22 0950  GLUCAP 188* 92 51* 180*    Benign essential hypertension BP well-controlled continue holding antihypertensive Chronic diastolic CHF echo reviewed from 2019 EF 60 to 65% currently no lower leg edema no respiratory symptoms BNP 82, euvolemic   Arteriosclerosis of coronary artery: Hyperlipidemia History of CVA: hold ASA,continue Zetia  Acute metabolic encephalopathy Severe dementia at baseline alert awake oriented 0-1: CT head negative.  She has severe dementia has been  bedbound since her fracture/surgery in January.  Continue supportive care delirium precaution    CREST (calcinosis, Raynaud's phenomenon, esophageal dysfunction, sclerodactyly, telangiectasia):  -holding her Plaquenil due to severe sepsis  Anxiety/depression  continue home meds as tolerated  Severe protein-calorie malnutrition/failure to thrive: bmi 12 Augment diet as tolerated over reports he does not appear bright  Goals of care DNR, request palliative care evaluation over prognosis appears poor.  Husband is in agreement   DVT ppx: SCD  DVT prophylaxis: SCDs Start: 10/09/22 0933 Code Status:   Code Status: DNR Family Communication: plan of care discussed with her husbandat bedside. Patient status is: inpatietn  because of GI bleedingNH Level of care: Progressive   Dispo: The patient is from:NH            Anticipated disposition: tbd Objective: Vitals last 24 hrs: Vitals:   10/10/22 2346 10/11/22 0326 10/11/22 0844 10/11/22 0922  BP: 132/78 (!) 141/63 134/66   Pulse: 96 79 98   Resp: '17 17 16   '$ Temp: 97.7 F (36.5 C) 98.6 F (37 C) 98.2 F (36.8 C)   TempSrc:   Oral   SpO2: 94% 96% 91%   Weight:    32.8 kg  Height:       Weight change:   Physical Examination: General exam: alert awake, oriented x 0, mumbling words HEENT:Oral mucosa moist, Ear/Nose WNL grossly Respiratory system: bilaterally clear BS, no use of accessory muscle Cardiovascular system: S1 & S2 +, No JVD. Gastrointestinal system: Abdomen soft,NT,ND, BS+ Nervous System:Alert, awake, moving extremities. Extremities: LE edema neg thin,distal peripheral pulses palpable.  Skin: No rashes,no icterus. MSK: Normal muscle bulk,tone, power  Medications reviewed:  Scheduled Meds:  ezetimibe  10 mg Oral Daily   ferrous sulfate  325 mg Oral Q breakfast   mirtazapine  15 mg Oral QHS   multivitamin with minerals  1 tablet Oral Daily   OLANZapine  2.5 mg Oral QHS   pantoprazole (PROTONIX) IV  40 mg Intravenous Q12H   senna  1 tablet Oral BID   venlafaxine XR  75 mg Oral Daily  Continuous Infusions:  dextrose 5 % and 0.9% NaCl 75 mL/hr at 10/11/22 0957   piperacillin-tazobactam (ZOSYN)  IV 12.5 mL/hr at 10/11/22 0700   potassium chloride 10 mEq (10/11/22 1002)     Diet Order             Diet NPO time specified Except for: Ice Chips, Sips with Meds  Diet effective now                   Intake/Output Summary (Last 24 hours) at 10/11/2022 1124 Last data filed at 10/11/2022 [id number] Gross per 24 hour  Intake 2176.23 ml  Output 1650 ml  Net 526.23 ml   Net IO Since Admission: 2,725.23 mL [10/11/22 1124]  Wt Readings from Last 3 Encounters:  10/11/22 32.8 kg  09/11/22 42.1 kg  04/27/22 35.8 kg   Unresulted Labs (From admission, onward)     Start     Ordered   10/12/22 [id number]  Basic metabolic panel  Daily,   R      10/11/22 0832   10/12/22 0500  CBC  Daily,   R      10/11/22 0832   10/10/22 1700  CBC  Now then every 12 hours,   R (with TIMED occurrences)      10/10/22 1028   10/09/22 0933  Expectorated Sputum Assessment w Gram Stain, Rflx to Resp Cult  (COPD /  Pneumonia / Cellulitis / Lower Extremity Wound)  Once,   R        10/09/22 0933          Data Reviewed: I have personally reviewed following labs and imaging studies CBC: Recent Labs  Lab 10/09/22 0723 10/09/22 1513 10/10/22 0028 10/10/22 0800 10/10/22 1635 10/11/22 0404  WBC 25.0* 20.0* 9.6 13.7* 13.2* 10.6*  NEUTROABS 23.0*  --   --   --   --   --   HGB 13.7 10.3* 6.4* 11.5* 12.0 11.3*  HCT 44.9 32.3* 20.6* 36.2 37.8 35.6*  MCV 84.7 82.2 85.5 83.6 83.8 83.4  PLT 473* 326 184 251 276 [id number]   Basic Metabolic Panel: Recent Labs  Lab 10/09/22 0723 10/10/22 0800 10/11/22 0404  NA 138 141 140  K 3.7 3.0* 3.1*  CL 95* 103 103  CO2 '28 28 23  '$ GLUCOSE 219* 93 56*  BUN 37* 17 14  CREATININE 0.90 0.51 0.58  CALCIUM 9.5 8.5* 8.6*  MG  --   --  1.8   GFR: Estimated Creatinine Clearance: 28.6 mL/min (by C-G formula based on SCr of 0.58 mg/dL). Liver Function Tests: Recent Labs  Lab 10/09/22 0723  AST 35  ALT 24  ALKPHOS 210*  BILITOT 0.5  PROT 7.1  ALBUMIN 3.3*   Recent Labs  Lab 10/09/22 0723  LIPASE 26   Recent Labs  Lab 10/09/22 0723  INR 1.1   CBG: Recent Labs  Lab 10/09/22 0659 10/10/22 0800 10/11/22 0842 10/11/22 0950  GLUCAP 188* 92 51* 180*   Recent Labs  Lab 10/09/22 0723 10/09/22 0906 10/09/22 1221 10/09/22 1513  PROCALCITON 0.46  --   --   --   LATICACIDVEN 4.5* 3.9* 2.4* 1.1   Recent Results (from the past 240 hour(s))  Blood Culture (routine x 2)     Status: None (Preliminary result)   Collection Time: 10/09/22  7:23 AM   Specimen: BLOOD  Result Value Ref Range Status   Specimen Description BLOOD BLOOD LEFT FOREARM  Final   Special Requests   Final    BOTTLES DRAWN AEROBIC AND ANAEROBIC Blood Culture adequate volume   Culture   Final    NO GROWTH 2 DAYS Performed at [facility], [address] [place], [place] [address]    Report Status PENDING  Incomplete  Blood Culture (routine x 2)     Status: None (Preliminary result)   Collection Time: 10/09/22  7:23 AM   Specimen: BLOOD  Result Value Ref Range Status   Specimen Description BLOOD RIGHT ANTECUBITAL  Final   Special Requests   Final    BOTTLES DRAWN AEROBIC AND ANAEROBIC Blood Culture adequate volume   Culture   Final    NO GROWTH 2 DAYS Performed at [facility], [address], [place], [place] [address]    Report Status PENDING  Incomplete  Resp panel by RT-PCR (RSV, Flu A&B, Covid) Anterior Nasal Swab     Status: None   Collection Time: 10/09/22 10:28 AM   Specimen: Anterior Nasal Swab  Result Value Ref Range Status   SARS Coronavirus 2 by RT PCR NEGATIVE NEGATIVE Final    Comment: (NOTE) SARS-CoV-2 target nucleic acids are NOT DETECTED.  The SARS-CoV-2 RNA is generally detectable in upper respiratory specimens during the acute phase of infection. The lowest concentration of SARS-CoV-2 viral copies this assay can detect is 138 copies/mL. A negative result does not preclude SARS-Cov-2 infection and should not be used as  the sole basis for treatment or other patient management decisions. A negative result may  occur with  improper specimen collection/handling, submission of specimen other than nasopharyngeal swab, presence of viral mutation(s) within the areas targeted by this assay, and inadequate number of viral copies(<138 copies/mL). A negative result must be combined with clinical observations, patient history, and epidemiological information. The expected result is Negative.  Fact Sheet for Patients:  EntrepreneurPulse.com.au  Fact Sheet for Healthcare Providers:  IncredibleEmployment.be  This test is no t yet approved or cleared by the [place] FDA and  has been authorized for detection and/or diagnosis of SARS-CoV-2 by FDA under an Emergency Use Authorization (EUA). This EUA will remain  in effect (meaning this test can be used) for the duration of the COVID-19 declaration under Section 564(b)(1) of the Act, 21 U.S.C.section 360bbb-3(b)(1), unless the authorization is terminated  or revoked sooner.       Influenza A by PCR NEGATIVE NEGATIVE Final   Influenza B by PCR NEGATIVE NEGATIVE Final    Comment: (NOTE) The Xpert Xpress SARS-CoV-2/FLU/RSV plus assay is intended as an aid in the diagnosis of influenza from Nasopharyngeal swab specimens and should not be used as a sole basis for treatment. Nasal washings and aspirates are unacceptable for Xpert Xpress SARS-CoV-2/FLU/RSV testing.  Fact Sheet for Patients: EntrepreneurPulse.com.au  Fact Sheet for Healthcare Providers: IncredibleEmployment.be  This test is not yet approved or cleared by the [place] FDA and has been authorized for detection and/or diagnosis of SARS-CoV-2 by FDA under an Emergency Use Authorization (EUA). This EUA will remain in effect (meaning this test can be used) for the duration of the COVID-19 declaration under Section 564(b)(1) of the Act, 21 U.S.C. section 360bbb-3(b)(1), unless the authorization is terminated  or revoked.     Resp Syncytial Virus by PCR NEGATIVE NEGATIVE Final    Comment: (NOTE) Fact Sheet for Patients: EntrepreneurPulse.com.au  Fact Sheet for Healthcare Providers: IncredibleEmployment.be  This test is not yet approved or cleared by the [place] FDA and has been authorized for detection and/or diagnosis of SARS-CoV-2 by FDA under an Emergency Use Authorization (EUA). This EUA will remain in effect (meaning this test can be used) for the duration of the COVID-19 declaration under Section 564(b)(1) of the Act, 21 U.S.C. section 360bbb-3(b)(1), unless the authorization is terminated or revoked.  Performed at [facility], [address] [place], [place] [address]   MRSA Next Gen by PCR, Nasal     Status: Abnormal   Collection Time: 10/09/22 12:04 PM   Specimen: Nasal Mucosa; Nasal Swab  Result Value Ref Range Status   MRSA by PCR Next Gen DETECTED (A) NOT DETECTED Final    Comment: RESULT CALLED TO, READ BACK BY AND VERIFIED WITH: ELAINA brockman 10/09/22 1416 mw (NOTE) The GeneXpert MRSA Assay (FDA approved for NASAL specimens only), is one component of a comprehensive MRSA colonization surveillance program. It is not intended to diagnose MRSA infection nor to guide or monitor treatment for MRSA infections. Test performance is not FDA approved in patients less than 59 years old. Performed at [facility], [address] [place],  [address]     Antimicrobials: Anti-infectives (From admission, onward)    Start     Dose/Rate Route Frequency Ordered Stop   10/11/22 2100  vancomycin (VANCOREADY) IVPB 750 mg/150 mL  Status:  Discontinued        750 mg 150 mL/hr over 60 Minutes Intravenous Every 48 hours 10/09/22 1009 10/10/22 1033  10/09/22 1600  piperacillin-tazobactam (ZOSYN) IVPB 3.375 g        3.375 g 12.5 mL/hr over 240 Minutes Intravenous Every 8 hours 10/09/22 1009     10/09/22 0830   vancomycin (VANCOCIN) IVPB 1000 mg/200 mL premix        1,000 mg 200 mL/hr over 60 Minutes Intravenous  Once 10/09/22 0822 10/09/22 1006   10/09/22 0830  piperacillin-tazobactam (ZOSYN) IVPB 3.375 g        3.375 g 100 mL/hr over 30 Minutes Intravenous  Once 10/09/22 0822 10/09/22 0906      Culture/Microbiology    Component Value Date/Time   SDES BLOOD BLOOD LEFT FOREARM 10/09/2022 0723   SDES BLOOD RIGHT ANTECUBITAL 10/09/2022 0723   SPECREQUEST  10/09/2022 0723    BOTTLES DRAWN AEROBIC AND ANAEROBIC Blood Culture adequate volume   SPECREQUEST  10/09/2022 0723    BOTTLES DRAWN AEROBIC AND ANAEROBIC Blood Culture adequate volume   CULT  10/09/2022 0723    NO GROWTH 2 DAYS Performed at [facility], [address] [place], [place] [address]    CULT  10/09/2022 [phone]    NO GROWTH 2 DAYS Performed at [facility], [address] [place],  [place] [address]    REPTSTATUS PENDING 10/09/2022 0723   REPTSTATUS PENDING 10/09/2022 [id number]    Radiology Studies: No results found.   LOS: 2 days   Antonieta Pert, MD [facility]  10/11/2022, 11:24 AM

## 2022-10-12 DIAGNOSIS — Z515 Encounter for palliative care: Secondary | ICD-10-CM

## 2022-10-12 DIAGNOSIS — F01518 Vascular dementia, unspecified severity, with other behavioral disturbance: Secondary | ICD-10-CM | POA: Diagnosis not present

## 2022-10-12 DIAGNOSIS — J690 Pneumonitis due to inhalation of food and vomit: Secondary | ICD-10-CM | POA: Diagnosis not present

## 2022-10-12 DIAGNOSIS — R4182 Altered mental status, unspecified: Secondary | ICD-10-CM | POA: Diagnosis not present

## 2022-10-12 DIAGNOSIS — Z7189 Other specified counseling: Secondary | ICD-10-CM

## 2022-10-12 DIAGNOSIS — K209 Esophagitis, unspecified without bleeding: Secondary | ICD-10-CM | POA: Diagnosis not present

## 2022-10-12 LAB — BASIC METABOLIC PANEL
Anion gap: 8 (ref 5–15)
BUN: 7 mg/dL — ABNORMAL LOW (ref 8–23)
CO2: 30 mmol/L (ref 22–32)
Calcium: 8.5 mg/dL — ABNORMAL LOW (ref 8.9–10.3)
Chloride: 102 mmol/L (ref 98–111)
Creatinine, Ser: 0.54 mg/dL (ref 0.44–1.00)
GFR, Estimated: >60 mL/min (ref >60)
Glucose, Bld: 140 mg/dL — ABNORMAL HIGH (ref 70–99)
Potassium: 2.9 mmol/L — ABNORMAL LOW (ref 3.5–5.1)
Sodium: 140 mmol/L (ref 135–145)

## 2022-10-12 LAB — POTASSIUM: Potassium: 4.7 mmol/L (ref 3.5–5.1)

## 2022-10-12 LAB — CBC
HCT: 40.1 % (ref 36.0–46.0)
Hemoglobin: 13 g/dL (ref 12.0–15.0)
MCH: 26.5 pg (ref 26.0–34.0)
MCHC: 32.4 g/dL (ref 30.0–36.0)
MCV: 81.8 fL (ref 80.0–100.0)
Platelets: 288 10*3/uL (ref 150–400)
RBC: 4.9 MIL/uL (ref 3.87–5.11)
RDW: 15.8 % — ABNORMAL HIGH (ref 11.5–15.5)
WBC: 9.9 10*3/uL (ref 4.0–10.5)
nRBC: 0 % (ref 0.0–0.2)

## 2022-10-12 LAB — GLUCOSE, CAPILLARY
Glucose-Capillary: 99 mg/dL (ref 70–99)
Glucose-Capillary: 135 mg/dL — ABNORMAL HIGH (ref 70–99)

## 2022-10-12 LAB — MAGNESIUM: Magnesium: 1.7 mg/dL (ref 1.7–2.4)

## 2022-10-12 MED ORDER — POTASSIUM CHLORIDE 20 MEQ PO PACK: 40.0000 meq | PACK | Freq: Once | ORAL | Status: DC

## 2022-10-12 MED ORDER — MUPIROCIN 2 % EX OINT
1.0000 | TOPICAL_OINTMENT | Freq: Two times a day (BID) | CUTANEOUS | Status: DC
  Administered 2022-10-12 – 2022-10-13 (×3): 1 via NASAL
  Filled 2022-10-12: qty 22

## 2022-10-12 MED ORDER — KCL IN DEXTROSE-NACL 20-5-0.9 MEQ/L-%-% IV SOLN
INTRAVENOUS | Status: DC
  Filled 2022-10-12 (×2): qty 1000

## 2022-10-12 MED ORDER — POTASSIUM CHLORIDE CRYS ER 20 MEQ PO TBCR
20.0000 meq | EXTENDED_RELEASE_TABLET | Freq: Once | ORAL | Status: AC
  Administered 2022-10-12: 20 meq via ORAL
  Filled 2022-10-12: qty 1

## 2022-10-12 MED ORDER — POTASSIUM CHLORIDE 10 MEQ/100ML IV SOLN
10.0000 meq | INTRAVENOUS | Status: AC
  Administered 2022-10-12 (×6): 10 meq via INTRAVENOUS
  Filled 2022-10-12 (×6): qty 100

## 2022-10-12 MED ORDER — CHLORHEXIDINE GLUCONATE CLOTH 2 % EX PADS
6.0000 | MEDICATED_PAD | Freq: Every day | CUTANEOUS | Status: DC
  Administered 2022-10-12 – 2022-10-13 (×2): 6 via TOPICAL

## 2022-10-12 NOTE — Progress Notes (Signed)
PROGRESS NOTE Kathleen Walls  [id number] DOB: 1941/06/10 DOA: 82/26/2024 PCP: Bonnita Nasuti, MD   Brief Narrative/Hospital Course: 82F, hx of dementia, HTN, HLD, prediabetes, CAD with stent placement, stroke/TIA, GERD, depression with anxiety, kidney stone, breast cancer, diverticulitis, CREST syndrome, who presents with hematemesis and found to have Hgb 13.7 (9.3 on 09/14/22), severe sepsis due to possible aspiration pneumonia.  Underwent CT and CT abdomen pelvis.  CT scan of abdomen/pelvis that showed esophagitis.  CTA negative for PE. Started protonix,zosyn.  Patient was admitted for severe sepsis with aspiration pneumonia, hematemesis.  Seen by gastroenterology who advised unless patient has a hemorrhage or significant bleed would not perform EGD at this time due to pneumonia advised to wait for a week at least if not longer and advise goals of care suggestion discussion Patient seen by palliative care in January 2024 will benefit from follow-up as outpatient given overall poor long-term prognosis.    Subjective: Seen and examined this morning confused, husband at bedside Noted potassium still again low this morning again.  Iv an oral supplement ordered nurse notified She did eat some last night.  Assessment and Plan: Principal Problem:   Aspiration pneumonia ([place]) Active Problems:   Severe sepsis (HCC)   Upper GI bleed   Esophagitis   Benign hypertension   Chronic diastolic CHF (congestive heart failure) (HCC)   Arteriosclerosis of coronary artery   Acute metabolic encephalopathy   HLD (hyperlipidemia)   CREST (calcinosis, Raynaud's phenomenon, esophageal dysfunction, sclerodactyly, telangiectasia) (HCC)   History of CVA (cerebrovascular accident)   Dementia ([place])   Depression with anxiety   Protein-calorie malnutrition, severe (HCC)   Severe sepsis due to aspiration pneumonia Aspiration pneumonia:  CTA showed right upper lobe infiltration. Pt may have aspiration pneumonia  versus HCAP.  She met criteria for s severe sepsis with WBC 25.0, heart rate 121, lactic acid 4.5-->1.1.  Off vancomycin> overall respiratory status is stable, leukocytosis resolved.  Blood culture no growth so far, no sputum for culture, strep/Legionella antigen negative. Continue Zosyn, Mucinex, bronchodilator. PTOT SLP to follow  Recent Labs  Lab 10/09/22 0723 10/09/22 0906 10/09/22 1221 10/09/22 1513 10/10/22 0028 10/10/22 0800 10/10/22 1635 10/11/22 0404 10/11/22 1705 10/12/22 0547  WBC 25.0*  --   --  20.0*   < > 13.7* 13.2* 10.6* 11.3* 9.9  LATICACIDVEN 4.5* 3.9* 2.4* 1.1  --   --   --   --   --   --   PROCALCITON 0.46  --   --   --   --   --   --   --   --   --    < > = values in this interval not displayed.    Hypokalemia: Will replete aggressively p.o. IV and also add on IVF recheck. Check mag today Recent Labs  Lab 10/09/22 0723 10/10/22 0800 10/11/22 0404 10/12/22 0547  K 3.7 3.0* 3.1* 2.9*  CALCIUM 9.5 8.5* 8.6* 8.5*  MG  --   --  1.8  --     Hematemesis due to esophagitis: h/h stable, no recurrence.Cont PPI twice daily, antiemetics avoid NSAIDs heparin -seen by Dr Vicente Males appreciated- given aspiration pneumonia he is holding off on invasive procedure unless patient starts rebleeding Recent Labs  Lab 10/10/22 0800 10/10/22 1635 10/11/22 0404 10/11/22 1705 10/12/22 0547  HGB 11.5* 12.0 11.3* 12.5 13.0  HCT 36.2 37.8 35.6* 39.0 40.1   Hypoglycemic due to n.p.o. status, add kcl in ivf w/ dextrose. On diet now Recent Labs  Lab 10/11/22 0842 10/11/22 0950 10/11/22 1132 10/11/22 1615 10/12/22 0846  GLUCAP 51* 180* 181* 124* 99     Benign essential hypertension BP well-controlled continue holding antihypertensive  Chronic diastolic CHF echo reviewed from 2019 EF 60 to 65% currently no lower leg edema no respiratory symptoms BNP 82, euvolemic- needing ivf as not eating po well.   Arteriosclerosis of coronary artery: Hyperlipidemia History of CVA: holding  ASA,continue Zetia  Acute metabolic encephalopathy Severe dementia at baseline alert awake oriented 0-1: CT head negative.  She has severe dementia has been bedbound since her fracture/surgery in January.  Remains debilitated weak and deconditioned confused, continue supportive care delirium precaution    CREST (calcinosis, Raynaud's phenomenon, esophageal dysfunction, sclerodactyly, telangiectasia):  -holding her Plaquenil due to severe sepsis  Anxiety/depression continue home meds as tolerated  Severe protein-calorie malnutrition/failure to thrive: bmi 12 Augment diet as tolerated over reports he does not appear bright  Goals of care DNR, request palliative care evaluation over prognosis appears poor.  Husband is in agreement awaiting for palliative evaluation for further disposition   DVT ppx: SCD  DVT prophylaxis: SCDs Start: 10/09/22 0933 Code Status:   Code Status: DNR Family Communication: plan of care discussed with her husbandat bedside. Patient status is: inpatietn  because of GI bleedingNH Level of care: Progressive   Dispo: The patient is from:NH            Anticipated disposition: tbd Objective: Vitals last 24 hrs: Vitals:   10/11/22 2009 10/12/22 0009 10/12/22 0442 10/12/22 0845  BP: (!) 146/78 (!) 151/93 136/80 134/86  Pulse: 100 97 (!) 103 (!) 103  Resp: '17 17 20 16  '$ Temp: 98.7 F (37.1 C) 98.1 F (36.7 C) 98.7 F (37.1 C) 97.7 F (36.5 C)  TempSrc:      SpO2: 96% 99% 99% 100%  Weight:      Height:       Weight change:   Physical Examination: General exam: Mumbling words eyes closed  HEENT:Oral mucosa moist, Ear/Nose WNL grossly, dentition normal. Respiratory system: bilaterally clear BS, no use of accessory muscle Cardiovascular system: S1 & S2 +, regular rate. Gastrointestinal system: Abdomen soft, NT,ND,BS+ Nervous System: Eyes closed mumbling words oriented x 0  Extremities: LE ankle edema , lower extremities warm, in FETAL position.   Skin: No  rashes,no icterus. MSK: SMALL/THIN WEAK muscle bulk,tone, power   Medications reviewed:  Scheduled Meds:  Chlorhexidine Gluconate Cloth  6 each Topical Q0600   ezetimibe  10 mg Oral Daily   ferrous sulfate  325 mg Oral Q breakfast   mirtazapine  15 mg Oral QHS   multivitamin with minerals  1 tablet Oral Daily   mupirocin ointment  1 Application Nasal BID   OLANZapine  2.5 mg Oral QHS   pantoprazole (PROTONIX) IV  40 mg Intravenous Q12H   potassium chloride  20 mEq Oral Once   senna  1 tablet Oral BID   venlafaxine XR  75 mg Oral Daily  Continuous Infusions:  dextrose 5 % and 0.9% NaCl Stopped (10/12/22 0542)   piperacillin-tazobactam (ZOSYN)  IV 12.5 mL/hr at 10/12/22 0700   potassium chloride      Diet Order             DIET DYS 2 Room service appropriate? No; Fluid consistency: Thin  Diet effective now                   Intake/Output Summary (Last 24 hours) at 10/12/2022 0852 Last  data filed at 10/12/2022 0700 Gross per 24 hour  Intake 1473.62 ml  Output 350 ml  Net 1123.62 ml    Net IO Since Admission: 4,198.85 mL [10/12/22 0852]  Wt Readings from Last 3 Encounters:  10/11/22 32.8 kg  09/11/22 42.1 kg  04/27/22 35.8 kg   Unresulted Labs (From admission, onward)     Start     Ordered   10/12/22 [id number]  Basic metabolic panel  Daily,   R      10/11/22 0832   10/12/22 0500  CBC  Daily,   R      10/11/22 0832   10/09/22 0933  Expectorated Sputum Assessment w Gram Stain, Rflx to Resp Cult  (COPD / Pneumonia / Cellulitis / Lower Extremity Wound)  Once,   R        10/09/22 0933          Data Reviewed: I have personally reviewed following labs and imaging studies CBC: Recent Labs  Lab 10/09/22 0723 10/09/22 1513 10/10/22 0800 10/10/22 1635 10/11/22 0404 10/11/22 1705 10/12/22 0547  WBC 25.0*   < > 13.7* 13.2* 10.6* 11.3* 9.9  NEUTROABS 23.0*  --   --   --   --   --   --   HGB 13.7   < > 11.5* 12.0 11.3* 12.5 13.0  HCT 44.9   < > 36.2 37.8 35.6* 39.0  40.1  MCV 84.7   < > 83.6 83.8 83.4 81.9 81.8  PLT 473*   < > 251 276 265 292 288   < > = values in this interval not displayed.    Basic Metabolic Panel: Recent Labs  Lab 10/09/22 0723 10/10/22 0800 10/11/22 0404 10/12/22 0547  NA 138 141 140 140  K 3.7 3.0* 3.1* 2.9*  CL 95* 103 103 102  CO2 '28 28 23 30  '$ GLUCOSE 219* 93 56* 140*  BUN 37* 17 14 7*  CREATININE 0.90 0.51 0.58 0.54  CALCIUM 9.5 8.5* 8.6* 8.5*  MG  --   --  1.8  --     GFR: Estimated Creatinine Clearance: 28.6 mL/min (by C-G formula based on SCr of 0.54 mg/dL). Liver Function Tests: Recent Labs  Lab 10/09/22 0723  AST 35  ALT 24  ALKPHOS 210*  BILITOT 0.5  PROT 7.1  ALBUMIN 3.3*    Recent Labs  Lab 10/09/22 0723  LIPASE 26    Recent Labs  Lab 10/09/22 0723  INR 1.1   CBG: Recent Labs  Lab 10/11/22 0842 10/11/22 0950 10/11/22 1132 10/11/22 1615 10/12/22 0846  GLUCAP 51* 180* 181* 124* 99    Recent Labs  Lab 10/09/22 0723 10/09/22 0906 10/09/22 1221 10/09/22 1513  PROCALCITON 0.46  --   --   --   LATICACIDVEN 4.5* 3.9* 2.4* 1.1    Recent Results (from the past 240 hour(s))  Blood Culture (routine x 2)     Status: None (Preliminary result)   Collection Time: 10/09/22  7:23 AM   Specimen: BLOOD  Result Value Ref Range Status   Specimen Description BLOOD BLOOD LEFT FOREARM  Final   Special Requests   Final    BOTTLES DRAWN AEROBIC AND ANAEROBIC Blood Culture adequate volume   Culture   Final    NO GROWTH 3 DAYS Performed at [facility], [address] [place], [place] [address]    Report Status PENDING  Incomplete  Blood Culture (routine x 2)     Status: None (Preliminary result)  Collection Time: 10/09/22  7:23 AM   Specimen: BLOOD  Result Value Ref Range Status   Specimen Description BLOOD RIGHT ANTECUBITAL  Final   Special Requests   Final    BOTTLES DRAWN AEROBIC AND ANAEROBIC Blood Culture adequate volume   Culture   Final    NO GROWTH 3  DAYS Performed at [facility], [address] [place], [place] [address]    Report Status PENDING  Incomplete  Resp panel by RT-PCR (RSV, Flu A&B, Covid) Anterior Nasal Swab     Status: None   Collection Time: 10/09/22 10:28 AM   Specimen: Anterior Nasal Swab  Result Value Ref Range Status   SARS Coronavirus 2 by RT PCR NEGATIVE NEGATIVE Final    Comment: (NOTE) SARS-CoV-2 target nucleic acids are NOT DETECTED.  The SARS-CoV-2 RNA is generally detectable in upper respiratory specimens during the acute phase of infection. The lowest concentration of SARS-CoV-2 viral copies this assay can detect is 138 copies/mL. A negative result does not preclude SARS-Cov-2 infection and should not be used as the sole basis for treatment or other patient management decisions. A negative result may occur with  improper specimen collection/handling, submission of specimen other than nasopharyngeal swab, presence of viral mutation(s) within the areas targeted by this assay, and inadequate number of viral copies(<138 copies/mL). A negative result must be combined with clinical observations, patient history, and epidemiological information. The expected result is Negative.  Fact Sheet for Patients:  EntrepreneurPulse.com.au  Fact Sheet for Healthcare Providers:  IncredibleEmployment.be  This test is no t yet approved or cleared by the [place] FDA and  has been authorized for detection and/or diagnosis of SARS-CoV-2 by FDA under an Emergency Use Authorization (EUA). This EUA will remain  in effect (meaning this test can be used) for the duration of the COVID-19 declaration under Section 564(b)(1) of the Act, 21 U.S.C.section 360bbb-3(b)(1), unless the authorization is terminated  or revoked sooner.       Influenza A by PCR NEGATIVE NEGATIVE Final   Influenza B by PCR NEGATIVE NEGATIVE Final    Comment: (NOTE) The Xpert Xpress  SARS-CoV-2/FLU/RSV plus assay is intended as an aid in the diagnosis of influenza from Nasopharyngeal swab specimens and should not be used as a sole basis for treatment. Nasal washings and aspirates are unacceptable for Xpert Xpress SARS-CoV-2/FLU/RSV testing.  Fact Sheet for Patients: EntrepreneurPulse.com.au  Fact Sheet for Healthcare Providers: IncredibleEmployment.be  This test is not yet approved or cleared by the [place] FDA and has been authorized for detection and/or diagnosis of SARS-CoV-2 by FDA under an Emergency Use Authorization (EUA). This EUA will remain in effect (meaning this test can be used) for the duration of the COVID-19 declaration under Section 564(b)(1) of the Act, 21 U.S.C. section 360bbb-3(b)(1), unless the authorization is terminated or revoked.     Resp Syncytial Virus by PCR NEGATIVE NEGATIVE Final    Comment: (NOTE) Fact Sheet for Patients: EntrepreneurPulse.com.au  Fact Sheet for Healthcare Providers: IncredibleEmployment.be  This test is not yet approved or cleared by the [place] FDA and has been authorized for detection and/or diagnosis of SARS-CoV-2 by FDA under an Emergency Use Authorization (EUA). This EUA will remain in effect (meaning this test can be used) for the duration of the COVID-19 declaration under Section 564(b)(1) of the Act, 21 U.S.C. section 360bbb-3(b)(1), unless the authorization is terminated or revoked.  Performed at [facility], [address], [place], [place] [address]   MRSA Next Gen  by PCR, Nasal     Status: Abnormal   Collection Time: 10/09/22 12:04 PM   Specimen: Nasal Mucosa; Nasal Swab  Result Value Ref Range Status   MRSA by PCR Next Gen DETECTED (A) NOT DETECTED Final    Comment: RESULT CALLED TO, READ BACK BY AND VERIFIED WITH: ELAINA brockman 10/09/22 1416 mw (NOTE) The GeneXpert MRSA Assay (FDA approved for  NASAL specimens only), is one component of a comprehensive MRSA colonization surveillance program. It is not intended to diagnose MRSA infection nor to guide or monitor treatment for MRSA infections. Test performance is not FDA approved in patients less than 41 years old. Performed at [facility], [address] [place], [place] [address]     Antimicrobials: Anti-infectives (From admission, onward)    Start     Dose/Rate Route Frequency Ordered Stop   10/11/22 2100  vancomycin (VANCOREADY) IVPB 750 mg/150 mL  Status:  Discontinued        750 mg 150 mL/hr over 60 Minutes Intravenous Every 48 hours 10/09/22 1009 10/10/22 1033   10/09/22 1600  piperacillin-tazobactam (ZOSYN) IVPB 3.375 g        3.375 g 12.5 mL/hr over 240 Minutes Intravenous Every 8 hours 10/09/22 1009     10/09/22 0830  vancomycin (VANCOCIN) IVPB 1000 mg/200 mL premix        1,000 mg 200 mL/hr over 60 Minutes Intravenous  Once 10/09/22 0822 10/09/22 1006   10/09/22 0830  piperacillin-tazobactam (ZOSYN) IVPB 3.375 g        3.375 g 100 mL/hr over 30 Minutes Intravenous  Once 10/09/22 0822 10/09/22 0906      Culture/Microbiology    Component Value Date/Time   SDES BLOOD BLOOD LEFT FOREARM 10/09/2022 0723   SDES BLOOD RIGHT ANTECUBITAL 10/09/2022 0723   SPECREQUEST  10/09/2022 0723    BOTTLES DRAWN AEROBIC AND ANAEROBIC Blood Culture adequate volume   SPECREQUEST  10/09/2022 0723    BOTTLES DRAWN AEROBIC AND ANAEROBIC Blood Culture adequate volume   CULT  10/09/2022 0723    NO GROWTH 3 DAYS Performed at [facility], [address] [place], [place] [address]    CULT  10/09/2022 [phone]    NO GROWTH 3 DAYS Performed at [facility], [address] [place], [place] [address]    REPTSTATUS PENDING 10/09/2022 0723   REPTSTATUS PENDING 10/09/2022 [id number]    Radiology Studies: No results found.   LOS: 3 days   Antonieta Pert, MD [facility]  10/12/2022, 8:52 AM

## 2022-10-12 NOTE — Consult Note (Signed)
Consultation Note Date: 10/12/2022   Patient Name: Kathleen Walls  DOB: 1941/03/07  MRN: [id number]  Age / Sex: 82 y.o., female  PCP: Bonnita Nasuti, MD Referring Physician: Antonieta Pert, MD  Reason for Consultation: Establishing goals of care   HPI/Brief Hospital Course: 82 y.o. female  with past medical history of HTN, HLD< CAD s/p stent placement, stroke/TIA, depression and anxiety, breast cancer, CREST syndrome and dementia admitted from [facility] on 10/09/2022 with hematemesis found to have severe sepsis secondary to aspiration pneumonia. Noted recent hospitalization in January for left hip fracture, underwent surgical repair.   Palliative medicine was consulted for assisting with goals of care conversations given overall poor prognosis long-term.  Subjective:  Extensive chart review has been completed prior to meeting patient including labs, vital signs, imaging, progress notes, orders, and available advanced directive documents from current and previous encounters.  Introduced myself as a Designer, jewellery as a member of the palliative care team. Explained palliative medicine is specialized medical care for people living with serious illness. It focuses on providing relief from the symptoms and stress of a serious illness. The goal is to improve quality of life for both the patient and the family.   Visited with Kathleen Walls at her bedside. Nursing staff assisting with feeding her breakfast. Eyes remained closed but active participant in eating, noted coughing with liquids.  Husband-Kathleen Walls at bedside. Shares a brief life review. He [name] have been married for over 30 years. They enjoyed traveling, being active and dancing. Spent quite a bit of time at [employer] with friends. Shares that Kathleen Walls has been a resident at [facility] since the summer-previously living at home but had increased episodes of wandering into the busy street outside of  their home. Kathleen Walls shares that Kathleen Walls recovered well from her hip surgery, spent a short stay in acute rehab but was discharged back to [facility].  He shares he has noticed a significant decline in her overall function since last admission to hospital in January. Appetite continues to fluctuate but has noticed a recent decline as well as a noted weight loss.  Kathleen Walls hopes for Kathleen Walls to have a meaningful recovery but is also realistic in understanding dementia disease trajectory. He has cared for several family members with dementia and understands the expected outcomes. He hopes Kathleen Walls gets back to a place where she can interact with him and ambulate with a walker as she previously ambulated independently. He shares she would not enjoy life wheelchair bound. We discussed recent hospitalizations with noted increased weakness and worsening mentation-unlikely to progress or be able to be an active participant in therapy services. Kathleen Walls voices understanding but wishes to allow Kathleen Walls to attempt participation.  We discussed underlying diease process of aspiration pneumonia-can potentially be treated with antibiotic therapy in acute phase but underlying dysphasia associated with advanced dementia is not reversible. Kathleen Walls shares he has not thought of these things in that context but appreciates the education. Encouraged Kathleen Walls to consider ongoing goals of care in regard to recurrent hospitalizations in the setting of recurrent infections when underlying cause cannot be reversed. Briefly introduced topic of comfort care/hospice, Kathleen Walls desires time to process this information and remains hopeful in a recovery for Kathleen Walls to be interactive with him.  I discussed importance of continued conversations with family/support persons and all members of their medical team regarding overall plan of care and treatment options ensuring decisions are in alignment with  patients goals of care.  Kathleen Walls  verbalizes understanding of conversations All questions/concerns addressed. Emotional support provided to patient/family/support persons. PMT will continue to follow and support patient as needed.  Objective: Primary Diagnoses: Present on Admission:  Upper GI bleed  Benign hypertension  Arteriosclerosis of coronary artery  HLD (hyperlipidemia)  CREST (calcinosis, Raynaud's phenomenon, esophageal dysfunction, sclerodactyly, telangiectasia) (HCC)  Dementia (HCC)  Depression with anxiety  Protein-calorie malnutrition, severe (HCC)  Acute metabolic encephalopathy  Aspiration pneumonia (HCC)  Severe sepsis (HCC)  Chronic diastolic CHF (congestive heart failure) (HCC)  Esophagitis   Physical Exam Constitutional:      General: She is not in acute distress.    Appearance: She is cachectic. She is ill-appearing.  Pulmonary:     Effort: Pulmonary effort is normal. No respiratory distress.  Skin:    General: Skin is warm and dry.     Vital Signs: BP 119/74 (BP Location: Left Arm)   Pulse (!) 109   Temp 97.6 F (36.4 C) (Oral)   Resp 17   Ht '5\' 3"'$  (1.6 m)   Wt 32.6 kg   SpO2 96%   BMI 12.73 kg/m  Pain Scale: PAINAD   Pain Score: Asleep  IO: Intake/output summary:  Intake/Output Summary (Last 24 hours) at 10/12/2022 1627 Last data filed at 10/12/2022 1600 Gross per 24 hour  Intake 1295.44 ml  Output 500 ml  Net 795.44 ml    LBM: Last BM Date : 10/11/22 Baseline Weight: Weight: 38.6 kg Most recent weight: Weight: 32.6 kg      Assessment and Plan  SUMMARY OF RECOMMENDATIONS   DNR Spouse desires time for outcomes, remains realistic in his understanding of dementia disease trajectory Ongoing GOC discussions needed PMT to continue to follow for ongoing needs and support  Discussed With: Primary team and nursing staff   Thank you for this consult and allowing Palliative Medicine to participate in the care of Kathleen Walls. Palliative medicine will continue to follow  and assist as needed.   Time Total: 55 minutes  Time spent includes: Detailed review of medical records (labs, imaging, vital signs), medically appropriate exam (mental status, respiratory, cardiac, skin), discussed with treatment team, counseling and educating patient, family and staff, documenting clinical information, medication management and coordination of care.   Signed by: Theodoro Grist, DNP, AGNP-C Palliative Medicine    Please contact Palliative Medicine Team phone at [phone] for questions and concerns.  For individual provider: See Shea Evans

## 2022-10-12 NOTE — TOC Progression Note (Signed)
Transition of Care [facility]) - Progression Note    Patient Details  Name: Kathleen Walls MRN: [id number] Date of Birth: [birth year]  Transition of Care [facility]) CM/SW Contact  Laurena Slimmer, RN Phone Number: 10/12/2022, 4:29 PM  Clinical Narrative:    Spoke with patient's husband at bedside.  She is from [facility] Admitted for: Sepsis PCP: [place]: Melissa Noon      Expected Discharge Plan: [place] Barriers to Discharge: Continued Medical Work up  Expected Discharge Plan and [place] arrangements for the past 2 months: [place] [facility])                                       Social Determinants of Health (SDOH) Interventions SDOH Screenings   Food Insecurity: No Food Insecurity (09/10/2022)  Housing: Low Risk  (09/10/2022)  Transportation Needs: Unknown (10/10/2022)  Utilities: Unknown (10/10/2022)  Tobacco Use: Medium Risk (10/10/2022)    Readmission Risk Interventions     No data to display

## 2022-10-12 NOTE — Plan of Care (Signed)
  Problem: Education: Goal: Verbalization of understanding the information provided (i.e., activity precautions, restrictions, etc) will improve Outcome: Progressing Goal: Individualized Educational Video(s) Outcome: Progressing   Problem: Activity: Goal: Ability to ambulate and perform ADLs will improve Outcome: Progressing   Problem: Clinical Measurements: Goal: Postoperative complications will be avoided or minimized Outcome: Progressing   Problem: Self-Concept: Goal: Ability to maintain and perform role responsibilities to the fullest extent possible will improve Outcome: Progressing   Problem: Pain Management: Goal: Pain level will decrease Outcome: Progressing   Problem: Activity: Goal: Ability to tolerate increased activity will improve Outcome: Progressing   Problem: Clinical Measurements: Goal: Ability to maintain a body temperature in the normal range will improve Outcome: Progressing   Problem: Respiratory: Goal: Ability to maintain adequate ventilation will improve Outcome: Progressing Goal: Ability to maintain a clear airway will improve Outcome: Progressing   Problem: Education: Goal: Knowledge of General Education information will improve Description: Including pain rating scale, medication(s)/side effects and non-pharmacologic comfort measures Outcome: Progressing   Problem: Health Behavior/Discharge Planning: Goal: Ability to manage health-related needs will improve Outcome: Progressing   Problem: Clinical Measurements: Goal: Ability to maintain clinical measurements within normal limits will improve Outcome: Progressing Goal: Will remain free from infection Outcome: Progressing Goal: Diagnostic test results will improve Outcome: Progressing Goal: Respiratory complications will improve Outcome: Progressing Goal: Cardiovascular complication will be avoided Outcome: Progressing   Problem: Activity: Goal: Risk for activity intolerance will  decrease Outcome: Progressing   Problem: Nutrition: Goal: Adequate nutrition will be maintained Outcome: Progressing   Problem: Coping: Goal: Level of anxiety will decrease Outcome: Progressing   Problem: Elimination: Goal: Will not experience complications related to bowel motility Outcome: Progressing Goal: Will not experience complications related to urinary retention Outcome: Progressing   Problem: Pain Managment: Goal: General experience of comfort will improve Outcome: Progressing   Problem: Safety: Goal: Ability to remain free from injury will improve Outcome: Progressing   Problem: Skin Integrity: Goal: Risk for impaired skin integrity will decrease Outcome: Progressing

## 2022-10-13 DIAGNOSIS — A419 Sepsis, unspecified organism: Secondary | ICD-10-CM | POA: Diagnosis not present

## 2022-10-13 DIAGNOSIS — Z7189 Other specified counseling: Secondary | ICD-10-CM | POA: Diagnosis not present

## 2022-10-13 DIAGNOSIS — E43 Unspecified severe protein-calorie malnutrition: Secondary | ICD-10-CM | POA: Diagnosis not present

## 2022-10-13 DIAGNOSIS — K209 Esophagitis, unspecified without bleeding: Secondary | ICD-10-CM | POA: Diagnosis not present

## 2022-10-13 DIAGNOSIS — F01518 Vascular dementia, unspecified severity, with other behavioral disturbance: Secondary | ICD-10-CM | POA: Diagnosis not present

## 2022-10-13 DIAGNOSIS — J690 Pneumonitis due to inhalation of food and vomit: Secondary | ICD-10-CM | POA: Diagnosis not present

## 2022-10-13 LAB — CBC
HCT: 40.9 % (ref 36.0–46.0)
Hemoglobin: 12.8 g/dL (ref 12.0–15.0)
MCH: 26.6 pg (ref 26.0–34.0)
MCHC: 31.3 g/dL (ref 30.0–36.0)
MCV: 84.9 fL (ref 80.0–100.0)
Platelets: 288 10*3/uL (ref 150–400)
RBC: 4.82 MIL/uL (ref 3.87–5.11)
RDW: 16 % — ABNORMAL HIGH (ref 11.5–15.5)
WBC: 9.7 10*3/uL (ref 4.0–10.5)
nRBC: 0 % (ref 0.0–0.2)

## 2022-10-13 LAB — BASIC METABOLIC PANEL
Anion gap: 7 (ref 5–15)
BUN: 9 mg/dL (ref 8–23)
CO2: 29 mmol/L (ref 22–32)
Calcium: 8.7 mg/dL — ABNORMAL LOW (ref 8.9–10.3)
Chloride: 106 mmol/L (ref 98–111)
Creatinine, Ser: 0.6 mg/dL (ref 0.44–1.00)
GFR, Estimated: >60 mL/min (ref >60)
Glucose, Bld: 111 mg/dL — ABNORMAL HIGH (ref 70–99)
Potassium: 4.2 mmol/L (ref 3.5–5.1)
Sodium: 142 mmol/L (ref 135–145)

## 2022-10-13 MED ORDER — AMOXICILLIN-POT CLAVULANATE 500-125 MG PO TABS: 1.0000 | ORAL_TABLET | Freq: Two times a day (BID) | ORAL | 0 refills | Status: DC

## 2022-10-13 MED ORDER — POLYETHYLENE GLYCOL 3350 17 G PO PACK: 17.0000 g | PACK | Freq: Every day | ORAL | 0 refills | Status: DC | PRN

## 2022-10-13 MED ORDER — PANTOPRAZOLE SODIUM 40 MG PO TBEC
40.0000 mg | DELAYED_RELEASE_TABLET | Freq: Every day | ORAL | Status: DC
  Administered 2022-10-13: 40 mg via ORAL
  Filled 2022-10-13: qty 1

## 2022-10-13 MED ORDER — AMOXICILLIN-POT CLAVULANATE 500-125 MG PO TABS: 1.0000 | ORAL_TABLET | Freq: Two times a day (BID) | ORAL | 0 refills | Status: AC

## 2022-10-13 MED ORDER — AMOXICILLIN-POT CLAVULANATE 500-125 MG PO TABS
1.0000 | ORAL_TABLET | Freq: Two times a day (BID) | ORAL | Status: DC
  Administered 2022-10-13: 1 via ORAL
  Filled 2022-10-13: qty 1

## 2022-10-13 NOTE — Progress Notes (Signed)
Daily Progress Note   Patient Name: Kathleen Walls       Date: 10/13/2022 DOB: 11-27-1940  Age: 82 y.o. MRN#: [id number] Attending Physician: [name], MD Primary Care Physician: Bonnita Nasuti, MD Admit Date: 10/09/2022  Reason for Consultation/Follow-up: Establishing goals of care  HPI/Brief Hospital Review: 82 y.o. female  with past medical history of HTN, HLD< CAD s/p stent placement, stroke/TIA, depression and anxiety, breast cancer, CREST syndrome and dementia admitted from [facility] on 10/09/2022 with hematemesis found to have severe sepsis secondary to aspiration pneumonia. Noted recent hospitalization in January for left hip fracture, underwent surgical repair.    Palliative medicine was consulted for assisting with goals of care conversations given overall poor prognosis long-term.  Subjective: Extensive chart review has been completed prior to meeting patient including labs, vital signs, imaging, progress notes, orders, and available advanced directive documents from current and previous encounters.    Visited with Kathleen Walls at her bedside. In bed, eyes remain closed, does not respond to the calling of her name. Husband-William at bedside during time of visit. Kathleen Walls shares plan for discharge back to [facility] later today.  We discussed results of therapy consult. Kathleen Walls shares Kathleen Walls unable to participate in therapy services as she is unable to follow simple commands. Shares therapy is not recommending ongoing therapy services at discharge. Kathleen Walls shares his disappointment but shares he is not surprised as he has been aware of her ongoing significant decline.  Kathleen Walls is thankful she is able to return to [facility]. We discussed ongoing goals of care, encouraged to connect with  social worker at [facility] for ongoing support. Shares he is not ready to pursue hospice services at this time but encouraged ongoing conversations with his family and support system.  Addressed all questions and concerns at this time.  Objective:  Physical Exam Constitutional:      General: She is not in acute distress.    Appearance: She is cachectic. She is ill-appearing.  Pulmonary:     Effort: Pulmonary effort is normal. No respiratory distress.  Skin:    General: Skin is warm and dry.  Neurological:     Motor: Weakness present.             Vital Signs: BP 109/68 (BP Location: Right Arm)  Pulse (!) 110   Temp 97.7 F (36.5 C)   Resp 18   Ht '5\' 3"'$  (1.6 m)   Wt 32.6 kg   SpO2 98%   BMI 12.73 kg/m  SpO2: SpO2: 98 % O2 Device: O2 Device: Room Air O2 Flow Rate: O2 Flow Rate (L/min): 2 L/min   Palliative Care Assessment & Plan   Assessment/Recommendation/Plan  DNR Planned d/c back to [facility] today Encouraged ongoing Guadalupe Guerra conversations with Kathleen Walls with his understanding of Kathleen Walls's advanced dementia with ongoing decline   Thank you for allowing the Palliative Medicine Team to assist in the care of this patient.  Total time:  25 minutes  Time spent includes: Detailed review of medical records (labs, vital signs), medically appropriate exam (mental status, respiratory, cardiac, skin), discussed with treatment team, counseling and educating patient, family and staff, documenting clinical information, medication management and coordination of care.  Theodoro Grist, DNP, AGNP-C Palliative Medicine   Please contact Palliative Medicine Team phone at [phone] for questions and concerns.

## 2022-10-13 NOTE — TOC Transition Note (Signed)
Transition of Care [facility]) - CM/SW Discharge Note   Patient Details  Name: Kathleen Walls MRN: [id number] Date of Birth: Dec 24, 1940  Transition [facility] Care [facility]) CM/SW Contact:  Laurena Slimmer, RN Phone Number: 10/13/2022, 10:07 AM   Clinical Narrative:    Attempt to reach Rachel Bo, Scientist, physiological for [employer] regarding discharge. Left a message.      Barriers to Discharge: Continued Medical Work up   Patient Goals and CMS Choice      Discharge Placement                         Discharge Plan and Services Additional resources added to the After Visit Summary for                                       Social Determinants of Health (SDOH) Interventions SDOH Screenings   Food Insecurity: No Food Insecurity (09/10/2022)  Housing: Low Risk  (09/10/2022)  Transportation Needs: Unknown (10/10/2022)  Utilities: Unknown (10/10/2022)  Tobacco Use: Medium Risk (10/10/2022)     Readmission Risk Interventions     No data to display

## 2022-10-13 NOTE — NC FL2 (Signed)
[place] LEVEL OF CARE FORM     IDENTIFICATION  Patient Name: Kathleen Walls Birthdate: [birth year] Sex: female Admission Date (Current Location): 10/09/2022  [facility] [place] Number:  Engineering geologist and Address:  [facility], [address] [place], [place] [address]      Provider Number: [id number]  Attending Physician Name and Address:  [name], MD  Relative Name and Phone Number:  Lucill, Abrahamsen (Spouse) [phone]    Current Level of Care: Hospital Recommended Level of Care: [place] Prior Approval Number:    Date Approved/Denied:   PASRR Number:    Discharge Plan: Other (Comment) ([place])    Current Diagnoses: Patient Active Problem List   Diagnosis Date Noted   Upper GI bleed 0000000   Acute metabolic encephalopathy 0000000   Aspiration pneumonia ([place]) 10/09/2022   Severe sepsis ([place]) 10/09/2022   Chronic diastolic CHF (congestive heart failure) ([place]) 10/09/2022   Esophagitis 10/09/2022   Closed left hip fracture, initial encounter ([place]) 09/10/2022   Depression with anxiety 09/10/2022   Elevated CK 09/10/2022   Leukocytosis 09/10/2022   Fall at home, initial encounter 09/10/2022   Protein-calorie malnutrition, severe ([place]) 09/10/2022   Normocytic anemia 09/10/2022   Dementia ([place]) 02/02/2022   Prediabetes 01/19/2022   Memory disorder 07/04/2021   Statin intolerance 12/03/2019   Bilateral carotid artery stenosis 07/29/2019   History of CVA (cerebrovascular accident) 07/29/2019   Pure hypercholesterolemia 04/04/2019   Calcinosis 03/27/2019   CREST (calcinosis, Raynaud's phenomenon, esophageal dysfunction, sclerodactyly, telangiectasia) ([place]) 03/27/2019   Encounter for long-term (current) use of high-risk medication 03/27/2019   Constipation, chronic 06/28/2018   Primary osteoarthritis of both hands 05/09/2018   Bilateral hand pain 01/25/2018   Raynaud's  phenomenon without gangrene 01/25/2018   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   Carotid stenosis 12/26/2017   TIA (transient ischemic attack) 11/16/2017   Acute cerebrovascular accident (CVA) ([place]) 11/16/2017   Carcinoma of overlapping sites of left breast in female, estrogen receptor positive ([place]) 11/17/2016   Symptomatic carotid artery stenosis without infarction 09/11/2016   H/O appendicitis 08/01/2016   Thyromegaly 12/22/2015   Chest pain 05/14/2015   Arteriosclerosis of coronary artery 07/22/2014   Disorder of mitral valve 01/17/2014   Absolute anemia 10/23/2013   Benign hypertension 10/23/2013   Dermatophytic onychia 10/23/2013   HLD (hyperlipidemia) 10/23/2013   Idiopathic peripheral neuropathy 10/23/2013    Orientation RESPIRATION BLADDER Height & Weight        Normal External catheter Weight: 32.6 kg Height:  '5\' 3"'$  (160 cm)  BEHAVIORAL SYMPTOMS/MOOD NEUROLOGICAL BOWEL NUTRITION STATUS  Other (Comment) (n/a)  (n/a) Incontinent Diet (DYS 1)  AMBULATORY STATUS COMMUNICATION OF NEEDS Skin   Total Care Verbally (Mumbles words) Normal                       Personal Care Assistance Level of Assistance  Bathing, Feeding, Dressing Bathing Assistance: Limited assistance Feeding assistance: Limited assistance Dressing Assistance: Limited assistance Total Care Assistance: Maximum assistance   Functional Limitations Info             SPECIAL CARE FACTORS FREQUENCY                       Contractures Contractures Info: Not present    Additional Factors Info  Code Status, Allergies Code Status Info: DNR Allergies Info: Alendronate Sodium, Atorvastatin, Azithromycin, Ciprofloxacin, Colestipol, Lidocaine, Lovastatin, Metronidazole, Niacin, Risedronate Sodium, Rosuvastatin, Simvastatin  Current Medications (10/13/2022):  This is the current hospital active medication list TAKE these medications     acetaminophen 325 MG tablet Commonly known as: TYLENOL Take  650 mg by mouth every 4 (four) hours as needed for mild pain.    alum & mag hydroxide-simeth 200-200-20 MG/5ML suspension Commonly known as: MAALOX/MYLANTA Take 30 mLs by mouth every 4 (four) hours as needed for indigestion or heartburn.    amLODipine 2.5 MG tablet Commonly known as: NORVASC Take 2.5 mg by mouth daily.    amoxicillin-clavulanate 500-125 MG tablet Commonly known as: AUGMENTIN Take 1 tablet by mouth every 12 (twelve) hours for 3 days.    ezetimibe 10 MG tablet Commonly known as: ZETIA Take 10 mg by mouth daily.    ferrous sulfate 325 (65 FE) MG tablet Take 1 tablet (325 mg total) by mouth daily with breakfast.    guaiFENesin 100 MG/5ML liquid Commonly known as: ROBITUSSIN Take 15 mLs by mouth every 6 (six) hours as needed for cough or to loosen phlegm.    HYDROcodone-acetaminophen 5-325 MG tablet Commonly known as: NORCO/VICODIN Take 1 tablet by mouth every 4 (four) hours as needed for moderate pain or severe pain (half a tab for moderate pain, 1 tab for severe pain.).    hydroxychloroquine 200 MG tablet Commonly known as: PLAQUENIL Take 200 mg by mouth daily.    loperamide 2 MG tablet Commonly known as: IMODIUM A-D Take 4 mg by mouth 4 (four) times daily as needed for diarrhea or loose stools.    metoprolol succinate 25 MG 24 hr tablet Commonly known as: TOPROL-XL Take 12.5 mg by mouth daily.    mirtazapine 15 MG tablet Commonly known as: REMERON Take 15 mg by mouth at bedtime.    multivitamin with minerals Tabs tablet Take 1 tablet by mouth daily.    OLANZapine 2.5 MG tablet Commonly known as: ZyPREXA Take 1 tablet (2.5 mg total) by mouth at bedtime.    omeprazole 20 MG capsule Commonly known as: PRILOSEC Take 20 mg by mouth 2 (two) times daily before a meal.    polyethylene glycol 17 g packet Commonly known as: MIRALAX / GLYCOLAX Take 17 g by mouth daily as needed for mild constipation.    Venlafaxine HCl 75 MG Tb24 Take 75 mg by mouth  daily.     Discharge Medications: Please see discharge summary for a list of discharge medications.  Relevant Imaging Results:  Relevant Lab Results:   Additional Information SS# [id number]  Laurena Slimmer, RN

## 2022-10-13 NOTE — Evaluation (Signed)
Occupational Therapy Evaluation Patient Details Name: Kathleen Walls MRN: [id number] DOB: 10-Nov-1940 Today's Date: 10/13/2022   History of Present Illness Pt is an 82 y/o F admitted on 10/09/22 after presenting with hematemesis & severe sepsis 2/2 possible aspiration PNA. Of note, pt with recent L IM nailing on 09/11/22. PMH: HTN, HLD, CAD s/p stent placement, stroke/TIA, depression, anxiety, breast CA, CREST syndrome, dementia   Clinical Impression   Upon entering the room, pt supine in bed with husband present in room. Pt with eyes closed and laying on her R side in bed. Pt needing total A for 2 for bed mobility and unable to follow simple commands. Pt does not open her eyes during session or actively engage with therapist at any point. Unable to get L foot onto floor and LE is internally rotated. Pt does moan and cry out with attempts to straighten/range L LE.  Pt does not appear to be  good candidate for therapy as she is unable to actively participate and needing total care secondary to baseline dementia. OT to complete orders at this time.      Recommendations for follow up therapy are one component of a multi-disciplinary discharge planning process, led by the attending physician.  Recommendations may be updated based on patient status, additional functional criteria and insurance authorization.   Follow Up Recommendations  Long-term institutional care without follow-up therapy     Assistance Recommended at Discharge Frequent or constant Supervision/Assistance        Equipment Recommendations  None recommended by OT       Precautions / Restrictions Precautions Precautions: Fall Restrictions Weight Bearing Restrictions: Yes LLE Weight Bearing: Weight bearing as tolerated      Mobility Bed Mobility Overal bed mobility: Needs Assistance Bed Mobility: Supine to Sit, Sit to Supine, Rolling Rolling: Total assist, +2 for physical assistance   Supine to sit: Total assist, +2 for  physical assistance Sit to supine: Total assist, +2 for physical assistance   General bed mobility comments: Pt requires dependent assist +2 for all bed mobility.    Transfers                          Balance Overall balance assessment: Needs assistance Sitting-balance support: Feet supported, Single extremity supported Sitting balance-Leahy Scale: Zero   Postural control: Right lateral lean   Standing balance-Leahy Scale: Zero                             ADL either performed or assessed with clinical judgement   ADL Overall ADL's : Needs assistance/impaired                                       General ADL Comments: total A for all self care at this time     Vision Patient Visual Report: No change from baseline              Pertinent Vitals/Pain Pain Assessment Pain Assessment: Faces Faces Pain Scale: Hurts whole lot Pain Location: LLE when therapist attempts to perform L knee extension PROM Pain Descriptors / Indicators: Grimacing, Discomfort, Crying, Moaning Pain Intervention(s): Monitored during session, Premedicated before session, Repositioned     Hand Dominance     Extremity/Trunk Assessment Upper Extremity Assessment Upper Extremity Assessment: Difficult to assess due to impaired cognition  Lower Extremity Assessment Lower Extremity Assessment: Difficult to assess due to impaired cognition   Cervical / Trunk Assessment Cervical / Trunk Assessment:  (Pt prefers to lie on R side in bed. Repositioned to L side at end of session.)   Communication     Cognition Arousal/Alertness: Lethargic   Overall Cognitive Status: History of cognitive impairments - at baseline                                 General Comments: Pt unable to follow commands during session and primarily keeps eyes closed throughout.                Home Living Family/patient expects to be discharged to:: Assisted living                              Home Equipment: None   Additional Comments: Per husband, resident of [facility]      Prior Functioning/Environment Prior Level of Function : Needs assist             Mobility Comments: Per husband, prior to fall in January pt was ambulatory without AD. Since the fall, pt has experienced functional decline. ~1 week ago, pt required max/total assist for pivot to w/c (spouse would assist) ADLs Comments: Staff assists with ADLs as needed; able to feed self once set up but now requiring assistance with all ADLs                OT Goals(Current goals can be found in the care plan section) Acute Rehab OT Goals Patient Stated Goal: to determine pt's needs OT Goal Formulation: With family Time For Goal Achievement: 10/13/22 Potential to Achieve Goals: Fair  OT Frequency:      Co-evaluation PT/OT/SLP Co-Evaluation/Treatment: Yes Reason for Co-Treatment: For patient/therapist safety;Necessary to address cognition/behavior during functional activity PT goals addressed during session: Mobility/safety with mobility;Balance OT goals addressed during session: ADL's and self-care      AM-PAC OT "6 Clicks" Daily Activity     Outcome Measure Help from another person eating meals?: Total Help from another person taking care of personal grooming?: Total Help from another person toileting, which includes using toliet, bedpan, or urinal?: Total Help from another person bathing (including washing, rinsing, drying)?: Total Help from another person to put on and taking off regular upper body clothing?: Total Help from another person to put on and taking off regular lower body clothing?: Total 6 Click Score: 6   End of Session    Activity Tolerance: Patient limited by pain;Other (comment) (limited by cognition) Patient left: in bed;with call bell/phone within reach;with bed alarm set;with family/visitor present                   Time: [id number] OT Time  Calculation (min): 25 min Charges:  OT General Charges $OT Visit: 1 Visit OT Evaluation $OT Eval Moderate Complexity: 1 [address] [place], OTR/L , CBIS ascom [phone]  10/13/22, 1:21 PM

## 2022-10-13 NOTE — Care Management Important Message (Signed)
Important Message  Patient Details  Name: Kathleen Walls MRN: [id number] Date of Birth: Jun 19, 1941   Medicare Important Message Given:  Yes     Juliann Pulse A Jama Krichbaum 10/13/2022, 1:12 PM

## 2022-10-13 NOTE — TOC Transition Note (Addendum)
Transition of Care [facility]) - CM/SW Discharge Note   Patient Details  Name: Kathleen Walls MRN: [id number] Date of Birth: [birth year]  [facility] CM/SW Contact:  Laurena Slimmer, RN Phone Number: 10/13/2022, 12:39 PM   Clinical Narrative:    Damaris Schooner with Rachel Bo from [place].  He can take the patient back today pending no major changes in her diet.  FL2 and discharge summary faxed to [phone] EMS requested Patient husband notified Nurse given number to call report   TOC signing off   Barriers to Discharge: Continued Medical Work up   Patient Goals and CMS Choice      Discharge Placement                         Discharge Plan and Services Additional resources added to the After Visit Summary for                                       Social Determinants of Health (SDOH) Interventions SDOH Screenings   Food Insecurity: No Food Insecurity (09/10/2022)  Housing: Low Risk  (09/10/2022)  Transportation Needs: Unknown (10/10/2022)  Utilities: Unknown (10/10/2022)  Tobacco Use: Medium Risk (10/10/2022)     Readmission Risk Interventions     No data to display

## 2022-10-13 NOTE — Evaluation (Signed)
Physical Therapy Evaluation Patient Details Name: Kathleen Walls MRN: [id number] DOB: 24-Jul-1941 Today's Date: 10/13/2022  History of Present Illness  Pt is an 82 y/o F admitted on 10/09/22 after presenting with hematemesis & severe sepsis 2/2 possible aspiration PNA. Of note, pt with recent L IM nailing on 09/11/22. PMH: HTN, HLD, CAD s/p stent placement, stroke/TIA, depression, anxiety, breast CA, CREST syndrome, dementia  Clinical Impression  Pt seen for PT evaluation with co-tx with OT for pt & therapists safety. Pt's spouse [name]) present for session, reporting prior to fall in January pt was ambulatory without AD, but since d/c from rehab pt required 1 person assist for stand pivot to w/c with caregiver providing majority of assistance. [name] also reports prior to fall pt would intermittently recall her or his name. On this date, pt only occasionally opens eyes but not on command/cuing, does not follow any simple commands & requires dependent assist for bed mobility & static sitting EOB. Therapist unable to fully extend L knee passively as pt does grimace/cry 2/2 pain with movement. Therapists provides dependent assist for peri hygiene 2/2 incontinent BM & void & repositioned pt in L sidelying to reduce pressure on R side. At this time, pt is not able to participate in skilled PT intervention. Educated Engineer, technical sales on recommendation of long term care. PT to complete current orders at this time.     Recommendations for follow up therapy are one component of a multi-disciplinary discharge planning process, led by the attending physician.  Recommendations may be updated based on patient status, additional functional criteria and insurance authorization.  Follow Up Recommendations Long-term institutional care without follow-up therapy Can patient physically be transported by private vehicle: No    Assistance Recommended at Discharge Frequent or constant Supervision/Assistance  Patient can return home with the  following  Two people to help with walking and/or transfers;Two people to help with bathing/dressing/bathroom;Help with stairs or ramp for entrance;Direct supervision/assist for medications management;Assistance with feeding;Assist for transportation;Assistance with cooking/housework;Direct supervision/assist for financial management    Equipment Recommendations Hospital bed  Recommendations for Other Services       Functional Status Assessment Patient has had a recent decline in their functional status and/or demonstrates limited ability to make significant improvements in function in a reasonable and predictable amount of time     Precautions / Restrictions Precautions Precautions: Fall Restrictions Weight Bearing Restrictions: Yes LLE Weight Bearing: Weight bearing as tolerated (per Dr. Sabra Heck via secure chat)      Mobility  Bed Mobility Overal bed mobility: Needs Assistance Bed Mobility: Supine to Sit, Sit to Supine, Rolling Rolling: Total assist, +2 for physical assistance   Supine to sit: Total assist, +2 for physical assistance Sit to supine: Total assist, +2 for physical assistance   General bed mobility comments: Pt requires dependent assist +2 for all bed mobility.    Transfers                        Ambulation/Gait                  Stairs            Wheelchair Mobility    Modified Rankin (Stroke Patients Only)       Balance Overall balance assessment: Needs assistance Sitting-balance support: Feet supported, Single extremity supported Sitting balance-Leahy Scale: Zero   Postural control: Right lateral lean  Pertinent Vitals/Pain Pain Assessment Pain Assessment: Faces Faces Pain Scale: Hurts whole lot Pain Location: LLE when therapist attempts to perform L knee extension PROM Pain Descriptors / Indicators: Grimacing, Discomfort, Crying Pain Intervention(s): Monitored during  session, Limited activity within patient's tolerance, Repositioned    Home Living Family/patient expects to be discharged to:: Assisted living                        Prior Function               Mobility Comments: Per husband, prior to fall in January pt was ambulatory without AD. Since the fall, pt has experienced functional decline. ~1 week ago, pt required max/total assist for pivot to w/c (spouse would assist)       Hand Dominance        Extremity/Trunk Assessment   Upper Extremity Assessment Upper Extremity Assessment: Generalized weakness;Difficult to assess due to impaired cognition (Pt with digits of BUE flexed, tight joints.)    Lower Extremity Assessment Lower Extremity Assessment: Difficult to assess due to impaired cognition;Generalized weakness (Pt maintains L hip internal rotation, BLE hip adduction, L knee flexed. Unable to fully extend L knee passively 2/2 tight joint.)    Cervical / Trunk Assessment Cervical / Trunk Assessment:  (Pt prefers to lie on R side in bed. Repositioned to L side at end of session.)  Communication      Cognition Arousal/Alertness: Lethargic   Overall Cognitive Status: History of cognitive impairments - at baseline                                 General Comments: Pt with hx of dementia with pt's spouse reporting prior to fall in January pt would intermittently recall her own name or recognize husband. On this date, pt only briefly opens eyes but of her own volition & not on command/cuing. Pt does not follow any simple, basic commands during session.        General Comments      Exercises     Assessment/Plan    PT Assessment Patient does not need any further PT services  PT Problem List         PT Treatment Interventions      PT Goals (Current goals can be found in the Care Plan section)  Acute Rehab PT Goals Patient Stated Goal: spouse hopeful pt can walk with walker again, but becoming more  aware this may not be possible PT Goal Formulation: With family Time For Goal Achievement: 10/27/22 Potential to Achieve Goals: Poor    Frequency       Co-evaluation PT/OT/SLP Co-Evaluation/Treatment: Yes Reason for Co-Treatment: For patient/therapist safety;Necessary to address cognition/behavior during functional activity PT goals addressed during session: Mobility/safety with mobility;Balance         AM-PAC PT "6 Clicks" Mobility  Outcome Measure Help needed turning from your back to your side while in a flat bed without using bedrails?: Total Help needed moving from lying on your back to sitting on the side of a flat bed without using bedrails?: Total Help needed moving to and from a bed to a chair (including a wheelchair)?: Total Help needed standing up from a chair using your arms (e.g., wheelchair or bedside chair)?: Total Help needed to walk in hospital room?: Total Help needed climbing 3-5 steps with a railing? : Total 6 Click Score: 6    End  of Session   Activity Tolerance: Patient limited by lethargy (limited 2/2 impaired cognition) Patient left: in bed;with call bell/phone within reach;with bed alarm set;with family/visitor present (4 rails up per husband)        Time: [id number] PT Time Calculation (min) (ACUTE ONLY): 20 min   Charges:   PT Evaluation $PT Eval Moderate Complexity: [place], PT, DPT 10/13/22, 12:53 PM   Waunita Schooner 10/13/2022, 12:50 PM

## 2022-10-13 NOTE — Progress Notes (Signed)
Speech Language Pathology Treatment: Dysphagia  Patient Details Name: Kathleen Walls MRN: [id number] DOB: Aug 20, 1940 Today's Date: 10/13/2022 Time: 0950-1030 SLP Time Calculation (min) (ACUTE ONLY): 40 min  Assessment / Plan / Recommendation Clinical Impression  Pt seen today for ongoing toleration of diet; education on aspiration precautions and supportive diet consistency for pt at this time. Husband present and helping to feed pt. Pt awake w/ mumbled, tangential speech. Severe/Profound Confusion; advanced Dementia at Baseline.  On RA, afebrile. WBC wnl.   Pt appears to present w/ oral prep/oral phase dysphagia heavily impacted by Significant Cognitive decline; no overt pharyngeal phase swallowing deficits during po intake but risk for such is present. Husband stated the Confusion is "worse right now" but noted that her Cognitive decline, Dementia has impacted her overall oral intake at the NH and during previous hospitalization last month(08/2022). She was recently upgraded to her baseline diet of soft solid foods and thin liquids -- she is usually able to hold the cup to feed self, per his report. ANY Cognitive decline can impact her overall awareness/timing of swallow and safety during po tasks which increases risk for aspiration, choking. Acute illness could have immediate impact on pt's Cognitive status also.   OF NOTE: pt has a baseline presentation of GERD/REFLUX and recent episode of hematemesis with coffee-ground material. CT Imaging of Chest revealed: "Lower Esophageal wall thickening suggests Esophagitis. Dilated,  fluid-filled mid and upper esophagus suggests dysmotility and/or  gastroesophageal reflux and likely predisposes patient to  aspiration".  ANY Dysmotility or Regurgitation of Reflux material can increase risk for aspiration of the Reflux material during Retrograde flow thus impact Voicing and Pulmonary status.     Pt consumed several trials of purees, minced solids, and thin  liquids via cup w/ No immediate, overt clinical s/s of aspiration noted: no decline in vocal quality; no immediate cough, and no decline in respiratory status during/post trials. Oral phase was adequate for bolus management and oral clearing of the liquid and puree boluses given. However, oral phase bolus management and mastication of the minced foods appeared challenging w/ much increased time and diffuse/scattered bolus residue in lateral/buccal areas of mouth. Alternating foods appeared to aid oral clearing and bolus management but MUCH TIME required for small, single bites to clear fully -- not effective appearing.  Pt required FULL feeding support this AM d/t the Cognitive decline.         In setting of baseline Dementia and Cognitive decline, acute illness/hospitalization, and Esophageal phase Dysmotility, Husband agreed w/ modifying the diet to Dysphagia level 1(PUREED foods moistened for ease of oral phase) w/ thin liquids via Cup; aspiration precautions; reduce Distractions during meals and engage pt during meals for self-feeding if able. Pills Crushed in Puree for safer swallowing as needed. Support w/ feeding and Supervision at all meals. MD/NSG updated.  ST services recommends follow w/ Palliative Care for [place] and education re: impact of Cognitive decline/Dementia on swallowing. Suspect pt is close to/at her baseline. Precautions posted in room. Education completed w/ Husband in room who agreed w/ POC and using the PUREED diet consistency for now for conservation of energy and improved oral intake.  Pt can have f/u at next venue of care for diet consistency upgrade when appropriate and acute illness has improved. Husband agreed. MD/NSG and TOC updated.       HPI HPI: Pt s a 82 y.o. female with medical history significant of Advanced Dementia, hypertension, hyperlipidemia, prediabetes, CAD with stent placement, stroke/TIA, GERD, dCHF, severe  protein calorie malnutrition, depression with anxiety,  kidney stone, breast cancer, diverticulitis, CREST syndrome, who presents with hematemesis and AMS.  Patient was recently hospitalized from 1/28 - 2/2 due to left hip fracture.  Patient finished rehab after surgery and went back to SNF on Friday. Per husband at bedside, pt was found to have hematemesis with coffee-ground material on morning of admit; no difficulty breathing nor facila droop.  Pt endorses stomach "pain" to Husband intermittently.   CT Angio of Chest includes: "posterior right upper lobe nodular consolidation could represent  aspiration or infection.  Lower Esophageal wall thickening suggests Esophagitis. Dilated,  fluid-filled mid and upper esophagus suggests dysmotility and/or  gastroesophageal reflux and likely predisposes patient to  aspiration".  Previous CXR last admit in 08/2022 was negative.      SLP Plan  All goals met      Recommendations for follow up therapy are one component of a multi-disciplinary discharge planning process, led by the attending physician.  Recommendations may be updated based on patient status, additional functional criteria and insurance authorization.    Recommendations  Diet recommendations: Dysphagia 1 (puree);Thin liquid Liquids provided via: Cup;No straw Medication Administration: Crushed with puree Supervision: Staff to assist with self feeding;Full supervision/cueing for compensatory strategies Compensations: Minimize environmental distractions;Slow rate;Small sips/bites;Lingual sweep for clearance of pocketing;Multiple dry swallows after each bite/sip;Follow solids with liquid Postural Changes and/or Swallow Maneuvers: Out of bed for meals;Seated upright 90 degrees;Upright 30-60 min after meal                General recommendations:  (Palliative Care; Dietician f/u) Oral Care Recommendations: Oral care QID;Oral care before and after PO;Staff/trained caregiver to provide oral care Follow Up Recommendations: No SLP follow up (appears at  declined functional status at this time) Assistance recommended at discharge: Frequent or constant Supervision/Assistance SLP Visit Diagnosis: Dysphagia, oropharyngeal phase (R13.12) (primary oral phase) Plan: All goals met             Orinda Kenner, MS, [place]; [place] [phone] (ascom) Makaylin Carlo  10/13/2022, 2:42 PM

## 2022-10-13 NOTE — Discharge Summary (Addendum)
Physician Discharge Summary   Patient: Kathleen Walls MRN: [id number] DOB: 12/29/1940  Admit date:     10/09/2022  Discharge date: 10/13/22  Discharge Physician: [name]   PCP: Bonnita Nasuti, MD   Recommendations at discharge:    F/u  PCP in 1 week at the facility  Discharge Diagnoses: Principal Problem:   Aspiration pneumonia [facility]) Active Problems:   Severe sepsis ([place])   Upper GI bleed   Esophagitis   Benign hypertension   Chronic diastolic CHF (congestive heart failure) (HCC)   Arteriosclerosis of coronary artery   Acute metabolic encephalopathy   HLD (hyperlipidemia)   CREST (calcinosis, Raynaud's phenomenon, esophageal dysfunction, sclerodactyly, telangiectasia) (HCC)   History of CVA (cerebrovascular accident)   Dementia ([place])   Depression with anxiety   Protein-calorie malnutrition, severe (HCC)  Kathleen Walls is a 82 y.o. female with medical history significant of dementia, hypertension, hyperlipidemia, prediabetes, CAD with stent placement, stroke/TIA, GERD, dCHF, severe protein calorie malnutrition, depression with anxiety, kidney stone, breast cancer, diverticulitis, CREST syndrome, who presents with hematemesis and AMS.   Patient was recently hospitalized from 1/28 - 2/2 due to left hip fracture.  Patient finished rehab after surgery and went back to [facility] (long term) on Friday. Per husband at bedside, pt was found to have hematemesis with coffee-ground material since yday   CTA: 1. Multifactorial degradation, as detailed above. No pulmonary embolism to the large segmental level. 2. Posterior right upper lobe nodular consolidation could represent aspiration or infection. 3. Lower esophageal wall thickening suggests esophagitis. Dilated, fluid-filled mid and upper esophagus suggests dysmotility and/or gastroesophageal reflux and likely predisposes patient to aspiration. 4. Hepatic steatosis 5. Similar right sided pericardial cyst. 6. Interval T11  compression deformity, favored to be nonacute. This could be correlated with point tenderness. 7. Coronary artery atherosclerosis. Aortic Atherosclerosis (ICD10-I70.0).    CT-abd/pelvis: Wall thickening of visualized distal esophagus is noted suggesting esophagitis. Endoscopy is recommended for further evaluation.  Gallbladder distention without evidence of cholecystitis or inflammation. Large amount of stool seen in the rectum concerning for impaction.   Severe sepsis due to aspiration pneumonia: CTA showed right upper lobe infiltration. Pt may have aspiration pneumonia versus HCAP.  Patient has severe sepsis with WBC 25.0, heart rate 121, lactic acid 4.5-->1.1 - IV Vancomycin and Zosyn --d/c vanc--change to po augmentin - Mucinex for cough  - Bronchodilators prn - Follow up blood culture x2 negative -afebrile and labs stable   Hematemesis due to esophagitis: Hgb 13.7 - Start IV pantoprazole 40 mg bid - Zofran IV for nausea - Avoid NSAIDs and SQ heparin -  13.7-- 10.3--6.4--1unit BT--11.5--12.8 --GI consult with Dr. Vicente Males. Given aspiration pneumonia hold off on invasive procedure unless patient has evidence of GI bleed --cont PPI   Benign hypertension: Blood pressure 125/72 BP ok resume low dose BB and amlodipine.   Chronic diastolic CHF (congestive heart failure) ([place]): 2D echo on 11/17/2017 showed EF 60-65% with grade 1 diastolic dysfunction.  Patient does not have leg edema JVD but CHF seem to be compensated. -Ch BNP--82   Arteriosclerosis of coronary artery -continue Zetia   Acute metabolic encephalopathy: CT head negative.  Pt has baseline severe Dementia   HLD (hyperlipidemia) -Zetia   CREST (calcinosis, Raynaud's phenomenon, esophageal dysfunction, sclerodactyly, telangiectasia):  -resume meds   History of CVA (cerebrovascular accident) - aspirin -Zetia   Dementia ([place]) -Continue home olanzapine -As needed Haldol for agitation   Depression with  anxiety -Continue home medication   Protein-calorie  malnutrition, severe ([place]): Body weight 34.5 kg, BMI 13.46 -Nutrition consult    pt has been seen by [facility] in January 2024--she will benefit from f/u as out pt given overall poor long term prognosis  D/w husband. He understands overall pt's condition   DVT ppx: SCD   Code Status: DNR per her husband.   Family Communication:  Yes, patient's husband at bed side.             Consultants:GIDisposition: Long term care facility Diet recommendation:  Cardiac and Carb modified diet DISCHARGE MEDICATION: Allergies as of 10/13/2022       Reactions   Alendronate Sodium Other (See Comments)   Unknown   Atorvastatin Other (See Comments)   Muscle Pain   Azithromycin Hives   Ciprofloxacin Other (See Comments)   Headache   Colestipol Other (See Comments)   Unknown   Lidocaine Hives   Lovastatin Other (See Comments)   Headache   Metronidazole Other (See Comments)   Headache   Niacin Other (See Comments)   Unknown   Risedronate Sodium Other (See Comments)   Unknown   Rosuvastatin Other (See Comments)   Unknown   Simvastatin Other (See Comments)   Headache        Medication List     STOP taking these medications    aspirin EC 81 MG tablet   bisacodyl 10 MG suppository Commonly known as: DULCOLAX   haloperidol 0.5 MG tablet Commonly known as: HALDOL   memantine 5 MG tablet Commonly known as: NAMENDA   senna 8.6 MG Tabs tablet Commonly known as: SENOKOT       TAKE these medications    acetaminophen 325 MG tablet Commonly known as: TYLENOL Take 650 mg by mouth every 4 (four) hours as needed for mild pain.   alum & mag hydroxide-simeth 200-200-20 MG/5ML suspension Commonly known as: MAALOX/MYLANTA Take 30 mLs by mouth every 4 (four) hours as needed for indigestion or heartburn.   amLODipine 2.5 MG tablet Commonly known as: NORVASC Take 2.5 mg by mouth daily.   amoxicillin-clavulanate 500-125  MG tablet Commonly known as: AUGMENTIN Take 1 tablet by mouth every 12 (twelve) hours for 3 days.   ezetimibe 10 MG tablet Commonly known as: ZETIA Take 10 mg by mouth daily.   ferrous sulfate 325 (65 FE) MG tablet Take 1 tablet (325 mg total) by mouth daily with breakfast.   guaiFENesin 100 MG/5ML liquid Commonly known as: ROBITUSSIN Take 15 mLs by mouth every 6 (six) hours as needed for cough or to loosen phlegm.   HYDROcodone-acetaminophen 5-325 MG tablet Commonly known as: NORCO/VICODIN Take 1 tablet by mouth every 4 (four) hours as needed for moderate pain or severe pain (half a tab for moderate pain, 1 tab for severe pain.).   hydroxychloroquine 200 MG tablet Commonly known as: PLAQUENIL Take 200 mg by mouth daily.   loperamide 2 MG tablet Commonly known as: IMODIUM A-D Take 4 mg by mouth 4 (four) times daily as needed for diarrhea or loose stools.   metoprolol succinate 25 MG 24 hr tablet Commonly known as: TOPROL-XL Take 12.5 mg by mouth daily.   mirtazapine 15 MG tablet Commonly known as: REMERON Take 15 mg by mouth at bedtime.   multivitamin with minerals Tabs tablet Take 1 tablet by mouth daily.   OLANZapine 2.5 MG tablet Commonly known as: ZyPREXA Take 1 tablet (2.5 mg total) by mouth at bedtime.   omeprazole 20 MG capsule Commonly known as: PRILOSEC Take 20  mg by mouth 2 (two) times daily before a meal.   polyethylene glycol 17 g packet Commonly known as: MIRALAX / GLYCOLAX Take 17 g by mouth daily as needed for mild constipation.   Venlafaxine HCl 75 MG Tb24 Take 75 mg by mouth daily.        Follow-up Information     Hague, Rosalyn Charters, MD. Schedule an appointment as soon as possible for a visit in 1 week(s).   Specialty: Internal Medicine Why: hosptial f/u: Please call office for appointment. Contact information: [address] [place] [address] [phone]                 Filed Weights   10/09/22 [phone] 10/11/22 0922  10/12/22 1059  Weight: 34.5 kg 32.8 kg 32.6 kg     Condition at discharge: fair  The results of significant diagnostics from this hospitalization (including imaging, microbiology, ancillary and laboratory) are listed below for reference.   Imaging Studies: DG Chest Port 1 View  Result Date: 10/09/2022 CLINICAL DATA:  Possible sepsis. EXAM: PORTABLE CHEST 1 VIEW COMPARISON:  09/10/2022 FINDINGS: Lungs are hyperexpanded. New airspace disease noted parahilar right lung, suspicious for pneumonia, better evaluated on recent chest CTA. Focal opacity at the right cardio phrenic angle is compatible with the probable pericardial cyst seen on recent CT imaging. The cardiopericardial silhouette is within normal limits for size. The visualized bony structures of the thorax are unremarkable. Telemetry leads overlie the chest. IMPRESSION: New airspace disease in the parahilar right lung, suspicious for pneumonia, better evaluated on recent chest CTA. Electronically Signed   By: Misty Stanley M.D.   On: 10/09/2022 08:59   CT Angio Chest PE W and/or Wo Contrast  Result Date: 10/09/2022 CLINICAL DATA:  Hematemesis for 3 days. EXAM: CT ANGIOGRAPHY CHEST WITH CONTRAST TECHNIQUE: Multidetector CT imaging of the chest was performed using the standard protocol during bolus administration of intravenous contrast. Multiplanar CT image reconstructions and MIPs were obtained to evaluate the vascular anatomy. RADIATION DOSE REDUCTION: This exam was performed according to the departmental dose-optimization program which includes automated exposure control, adjustment of the mA and/or kV according to patient size and/or use of iterative reconstruction technique. CONTRAST:  62m OMNIPAQUE IOHEXOL 350 MG/ML SOLN COMPARISON:  09/10/2022 chest radiograph.  Chest CT of 07/21/2015. FINDINGS: Cardiovascular: The quality of this exam for evaluation of pulmonary embolism is moderate. Limitations include patient arm position, not raised  above the head and mildly suboptimal bolus timing, centered in the aorta. No central lobar, or large segmental pulmonary embolism identified. Aortic atherosclerosis. Tortuous thoracic aorta. Mild cardiomegaly, without pericardial effusion. Three vessel coronary artery calcification. Mediastinum/Nodes: No mediastinal or hilar adenopathy. The distal esophagus is mildly dilated and thick walled, including on 107/4. Fluid-filled more superiorly, including on 66/4. Cystic lesion within the right cardiophrenic angle measures 4.3 x 2.8 cm on 111/4 and is relatively similar to on the prior. Lungs/Pleura: No pleural fluid. Mild biapical pleuroparenchymal scarring. Posterior right upper lobe nodular airspace disease including on 48 and 52 of series 6. Clustered right lower lobe tree-in-bud nodules are similar, including on 81/6. A pleural-based right lower lobe 5 mm nodule on 72/6 is unchanged and considered benign. 4-5 mm left lower lobe subpleural pulmonary nodule on 84/6 is also unchanged and considered benign. Upper Abdomen: Hepatic steatosis. Normal imaged portions of the spleen, pancreas, gallbladder, right adrenal gland. Left adrenal thickening. Bilateral renal sinus cysts. Interpolar left renal too small to characterize lesion is most likely a cyst at  4 mm . In the absence of clinically indicated signs/symptoms require(s) no independent follow-up. Musculoskeletal: Nonspecific left breast calcifications of up to 9 mm. T11 moderate compression deformity is new since the prior with minimal ventral canal encroachment. Review of the MIP images confirms the above findings. IMPRESSION: 1. Multifactorial degradation, as detailed above. No pulmonary embolism to the large segmental level. 2. Posterior right upper lobe nodular consolidation could represent aspiration or infection. 3. Lower esophageal wall thickening suggests esophagitis. Dilated, fluid-filled mid and upper esophagus suggests dysmotility and/or gastroesophageal  reflux and likely predisposes patient to aspiration. 4. Hepatic steatosis 5. Similar right sided pericardial cyst. 6. Interval T11 compression deformity, favored to be nonacute. This could be correlated with point tenderness. 7. Coronary artery atherosclerosis. Aortic Atherosclerosis (ICD10-I70.0). Electronically Signed   By: Abigail Miyamoto M.D.   On: 10/09/2022 08:51   CT ABDOMEN PELVIS W CONTRAST  Result Date: 10/09/2022 CLINICAL DATA:  Hematemesis. EXAM: CT ABDOMEN AND PELVIS WITH CONTRAST TECHNIQUE: Multidetector CT imaging of the abdomen and pelvis was performed using the standard protocol following bolus administration of intravenous contrast. RADIATION DOSE REDUCTION: This exam was performed according to the departmental dose-optimization program which includes automated exposure control, adjustment of the mA and/or kV according to patient size and/or use of iterative reconstruction technique. CONTRAST:  73m OMNIPAQUE IOHEXOL 350 MG/ML SOLN COMPARISON:  August 21, 2016. FINDINGS: Lower chest: Visualized lung bases are unremarkable. Wall thickening of distal esophagus is noted. Hepatobiliary: Gallbladder distention is noted without cholelithiasis. No biliary dilatation is noted. The liver is unremarkable. Pancreas: Unremarkable. No pancreatic ductal dilatation or surrounding inflammatory changes. Spleen: Normal in size without focal abnormality. Adrenals/Urinary Tract: Adrenal glands are unremarkable. Kidneys are normal, without renal calculi, focal lesion, or hydronephrosis. Bladder is unremarkable. Stomach/Bowel: Stomach is unremarkable. There is no evidence of bowel obstruction or inflammation. Large amount of stool seen in the rectum concerning for impaction. Vascular/Lymphatic: Aortic atherosclerosis. No enlarged abdominal or pelvic lymph nodes. Reproductive: Status post hysterectomy. No adnexal masses. Other: No abdominal wall hernia or abnormality. No abdominopelvic ascites. Musculoskeletal: Status  post surgical internal fixation of proximal left femoral fracture. No acute osseous abnormality is noted. Old T11 fracture is noted. IMPRESSION: Wall thickening of visualized distal esophagus is noted suggesting esophagitis. Endoscopy is recommended for further evaluation. Gallbladder distention without evidence of cholecystitis or inflammation. Large amount of stool seen in the rectum concerning for impaction. Aortic Atherosclerosis (ICD10-I70.0). Electronically Signed   By: JMarijo ConceptionM.D.   On: 10/09/2022 08:42   CT Head Wo Contrast  Result Date: 10/09/2022 CLINICAL DATA:  Mental status change.  Hematemesis for 3 days. EXAM: CT HEAD WITHOUT CONTRAST TECHNIQUE: Contiguous axial images were obtained from the base of the skull through the vertex without intravenous contrast. RADIATION DOSE REDUCTION: This exam was performed according to the departmental dose-optimization program which includes automated exposure control, adjustment of the mA and/or kV according to patient size and/or use of iterative reconstruction technique. COMPARISON:  09/10/2022 FINDINGS: Brain: No evidence of acute infarction, hemorrhage, hydrocephalus, extra-axial collection or mass lesion/mass effect. Remote right frontal convexity infarct is unchanged compared with the previous exam, image 22/3. Vascular: No hyperdense vessel or unexpected calcification. Skull: Normal. Negative for fracture or focal lesion. Sinuses/Orbits: The paranasal sinuses and mastoid air cells are clear Other: None. IMPRESSION: 1. No acute intracranial abnormalities. 2. Remote right frontal convexity infarct. Electronically Signed   By: TKerby MoorsM.D.   On: 10/09/2022 08:41    Microbiology: Results for orders  placed or performed during the hospital encounter of 10/09/22  Blood Culture (routine x 2)     Status: None (Preliminary result)   Collection Time: 10/09/22  7:23 AM   Specimen: BLOOD  Result Value Ref Range Status   Specimen Description  BLOOD BLOOD LEFT FOREARM  Final   Special Requests   Final    BOTTLES DRAWN AEROBIC AND ANAEROBIC Blood Culture adequate volume   Culture   Final    NO GROWTH 4 DAYS Performed at [facility], [address], [place], [place] [address]    Report Status PENDING  Incomplete  Blood Culture (routine x 2)     Status: None (Preliminary result)   Collection Time: 10/09/22  7:23 AM   Specimen: BLOOD  Result Value Ref Range Status   Specimen Description BLOOD RIGHT ANTECUBITAL  Final   Special Requests   Final    BOTTLES DRAWN AEROBIC AND ANAEROBIC Blood Culture adequate volume   Culture   Final    NO GROWTH 4 DAYS Performed at [facility], [address] [place], [place] [address]    Report Status PENDING  Incomplete  Resp panel by RT-PCR (RSV, Flu A&B, Covid) Anterior Nasal Swab     Status: None   Collection Time: 10/09/22 10:28 AM   Specimen: Anterior Nasal Swab  Result Value Ref Range Status   SARS Coronavirus 2 by RT PCR NEGATIVE NEGATIVE Final    Comment: (NOTE) SARS-CoV-2 target nucleic acids are NOT DETECTED.  The SARS-CoV-2 RNA is generally detectable in upper respiratory specimens during the acute phase of infection. The lowest concentration of SARS-CoV-2 viral copies this assay can detect is 138 copies/mL. A negative result does not preclude SARS-Cov-2 infection and should not be used as the sole basis for treatment or other patient management decisions. A negative result may occur with  improper specimen collection/handling, submission of specimen other than nasopharyngeal swab, presence of viral mutation(s) within the areas targeted by this assay, and inadequate number of viral copies(<138 copies/mL). A negative result must be combined with clinical observations, patient history, and epidemiological information. The expected result is Negative.  Fact Sheet for Patients:  EntrepreneurPulse.com.au  Fact Sheet for Healthcare  Providers:  IncredibleEmployment.be  This test is no t yet approved or cleared by the [place] FDA and  has been authorized for detection and/or diagnosis of SARS-CoV-2 by FDA under an Emergency Use Authorization (EUA). This EUA will remain  in effect (meaning this test can be used) for the duration of the COVID-19 declaration under Section 564(b)(1) of the Act, 21 U.S.C.section 360bbb-3(b)(1), unless the authorization is terminated  or revoked sooner.       Influenza A by PCR NEGATIVE NEGATIVE Final   Influenza B by PCR NEGATIVE NEGATIVE Final    Comment: (NOTE) The Xpert Xpress SARS-CoV-2/FLU/RSV plus assay is intended as an aid in the diagnosis of influenza from Nasopharyngeal swab specimens and should not be used as a sole basis for treatment. Nasal washings and aspirates are unacceptable for Xpert Xpress SARS-CoV-2/FLU/RSV testing.  Fact Sheet for Patients: EntrepreneurPulse.com.au  Fact Sheet for Healthcare Providers: IncredibleEmployment.be  This test is not yet approved or cleared by the [place] FDA and has been authorized for detection and/or diagnosis of SARS-CoV-2 by FDA under an Emergency Use Authorization (EUA). This EUA will remain in effect (meaning this test can be used) for the duration of the COVID-19 declaration under Section 564(b)(1) of the Act, 21 U.S.C. section 360bbb-3(b)(1), unless the authorization  is terminated or revoked.     Resp Syncytial Virus by PCR NEGATIVE NEGATIVE Final    Comment: (NOTE) Fact Sheet for Patients: EntrepreneurPulse.com.au  Fact Sheet for Healthcare Providers: IncredibleEmployment.be  This test is not yet approved or cleared by the [place] FDA and has been authorized for detection and/or diagnosis of SARS-CoV-2 by FDA under an Emergency Use Authorization (EUA). This EUA will remain in effect (meaning this test can be  used) for the duration of the COVID-19 declaration under Section 564(b)(1) of the Act, 21 U.S.C. section 360bbb-3(b)(1), unless the authorization is terminated or revoked.  Performed at [facility], [address] [place], [place] [address]   MRSA Next Gen by PCR, Nasal     Status: Abnormal   Collection Time: 10/09/22 12:04 PM   Specimen: Nasal Mucosa; Nasal Swab  Result Value Ref Range Status   MRSA by PCR Next Gen DETECTED (A) NOT DETECTED Final    Comment: RESULT CALLED TO, READ BACK BY AND VERIFIED WITH: ELAINA brockman 10/09/22 1416 mw (NOTE) The GeneXpert MRSA Assay (FDA approved for NASAL specimens only), is one component of a comprehensive MRSA colonization surveillance program. It is not intended to diagnose MRSA infection nor to guide or monitor treatment for MRSA infections. Test performance is not FDA approved in patients less than 81 years old. Performed at [facility], [address] [place], [place] [address]     Labs: CBC: Recent Labs  Lab 10/09/22 [phone] 10/09/22 1513 10/10/22 1635 10/11/22 0404 10/11/22 1705 10/12/22 0547 10/13/22 0547  WBC 25.0*   < > 13.2* 10.6* 11.3* 9.9 9.7  NEUTROABS 23.0*  --   --   --   --   --   --   HGB 13.7   < > 12.0 11.3* 12.5 13.0 12.8  HCT 44.9   < > 37.8 35.6* 39.0 40.1 40.9  MCV 84.7   < > 83.8 83.4 81.9 81.8 84.9  PLT 473*   < > 276 265 292 288 288   < > = values in this interval not displayed.   Basic Metabolic Panel: Recent Labs  Lab 10/09/22 0723 10/10/22 0800 10/11/22 0404 10/12/22 0542 10/12/22 0547 10/12/22 1652 10/13/22 0547  NA 138 141 140  --  140  --  142  K 3.7 3.0* 3.1*  --  2.9* 4.7 4.2  CL 95* 103 103  --  102  --  106  CO2 '28 28 23  '$ --  30  --  29  GLUCOSE 219* 93 56*  --  140*  --  111*  BUN 37* 17 14  --  7*  --  9  CREATININE 0.90 0.51 0.58  --  0.54  --  0.60  CALCIUM 9.5 8.5* 8.6*  --  8.5*  --  8.7*  MG  --   --  1.8 1.7  --   --   --    Liver Function  Tests: Recent Labs  Lab 10/09/22 0723  AST 35  ALT 24  ALKPHOS 210*  BILITOT 0.5  PROT 7.1  ALBUMIN 3.3*   CBG: Recent Labs  Lab 10/11/22 0950 10/11/22 1132 10/11/22 1615 10/12/22 0846 10/12/22 1601  GLUCAP 180* 181* 124* 99 135*    Discharge time spent: greater than 30 minutes.  Signed: [name], MD [facility] 10/13/2022

## 2022-10-14 LAB — CULTURE, BLOOD (ROUTINE X 2)
Culture: NO GROWTH
Culture: NO GROWTH
Special Requests: ADEQUATE
Special Requests: ADEQUATE

## 2022-10-24 ENCOUNTER — Telehealth: Payer: Self-pay | Admitting: *Deleted

## 2022-10-24 NOTE — Telephone Encounter (Signed)
Husband came by CC to cancel all future appts; She is in a facility in her last days of dementia.

## 2022-11-13 DEATH — deceased

## 2022-11-21 ENCOUNTER — Other Ambulatory Visit: Payer: Medicare Other

## 2022-11-21 ENCOUNTER — Ambulatory Visit: Payer: Medicare Other | Admitting: Internal Medicine

## 2024-02-29 IMAGING — MG MM DIGITAL SCREENING BILAT W/ TOMO AND CAD
6 of 10 series · 6 of 30 positions shown · non-contrast
Comparison: Previous exam(s).

CLINICAL DATA: Screening.

EXAM:
DIGITAL SCREENING BILATERAL MAMMOGRAM WITH TOMOSYNTHESIS AND CAD
TECHNIQUE: Bilateral screening digital craniocaudal and mediolateral oblique
mammograms were obtained. Bilateral screening digital breast
tomosynthesis was performed. The images were evaluated with
computer-aided detection.

[L XCCL synth-2D]
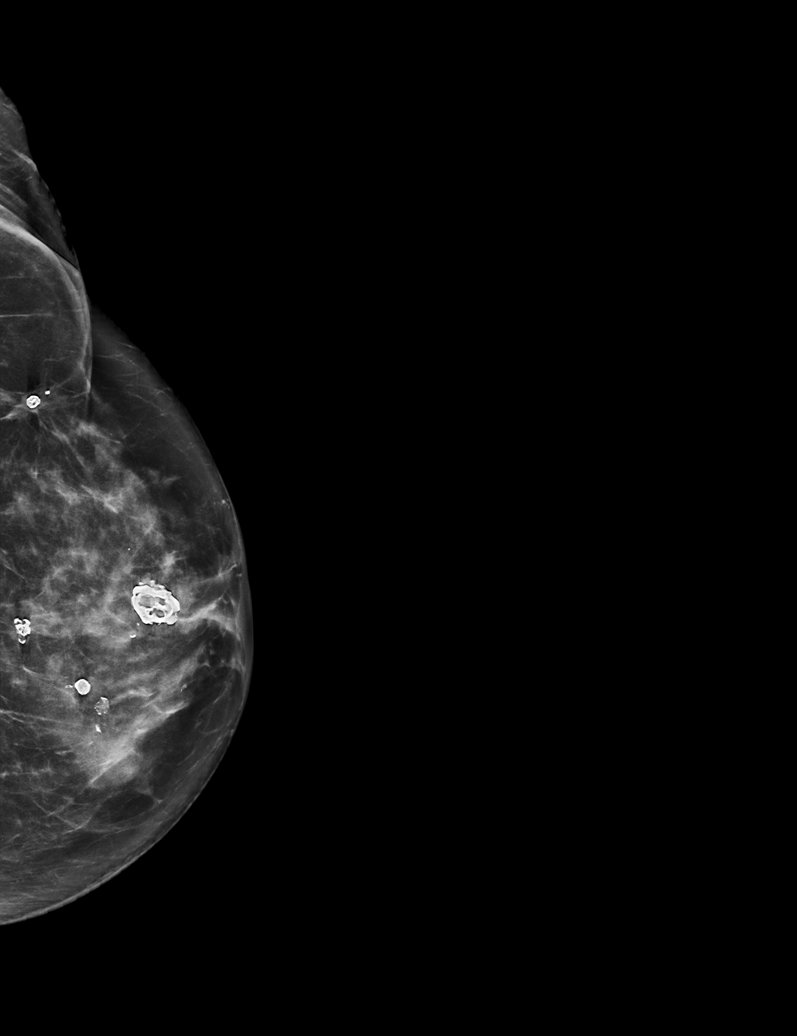

[L CC synth-2D]
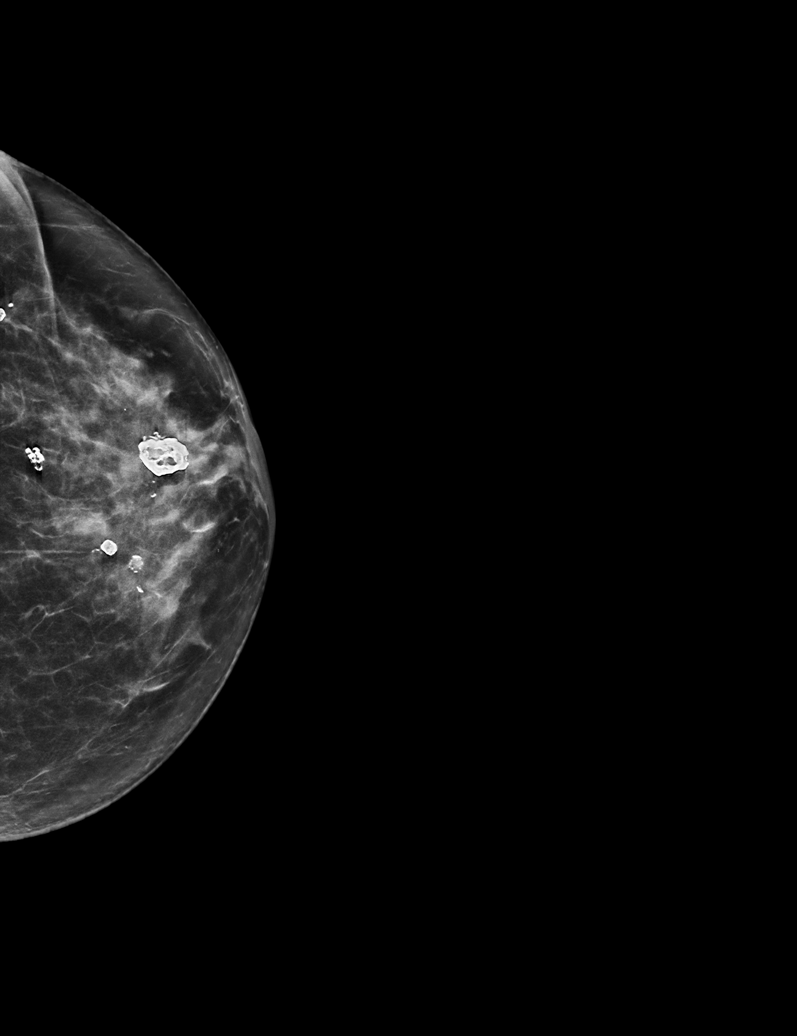

[R CC synth-2D]
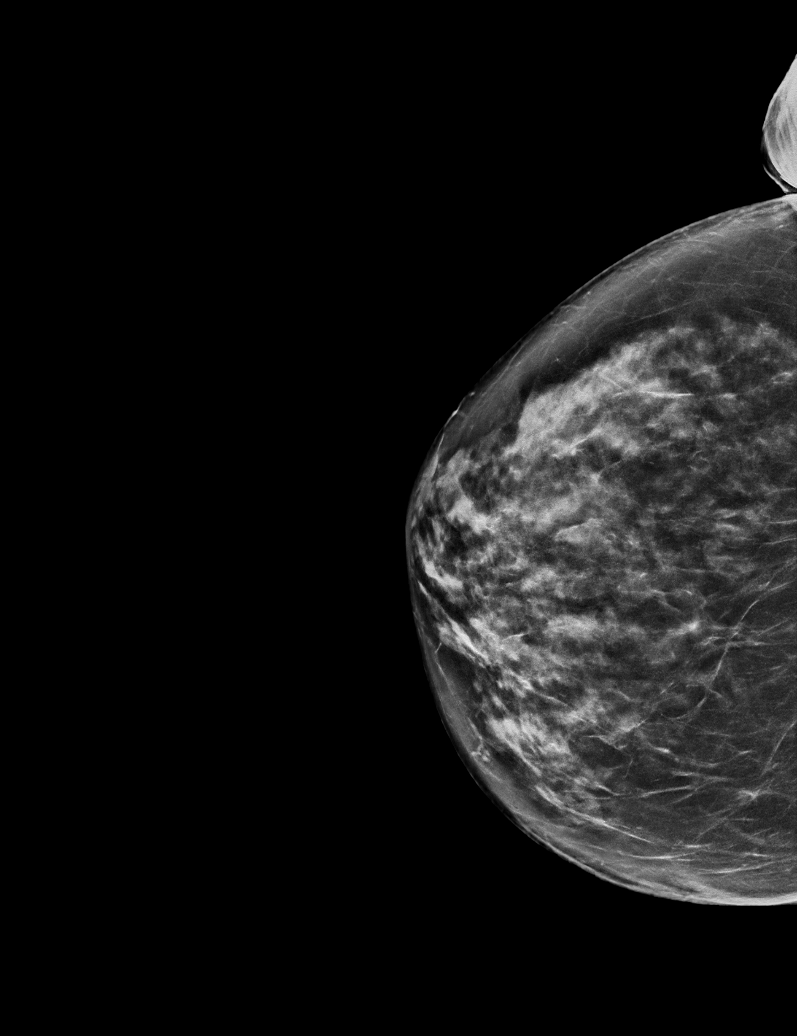

[L MLO synth-2D]
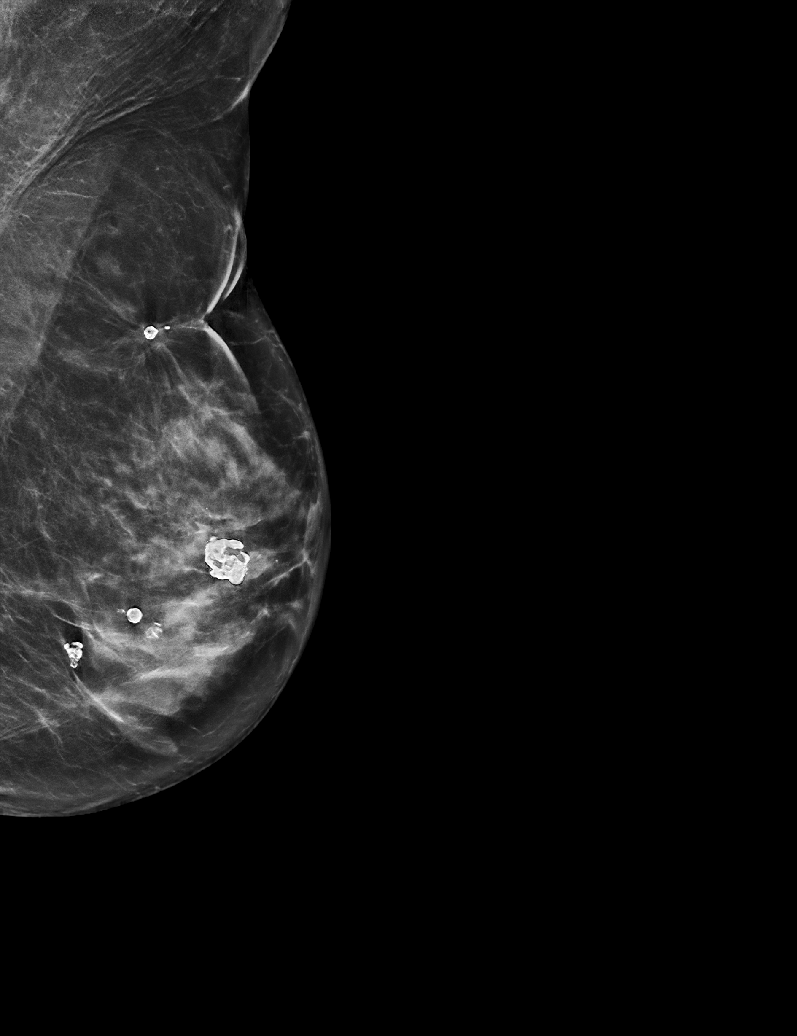

[R MLO synth-2D]
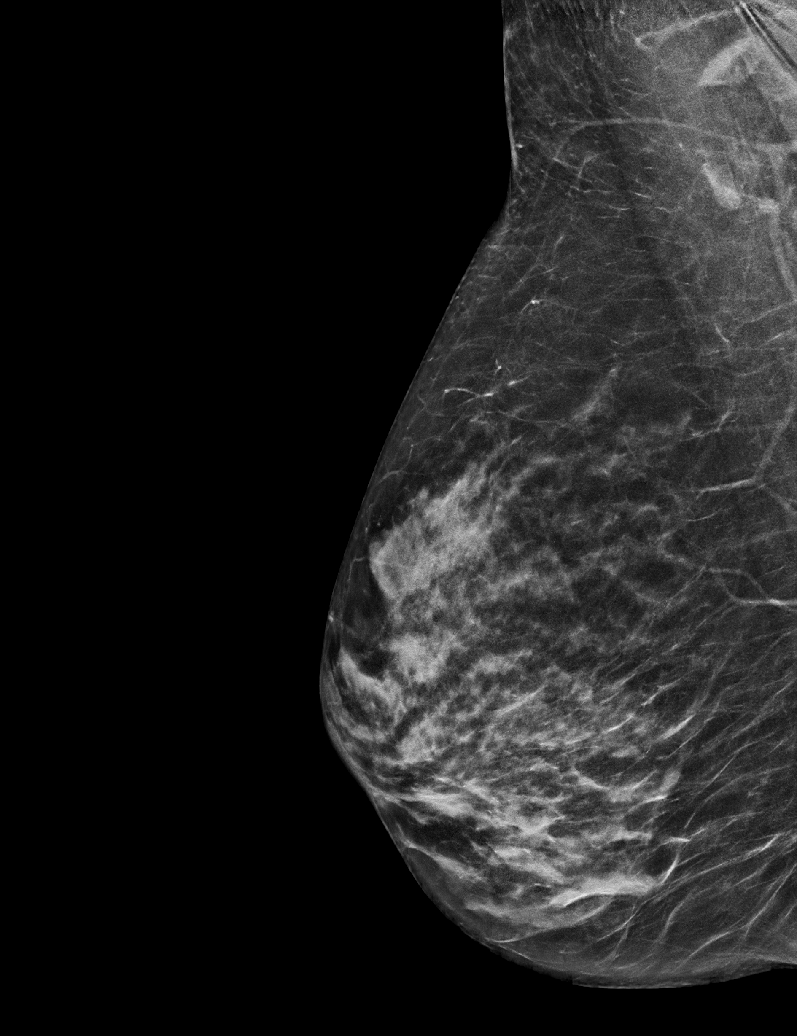

[L MLO tomo · tomo slice 33/66.0]
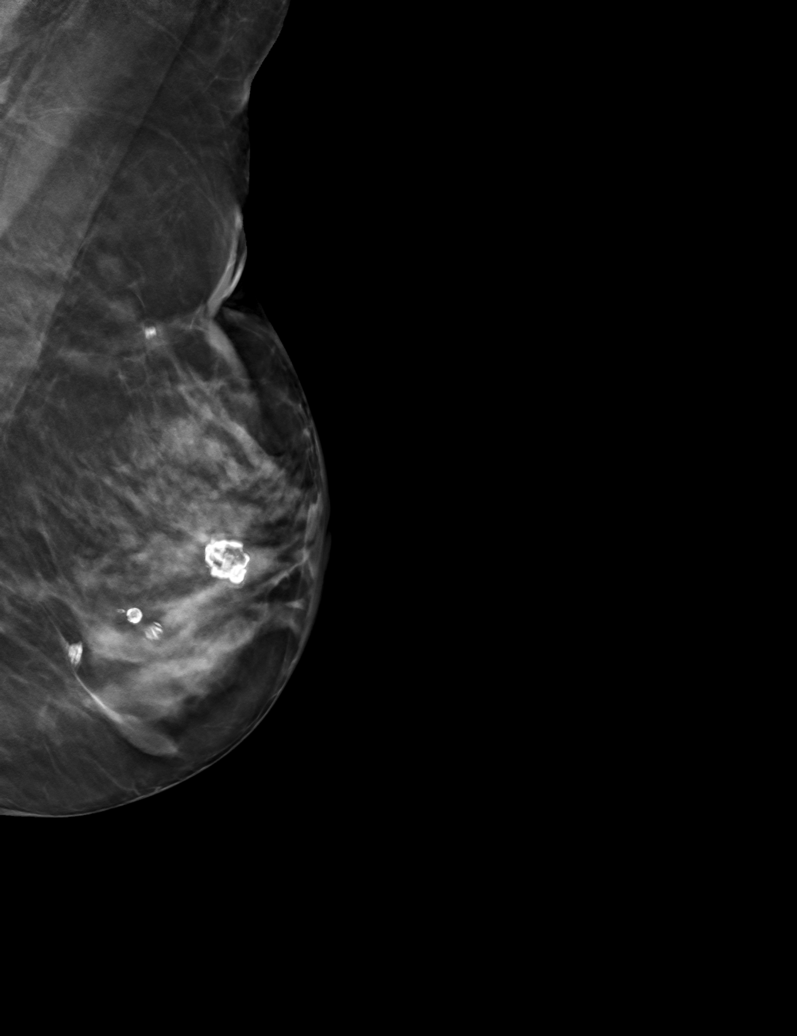

[6 of 30 positions shown; findings below may reference images not displayed]

ACR Breast Density Category c: The breast tissue is heterogeneously
dense, which may obscure small masses.
FINDINGS: There are no findings suspicious for malignancy.
IMPRESSION: No mammographic evidence of malignancy. A result letter of this
screening mammogram will be mailed directly to the patient.

RECOMMENDATION:
Screening mammogram in one year. (Code:Q3-W-BC3)

BI-RADS CATEGORY  1: Negative.
